# Patient Record
Sex: Female | Born: 1940 | Race: White | Hispanic: No | Smoking: Never smoker
Health system: Southern US, Community
[De-identification: ages and names within clinical notes are randomized; demographics above are authoritative.]

## PROBLEM LIST (undated history)

## (undated) DIAGNOSIS — M549 Dorsalgia, unspecified: Secondary | ICD-10-CM

## (undated) DIAGNOSIS — M353 Polymyalgia rheumatica: Secondary | ICD-10-CM

## (undated) DIAGNOSIS — F32A Depression, unspecified: Secondary | ICD-10-CM

## (undated) DIAGNOSIS — M1990 Unspecified osteoarthritis, unspecified site: Secondary | ICD-10-CM

## (undated) DIAGNOSIS — F319 Bipolar disorder, unspecified: Secondary | ICD-10-CM

## (undated) DIAGNOSIS — H04129 Dry eye syndrome of unspecified lacrimal gland: Secondary | ICD-10-CM

## (undated) DIAGNOSIS — R87619 Unspecified abnormal cytological findings in specimens from cervix uteri: Secondary | ICD-10-CM

## (undated) DIAGNOSIS — H9190 Unspecified hearing loss, unspecified ear: Secondary | ICD-10-CM

## (undated) DIAGNOSIS — I10 Essential (primary) hypertension: Secondary | ICD-10-CM

## (undated) DIAGNOSIS — F1910 Other psychoactive substance abuse, uncomplicated: Secondary | ICD-10-CM

## (undated) DIAGNOSIS — B009 Herpesviral infection, unspecified: Secondary | ICD-10-CM

## (undated) DIAGNOSIS — F329 Major depressive disorder, single episode, unspecified: Secondary | ICD-10-CM

## (undated) DIAGNOSIS — F419 Anxiety disorder, unspecified: Secondary | ICD-10-CM

## (undated) DIAGNOSIS — M810 Age-related osteoporosis without current pathological fracture: Secondary | ICD-10-CM

## (undated) HISTORY — DX: Age-related osteoporosis without current pathological fracture: M81.0

## (undated) HISTORY — PX: ABDOMINAL HYSTERECTOMY: SHX81

## (undated) HISTORY — DX: Herpesviral infection, unspecified: B00.9

## (undated) HISTORY — PX: TOTAL VAGINAL HYSTERECTOMY: SHX2548

## (undated) HISTORY — PX: FACIAL COSMETIC SURGERY: SHX629

## (undated) HISTORY — DX: Depression, unspecified: F32.A

## (undated) HISTORY — DX: Major depressive disorder, single episode, unspecified: F32.9

## (undated) HISTORY — PX: COLONOSCOPY: SHX174

## (undated) HISTORY — DX: Unspecified abnormal cytological findings in specimens from cervix uteri: R87.619

## (undated) HISTORY — DX: Polymyalgia rheumatica: M35.3

## (undated) HISTORY — DX: Essential (primary) hypertension: I10

## (undated) HISTORY — DX: Unspecified osteoarthritis, unspecified site: M19.90

## (undated) HISTORY — DX: Dry eye syndrome of unspecified lacrimal gland: H04.129

---

## 1998-06-16 ENCOUNTER — Ambulatory Visit (HOSPITAL_COMMUNITY): Admission: RE | Admit: 1998-06-16 | Discharge: 1998-06-16 | Payer: Self-pay | Admitting: Obstetrics and Gynecology

## 1999-01-20 ENCOUNTER — Encounter: Payer: Self-pay | Admitting: Obstetrics and Gynecology

## 1999-01-20 ENCOUNTER — Ambulatory Visit (HOSPITAL_COMMUNITY): Admission: RE | Admit: 1999-01-20 | Discharge: 1999-01-20 | Payer: Self-pay | Admitting: Obstetrics and Gynecology

## 1999-02-10 ENCOUNTER — Ambulatory Visit (HOSPITAL_COMMUNITY): Admission: RE | Admit: 1999-02-10 | Discharge: 1999-02-10 | Payer: Self-pay | Admitting: Gastroenterology

## 1999-07-21 ENCOUNTER — Encounter: Payer: Self-pay | Admitting: Obstetrics and Gynecology

## 1999-07-21 ENCOUNTER — Ambulatory Visit (HOSPITAL_COMMUNITY): Admission: RE | Admit: 1999-07-21 | Discharge: 1999-07-21 | Payer: Self-pay | Admitting: Obstetrics and Gynecology

## 1999-12-09 ENCOUNTER — Encounter: Payer: Self-pay | Admitting: Obstetrics and Gynecology

## 1999-12-09 ENCOUNTER — Encounter: Admission: RE | Admit: 1999-12-09 | Discharge: 1999-12-09 | Payer: Self-pay | Admitting: Obstetrics and Gynecology

## 2000-07-21 ENCOUNTER — Encounter: Payer: Self-pay | Admitting: Obstetrics and Gynecology

## 2000-07-21 ENCOUNTER — Ambulatory Visit (HOSPITAL_COMMUNITY): Admission: RE | Admit: 2000-07-21 | Discharge: 2000-07-21 | Payer: Self-pay | Admitting: Obstetrics and Gynecology

## 2001-01-29 ENCOUNTER — Other Ambulatory Visit: Admission: RE | Admit: 2001-01-29 | Discharge: 2001-01-29 | Payer: Self-pay | Admitting: Obstetrics and Gynecology

## 2001-07-27 ENCOUNTER — Ambulatory Visit (HOSPITAL_COMMUNITY): Admission: RE | Admit: 2001-07-27 | Discharge: 2001-07-27 | Payer: Self-pay | Admitting: Obstetrics and Gynecology

## 2001-07-27 ENCOUNTER — Encounter: Payer: Self-pay | Admitting: Obstetrics and Gynecology

## 2001-12-07 ENCOUNTER — Encounter: Payer: Self-pay | Admitting: Obstetrics and Gynecology

## 2001-12-07 ENCOUNTER — Encounter: Admission: RE | Admit: 2001-12-07 | Discharge: 2001-12-07 | Payer: Self-pay | Admitting: Obstetrics and Gynecology

## 2002-07-29 ENCOUNTER — Encounter: Payer: Self-pay | Admitting: Obstetrics and Gynecology

## 2002-07-29 ENCOUNTER — Ambulatory Visit (HOSPITAL_COMMUNITY): Admission: RE | Admit: 2002-07-29 | Discharge: 2002-07-29 | Payer: Self-pay | Admitting: Endocrinology

## 2003-08-24 ENCOUNTER — Inpatient Hospital Stay (HOSPITAL_COMMUNITY): Admission: EM | Admit: 2003-08-24 | Discharge: 2003-08-26 | Payer: Self-pay | Admitting: Emergency Medicine

## 2003-08-24 ENCOUNTER — Encounter: Payer: Self-pay | Admitting: Emergency Medicine

## 2003-08-25 ENCOUNTER — Encounter: Payer: Self-pay | Admitting: Internal Medicine

## 2003-08-27 ENCOUNTER — Ambulatory Visit (HOSPITAL_COMMUNITY): Admission: RE | Admit: 2003-08-27 | Discharge: 2003-08-27 | Payer: Self-pay | Admitting: *Deleted

## 2003-08-27 ENCOUNTER — Encounter: Payer: Self-pay | Admitting: *Deleted

## 2003-11-12 ENCOUNTER — Ambulatory Visit (HOSPITAL_COMMUNITY): Admission: RE | Admit: 2003-11-12 | Discharge: 2003-11-12 | Payer: Self-pay | Admitting: Gastroenterology

## 2003-12-22 ENCOUNTER — Encounter: Admission: RE | Admit: 2003-12-22 | Discharge: 2003-12-22 | Payer: Self-pay | Admitting: Obstetrics and Gynecology

## 2004-04-01 ENCOUNTER — Other Ambulatory Visit: Admission: RE | Admit: 2004-04-01 | Discharge: 2004-04-01 | Payer: Self-pay | Admitting: Obstetrics and Gynecology

## 2004-09-06 ENCOUNTER — Encounter: Admission: RE | Admit: 2004-09-06 | Discharge: 2004-09-06 | Payer: Self-pay | Admitting: Obstetrics and Gynecology

## 2005-10-31 ENCOUNTER — Encounter: Admission: RE | Admit: 2005-10-31 | Discharge: 2005-10-31 | Payer: Self-pay | Admitting: Obstetrics and Gynecology

## 2005-11-10 ENCOUNTER — Encounter: Admission: RE | Admit: 2005-11-10 | Discharge: 2005-11-10 | Payer: Self-pay | Admitting: Obstetrics and Gynecology

## 2006-11-06 ENCOUNTER — Encounter: Admission: RE | Admit: 2006-11-06 | Discharge: 2006-11-06 | Payer: Self-pay | Admitting: Obstetrics & Gynecology

## 2007-02-09 ENCOUNTER — Encounter: Admission: RE | Admit: 2007-02-09 | Discharge: 2007-02-09 | Payer: Self-pay | Admitting: *Deleted

## 2007-11-09 ENCOUNTER — Encounter: Admission: RE | Admit: 2007-11-09 | Discharge: 2007-11-09 | Payer: Self-pay | Admitting: Obstetrics and Gynecology

## 2008-01-02 ENCOUNTER — Other Ambulatory Visit: Admission: RE | Admit: 2008-01-02 | Discharge: 2008-01-02 | Payer: Self-pay | Admitting: Obstetrics and Gynecology

## 2008-01-24 ENCOUNTER — Encounter: Admission: RE | Admit: 2008-01-24 | Discharge: 2008-01-24 | Payer: Self-pay | Admitting: Obstetrics and Gynecology

## 2008-11-10 ENCOUNTER — Encounter: Admission: RE | Admit: 2008-11-10 | Discharge: 2008-11-10 | Payer: Self-pay | Admitting: Internal Medicine

## 2008-11-14 ENCOUNTER — Encounter: Admission: RE | Admit: 2008-11-14 | Discharge: 2008-11-14 | Payer: Self-pay | Admitting: Internal Medicine

## 2009-11-11 ENCOUNTER — Encounter: Admission: RE | Admit: 2009-11-11 | Discharge: 2009-11-11 | Payer: Self-pay | Admitting: Internal Medicine

## 2010-11-12 ENCOUNTER — Encounter: Admission: RE | Admit: 2010-11-12 | Discharge: 2010-11-12 | Payer: Self-pay | Admitting: Obstetrics and Gynecology

## 2011-01-16 ENCOUNTER — Encounter: Payer: Self-pay | Admitting: Internal Medicine

## 2011-05-13 NOTE — Discharge Summary (Signed)
NAME:  Christine Scott, Christine Scott                           ACCOUNT NO.:  [id number]   MEDICAL RECORD NO.:  [id number]                   PATIENT TYPE:  INP   LOCATION:  A204                                 FACILITY:  [facility]   PHYSICIAN:  Jackie Plum, M.D.             DATE OF BIRTH:  Jul 28, 1941   DATE OF ADMISSION:  08/24/2003  DATE OF DISCHARGE:                                 DISCHARGE SUMMARY   DISCHARGE DIAGNOSES:  1. Chest pain, resolved.     a. Serial cardiac enzymes negative for myocardial infarction.     b. A 2-D echocardiogram done on August 25, 2003 notable for normal size        left ventricle with normal systolic function, mild decrease in left        ventricular diastolic function, with evidence of E/A reversal in the        in the mitral outflow.  No wall motion abnormality.  No left        ventricular hypertrophy.  Normal right ventricular size with normal        systolic function.  Trace mitral insufficiency.  Normal pericardial        structures without any pericardial effusion.  Normal ascending aorta.     c. CT of the chest was negative for pulmonary embolus.  Carotid Doppler        was also unremarkable.     d. The patient is scheduled for outpatient Cardiolite on August 27, 2003 at 7:45 a.m.  Cardiology consult, Dr. Dorethea Clan, has given the        patient appropriate instructions regarding this test and she expresses        understanding including not to eat or drink after midnight tonight.  2. History of depression.  3. Osteoporosis.   REASON FOR ADMISSION:  Chest pain.   On the day of hospitalization the patient woke up with left-sided chest pain  which was radiating to the neck and left side of the back and left upper  extremity.  The patient passed out while she was in the bathroom for a few  seconds on the day of admission thereafter.  There was no history of nausea,  vomiting, or diaphoresis; however, she felt some slight shortness of breath.  DD, EKG,  and cardiac enzymes were unremarkable.  Admitting physical exam was  notable for hemodynamically stability with chest clear to auscultation and a  normal cardiac exam.  She did not have any extremity edema.  On account of  this, the patient was admitted for further evaluation of chest pain to rule  out myocardial infarction and further evaluation of her syncope.   HOSPITAL COURSE:  CHEST PAIN/SYNCOPE.  The patient was admitted to telemetry  bed.  There were no significant arrhythmias.  CT of the chest was done on  account of pleuritic component to her  chest pain which was unremarkable.  She had a carotid Doppler as part of her evaluation for her associated  syncope and it was negative.  Serial cardiac enzymes were negative for  myocardial infarction.  Cardiology was consulted and the patient was seen by  Dr. Dorethea Clan yesterday, and the patient was planned to be scheduled for a  stress test this morning; however, the patient ate and this has been  rescheduled.  The patient wants to go home today and cardiology and myself  have explained the risks of incomplete workup in the hospital and she  expresses understanding.   The cause of the patient's syncopal episode may be vasovagal reaction to her  pain possibly, but I am not sure.  No further evaluation at this point.  Follow-up with stress test as mentioned tomorrow.   CONSULTANTS:  Vida Roller, M.D. of cardiology.   PROCEDURES:  Not applicable.   CONDITION ON DISCHARGE:  Improved and stable.   DIET:  Cardiac diet.   DISCHARGE LABORATORY DATA:  Wbc 6.4, hemoglobin 11.9, hematocrit 35.9, MCV  87.1, platelet count 203.  ESR 221.  D-dimer 0.34.  Sodium 134, potassium  3.8, chloride 102, CO2 30, glucose 97, BUN 10, creatinine 0.8, calcium 8.9.  TSH 2.571.  Rheumatoid factor less than 20.   DISCHARGE MEDICATIONS:  1. Aspirin 81 p.o. daily.  2. Effexor 75 p.o. daily.  3. Protonix 40 daily.  4. Vicodin q.6h. p.r.n.  5. Ambien 5 mg  q.h.s. p.r.n.  6. Sublingual nitroglycerin p.r.n.                                               Jackie Plum, M.D.    GO/MEDQ  D:  08/26/2003  T:  08/26/2003  Job:  [id number]   cc:   Hanley Hays. Dechurch, M.D.  [address]  [place]  [place] [address]  Fax: [phone]

## 2011-05-13 NOTE — Consult Note (Signed)
NAME:  Christine Scott, Christine Scott NO.:  [id number]   MEDICAL RECORD NO.:  [id number]                   PATIENT TYPE:  INP   LOCATION:  A204                                 FACILITY:  [facility]   PHYSICIAN:  Vida Roller, M.D.                DATE OF BIRTH:  May 17, 1941   DATE OF CONSULTATION:  08/25/2003  DATE OF DISCHARGE:                                   CONSULTATION   CARDIOLOGY CONSUL ATION   PRIMARY CARE Andreyah Natividad:  Darius Bump[place]  She has no cardiology evaluations.   [name] is a 70 year old female with no significant past medical history  other than osteoporosis and depression.  She presented to the [facility] ER  with chest discomfort yesterday.  She states that the pain started about  7:15 in the morning and awoke her from sleep.  It was associated with mild  shortness of breath, localized in her left chest, very sharp and worsened  with deep inspiration.  The pain progressively got worse over the next 4-5  hours, to the point, where it was so severe she self-medicated herself with  Pepto-Bismol thinking that it was gastroesophageal reflux disease.  She went  into the bathroom. She had an episode of syncope witnessed by her husband  and was down for about 15 seconds.  She states that she started to see the  room start to close in on her.  Things got dark, she got sweaty, and then  she passed out.  Her husband laid her down on the bed, and awakened her from  the episode.  She was not postictal afterwards.  She was not incontinent to  bowel or bladder.  She did not have any tongue biting.  There was no  tonic/clonic movements associated with it and she was not postictal  afterwards able to speak in full sentences and moved all extremities.  She  was taken to the emergency department.  She got some sublingual  nitroglycerin which relieved the pain somewhat.  I believe that she also  received some other pain medication,  although it is not well documented.  She still has mild, nagging, left-sided discomfort which is worse when she  takes a deep breath.   PAST MEDICAL HISTORY:  Her past medical history is significant for  osteoporosis.   MEDICATIONS AT HOME:  She is previously on a medication that caused her to  get gastroesophageal reflux disease.  She was switched over, about 5 or 6  weeks ago, to Evista. She also has depression and she is status post a  hysterectomy about 35 years ago.  She was previously taking Celexa 20 mg a  day, and Evista 60 mg a day.   MEDICATIONS IN THE HOSPITAL:  1. Aspirin 81 mg.  2. Protonix 40 mg a day.  3. Evista 60 mg a day.  4. Effexor 75 mg a day.  5. Nitroglycerin as she needs it.  6. Lortab as she needs it.   SOCIAL HISTORY:  She lives in [place].  She is retired from the [employer]. She is married.  She lives with her husband.  She  has 2 daughters and 2 grandchildren all of whom are healthy.  Her mother  died at age 9 from complications of a hip fracture.  Father died at age 37  of a stroke.  She has 1 brother who had a cardiac event in his 80s and 2  other brothers with diabetes.   REVIEW OF SYSTEMS:  Her review of systems is essentially unremarkable except  for some mild left wrist pain.  She has no lower extremity pain.  She has  had no prolonged car travel.  She has had no lower extremity injuries.  She  does not have a blood clot history.   PHYSICAL EXAMINATION:  GENERAL: On physical exam she is a well-developed,  well-nourished, thin, white female in no apparent distress.  She is alert  and oriented x4.  VITAL SIGNS:  Blood pressure is 134/73.  Heart rate is 74 and sinus.  Respiratory rate is 18.  She is afebrile.  HEAD, EYES, EARS, NOSE, AND THROAT: Examination is unremarkable.  NECK:  The neck is supple.  There is no jugular venous distention or carotid  bruits.  CHEST:  Her chest is clear to auscultation.  She does have  some pleuritic  discomfort in her left chest when she takes a deep breath, but I hear no  pleural or pericardial friction rubs.  CARDIAC: Her heart sounds are easily heard.  There is no evidence of a third  or fourth heart sound.  She is regular. There is no murmur noted.  ABDOMEN:  Her abdomen is soft, nontender. Normoactive bowel sounds.  EXTREMITIES:  Lower extremities are without clubbing, cyanosis, or edema.  There is no Homan's sign.  Her pulses are 2+ throughout.  MUSCULOSKELETAL: Without significant deformities.  NEUROLOGIC: Without significant localization.   Electrocardiogram shows sinus rhythm at a rate of 79 with normal axes,  normal intervals, no ST-T wave changes concerning for ischemia. No Q waves  concerning for an old myocardial infarction.   LABORATORIES:  White blood cell count of 6.4, H&H of 12 and 36 with a  platelet count of 203,000.  Sodium of 137, potassium of 3.8, chloride of  102, bicarb of 30, BUN of 10, creatinine of 0.8, blood sugar of 97,  nonfasting.  Liver function studies are within normal limits.  Three sets of  cardiac enzymes are inconsistent with anterior myocardial infarction.  She  has a D-dimmer which shows 0.34.  Two liter blood gas shows a pH of 7.47,  pCO2 of 42, pO2 of 74, 95% saturation.   IMPRESSION:  So, we have a woman with chest discomfort which is very  atypical for coronary artery disease, but rather concerning for pulmonary  embolus.  A review of the literature shows that Evista actually is related  to deep venous thrombosis in recent clinical studies and may be one of the  most significant risk factors for using the drug.  She does have a family  history of coronary artery disease, but no other risk factors. She does not  smoke.  Her D-dimmer is abnormal, but not particularly so.  She has had a  set of carotid Dopplers which were reported to me as normal.  So,  I think, that it would be reasonable, at this point, to do an   echocardiogram to assess her left and right ventricular size. A spiral CT  has been ordered and I could not agree more with that recommendation.  Her  enzymes have been cycled.  She has not had an acute myocardial infarction or  electrocardiogram consistent with ischemia.  I would encourage the CT scan  to also be reviewed for possible aortic dissection. I think that if we run  out of diagnoses it might be reasonable to walk her on a treadmill with  imaging.  But, at this point that does not seem to be a likely thing.  I  will order that for the morning, but if the CT scan shows a pulmonary  embolus we can obviously cancel  that; it might also be reasonable.  I do not see a chest x-ray anywhere in  the chart.  I am sure that it was done.  I will need to review that, and  also there is consideration for starting anticoagulation. I think I would  have a low index of suspicion.  I would probably start her on Lovenox and  await base dose.                                               Vida Roller, M.D.    JH/MEDQ  D:  08/25/2003  T:  08/25/2003  Job:  [id number]

## 2011-05-13 NOTE — H&P (Signed)
NAME:  Christine Scott, Christine Scott                           ACCOUNT NO.:  [id number]   MEDICAL RECORD NO.:  [id number]                   PATIENT TYPE:  INP   LOCATION:  A204                                 FACILITY:  [facility]   PHYSICIAN:  Tesfaye D. Felecia Shelling, M.D.              DATE OF BIRTH:  06/22/1941   DATE OF ADMISSION:  08/24/2003  DATE OF DISCHARGE:                                HISTORY & PHYSICAL   PRIMARY CARE PHYSICIAN:  Darius Bump, M.D.   CHIEF COMPLAINT:  Chest pain.   HISTORY OF PRESENT ILLNESS:  This is a 70 year old white female with a  history of osteoporosis and depression disorder.  She came to the emergency  room with the above complaint.  The patient woke up this morning with left-  sided chest pain which was radiating to the neck, left side of the back and  the left upper extremity.  The pain was deep seated and persistent.  The  pain got worse on deep inspiration.  The patient thought that she had gas  pain and tried to take some Pepto-Bismol. However, the pain was not relieved  with the medication.  The patient passed out while she was in the bathroom  for a few seconds.  Then EMS was called and the patient was brought to the  emergency room.  The patient had no nausea, vomiting, or diaphoresis.  However, she felt slightly short of breath.  She got better after she was  given nitroglycerin and pain medication.  EKG and cardiac enzymes that were  done in the emergency room were within normal limits. However, the patient  was admitted under telemetry for further evaluation.   REVIEW OF SYSTEMS:  No headache, fever, cough, or nausea, or vomiting.  No  dysuria, urgency, or frequency of urination.   PAST MEDICAL HISTORY:  1. Osteoporosis.  2. Depression disorder.  3. Hysterectomy at the age of 16.   CURRENT MEDICATIONS:  1. Celexa 20 mg p.o. daily.  2. Evista 60 mg p.o. daily.   SOCIAL HISTORY:  The patient is married.  She is currently retired from  [employer]er, [employer].  The patient has no history of alcohol,  tobacco or substance abuse.   PHYSICAL EXAMINATION:  GENERAL: The patient is alert, awake, and pleasant  looking.  VITAL SIGNS:  Blood pressure 141/75, pulse 67, respiratory rate 18,  temperature 98 degrees Fahrenheit.  HEENT:  Pupils are equal and reactive.  NECK:  Supple.  CHEST:  Good air entry.  Clear lung fields.  CARDIOVASCULAR:  First and second heart sounds heard, no murmur, no gallop.  ABDOMEN:  Soft and lax. Bowel sounds are positive.  No masses, no  organomegaly.  EXTREMITIES:  No leg edema.   LABS ON ADMISSION:  ABG on room air pH 7.416, pCO2 of 42.4, pO2 of 74,  bicarbonate 26.7, and O2 saturation 95.2.  CBC: WBC  6.4, hemoglobin 11.9,  hematocrit 35.9, platelets 203.  Sodium 137, potassium 3.8, chloride 102,  CO2 30, glucose 97, BUN 10, creatinine 0.8, calcium 8.9.  CPK total 35,  CK/MB 1.2, and troponin 0.02.   ASSESSMENT:  1. Chest pain to rule out unstable angina.  2. Syncopal episode.  3. History of osteoporosis.  4. History of depression disorder.   PLAN:  1. Will do an EKG and cardiac enzymes x3.  2. Will continue the patient on telemetry.  3. Will do carotid Doppler.  4. Will do cardiology consult.  5. Will continue the patient on her regular medications.                                               Tesfaye D. Felecia Shelling, M.D.    TDF/MEDQ  D:  08/24/2003  T:  08/25/2003  Job:  [id number]

## 2011-05-13 NOTE — Procedures (Signed)
   NAME:  Christine Scott, Christine Scott                           ACCOUNT NO.:  [id number]   MEDICAL RECORD NO.:  [id number]                   PATIENT TYPE:  INP   LOCATION:  A204                                 FACILITY:  [facility]   PHYSICIAN:  Vida Roller, M.D.                DATE OF BIRTH:  1941-04-10   DATE OF PROCEDURE:  08/25/2003  DATE OF DISCHARGE:                                  ECHOCARDIOGRAM   TAPE NUMBER:  [id number]   TAPE COUNT:  [id number]   CLINICAL INFORMATION:  A 70 year old woman with chest pain, rule out  pericardial effusion.   TECHNICAL QUALITY:  Good.   M-MODE TRACINGS:  1. The aorta is 30 mm.  2. The left atrium is 42 mm.  3. The septum is 8 mm.  4. The posterior wall is 8 mm.  5. The left ventricular diastolic dimension is 50 mm.  6. The left ventricular systolic dimension is 30 mm.   2-D AND DOPPLER IMAGING:  1. The left ventricle is normal size with normal systolic function.  There     is mild decrement in left ventricular diastolic function with evidence of     E:A reversal in the mitral inflow.  There is also evidence of blunting of     the D wave in the pulmonary venous tracings consistent with grade 2     relaxation abnormality.  The wall motion is normal.  There is no evidence     of left ventricular hypertrophy.  2. The right ventricle is normal size with normal systolic function.  There     are no wall motion abnormalities seen.  3. Both atria appear to be top end of normal.  Left slightly greater than     right.  There is no obvious atrioseptal defect.  4. The aortic valve is trileaflet and tricommisural with no evidence of     stenosis or regurgitation.  5. The mitral valve is morphologically unremarkable with trace     insufficiency.  No stenosis is seen.  6. The tricuspid valve is morphologically unremarkable with no stenosis or     regurgitation.  7. The pulmonic valve is not well seen.  8. The pericardial structures are normal.  There is no pericardial  effusion.  9. The ascending aorta appears normal.  10.      The inferior vena cava appears normal.                                               Vida Roller, M.D.   JH/MEDQ  D:  08/25/2003  T:  08/25/2003  Job:  [id number]

## 2011-05-13 NOTE — Op Note (Signed)
NAME:  Christine Scott, Christine Scott                           ACCOUNT NO.:  [id number]   MEDICAL RECORD NO.:  [id number]                   PATIENT TYPE:  AMB   LOCATION:  ENDO                                 FACILITY:  [facility]   PHYSICIAN:  Danise Edge, M.D.                DATE OF BIRTH:  1941-09-24   DATE OF PROCEDURE:  11/12/2003  DATE OF DISCHARGE:                                 OPERATIVE REPORT   PROCEDURE:  Screening colonoscopy.   PROCEDURE INDICATION:  Christine Scott is a 71 year old female born Apr 30, 1941.  Christine Scott' brother was diagnosed with colon cancer in his 3s.  Christine Scott  underwent a screening colonoscopy in February 2000 which was normal.   ENDOSCOPIST:  Danise Edge, M.D.   PREMEDICATION:  Versed 5 mg, Demerol 40 mg.   DESCRIPTION OF PROCEDURE:  After obtaining informed consent, Christine Scott was  placed in the left lateral decubitus position.  I administered intravenous  Demerol and intravenous Versed to achieve conscious sedation for the  procedure.  The patient's blood pressure, oxygen saturation, and cardiac  rhythm were monitored throughout the procedure and documented in the medical  record.   Anal inspection was normal.  Digital rectal exam was normal.  The Olympus  adjustable pediatric colonoscope was introduced into the rectum and advanced  to the cecum.  Colonic preparation for the exam today was satisfactory.   Rectum normal.   Sigmoid colon and descending colon normal.   Splenic flexure normal.   Transverse colon normal.   Hepatic flexure normal.   Ascending colon normal.   Cecum and ileocecal valve normal.   ASSESSMENT:  Normal screening proctocolonoscopy to the cecum.   RECOMMENDATIONS:  Repeat colonoscopy in five years.                                               Danise Edge, M.D.    MJ/MEDQ  D:  11/12/2003  T:  11/12/2003  Job:  [id number]   cc:   Darius Bump, M.D.  [address]  [place] [address]  Fax: [phone]

## 2011-10-11 ENCOUNTER — Other Ambulatory Visit: Payer: Self-pay | Admitting: Internal Medicine

## 2011-10-12 ENCOUNTER — Other Ambulatory Visit: Payer: Self-pay | Admitting: Internal Medicine

## 2011-10-12 DIAGNOSIS — Z1231 Encounter for screening mammogram for malignant neoplasm of breast: Secondary | ICD-10-CM

## 2011-11-14 ENCOUNTER — Ambulatory Visit
Admission: RE | Admit: 2011-11-14 | Discharge: 2011-11-14 | Disposition: A | Discharge status: Home / Self Care | Payer: Federal, State, Local not specified - PPO | Source: Ambulatory Visit | Attending: Internal Medicine | Admitting: Internal Medicine

## 2011-11-14 DIAGNOSIS — Z1231 Encounter for screening mammogram for malignant neoplasm of breast: Secondary | ICD-10-CM

## 2011-11-21 ENCOUNTER — Other Ambulatory Visit: Payer: Self-pay | Admitting: Internal Medicine

## 2011-11-21 DIAGNOSIS — R928 Other abnormal and inconclusive findings on diagnostic imaging of breast: Secondary | ICD-10-CM

## 2011-12-09 ENCOUNTER — Ambulatory Visit
Admission: RE | Admit: 2011-12-09 | Discharge: 2011-12-09 | Disposition: A | Discharge status: Home / Self Care | Payer: Federal, State, Local not specified - PPO | Source: Ambulatory Visit | Attending: Internal Medicine | Admitting: Internal Medicine

## 2011-12-09 DIAGNOSIS — R928 Other abnormal and inconclusive findings on diagnostic imaging of breast: Secondary | ICD-10-CM

## 2012-10-02 ENCOUNTER — Other Ambulatory Visit: Payer: Self-pay | Admitting: Internal Medicine

## 2012-10-02 DIAGNOSIS — Z1231 Encounter for screening mammogram for malignant neoplasm of breast: Secondary | ICD-10-CM

## 2012-11-14 ENCOUNTER — Ambulatory Visit
Admission: RE | Admit: 2012-11-14 | Discharge: 2012-11-14 | Disposition: A | Discharge status: Home / Self Care | Payer: Federal, State, Local not specified - PPO | Source: Ambulatory Visit | Attending: Internal Medicine | Admitting: Internal Medicine

## 2012-11-14 DIAGNOSIS — Z1231 Encounter for screening mammogram for malignant neoplasm of breast: Secondary | ICD-10-CM

## 2012-11-15 ENCOUNTER — Other Ambulatory Visit: Payer: Self-pay | Admitting: Obstetrics and Gynecology

## 2012-11-15 DIAGNOSIS — N63 Unspecified lump in unspecified breast: Secondary | ICD-10-CM

## 2012-11-30 ENCOUNTER — Ambulatory Visit
Admission: RE | Admit: 2012-11-30 | Discharge: 2012-11-30 | Disposition: A | Discharge status: Home / Self Care | Payer: Federal, State, Local not specified - PPO | Source: Ambulatory Visit | Attending: Obstetrics and Gynecology | Admitting: Obstetrics and Gynecology

## 2012-11-30 DIAGNOSIS — N63 Unspecified lump in unspecified breast: Secondary | ICD-10-CM

## 2014-12-26 HISTORY — PX: BREAST SURGERY: SHX581

## 2015-03-02 ENCOUNTER — Other Ambulatory Visit: Payer: Self-pay

## 2015-03-02 DIAGNOSIS — Z1231 Encounter for screening mammogram for malignant neoplasm of breast: Secondary | ICD-10-CM

## 2015-03-09 ENCOUNTER — Ambulatory Visit
Admission: RE | Admit: 2015-03-09 | Discharge: 2015-03-09 | Disposition: A | Discharge status: Home / Self Care | Payer: Federal, State, Local not specified - PPO | Source: Ambulatory Visit

## 2015-03-09 DIAGNOSIS — Z1231 Encounter for screening mammogram for malignant neoplasm of breast: Secondary | ICD-10-CM

## 2015-03-18 ENCOUNTER — Encounter: Payer: Self-pay | Admitting: Certified Nurse Midwife

## 2015-03-18 ENCOUNTER — Ambulatory Visit (INDEPENDENT_AMBULATORY_CARE_PROVIDER_SITE_OTHER): Payer: Federal, State, Local not specified - PPO | Admitting: Certified Nurse Midwife

## 2015-03-18 VITALS — BP 130/74 | HR 80 | Resp 20 | Ht 61.5 in | Wt 118.0 lb

## 2015-03-18 DIAGNOSIS — N952 Postmenopausal atrophic vaginitis: Secondary | ICD-10-CM | POA: Diagnosis not present

## 2015-03-18 DIAGNOSIS — Z01419 Encounter for gynecological examination (general) (routine) without abnormal findings: Secondary | ICD-10-CM | POA: Diagnosis not present

## 2015-03-18 MED ORDER — ESTROGENS, CONJUGATED 0.625 MG/GM VA CREA: 1.0000 | TOPICAL_CREAM | Freq: Every day | VAGINAL | Status: DC

## 2015-03-18 NOTE — Patient Instructions (Signed)

## 2015-03-18 NOTE — Progress Notes (Signed)
74 y.o. G52P2002 Widowed  Caucasian Fe here for annual exam. Menopausal  No HRT. Denies vaginal bleeding. Using premarin cream for vaginal dryness, working well. Ambulates well, still driving without difficulty.   Sees PCP for medication management, labs, all normal per patient.Rhuematology does other labs also and manages osteoporosis.. No other health issues today. Going to [place] and [place] this summer and will have visited all 50 states!!   No LMP recorded. Patient has had a hysterectomy.          Sexually active: No.  The current method of family planning is status post hysterectomy.    Exercising: Yes.    walking Smoker:  no  Health Maintenance: Pap:  01-29-01 neg MMG:  03-09-15 category b density,birads 1:neg Colonoscopy:  2008, scheduled for another BMD:   2015 osteoporosis using Prolia every 6 months with Rhuematology TDaP: 2011 Labs: none Self breast exam: done every 6 weeks   reports that she has never smoked. She does not have any smokeless tobacco history on file. She reports that she drinks about 3.6 oz of alcohol per week. She reports that she does not use illicit drugs.  Past Medical History  Diagnosis Date  . Depression   . Osteoporosis   . Hypertension   . Polymyalgia rheumatica   . Osteoarthritis   . Dry eye     Past Surgical History  Procedure Laterality Date  . Facial cosmetic surgery    . Total vaginal hysterectomy      Current Outpatient Prescriptions  Medication Sig Dispense Refill  . B Complex-Biotin-FA (B-COMPLEX PO) Take by mouth daily.    . Calcium Carbonate-Vitamin D (CALCIUM + D PO) Take by mouth.    . Cholecalciferol (VITAMIN D PO) Take 10,000 Int'l Units by mouth daily.    Marland Kitchen conjugated estrogens (PREMARIN) vaginal cream Place 1 Applicatorful vaginally daily.    . cycloSPORINE (RESTASIS) 0.05 % ophthalmic emulsion 1 drop 2 (two) times daily.    Marland Kitchen denosumab (PROLIA) 60 MG/ML SOLN injection Inject 60 mg into the skin every 6 (six) months. Administer  in upper arm, thigh, or abdomen    . lamoTRIgine (LAMICTAL) 100 MG tablet     . Levomilnacipran HCl ER 80 MG CP24     . LORazepam (ATIVAN) 1 MG tablet     . metoprolol succinate (TOPROL-XL) 50 MG 24 hr tablet Take 50 mg by mouth.    . Multiple Vitamins-Minerals (MULTIVITAMIN PO) Take by mouth.    . mupirocin ointment (BACTROBAN) 2 %     . Omega-3 Fatty Acids (FISH OIL PO) Take by mouth.     No current facility-administered medications for this visit.    Family History  Problem Relation Age of Onset  . Colon cancer Daughter   . Congestive Heart Failure Mother   . Stroke Mother   . Diabetes Father   . Colon cancer Brother   . Diabetes Brother   . Diabetes Brother   . Stroke Brother     ROS:  Pertinent items are noted in HPI.  Otherwise, a comprehensive ROS was negative.  Exam:   BP 130/74 mmHg  Pulse 80  Resp 20  Ht 5' 1.5" (1.562 m)  Wt 118 lb (53.524 kg)  BMI 21.94 kg/m2 Height: 5' 1.5" (156.2 cm) Ht Readings from Last 3 Encounters:  03/18/15 5' 1.5" (1.562 m)    General appearance: alert, cooperative and appears stated age Head: Normocephalic, without obvious abnormality, atraumatic Neck: no adenopathy, supple, symmetrical, trachea midline and thyroid  normal to inspection and palpation Lungs: clear to auscultation bilaterally Breasts: normal appearance, no masses or tenderness, No nipple retraction or dimpling, No nipple discharge or bleeding, No axillary or supraclavicular adenopathy Heart: regular rate and rhythm Abdomen: soft, non-tender; no masses,  no organomegaly Extremities: extremities normal, atraumatic, no cyanosis or edema Skin: Skin color, texture, turgor normal. No rashes or lesions Lymph nodes: Cervical, supraclavicular, and axillary nodes normal. No abnormal inguinal nodes palpated Neurologic: Grossly normal   Pelvic: External genitalia:  no lesions              Urethra:  normal appearing urethra with no masses, tenderness or lesions               Bartholin's and Skene's: normal                 Vagina: atrophic appearing vagina with normal color and discharge, no lesions              Cervix: absent              Pap taken: No. Bimanual Exam:  Uterus:  uterus absent              Adnexa: normal adnexa and no mass, fullness, tenderness               Rectovaginal: Confirms               Anus:  normal sphincter tone, no lesions  Chaperone present: Yes  A:  Well Woman with normal exam  Menopausal no HRT TVH with ovaries retained for bleeding  Atrophic Vaginitis using premarin cream with good success needs refill  Osteoporosis with Prolia use with Rheumatology  Hypertension with PCP management    P:   Reviewed health and wellness pertinent to exam  Aware of need to evaluate if vaginal bleeding  Rx Premarin cream see order. Discussed Coconut oil use on other days not using Premarin. Coupon card given.  Continue with PCP as indicated  Pap smear not taken today   counseled on breast self exam, mammography screening, adequate intake of calcium and vitamin D, diet and exercise, Kegel's exercises  return annually or prn  An After Visit Summary was printed and given to the patient.

## 2015-03-25 NOTE — Progress Notes (Signed)
Reviewed personally.  M. Suzanne Avrianna Smart, MD.  

## 2015-04-17 ENCOUNTER — Other Ambulatory Visit: Payer: Self-pay | Admitting: Gastroenterology

## 2015-04-21 ENCOUNTER — Ambulatory Visit: Payer: Self-pay | Admitting: Certified Nurse Midwife

## 2015-05-26 ENCOUNTER — Telehealth: Payer: Self-pay | Admitting: Obstetrics and Gynecology

## 2015-05-26 NOTE — Telephone Encounter (Signed)
Patient wanting to know when she's due for a bone density scan.

## 2015-05-26 NOTE — Telephone Encounter (Signed)
Spoke with patient. Advised per last OV note on 03/18/2015 with Verner Choleborah S. Leonard CNM  last BMD was performed in 2015. Advised will be due again in 2017. Patient is agreeable. Patient requesting month of imaging. Advised it appears she has been having these performed in November. Patient is agreeable.  Routing to provider for final review. Patient agreeable to disposition. Will close encounter.   Patient aware provider will review message and nurse will return call if any additional advice or change of disposition.

## 2015-07-13 ENCOUNTER — Encounter (HOSPITAL_COMMUNITY): Payer: Self-pay | Admitting: *Deleted

## 2015-07-20 ENCOUNTER — Ambulatory Visit (HOSPITAL_COMMUNITY)
Admission: RE | Admit: 2015-07-20 | Payer: Federal, State, Local not specified - PPO | Source: Ambulatory Visit | Admitting: Gastroenterology

## 2015-07-20 SURGERY — COLONOSCOPY WITH PROPOFOL
Anesthesia: Monitor Anesthesia Care

## 2016-04-01 ENCOUNTER — Ambulatory Visit: Payer: Federal, State, Local not specified - PPO | Admitting: Obstetrics and Gynecology

## 2016-04-13 ENCOUNTER — Encounter: Payer: Self-pay | Admitting: Obstetrics and Gynecology

## 2016-04-13 ENCOUNTER — Ambulatory Visit (INDEPENDENT_AMBULATORY_CARE_PROVIDER_SITE_OTHER): Payer: Federal, State, Local not specified - PPO | Admitting: Obstetrics and Gynecology

## 2016-04-13 VITALS — BP 148/76 | HR 60 | Resp 16 | Ht 61.5 in | Wt 118.8 lb

## 2016-04-13 DIAGNOSIS — R8290 Unspecified abnormal findings in urine: Secondary | ICD-10-CM

## 2016-04-13 DIAGNOSIS — M545 Low back pain, unspecified: Secondary | ICD-10-CM

## 2016-04-13 DIAGNOSIS — Z01419 Encounter for gynecological examination (general) (routine) without abnormal findings: Secondary | ICD-10-CM

## 2016-04-13 DIAGNOSIS — N952 Postmenopausal atrophic vaginitis: Secondary | ICD-10-CM

## 2016-04-13 LAB — POCT URINALYSIS DIPSTICK
BILIRUBIN UA: NEGATIVE
Glucose, UA: NEGATIVE
KETONES UA: NEGATIVE
Nitrite, UA: NEGATIVE
PH UA: 5
Protein, UA: NEGATIVE
RBC UA: NEGATIVE
Urobilinogen, UA: NEGATIVE

## 2016-04-13 MED ORDER — ESTROGENS, CONJUGATED 0.625 MG/GM VA CREA: 1.0000 | TOPICAL_CREAM | Freq: Every day | VAGINAL | Status: DC

## 2016-04-13 NOTE — Patient Instructions (Signed)

## 2016-04-13 NOTE — Progress Notes (Signed)
Patient ID: Christine Scott, female   DOB: December 14, 1941, 75 y.o.   MRN: [id number] 75 y.o. G77P2004 Widowed Caucasian female here for annual exam.    Uses vaginal estrogen cream for dryness.  Not sexually active.   Had a left breast mass excision in [place] area which was benign. Has been followed for years for a mass of the left breast.  Patient states it was a fatty growth.  Asking if she has a lump in her abdominal skin.  Using Prolia for osteoporosis. Rheumatologist prescribing.  Having back pain.   Has a new female friend.  3 grandchildren live in [place].   Returned from [place].   PCP:   Jenita Seashore, MD  No LMP recorded. Patient has had a hysterectomy.           Sexually active: No.  The current method of family planning is status post hysterectomy.   Ovaries remain.  Exercising: Yes.    walking. Smoker:  no  Health Maintenance: Pap:  2002 Neg History of abnormal Pap:  Yes, hx abnormal paps in her 30s and the reason for her hysterectomy per patient. MMG:  03-09-15 Denisty B/Neg/[facility]Rads1:/[facility] Colonoscopy:   BMD:  11/2015  Result: Osteoporosis on Prolia with Rheumatologist:[facility] TDaP:  2011 Gardasil:   N/A Screening Labs:  Hb today: PCP, Urine today: 1+WBCs   reports that she has never smoked. She has never used smokeless tobacco. She reports that she drinks about 0.6 oz of alcohol per week. She reports that she does not use illicit drugs.  Past Medical History  Diagnosis Date  . Depression   . Osteoporosis   . Hypertension   . Polymyalgia rheumatica (HCC)   . Dry eye   . Osteoarthritis     hands  . Abnormal Pap smear of cervix     --hx abnormal paps in her 30s and per patient this was reason for her Hysterectomy    Past Surgical History  Procedure Laterality Date  . Facial cosmetic surgery    . Total vaginal hysterectomy  age 66    --due to abnormal pap smears per patient    Current Outpatient Prescriptions  Medication Sig Dispense Refill  .  B Complex-Biotin-FA (B-COMPLEX PO) Take 1 tablet by mouth daily.     . Calcium Carbonate-Vitamin D3 (CALCIUM 600-D) 600-400 MG-UNIT TABS Take 1 tablet by mouth 2 (two) times daily.    Marland Kitchen conjugated estrogens (PREMARIN) vaginal cream Place 1 Applicatorful vaginally daily. 42.5 g 4  . cycloSPORINE (RESTASIS) 0.05 % ophthalmic emulsion 1 drop 2 (two) times daily.    Marland Kitchen denosumab (PROLIA) 60 MG/ML SOLN injection Inject 60 mg into the skin every 6 (six) months. Administer in upper arm, thigh, or abdomen    . ferrous sulfate 325 (65 FE) MG EC tablet Take 325 mg by mouth daily with breakfast.    . latanoprost (XALATAN) 0.005 % ophthalmic solution Place 1 drop into both eyes 3 (three) times daily.    Marland Kitchen LORazepam (ATIVAN) 1 MG tablet Take 1 mg by mouth at bedtime.     . metoprolol succinate (TOPROL-XL) 50 MG 24 hr tablet Take 50 mg by mouth every morning.     . Multiple Vitamins-Minerals (MULTIVITAMIN PO) Take 1 tablet by mouth daily.     . Omega-3 Fatty Acids (FISH OIL PO) Take 1 capsule by mouth daily.      No current facility-administered medications for this visit.    Family History  Problem Relation Age of Onset  .  Colon cancer Daughter   . Congestive Heart Failure Mother   . Stroke Mother   . Diabetes Father   . Colon cancer Brother   . Diabetes Brother   . Diabetes Brother   . Stroke Brother     ROS:  Pertinent items are noted in HPI.  Otherwise, a comprehensive ROS was negative.  Exam:   BP 148/76 mmHg  Pulse 60  Resp 16  Ht 5' 1.5" (1.562 m)  Wt 118 lb 12.8 oz (53.887 kg)  BMI 22.09 kg/m2    General appearance: alert, cooperative and appears stated age Head: Normocephalic, without obvious abnormality, atraumatic Neck: no adenopathy, supple, symmetrical, trachea midline and thyroid normal to inspection and palpation Lungs: clear to auscultation bilaterally Breasts: normal appearance, no masses or tenderness, Inspection negative, No nipple retraction or dimpling, No nipple  discharge or bleeding, No axillary or supraclavicular adenopathy, scar of the left breast.  Heart: regular rate and rhythm Abdomen: incisions:  No.    , soft, non-tender; no specific mass, subcutaneous adipose tissue palpable, no organomegaly Extremities: extremities normal, atraumatic, no cyanosis or edema Skin: Skin color, texture, turgor normal. No rashes or lesions Lymph nodes: Cervical, supraclavicular, and axillary nodes normal. No abnormal inguinal nodes palpated Neurologic: Grossly normal  Pelvic: External genitalia:  no lesions              Urethra:  normal appearing urethra with no masses, tenderness or lesions              Bartholins and Skenes: normal                 Vagina: normal appearing vagina with normal color and discharge, no lesions              Cervix: absent              Pap taken: Yes.   Bimanual Exam:  Uterus:  uterus absent              Adnexa: no mass, fullness, tenderness              Rectal exam: Yes.  .  Confirms.              Anus:  normal sphincter tone, no lesions  Chaperone was present for exam.  Assessment:   Well woman visit with normal exam. Status post TVH due to abnormal paps.  Status post left breast biopsy.  Benign per patient.  Osteoporosis.  On Prolia.  Abnormal urine.  Low back pain.  Vaginal atrophy.   Plan: Yearly mammogram recommended after age 75.  [facility] Recommended self breast exam.  Pap and HR HPV as above. Discussed Calcium, Vitamin D, regular exercise program including cardiovascular and weight bearing exercise. Labs performed.  Yes.  .   See orders.  Urine micro and culture.  Prescription medication(s) given.  Yes.  .  See orders.  Premarin vaginal cream 1/2 gram pv at hs 2 times per week.  Discussed risks of DVT, PE, MI, stroke, and breast cancer. Will get report from breast biopsy. Follow up annually and prn.       After visit summary provided.

## 2016-04-14 ENCOUNTER — Other Ambulatory Visit: Payer: Self-pay

## 2016-04-14 DIAGNOSIS — Z1231 Encounter for screening mammogram for malignant neoplasm of breast: Secondary | ICD-10-CM

## 2016-04-14 LAB — URINALYSIS, MICROSCOPIC ONLY
BACTERIA UA: NONE SEEN [HPF]
CASTS: NONE SEEN [LPF]
CRYSTALS: NONE SEEN [HPF]
RBC / HPF: NONE SEEN RBC/HPF (ref <=2)
YEAST: NONE SEEN [HPF]

## 2016-04-14 LAB — URINE CULTURE: Colony Count: 50000

## 2016-04-14 LAB — IPS PAP SMEAR ONLY

## 2016-04-22 ENCOUNTER — Telehealth: Payer: Self-pay | Admitting: Obstetrics and Gynecology

## 2016-04-22 NOTE — Telephone Encounter (Signed)
Patient had studies at different locations, meaning different machines were used.  The hip looks improved.  The spine looks one tenth of a point lower this time.   Please have her contact her rheumatologist for recommendations and date for the next Prolia.  Our office is not prescribing this.   Thanks.

## 2016-04-22 NOTE — Telephone Encounter (Signed)
Spoke with patient. Advised of message as seen below from Dr.Silva. She is agreeable and verbalizes understanding. Reports she is getting her Prolia injections done with her PCP. Was contacted by their office and is in the process of scheduling her next injection.  Routing to provider for final review. Patient agreeable to disposition. Will close encounter.

## 2016-04-22 NOTE — Telephone Encounter (Signed)
Call to [facility] to request most recent BMD results from 11/2015 for review and comparison from results in 2012. Per Dr.Silva's aex note on 04/13/2016 the patient is receiving her Prolia through her Rheumatologist. We do not have her last injection date on file. She will need to contact her Rheumatologist regarding when next injection is due.

## 2016-04-22 NOTE — Telephone Encounter (Signed)
Patient is calling with questions regarding her past BMD results. Patient is asking if there was any improvement with her most recent BMD. Patient is also asking when she is due for her next Prolia.

## 2016-04-22 NOTE — Telephone Encounter (Signed)
Bone Density report from 11/25/2015 to Dr.Silva for review and comparison to BMD from 2012.

## 2016-04-27 ENCOUNTER — Ambulatory Visit
Admission: RE | Admit: 2016-04-27 | Discharge: 2016-04-27 | Disposition: A | Discharge status: Home / Self Care | Payer: Federal, State, Local not specified - PPO | Source: Ambulatory Visit

## 2016-04-27 DIAGNOSIS — Z1231 Encounter for screening mammogram for malignant neoplasm of breast: Secondary | ICD-10-CM

## 2016-07-04 ENCOUNTER — Other Ambulatory Visit: Payer: Self-pay | Admitting: Certified Nurse Midwife

## 2016-07-05 NOTE — Telephone Encounter (Signed)
Medication refill request: PREMARIN vaginal cream Last AEX:  04/13/16 Dr. Edward JollySilva Next AEX: 04/19/17 Last MMG (if hormonal medication request): 04/27/16 [id number] negative Refill authorized: 04/13/16 #42.5g w/1 refill; today #42.5g w/1 refill?

## 2016-12-22 NOTE — Progress Notes (Signed)
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 female with history of him sicca symptoms and polymyalgia rheumatica. She states her sicca symptoms are better. She denies any increased muscle weakness. She states yesterday she missed her chair and landed on the floor she's been having lower back pain since then. She is having difficulty getting up from chair due to that. He is localized to the right SI joint area and it does not radiate anywhere. She has continues to have discomfort in her hands and feet she has noticed increase knobbiness in her hands over her DIP joints. Her right trochanteric bursa is a still painful. She states she could not go for physical therapy. She's been also struggling with hypertension. She was recently started on amlodipine 2.5 mg by mouth daily. Despite of that she continues to have elevation of blood pressure.   Activities of Daily Living:  Patient reports morning stiffness for 15-3- minutes.   Patient Reports nocturnal pain.  Difficulty dressing/grooming: Denies Difficulty climbing stairs: Reports Difficulty getting out of chair: Reports Difficulty using hands for taps, buttons, cutlery, and/or writing: Denies   Review of Systems  Constitutional: Negative for fatigue, night sweats, weight gain, weight loss and weakness.  HENT: Positive for mouth dryness. Negative for mouth sores, trouble swallowing, trouble swallowing and nose dryness.   Eyes: Positive for dryness. Negative for pain, redness and visual disturbance.  Respiratory: Negative for cough, shortness of breath and difficulty breathing.   Cardiovascular: Positive for hypertension. Negative for chest  pain, palpitations, irregular heartbeat and swelling in legs/feet.  Gastrointestinal: Negative for blood in stool, constipation and diarrhea.  Endocrine: Negative for increased urination.  Genitourinary: Negative for vaginal dryness.  Musculoskeletal: Positive for arthralgias, joint pain and morning stiffness. Negative for joint swelling, myalgias, muscle weakness, muscle tenderness and myalgias.  Skin: Negative for color change, rash, hair loss, skin tightness, ulcers and sensitivity to sunlight.  Allergic/Immunologic: Negative for susceptible to infections.  Neurological: Negative for dizziness, memory loss and night sweats.  Hematological: Negative for swollen glands.  Psychiatric/Behavioral: Negative for depressed mood and sleep disturbance. The patient is nervous/anxious.     PMFS History:  Patient Active Problem List   Diagnosis Date Noted  . Sjogren's syndrome 12/25/2016  . 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 Primary osteoarthritis of both feet 12/25/2016  . Osteoporosis 12/25/2016    Past Medical History:  Diagnosis Date  . Abnormal Pap smear of cervix    --hx abnormal paps in her 30s and per patient this was reason for her Hysterectomy  . Depression   . Dry eye   . Hypertension   . Osteoarthritis    hands  . Osteoporosis   . Polymyalgia rheumatica (HCC)     Family History  Problem Relation Age of Onset  . Colon cancer Daughter   . Congestive Heart Failure Mother   . Stroke Mother   . Diabetes Father   . Colon cancer Brother   . Diabetes Brother   .  Diabetes Brother   . Stroke Brother    Past Surgical History:  Procedure Laterality Date  . BREAST SURGERY  2016   [place]  Marland Kitchen. FACIAL COSMETIC SURGERY    . TOTAL VAGINAL HYSTERECTOMY  age 75   --due to abnormal pap smears per patient   Social History   Social History Narrative  . No narrative on file      Objective: Vital Signs: BP (!) 180/80 (BP Location: Left Arm)   Pulse 88   Resp 12   Ht 5\' 1"  (1.549 m)   Wt 126 lb (57.2 kg)   BMI 23.81 kg/m    Physical Exam  Constitutional: She is oriented to person, place, and time. She appears well-developed and well-nourished.  HENT:  Head: Normocephalic and atraumatic.  Eyes: Conjunctivae and EOM are normal.  Neck: Normal range of motion.  Cardiovascular: Normal rate, regular rhythm, normal heart sounds and intact distal pulses.   Pulmonary/Chest: Effort normal and breath sounds normal.  Abdominal: Soft. Bowel sounds are normal.  Lymphadenopathy:    She has no cervical adenopathy.  Neurological: She is alert and oriented to person, place, and time.  Skin: Skin is warm and dry. Capillary refill takes less than 2 seconds.  Psychiatric: She has a normal mood and affect. Her behavior is normal.  Nursing note and vitals reviewed.    Musculoskeletal Exam: C-spine and thoracic lumbar spine good range of motion she has some tenderness over sacral and coccygeal region especially on the right side. Shoulder joints elbow joints wrist joints are good range of motion she has thickening of bilateral CMC PIP/DIP joints with no synovitis consistent with osteoarthritis. Hip joints knee joints ankles MTPs PIPs with good range of motion with no synovitis.  CDAI Exam: No CDAI exam completed.    Investigation: Findings:  Sjogren's.  10/20/2015 Positive ANA titer 1:40, positive  Ro,  C3 low 54 , C4 normal     Imaging: Xr Sacrum/coccyx  Result Date: 12/27/2016 No acute sacral or coccyx fracture was noted . She has L5-S1 listhesis. It is difficult for me to say if it's acute or chronic. Impression:L4-5 L5-S1 narrowing and L5-S1 listhesis. No acute fracture noted.   Xr Pelvis 1-2 Views  Result Date: 12/27/2016 SI joints intact. No acute fractures were noted. Impression: No fracture noted.   Speciality Comments: No specialty comments  available.    Procedures:  No procedures performed Allergies: Patient has no known allergies.   Assessment / Plan:     Visit Diagnoses: Sjogren's syndrome - Positive ANA titer 1:40, positive  Ro,  C3 low 54 , C4 normal on 10/20/2015: She continues to have mild sicca symptoms which are controlled with over-the-counter products.  Polymyalgia rheumatica (HCC) - In remission  Primary osteoarthritis of both hands: Joint protection and muscle strengthening were discussed.  Primary osteoarthritis of both feet: Proper fitting shoes were discussed.  Osteoporosis - On Prolia, November 2016 DEXA T score -2.8 lumbar spine  Trochanteric bursitis of right hip: She continues to have discomfort and requests a note for physical therapy. I will give her a prescription for that today.  Essential hypertension: Her blood pressure is a still very elevated have advised her to contact her PCP.  Depression  Acute midline low back pain without sciatica , the pain is started after the fall yesterday she is having difficulty walking and sitting. I will obtain following x-ray today.- Plan: XR Sacrum/Coccyx, XR Pelvis 1-2 Views    Orders:  Orders Placed This Encounter  Procedures  . XR Sacrum/Coccyx  . XR Pelvis 1-2 Views   No orders of the defined types were placed in this encounter.   Face-to-face time spent with patient was 30 minutes. 50% of time was spent in counseling and coordination of care.  Follow-Up Instructions: Return in about 6 months (around 06/26/2017) for Osteoarthritis, Sjogren's.   Pollyann Savoy, MD

## 2016-12-25 DIAGNOSIS — M353 Polymyalgia rheumatica: Secondary | ICD-10-CM | POA: Insufficient documentation

## 2016-12-25 DIAGNOSIS — M19041 Primary osteoarthritis, right hand: Secondary | ICD-10-CM | POA: Insufficient documentation

## 2016-12-25 DIAGNOSIS — M19072 Primary osteoarthritis, left ankle and foot: Secondary | ICD-10-CM

## 2016-12-25 DIAGNOSIS — M19042 Primary osteoarthritis, left hand: Secondary | ICD-10-CM | POA: Insufficient documentation

## 2016-12-25 DIAGNOSIS — M810 Age-related osteoporosis without current pathological fracture: Secondary | ICD-10-CM | POA: Insufficient documentation

## 2016-12-25 DIAGNOSIS — M19071 Primary osteoarthritis, right ankle and foot: Secondary | ICD-10-CM | POA: Insufficient documentation

## 2016-12-25 DIAGNOSIS — M3501 Sicca syndrome with keratoconjunctivitis: Secondary | ICD-10-CM | POA: Insufficient documentation

## 2016-12-27 ENCOUNTER — Ambulatory Visit (INDEPENDENT_AMBULATORY_CARE_PROVIDER_SITE_OTHER): Payer: Self-pay

## 2016-12-27 ENCOUNTER — Encounter: Payer: Self-pay | Admitting: Rheumatology

## 2016-12-27 ENCOUNTER — Ambulatory Visit (INDEPENDENT_AMBULATORY_CARE_PROVIDER_SITE_OTHER): Payer: Federal, State, Local not specified - PPO | Admitting: Rheumatology

## 2016-12-27 VITALS — BP 180/80 | HR 88 | Resp 12 | Ht 61.0 in | Wt 126.0 lb

## 2016-12-27 DIAGNOSIS — F339 Major depressive disorder, recurrent, unspecified: Secondary | ICD-10-CM | POA: Diagnosis not present

## 2016-12-27 DIAGNOSIS — M545 Low back pain, unspecified: Secondary | ICD-10-CM

## 2016-12-27 DIAGNOSIS — M7061 Trochanteric bursitis, right hip: Secondary | ICD-10-CM

## 2016-12-27 DIAGNOSIS — M810 Age-related osteoporosis without current pathological fracture: Secondary | ICD-10-CM | POA: Diagnosis not present

## 2016-12-27 DIAGNOSIS — M19042 Primary osteoarthritis, left hand: Secondary | ICD-10-CM

## 2016-12-27 DIAGNOSIS — M353 Polymyalgia rheumatica: Secondary | ICD-10-CM

## 2016-12-27 DIAGNOSIS — M3501 Sicca syndrome with keratoconjunctivitis: Secondary | ICD-10-CM

## 2016-12-27 DIAGNOSIS — M19041 Primary osteoarthritis, right hand: Secondary | ICD-10-CM

## 2016-12-27 DIAGNOSIS — M19072 Primary osteoarthritis, left ankle and foot: Secondary | ICD-10-CM

## 2016-12-27 DIAGNOSIS — I10 Essential (primary) hypertension: Secondary | ICD-10-CM | POA: Diagnosis not present

## 2016-12-27 DIAGNOSIS — M19071 Primary osteoarthritis, right ankle and foot: Secondary | ICD-10-CM | POA: Diagnosis not present

## 2017-04-13 ENCOUNTER — Other Ambulatory Visit: Payer: Self-pay | Admitting: Family Medicine

## 2017-04-13 DIAGNOSIS — Z1231 Encounter for screening mammogram for malignant neoplasm of breast: Secondary | ICD-10-CM

## 2017-04-13 NOTE — Progress Notes (Signed)
76 y.o. G21P2004 Widowed Caucasian female here for annual exam.    States her neck feel "sensitive" to the touch.  No sore throat.  No problems swallowing.  Feels cold often.   Just started Lamictal for depression. Is in counseling, Dr. Colon Branch who works with Dr. Jennelle Human.  Using vaginal estrogen cream for atrophy.  Using Premarin vaginal cream.  Using a pea size amount daily.   Just seen 2 months ago by new PCP.   PCP:  Tally Joe, MD Rheumatology:  Dr. Cherlyn Labella.   No LMP recorded. Patient has had a hysterectomy.       Sexually active: No. female The current method of family planning is status post hysterectomy--ovaries remain.    Exercising: Yes.    Walks 2 miles--5days a week Smoker:  no  Health Maintenance: Pap: 04-13-16 Neg; 2002 Neg History of abnormal Pap:  Yes, hx abnormal paps in her 30s and the reason for her hysterectomy per patient. MMG: 04-27-16 Density C/Neg/BiRads1:TBC.  Has an appointment.  Colonoscopy: approximately 2015 normal with Dr.Johnson--no further needed. BMD: 11-15-15  Result: Osteoporosis:TBC. On Prolia with Rheumatologist TDaP:  2011 Gardasil:   no HIV:no Hep C:no Screening Labs:  Hb today: Rheumatologist/PCP, Urine today: not done   reports that she has never smoked. She has never used smokeless tobacco. She reports that she drinks about 0.6 oz of alcohol per week . She reports that she does not use drugs.  Past Medical History:  Diagnosis Date  . Abnormal Pap smear of cervix    --hx abnormal paps in her 30s and per patient this was reason for her Hysterectomy  . Depression   . Dry eye   . Hypertension   . Osteoarthritis    hands  . Osteoporosis   . Polymyalgia rheumatica (HCC)     Past Surgical History:  Procedure Laterality Date  . BREAST SURGERY  2016   benign mass removed in [place], [place].  Marland Kitchen FACIAL COSMETIC SURGERY    . TOTAL VAGINAL HYSTERECTOMY  age 83   --due to abnormal pap smears per patient    Current Outpatient  Prescriptions  Medication Sig Dispense Refill  . amLODipine (NORVASC) 10 MG tablet Take 1 tablet by mouth daily.    . B Complex-Biotin-FA (B-COMPLEX PO) Take 1 tablet by mouth daily.     . Calcium Carbonate-Vitamin D3 (CALCIUM 600-D) 600-400 MG-UNIT TABS Take 1 tablet by mouth 2 (two) times daily.    Marland Kitchen conjugated estrogens (PREMARIN) vaginal cream Place 1/2 gram per vagina at bedtime twice weekly. 30 g 1  . cycloSPORINE (RESTASIS) 0.05 % ophthalmic emulsion 1 drop 2 (two) times daily.    Marland Kitchen denosumab (PROLIA) 60 MG/ML SOLN injection Inject 60 mg into the skin every 6 (six) months. Administer in upper arm, thigh, or abdomen    . ferrous sulfate 325 (65 FE) MG EC tablet Take 325 mg by mouth daily with breakfast.    . lamoTRIgine (LAMICTAL) 25 MG tablet Take 1 tablet by mouth daily. Tapering up to 2 tablets daily    . latanoprost (XALATAN) 0.005 % ophthalmic solution Place 1 drop into both eyes 3 (three) times daily.    Marland Kitchen LORazepam (ATIVAN) 1 MG tablet Take 1 mg by mouth at bedtime.     . metoprolol succinate (TOPROL-XL) 50 MG 24 hr tablet Take 50 mg by mouth every morning.     . Multiple Vitamins-Minerals (MULTIVITAMIN PO) Take 1 tablet by mouth daily.     . Omega-3 Fatty Acids (  FISH OIL PO) Take 1 capsule by mouth daily.      No current facility-administered medications for this visit.     Family History  Problem Relation Age of Onset  . Congestive Heart Failure Mother   . Stroke Mother   . Diabetes Father   . Colon cancer Brother   . Diabetes Brother   . Diabetes Brother   . Stroke Brother   . Colon cancer Daughter     ROS:  Pertinent items are noted in HPI.  Otherwise, a comprehensive ROS was negative.  Exam:   BP 138/62 (BP Location: Right Arm, Patient Position: Sitting, Cuff Size: Normal)   Pulse 60   Resp 16   Ht 5' 1.5" (1.562 m)   Wt 124 lb 6.4 oz (56.4 kg)   BMI 23.12 kg/m     General appearance: alert, cooperative and appears stated age Head: Normocephalic, without  obvious abnormality, atraumatic Neck: no adenopathy, supple, symmetrical, trachea midline and thyroid normal to inspection and palpation Lungs: clear to auscultation bilaterally Breasts: normal appearance, no masses or tenderness, No nipple retraction or dimpling, No nipple discharge or bleeding, No axillary or supraclavicular adenopathy Heart: regular rate and rhythm Abdomen: soft, non-tender; no masses, no organomegaly Extremities: extremities normal, atraumatic, no cyanosis or edema Skin: Skin color, texture, turgor normal. No rashes or lesions Lymph nodes: Cervical, supraclavicular, and axillary nodes normal. No abnormal inguinal nodes palpated Neurologic: Grossly normal  Pelvic: External genitalia:  no lesions              Urethra:  normal appearing urethra with no masses, tenderness or lesions              Bartholins and Skenes: normal                 Vagina: normal appearing vagina with normal color and discharge, no lesions              Cervix:  Absent.              Pap taken: No. Bimanual Exam:  Uterus:   Absent.               Adnexa: no mass, fullness, tenderness              Rectal exam: Yes.  .  Confirms.              Anus:  normal sphincter tone, no lesions  Chaperone was present for exam.  Assessment:   Well woman visit with normal exam. Status post TVH due to abnormal paps.  Status post left breast biopsy.  Benign per patient.  Osteoporosis.  On Prolia.  Vaginal atrophy.  Neck sensitivity.  Cold intolerance.  Depression. On Lamictal.   Plan: Mammogram screening discussed.  We discussed 3D mammogram. Recommended self breast awareness. Pap and HR HPV as above. Guidelines for Calcium, Vitamin D, regular exercise program including cardiovascular and weight bearing exercise. Prolia per rheumatologist.  Ok to continue Premarin vaginal cream.   Discussed possible increased risk of breast cancer.  She will contact her PCP to see if she has had thyroid testing.  If  neck/throat discomfort persists, to PCP.  Follow up annually and prn.         After visit summary provided.

## 2017-04-19 ENCOUNTER — Encounter: Payer: Self-pay | Admitting: Obstetrics and Gynecology

## 2017-04-19 ENCOUNTER — Ambulatory Visit (INDEPENDENT_AMBULATORY_CARE_PROVIDER_SITE_OTHER): Payer: Federal, State, Local not specified - PPO | Admitting: Obstetrics and Gynecology

## 2017-04-19 VITALS — BP 138/62 | HR 60 | Resp 16 | Ht 61.5 in | Wt 124.4 lb

## 2017-04-19 DIAGNOSIS — Z01419 Encounter for gynecological examination (general) (routine) without abnormal findings: Secondary | ICD-10-CM | POA: Diagnosis not present

## 2017-04-19 MED ORDER — ESTROGENS, CONJUGATED 0.625 MG/GM VA CREA: TOPICAL_CREAM | VAGINAL | 1 refills | Status: DC

## 2017-04-19 NOTE — Patient Instructions (Signed)

## 2017-04-28 ENCOUNTER — Ambulatory Visit: Payer: Federal, State, Local not specified - PPO

## 2017-05-01 ENCOUNTER — Ambulatory Visit (INDEPENDENT_AMBULATORY_CARE_PROVIDER_SITE_OTHER): Payer: Federal, State, Local not specified - PPO | Admitting: Orthopaedic Surgery

## 2017-05-01 ENCOUNTER — Ambulatory Visit (INDEPENDENT_AMBULATORY_CARE_PROVIDER_SITE_OTHER): Payer: Self-pay

## 2017-05-01 DIAGNOSIS — M25561 Pain in right knee: Secondary | ICD-10-CM | POA: Diagnosis not present

## 2017-05-01 MED ORDER — DICLOFENAC SODIUM 75 MG PO TBEC: 75.0000 mg | DELAYED_RELEASE_TABLET | Freq: Two times a day (BID) | ORAL | 2 refills | Status: DC

## 2017-05-01 NOTE — Progress Notes (Signed)
Office Visit Note   Patient: Christine Scott           Date of Birth: 1941-10-07           MRN: [id number] Visit Date: 05/01/2017              Requested by: Tally Joe, MD [phone] [address] [place], [place] [address] PCP: Tally Joe, MD   Assessment & Plan: Visit Diagnoses:  1. Acute pain of right knee     Plan: Impression is arthritis flareup. I did offer injection which she declined. I gave her prescription for diclofenac. Do want her to not walk for now until she feels better. Could consider injection if she is not better.  Follow-Up Instructions: Return if symptoms worsen or fail to improve.   Orders:  Orders Placed This Encounter  Procedures  . XR Knee 1-2 Views Right   Meds ordered this encounter  Medications  . diclofenac (VOLTAREN) 75 MG EC tablet    Sig: Take 1 tablet (75 mg total) by mouth 2 (two) times daily.    Dispense:  30 tablet    Refill:  2      Procedures: No procedures performed   Clinical Data: No additional findings.   Subjective: Chief Complaint  Patient presents with  . Right Knee - Pain    Patient is a 76 year old female who comes in with right knee pain. He denies. She denies any injuries. She does walk up to 5 miles knee. She's complaining mainly right knee pain for about the leg and down the leg. 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 This is managed by Dr. Corliss Skains. She also has scoliosis.    Review of Systems  Constitutional: Negative.   HENT: Negative.   Eyes: Negative.   Respiratory: Negative.   Cardiovascular: Negative.   Endocrine: Negative.   Musculoskeletal: Negative.   Neurological: Negative.   Hematological: Negative.   Psychiatric/Behavioral: Negative.   All other systems reviewed and are negative.    Objective: Vital Signs: There were no vitals taken for this visit.  Physical Exam  Constitutional: She is oriented to person, place, and time. She appears well-developed and  well-nourished.  HENT:  Head: Normocephalic and atraumatic.  Eyes: EOM are normal.  Neck: Neck supple.  Pulmonary/Chest: Effort normal.  Abdominal: Soft.  Neurological: She is alert and oriented to person, place, and time.  Skin: Skin is warm. Capillary refill takes less than 2 seconds.  Psychiatric: She has a normal mood and affect. Her behavior is normal. Judgment and thought content normal.  Nursing note and vitals reviewed.   Ortho Exam Right knee exam shows no joint effusion. He has excellent range of motion. She has nonspecific pain throughout the knee. Specialty Comments:  No specialty comments available.  Imaging: Xr Knee 1-2 Views Right  Result Date: 05/01/2017 No significant degenerative joint disease    PMFS History: Patient Active Problem List   Diagnosis Date Noted  . Acute pain of right knee 05/01/2017  . Sjogren's syndrome 12/25/2016  . 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 Primary osteoarthritis of both feet 12/25/2016  . Osteoporosis 12/25/2016   Past Medical History:  Diagnosis Date  . Abnormal Pap smear of cervix    --hx abnormal paps in her 30s and per patient this was reason for her Hysterectomy  . Depression   . Dry eye   . Hypertension   . Osteoarthritis  hands  . Osteoporosis   . Polymyalgia rheumatica (HCC)     Family History  Problem Relation Age of Onset  . Congestive Heart Failure Mother   . Stroke Mother   . Diabetes Father   . Colon cancer Brother   . Diabetes Brother   . Diabetes Brother   . Stroke Brother   . Colon cancer Daughter     Past Surgical History:  Procedure Laterality Date  . BREAST SURGERY  2016   [place]  Marland Kitchen. FACIAL COSMETIC SURGERY    . TOTAL VAGINAL HYSTERECTOMY  age 76   --due to abnormal pap smears per patient   Social History   Occupational History  . Not on file.   Social History Main Topics  . Smoking status:  Never Smoker  . Smokeless tobacco: Never Used  . Alcohol use 0.6 oz/week    1 Standard drinks or equivalent per week     Comment: occasional--2 glasses of wine/month  . Drug use: No  . Sexual activity: No     Comment: TVH

## 2017-05-02 ENCOUNTER — Ambulatory Visit
Admission: RE | Admit: 2017-05-02 | Discharge: 2017-05-02 | Disposition: A | Discharge status: Home / Self Care | Payer: Federal, State, Local not specified - PPO | Source: Ambulatory Visit | Attending: Family Medicine | Admitting: Family Medicine

## 2017-05-02 DIAGNOSIS — Z1231 Encounter for screening mammogram for malignant neoplasm of breast: Secondary | ICD-10-CM

## 2017-05-04 ENCOUNTER — Telehealth (INDEPENDENT_AMBULATORY_CARE_PROVIDER_SITE_OTHER): Payer: Self-pay | Admitting: Orthopaedic Surgery

## 2017-05-04 NOTE — Telephone Encounter (Signed)
Could come in for an injection

## 2017-05-04 NOTE — Telephone Encounter (Signed)
See message below °

## 2017-05-04 NOTE — Telephone Encounter (Signed)
Patient states she having swelling in her feet & ankles. Pt thinks its coming from the medication Diclofenac and she spoke with the pharmacy on 05/03/17 & was advised to stop taking until she speak with Dr.Xu

## 2017-05-04 NOTE — Telephone Encounter (Signed)
What would you like for pt to do ?

## 2017-05-04 NOTE — Telephone Encounter (Signed)
ok 

## 2017-05-05 NOTE — Telephone Encounter (Signed)
Called pt to advise no answer LMOM. Advised her if there was availability with another physician today if not she can wait for Dr Roda ShuttersXu on Monday

## 2017-05-08 ENCOUNTER — Telehealth (INDEPENDENT_AMBULATORY_CARE_PROVIDER_SITE_OTHER): Payer: Self-pay | Admitting: Orthopaedic Surgery

## 2017-05-08 NOTE — Telephone Encounter (Signed)
Please advise on message. 

## 2017-05-08 NOTE — Telephone Encounter (Signed)
PT REQUESTED A CALL BACK ABOUT HER KNEE. SHE WANTED TO KNOW WHAT KIND OF INJ SHE SHOULD GET THAT DR. Roda ShuttersXU RECOMMENDED AT PREVIOUS APPT.   SHE ALSO WANTED TO KNOW IF WE CAN SEND HER FOR AN MRI.  [phone]

## 2017-05-08 NOTE — Telephone Encounter (Signed)
Coritsone injection.  That's fine.  MRI r/o structural abnormality

## 2017-05-09 ENCOUNTER — Other Ambulatory Visit (INDEPENDENT_AMBULATORY_CARE_PROVIDER_SITE_OTHER): Payer: Self-pay | Admitting: Orthopaedic Surgery

## 2017-05-09 DIAGNOSIS — G8929 Other chronic pain: Secondary | ICD-10-CM

## 2017-05-09 DIAGNOSIS — M25561 Pain in right knee: Principal | ICD-10-CM

## 2017-05-09 NOTE — Telephone Encounter (Signed)
ORDER MADE PT AWARE, SHE WILL CALL US LATER TO SEE IF SHE WOULD LIKE TO GET INJ

## 2017-05-12 ENCOUNTER — Ambulatory Visit (INDEPENDENT_AMBULATORY_CARE_PROVIDER_SITE_OTHER): Payer: Federal, State, Local not specified - PPO | Admitting: Orthopaedic Surgery

## 2017-05-12 DIAGNOSIS — M25561 Pain in right knee: Secondary | ICD-10-CM

## 2017-05-12 NOTE — Progress Notes (Signed)
Office Visit Note   Patient: Christine Scott           Date of Birth: 10/14/1941           MRN: [id number] Visit Date: 05/12/2017              Requested by: Tally JoeSwayne, David, MD [phone] [address] [place], [place] [address] PCP: Tally JoeSwayne, David, MD   Assessment & Plan: Visit Diagnoses:  1. Acute pain of right knee     Plan: Right knee injection was performed today. Patient tolerates well. Follow up with me as needed.  Follow-Up Instructions: Return if symptoms worsen or fail to improve.   Orders:  No orders of the defined types were placed in this encounter.  No orders of the defined types were placed in this encounter.     Procedures: Large Joint Inj Date/Time: 05/12/2017 11:39 PM Performed by: Tarry KosXU, NAIPING M Authorized by: Tarry KosXU, NAIPING M   Consent Given by:  Patient Timeout: prior to procedure the correct patient, procedure, and site was verified   Indications:  Pain Location:  Knee Site:  R knee Prep: patient was prepped and draped in usual sterile fashion   Needle Size:  22 G Ultrasound Guidance: No   Fluoroscopic Guidance: No   Arthrogram: No   Patient tolerance:  Patient tolerated the procedure well with no immediate complications     Clinical Data: No additional findings.   Subjective: No chief complaint on file.   Patient follows up today for right knee pain requesting a knee injection. Her pain is worse with weightbearing.    Review of Systems   Objective: Vital Signs: There were no vitals taken for this visit.  Physical Exam  Ortho Exam Right knee exam shows no effusion. Specialty Comments:  No specialty comments available.  Imaging: No results found.   PMFS History: Patient Active Problem List   Diagnosis Date Noted  . Acute pain of right knee 05/01/2017  . Sjogren's syndrome 12/25/2016  . 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 Primary osteoarthritis of both feet  12/25/2016  . Osteoporosis 12/25/2016   Past Medical History:  Diagnosis Date  . Abnormal Pap smear of cervix    --hx abnormal paps in her 30s and per patient this was reason for her Hysterectomy  . Depression   . Dry eye   . Hypertension   . Osteoarthritis    hands  . Osteoporosis   . Polymyalgia rheumatica (HCC)     Family History  Problem Relation Age of Onset  . Congestive Heart Failure Mother   . Stroke Mother   . Diabetes Father   . Colon cancer Brother   . Diabetes Brother   . Diabetes Brother   . Stroke Brother   . Colon cancer Daughter     Past Surgical History:  Procedure Laterality Date  . BREAST SURGERY  2016   [place]  Mar[place] Kitchen. FACIAL COSMETIC SURGERY    . TOTAL VAGINAL HYSTERECTOMY  age 76   --due to abnormal pap smears per patient   Social History   Occupational History  . Not on file.   Social History Main Topics  . Smoking status: Never Smoker  . Smokeless tobacco: Never Used  . Alcohol use 0.6 oz/week    1 Standard drinks or equivalent per week     Comment: occasional--2 glasses of wine/month  . Drug use: No  .  Sexual activity: No     Comment: TVH

## 2017-05-24 ENCOUNTER — Telehealth (INDEPENDENT_AMBULATORY_CARE_PROVIDER_SITE_OTHER): Payer: Self-pay | Admitting: *Deleted

## 2017-05-24 NOTE — Telephone Encounter (Signed)
yes

## 2017-05-24 NOTE — Telephone Encounter (Signed)
Pt is having MRI of R knee done tomorrow 05/25/17 and wants to know if she can also get MRI of R leg, says she mentioned it hurting her when in office last.   Ok to order MRI of R leg?  Please advise.   C/B [phone]

## 2017-05-24 NOTE — Telephone Encounter (Signed)
Please advise 

## 2017-05-25 ENCOUNTER — Other Ambulatory Visit (INDEPENDENT_AMBULATORY_CARE_PROVIDER_SITE_OTHER): Payer: Self-pay | Admitting: Orthopaedic Surgery

## 2017-05-25 ENCOUNTER — Ambulatory Visit
Admission: RE | Admit: 2017-05-25 | Discharge: 2017-05-25 | Disposition: A | Discharge status: Home / Self Care | Payer: Federal, State, Local not specified - PPO | Source: Ambulatory Visit | Attending: Orthopaedic Surgery | Admitting: Orthopaedic Surgery

## 2017-05-25 DIAGNOSIS — M79604 Pain in right leg: Secondary | ICD-10-CM

## 2017-05-25 DIAGNOSIS — M25561 Pain in right knee: Principal | ICD-10-CM

## 2017-05-25 DIAGNOSIS — G8929 Other chronic pain: Secondary | ICD-10-CM

## 2017-05-25 NOTE — Telephone Encounter (Signed)
Order placed for MRI Right leg.

## 2017-06-03 ENCOUNTER — Inpatient Hospital Stay: Admission: RE | Admit: 2017-06-03 | Payer: Federal, State, Local not specified - PPO | Source: Ambulatory Visit

## 2017-06-15 ENCOUNTER — Ambulatory Visit (INDEPENDENT_AMBULATORY_CARE_PROVIDER_SITE_OTHER): Payer: Federal, State, Local not specified - PPO | Admitting: Orthopaedic Surgery

## 2017-06-15 ENCOUNTER — Other Ambulatory Visit (INDEPENDENT_AMBULATORY_CARE_PROVIDER_SITE_OTHER): Payer: Self-pay

## 2017-06-15 DIAGNOSIS — M5416 Radiculopathy, lumbar region: Secondary | ICD-10-CM

## 2017-06-15 DIAGNOSIS — M5441 Lumbago with sciatica, right side: Secondary | ICD-10-CM

## 2017-06-15 NOTE — Progress Notes (Signed)
Office Visit Note   Patient: Christine Scott           Date of Birth: 03/14/1941           MRN: [id number] Visit Date: 06/15/2017              Requested by: Tally JoeSwayne, David, MD [phone] [address] [place] , [place] [address] PCP: Tally JoeSwayne, David, MD   Assessment & Plan: Visit Diagnoses:  1. Lumbar radiculopathy     Plan: I reviewed her MRI which is essentially shows mild degenerative changes and a lateral meniscus tear that's not really symptomatic. I think she is dealing with lumbar radiculopathy. 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 I recommend MRI to evaluate for spinal stenosis.  Follow-Up Instructions: Return in about 2 weeks (around 06/29/2017).   Orders:  No orders of the defined types were placed in this encounter.  No orders of the defined types were placed in this encounter.     Procedures: No procedures performed   Clinical Data: No additional findings.   Subjective: No chief complaint on file.   Ms. Christine Scott follows up today for her knee MRI. She states that she is now having more pain that radiates up and down her leg and passed her knee.    Review of Systems  Constitutional: Negative.   HENT: Negative.   Eyes: Negative.   Respiratory: Negative.   Cardiovascular: Negative.   Endocrine: Negative.   Musculoskeletal: Negative.   Neurological: Negative.   Hematological: Negative.   Psychiatric/Behavioral: Negative.   All other systems reviewed and are negative.    Objective: Vital Signs: There were no vitals taken for this visit.  Physical Exam  Constitutional: She is oriented to person, place, and time. She appears well-developed and well-nourished.  Pulmonary/Chest: Effort normal.  Neurological: She is alert and oriented to person, place, and time.  Skin: Skin is warm. Capillary refill takes less than 2 seconds.  Psychiatric: She has a normal mood and affect. Her behavior is normal. Judgment and thought  content normal.  Nursing note and vitals reviewed.   Ortho Exam Right knee exam is stable. She does not have any lateral joint line tenderness. Specialty Comments:  No specialty comments available.  Imaging: No results found.   PMFS History: Patient Active Problem List   Diagnosis Date Noted  . Lumbar radiculopathy 06/15/2017  . Acute pain of right knee 05/01/2017  . Sjogren's syndrome 12/25/2016  . 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 Primary osteoarthritis of both feet 12/25/2016  . Osteoporosis 12/25/2016   Past Medical History:  Diagnosis Date  . Abnormal Pap smear of cervix    --hx abnormal paps in her 30s and per patient this was reason for her Hysterectomy  . Depression   . Dry eye   . Hypertension   . Osteoarthritis    hands  . Osteoporosis   . Polymyalgia rheumatica (HCC)     Family History  Problem Relation Age of Onset  . Congestive Heart Failure Mother   . Stroke Mother   . Diabetes Father   . Colon cancer Brother   . Diabetes Brother   . Diabetes Brother   . Stroke Brother   . Colon cancer Daughter     Past Surgical History:  Procedure Laterality Date  . BREAST SURGERY  2016   [place]  Mar[place] Kitchen. FACIAL COSMETIC SURGERY    .  TOTAL VAGINAL HYSTERECTOMY  age 64   --due to abnormal pap smears per patient   Social History   Occupational History  . Not on file.   Social History Main Topics  . Smoking status: Never Smoker  . Smokeless tobacco: Never Used  . Alcohol use 0.6 oz/week    1 Standard drinks or equivalent per week     Comment: occasional--2 glasses of wine/month  . Drug use: No  . Sexual activity: No     Comment: TVH

## 2017-06-26 ENCOUNTER — Ambulatory Visit: Payer: Federal, State, Local not specified - PPO | Admitting: Rheumatology

## 2017-07-04 ENCOUNTER — Ambulatory Visit
Admission: RE | Admit: 2017-07-04 | Discharge: 2017-07-04 | Disposition: A | Discharge status: Home / Self Care | Payer: Federal, State, Local not specified - PPO | Source: Ambulatory Visit | Attending: Orthopaedic Surgery | Admitting: Orthopaedic Surgery

## 2017-07-04 DIAGNOSIS — M5441 Lumbago with sciatica, right side: Secondary | ICD-10-CM

## 2017-07-10 ENCOUNTER — Encounter (INDEPENDENT_AMBULATORY_CARE_PROVIDER_SITE_OTHER): Payer: Self-pay | Admitting: Orthopaedic Surgery

## 2017-07-10 ENCOUNTER — Ambulatory Visit (INDEPENDENT_AMBULATORY_CARE_PROVIDER_SITE_OTHER): Payer: Federal, State, Local not specified - PPO | Admitting: Orthopaedic Surgery

## 2017-07-10 DIAGNOSIS — M5416 Radiculopathy, lumbar region: Secondary | ICD-10-CM

## 2017-07-10 NOTE — Progress Notes (Signed)
Office Visit Note   Patient: Christine Scott           Date of Birth: 09/10/1941           MRN: [id number] Visit Date: 07/10/2017              Requested by: Tally JoeSwayne, David, MD [phone] [address] [place] [place], [place] [address] PCP: Tally JoeSwayne, David, MD   Assessment & Plan: Visit Diagnoses:  1. Lumbar radiculopathy     Plan: Patient symptoms are waxing and waning and worse at night. At this point she does not feel like she denies any intervention. 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 She will call when she wants to do an epidural steroid injection. Total face to face encounter time was greater than 25 minutes and over half of this time was spent in counseling and/or coordination of care.  Follow-Up Instructions: Return if symptoms worsen or fail to improve.   Orders:  No orders of the defined types were placed in this encounter.  No orders of the defined types were placed in this encounter.     Procedures: No procedures performed   Clinical Data: No additional findings.   Subjective: Chief Complaint  Patient presents with  . Lower Back - Pain, Follow-up    Patient follows up today to review her MRI. Denies any numbness or tingling.    Review of Systems  Constitutional: Negative.   HENT: Negative.   Eyes: Negative.   Respiratory: Negative.   Cardiovascular: Negative.   Endocrine: Negative.   Musculoskeletal: Negative.   Neurological: Negative.   Hematological: Negative.   Psychiatric/Behavioral: Negative.   All other systems reviewed and are negative.    Objective: Vital Signs: There were no vitals taken for this visit.  Physical Exam  Constitutional: She is oriented to person, place, and time. She appears well-developed and well-nourished.  Pulmonary/Chest: Effort normal.  Neurological: She is alert and oriented to person, place, and time.  Skin: Skin is  warm. Capillary refill takes less than 2 seconds.  Psychiatric: She has a normal mood and affect. Her behavior is normal. Judgment and thought content normal.  Nursing note and vitals reviewed.   Ortho Exam Exam is stable. Specialty Comments:  No specialty comments available.  Imaging: No results found.   PMFS History: Patient Active Problem List   Diagnosis Date Noted  . Lumbar radiculopathy 06/15/2017  . Acute pain of right knee 05/01/2017  . Sjogren's syndrome 12/25/2016  . 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 Primary osteoarthritis of both feet 12/25/2016  . Osteoporosis 12/25/2016   Past Medical History:  Diagnosis Date  . Abnormal Pap smear of cervix    --hx abnormal paps in her 30s and per patient this was reason for her Hysterectomy  . Depression   . Dry eye   . Hypertension   . Osteoarthritis    hands  . Osteoporosis   . Polymyalgia rheumatica (HCC)     Family History  Problem Relation Age of Onset  . Congestive Heart Failure Mother   . Stroke Mother   . Diabetes Father   . Colon cancer Brother   . Diabetes Brother   . Diabetes Brother   . Stroke Brother   . Colon cancer Daughter     Past Surgical History:  Procedure Laterality Date  . BREAST SURGERY  2016   benign mass removed in [place], [place].  Marland Kitchen FACIAL COSMETIC SURGERY    . TOTAL VAGINAL HYSTERECTOMY  age 66   --due to abnormal pap smears per patient   Social History   Occupational History  . Not on file.   Social History Main Topics  . Smoking status: Never Smoker  . Smokeless tobacco: Never Used  . Alcohol use 0.6 oz/week    1 Standard drinks or equivalent per week     Comment: occasional--2 glasses of wine/month  . Drug use: No  . Sexual activity: No     Comment: TVH

## 2017-08-04 ENCOUNTER — Encounter: Payer: Self-pay | Admitting: Rheumatology

## 2017-08-04 ENCOUNTER — Ambulatory Visit (INDEPENDENT_AMBULATORY_CARE_PROVIDER_SITE_OTHER): Payer: Federal, State, Local not specified - PPO | Admitting: Rheumatology

## 2017-08-04 NOTE — Progress Notes (Deleted)
Office Visit Note  Patient: Christine Scott             Date of Birth: 01/16/1941           MRN: [id number]             PCP: Tally JoeSwayne, David, MD Referring: Wilburn MylarKelly, Samuel S, MD Visit Date: 08/04/2017 Occupation: @GUAROCC @    Subjective:  No chief complaint on file.   History of Present Illness: Christine DyFrances G Resnik is a 76 y.o. female  Last seen in our office on 12/27/2016 for sicca symptoms and polymyalgia rheumatica. At that visit, her sicca symptoms were doing well. Her polymyalgia rheumatica was also doing well. Unfortunately, patient tried to sit on a chair, missed the chair, landed on the floor and ended up having lower back pain after that. On that visit, she had pain to the right SI joint which did not radiate anywhere. She also had right trochanteric bursa pain. She also struggles with hypertension and is on amlodipine 2.5 mg daily. Despite taking those medications she was struggling with elevated blood pressure.  Today,***   Activities of Daily Living:  Patient reports morning stiffness for 15 minutes.   Patient Reports nocturnal pain.  Difficulty dressing/grooming: Denies Difficulty climbing stairs: Reports Difficulty getting out of chair: Reports Difficulty using hands for taps, buttons, cutlery, and/or writing: Denies   Review of Systems  Constitutional: Negative for fatigue.  HENT: Positive for mouth dryness. Negative for mouth sores.   Eyes: Positive for dryness.  Respiratory: Negative for shortness of breath.   Cardiovascular: Hypertension: hx of htn (on bp meds)  Gastrointestinal: Negative for constipation and diarrhea.  Musculoskeletal: Negative for myalgias and myalgias.  Skin: Negative for sensitivity to sunlight.  Psychiatric/Behavioral: Negative for decreased concentration and sleep disturbance.    PMFS History:  Patient Active Problem List   Diagnosis Date Noted  . Lumbar radiculopathy 06/15/2017  . Acute pain of right knee 05/01/2017  . Sjogren's syndrome  12/25/2016  . 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 Primary osteoarthritis of both feet 12/25/2016  . Osteoporosis 12/25/2016    Past Medical History:  Diagnosis Date  . Abnormal Pap smear of cervix    --hx abnormal paps in her 30s and per patient this was reason for her Hysterectomy  . Depression   . Dry eye   . Hypertension   . Osteoarthritis    hands  . Osteoporosis   . Polymyalgia rheumatica (HCC)     Family History  Problem Relation Age of Onset  . Congestive Heart Failure Mother   . Stroke Mother   . Diabetes Father   . Colon cancer Brother   . Diabetes Brother   . Diabetes Brother   . Stroke Brother   . Colon cancer Daughter    Past Surgical History:  Procedure Laterality Date  . BREAST SURGERY  2016   [place]  Marland Kitchen. FACIAL COSMETIC SURGERY    . TOTAL VAGINAL HYSTERECTOMY  age 76   --due to abnormal pap smears per patient   Social History   Social History Narrative  . No narrative on file     Objective: Vital Signs: BP (!) 164/58 (BP Location: Right Arm, Patient Position: Sitting, Cuff Size: Normal)   Pulse 62   Ht 5\' 1"  (1.549 m)   Wt 123 lb (55.8 kg)   BMI 23.24 kg/m    Physical Exam  Constitutional: She is oriented  to person, place, and time. She appears well-developed and well-nourished.  HENT:  Head: Normocephalic and atraumatic.  Eyes: Pupils are equal, round, and reactive to light. EOM are normal.  Cardiovascular: Normal rate, regular rhythm and normal heart sounds.  Exam reveals no gallop and no friction rub.   No murmur heard. Pulmonary/Chest: Effort normal and breath sounds normal. She has no wheezes. She has no rales.  Abdominal: Soft. Bowel sounds are normal. She exhibits no distension. There is no tenderness. There is no guarding. No hernia.  Musculoskeletal: Normal range of motion. She exhibits no edema, tenderness or deformity.  Lymphadenopathy:     She has no cervical adenopathy.  Neurological: She is alert and oriented to person, place, and time. Coordination normal.  Skin: Skin is warm and dry. Capillary refill takes less than 2 seconds. No rash noted.  Psychiatric: She has a normal mood and affect. Her behavior is normal.  Nursing note and vitals reviewed.    Musculoskeletal Exam: ***  CDAI Exam: No CDAI exam completed.    Investigation: No additional findings. No visits with results within 6 Month(s) from this visit.  Latest known visit with results is:  Office Visit on 04/13/2016  Component Date Value Ref Range Status  . Color, UA 04/13/2016 yellow   Final  . Clarity, UA 04/13/2016 clear   Final  . Glucose, UA 04/13/2016 n   Final  . Bilirubin, UA 04/13/2016 n   Final  . Ketones, UA 04/13/2016 n   Final  . Blood, UA 04/13/2016 n   Final  . pH, UA 04/13/2016 5.0   Final  . Protein, UA 04/13/2016 n   Final  . Urobilinogen, UA 04/13/2016 negative   Final  . Nitrite, UA 04/13/2016 n   Final  . Leukocytes, UA 04/13/2016 small (1+)* Negative Final  . COMMENTS: 04/13/2016 [facility]   Final-Edited   Comment: [address], [place], [place] [address] [address] [place], [place] [address] GYN CYTOLOGY REPORT  PATIENT NAME:Cuadros, Jazzlene G PATHOLOGY#:[id number]: F DOB: Jul 01, 1941 (Age: 1) MEDICAL RECORD NUMBER:[id number] DOCTOR:Brook Edward Jolly, MD DATE OBTAINED:4/19/2017CLIENT:[facility] DATE RECEIVED:4/20/2017OTHER PHYS: DATE SIGNED:04/14/2016 PAP- ThinLayer Final Cytologic Interpretation:       Vaginal Cuff, ThinLayer with Automated Imaging and Dual Review, CPT 88175      Negative for Intraepithelial Lesions or Malignancy.       ADEQUACY OF SPECIMEN:           Satisfactory for evaluation.              OTHER CYTOLOGIC FINDINGS:            Marked/Severe Inflammation.      Mixed Atrophic cell pattern present.       NOTE: This Pap test has been evaluated with computer assisted  technology.       Electronically signed by: PAL, CT(ASCP), [address], [place], [place], (Med. Dir.: Eulogio Ditch, MD) pal/4/20/2017The Pap test is a screening mechanism with excel                          lent but not perfect ability to prevent cervical carcinoma.  It has a low, but  significant, diagnostic error rate. The pap test is suboptimal  for detection of glandular lesions.  It should be noted that a negative result does not definitively rule out the presence of disease.Ref: DeMay, RM, The Art and Science of Cytopathology,  [employer], [phone]. Last Menstrual  Period: 12/27/1995 Menstrual/Pregnancy History: Hysterectomy: DUE TO ABNORMAL PAPS   Technical processing performed at [facility], [address], [place], [place] [address], CLIA# [id number].   . Colony Count 04/13/2016 50,000 COLONIES/ML   Final  . Organism ID, Bacteria 04/13/2016 Multiple bacterial morphotypes present, none   Final  . Organism ID, Bacteria 04/13/2016 predominant. Suggest appropriate recollection if    Final  . Organism ID, Bacteria 04/13/2016 clinically indicated.   Final  . WBC, UA 04/13/2016 10-20* <=5 WBC/HPF Final  . RBC / HPF 04/13/2016 NONE SEEN  <=2 RBC/HPF Final  . Squamous Epithelial / LPF 04/13/2016 0-5  <=5 HPF Final  . Bacteria, UA 04/13/2016 NONE SEEN  NONE SEEN HPF Final  . Crystals 04/13/2016 NONE SEEN  NONE SEEN HPF Final  . Casts 04/13/2016 NONE SEEN  NONE SEEN LPF Final  . Yeast 04/13/2016 NONE SEEN  NONE SEEN HPF Final     Imaging: No results found.  Speciality Comments: No specialty comments available.    Procedures:  No procedures performed Allergies: Patient has no known allergies.   Assessment / Plan:     Visit Diagnoses: No diagnosis found.    Orders: No orders of the defined types were placed in this encounter.  No orders of the defined types were placed in this encounter.   Face-to-face time spent with patient was 30 minutes. 50% of time was  spent in counseling and coordination of care.  Follow-Up Instructions: No Follow-up on file.   Tawni Pummel, PA-C  Note - This record has been created using [employer].  Chart creation errors have been sought, but may not always  have been located. Such creation errors do not reflect on  the standard of medical care.

## 2017-08-12 DIAGNOSIS — Z8659 Personal history of other mental and behavioral disorders: Secondary | ICD-10-CM

## 2017-08-12 DIAGNOSIS — I10 Essential (primary) hypertension: Secondary | ICD-10-CM | POA: Insufficient documentation

## 2017-08-12 HISTORY — DX: Personal history of other mental and behavioral disorders: Z86.59

## 2017-08-12 NOTE — Progress Notes (Signed)
Office Visit Note  Patient: Christine Scott             Date of Birth: Apr 07, 1941           MRN: [id number]             PCP: Tally Joe, MD Referring: Wilburn Mylar, MD Visit Date: 08/15/2017 Occupation: @GUAROCC @    Subjective:  Lower back pain and knee pain.   History of Present Illness: Christine Scott is a 76 y.o. female with history of Sjogren's osteoarthritis and polymyalgia rheumatica. She states she was seen recently by Dr. Roda Shutters due to right knee joint pain. He did x-ray of the right knee joint and then MRI which showed a small meniscal tear. Her symptoms got better and did not need any further intervention. She's been also having lower back pain and he did MRI of her lower back which showed some foraminal narrowing on the right side she states she's been having pain on the left side of her lower back. She denies any increased muscle weakness or tenderness. She has some stiffness in her hand and some pain in her feet at nighttime. She was given a short course of prednisone by Dr. Deno Etienne which she finished. She did not like the side effects of prednisone. She continues to have some dry mouth and dry eyes symptoms.  Activities of Daily Living:  Patient reports morning stiffness for 15 minutes.   Patient Denies nocturnal pain.  Difficulty dressing/grooming: Denies Difficulty climbing stairs: Denies Difficulty getting out of chair: Denies Difficulty using hands for taps, buttons, cutlery, and/or writing: Denies   Review of Systems  Constitutional: Positive for fatigue. Negative for night sweats, weight gain, weight loss and weakness.  HENT: Positive for mouth dryness. Negative for mouth sores, trouble swallowing, trouble swallowing and nose dryness.   Eyes: Positive for dryness. Negative for pain, redness and visual disturbance.  Respiratory: Negative for cough, shortness of breath and difficulty breathing.   Cardiovascular: Negative for chest pain, palpitations, hypertension,  irregular heartbeat and swelling in legs/feet.  Gastrointestinal: Negative for blood in stool, constipation and diarrhea.  Endocrine: Negative for increased urination.  Genitourinary: Negative for vaginal dryness.  Musculoskeletal: Positive for arthralgias, joint pain and muscle tenderness. Negative for joint swelling, myalgias, muscle weakness, morning stiffness and myalgias.  Skin: Negative for color change, rash, hair loss, skin tightness, ulcers and sensitivity to sunlight.  Allergic/Immunologic: Negative for susceptible to infections.  Neurological: Negative for dizziness, memory loss and night sweats.  Hematological: Negative for swollen glands.  Psychiatric/Behavioral: Negative for depressed mood and sleep disturbance. The patient is not nervous/anxious.     PMFS History:  Patient Active Problem List   Diagnosis Date Noted  . History of depression 08/12/2017  . Essential hypertension 08/12/2017  . Lumbar radiculopathy 06/15/2017  . Acute pain of right knee 05/01/2017  . Sjogren's syndrome 12/25/2016  . 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 Primary osteoarthritis of both feet 12/25/2016  . Osteoporosis 12/25/2016    Past Medical History:  Diagnosis Date  . Abnormal Pap smear of cervix    --hx abnormal paps in her 30s and per patient this was reason for her Hysterectomy  . Depression   . Dry eye   . Hypertension   . Osteoarthritis    hands  . Osteoporosis   . Polymyalgia rheumatica (HCC)     Family History  Problem Relation Age of Onset  . Congestive Heart  Failure Mother   . Stroke Mother   . Diabetes Father   . Colon cancer Brother   . Diabetes Brother   . Diabetes Brother   . Stroke Brother   . Colon cancer Daughter    Past Surgical History:  Procedure Laterality Date  . BREAST SURGERY  2016   benign mass removed in [place], [place].  Marland Kitchen FACIAL COSMETIC SURGERY    . TOTAL VAGINAL HYSTERECTOMY  age 34    --due to abnormal pap smears per patient   Social History   Social History Narrative  . No narrative on file     Objective: Vital Signs: BP 118/62   Pulse 74   Resp 14   Ht 5\' 3"  (1.6 m)   Wt 123 lb (55.8 kg)   BMI 21.79 kg/m    Physical Exam  Constitutional: She is oriented to person, place, and time. She appears well-developed and well-nourished.  HENT:  Head: Normocephalic and atraumatic.  Eyes: Conjunctivae and EOM are normal.  Neck: Normal range of motion.  Cardiovascular: Normal rate, regular rhythm, normal heart sounds and intact distal pulses.   Pulmonary/Chest: Effort normal and breath sounds normal.  Abdominal: Soft. Bowel sounds are normal.  Lymphadenopathy:    She has no cervical adenopathy.  Neurological: She is alert and oriented to person, place, and time.  Skin: Skin is warm and dry. Capillary refill takes less than 2 seconds.  Psychiatric: She has a normal mood and affect. Her behavior is normal.  Nursing note and vitals reviewed.    Musculoskeletal Exam: C-spine and thoracic lumbar spine limited range of motion. She has thoracic scoliosis. Shoulder joints although joints wrist joints are good range of motion. She has DIPs appendectomy thickening and subluxation of several of her DIP joints. No synovitis was noted. Hip joints knee joints ankles MTPs PIPs with good range of motion. She is some warmth in her right knee joint without any discomfort.  CDAI Exam: No CDAI exam completed.    Investigation: No additional findings.   Imaging: No results found.  Speciality Comments: No specialty comments available.    Procedures:  No procedures performed Allergies: Patient has no known allergies.   Assessment / Plan:     Visit Diagnoses: Sjogren's syndrome - On Restasis - she continues to have some sicca symptoms and using over-the-counter products. Which were again reviews. Plan: CBC with Differential/Platelet, COMPLETE METABOLIC PANEL WITH GFR,  Urinalysis, Routine w reflex microscopic  Primary osteoarthritis of both hands: She has subluxation of several of her DIP joints. I offered wearing brace but she would like to wait at this point.  Primary osteoarthritis of both feet: Proper fitting shoes were discussed.  DDD lumbar spine: She's been having increased lower back pain I discussed the option of physical therapy. She would like to wait at this time. Have given her handout on back exercises.  Polymyalgia rheumatica (HCC) - In remission. She has no proximal muscle weakness or tenderness.  Osteoporosis - On Prolia - Plan: VITAMIN D 25 Hydroxy (Vit-D Deficiency, Fractures) patient gets regular DEXA scan.  History of depression  Essential hypertension: Blood pressure is well controlled.    Orders: Orders Placed This Encounter  Procedures  . CBC with Differential/Platelet  . COMPLETE METABOLIC PANEL WITH GFR  . Urinalysis, Routine w reflex microscopic  . VITAMIN D 25 Hydroxy (Vit-D Deficiency, Fractures)   No orders of the defined types were placed in this encounter.   Face-to-face time spent with patient was 30 minutes.Greater  than 50% of time was spent in counseling and coordination of care.  Follow-Up Instructions: Return in about 6 months (around 02/15/2018) for Sjogren's, Osteoarthritis.   Pollyann Savoy, MD  Note - This record has been created using [employer].  Chart creation errors have been sought, but may not always  have been located. Such creation errors do not reflect on  the standard of medical care.

## 2017-08-15 ENCOUNTER — Encounter: Payer: Self-pay | Admitting: Rheumatology

## 2017-08-15 ENCOUNTER — Ambulatory Visit (INDEPENDENT_AMBULATORY_CARE_PROVIDER_SITE_OTHER): Payer: Federal, State, Local not specified - PPO | Admitting: Rheumatology

## 2017-08-15 VITALS — BP 118/62 | HR 74 | Resp 14 | Ht 63.0 in | Wt 123.0 lb

## 2017-08-15 DIAGNOSIS — I10 Essential (primary) hypertension: Secondary | ICD-10-CM

## 2017-08-15 DIAGNOSIS — M3501 Sicca syndrome with keratoconjunctivitis: Secondary | ICD-10-CM

## 2017-08-15 DIAGNOSIS — M19071 Primary osteoarthritis, right ankle and foot: Secondary | ICD-10-CM

## 2017-08-15 DIAGNOSIS — M353 Polymyalgia rheumatica: Secondary | ICD-10-CM | POA: Diagnosis not present

## 2017-08-15 DIAGNOSIS — M19042 Primary osteoarthritis, left hand: Secondary | ICD-10-CM | POA: Diagnosis not present

## 2017-08-15 DIAGNOSIS — M19041 Primary osteoarthritis, right hand: Secondary | ICD-10-CM | POA: Diagnosis not present

## 2017-08-15 DIAGNOSIS — M810 Age-related osteoporosis without current pathological fracture: Secondary | ICD-10-CM

## 2017-08-15 DIAGNOSIS — Z8659 Personal history of other mental and behavioral disorders: Secondary | ICD-10-CM | POA: Diagnosis not present

## 2017-08-15 DIAGNOSIS — M19072 Primary osteoarthritis, left ankle and foot: Secondary | ICD-10-CM

## 2017-08-15 DIAGNOSIS — M5136 Other intervertebral disc degeneration, lumbar region: Secondary | ICD-10-CM

## 2017-08-15 LAB — CBC WITH DIFFERENTIAL/PLATELET
BASOS PCT: 0 %
Basophils Absolute: 0 cells/uL (ref 0–200)
EOS PCT: 2 %
Eosinophils Absolute: 116 cells/uL (ref 15–500)
HCT: 35.5 % (ref 35.0–45.0)
HEMOGLOBIN: 11.4 g/dL — AB (ref 11.7–15.5)
Lymphocytes Relative: 20 %
Lymphs Abs: 1160 cells/uL (ref 850–3900)
MCH: 29.9 pg (ref 27.0–33.0)
MCHC: 32.1 g/dL (ref 32.0–36.0)
MCV: 93.2 fL (ref 80.0–100.0)
MPV: 10.2 fL (ref 7.5–12.5)
Monocytes Absolute: 638 cells/uL (ref 200–950)
Monocytes Relative: 11 %
NEUTROS ABS: 3886 {cells}/uL (ref 1500–7800)
Neutrophils Relative %: 67 %
PLATELETS: 190 10*3/uL (ref 140–400)
RBC: 3.81 MIL/uL (ref 3.80–5.10)
RDW: 14.6 % (ref 11.0–15.0)
WBC: 5.8 10*3/uL (ref 3.8–10.8)

## 2017-08-15 NOTE — Patient Instructions (Signed)

## 2017-08-16 ENCOUNTER — Telehealth: Payer: Self-pay | Admitting: *Deleted

## 2017-08-16 LAB — URINALYSIS, ROUTINE W REFLEX MICROSCOPIC
BILIRUBIN URINE: NEGATIVE
GLUCOSE, UA: NEGATIVE
HGB URINE DIPSTICK: NEGATIVE
Ketones, ur: NEGATIVE
Leukocytes, UA: NEGATIVE
Nitrite: NEGATIVE
PH: 7 (ref 5.0–8.0)
PROTEIN: NEGATIVE
Specific Gravity, Urine: 1.013 (ref 1.001–1.035)

## 2017-08-16 LAB — COMPLETE METABOLIC PANEL WITH GFR
ALBUMIN: 3.8 g/dL (ref 3.6–5.1)
ALK PHOS: 43 U/L (ref 33–130)
ALT: 14 U/L (ref 6–29)
AST: 20 U/L (ref 10–35)
BILIRUBIN TOTAL: 0.5 mg/dL (ref 0.2–1.2)
BUN: 13 mg/dL (ref 7–25)
CO2: 25 mmol/L (ref 20–32)
Calcium: 9 mg/dL (ref 8.6–10.4)
Chloride: 105 mmol/L (ref 98–110)
Creat: 0.77 mg/dL (ref 0.60–0.93)
GFR, Est African American: 86 mL/min (ref >=60)
GFR, Est Non African American: 75 mL/min (ref >=60)
GLUCOSE: 75 mg/dL (ref 65–99)
Potassium: 4.2 mmol/L (ref 3.5–5.3)
SODIUM: 139 mmol/L (ref 135–146)
TOTAL PROTEIN: 5.9 g/dL — AB (ref 6.1–8.1)

## 2017-08-16 LAB — VITAMIN D 25 HYDROXY (VIT D DEFICIENCY, FRACTURES): Vit D, 25-Hydroxy: 38 ng/mL (ref 30–100)

## 2017-08-16 NOTE — Progress Notes (Signed)
Labs are stable.

## 2017-08-16 NOTE — Telephone Encounter (Signed)
I do not have any information on these vegetables. In rheumatology literature there are no restrictions and intake of vegetables.

## 2017-08-16 NOTE — Telephone Encounter (Signed)
Patient states she has been doing some research and states she has been reading that with her PMR she should avoid potatoes, tomatoes, broccoli and asparagus and kale, mushrooms in her diet. Patient would like to know your opinion on this.

## 2017-08-16 NOTE — Telephone Encounter (Signed)
Patient advised there is no restrictions on vegetable intake in rheumatology literature. Patient verbalized understanding.

## 2017-08-22 NOTE — Progress Notes (Signed)
Patient missed the appointment.

## 2017-11-27 ENCOUNTER — Encounter: Payer: Self-pay | Admitting: Rheumatology

## 2017-12-01 ENCOUNTER — Telehealth: Payer: Self-pay | Admitting: *Deleted

## 2017-12-01 NOTE — Telephone Encounter (Signed)
Bone Density Scan results show Osteopenia T-Score -2.1 Improvement noted. Continue current treatment.  Attempted to contact the patient and left message for patient to call the office.

## 2017-12-07 NOTE — Telephone Encounter (Signed)
Patient advised of results and recommendations. Patient verbalized understanding.  

## 2017-12-20 ENCOUNTER — Telehealth: Payer: Self-pay | Admitting: Rheumatology

## 2017-12-20 NOTE — Telephone Encounter (Signed)
Darl PikesSusan from [facility] called for Dr. Sigmund HazelLisa Miller who would like to have a copy of patient's bone density scan and the notes about it.  Dr. Hyacinth MeekerMiller wants to know if the patient should continue on Prolia. CB# [phone] and fax # [phone]  Thank you

## 2017-12-21 NOTE — Telephone Encounter (Signed)
Darl PikesSusan from [facility] called again to check the status of us sending the bone density results and notes for Dr. Hyacinth MeekerMiller to see if she should continue on Prolia.

## 2017-12-27 NOTE — Telephone Encounter (Signed)
Copy of DEXA Scan has been faxed.

## 2017-12-29 ENCOUNTER — Telehealth: Payer: Self-pay

## 2017-12-29 NOTE — Telephone Encounter (Signed)
Received a prior authorization request for Prolia from [employer]. Authorization was completed and faxed back. Will update once we receive a response.   Will send document to scan center.  Phone: [phone]  Abran DukeHopkins, Alain Deschene, CPhT 4:37 PM

## 2018-01-05 NOTE — Telephone Encounter (Signed)
Received a fax from [employer] regarding a prior authorization approval for PROLIA from 12/04/2017 to 01/03/2019.   Reference number:[id number] Phone number:[phone]  Will send document to scan center.  Is this given by pt pcp?  Christine Scott, [place]sh[place], CPhT 8:31 AM

## 2018-01-05 NOTE — Telephone Encounter (Signed)
[facility]

## 2018-02-02 NOTE — Progress Notes (Signed)
Office Visit Note  Patient: Christine Scott             Date of Birth: March 16, 1941           MRN: [id number]             PCP: Sigmund Hazel, MD Referring: Tally Joe, MD Visit Date: 02/15/2018 Occupation: @GUAROCC @    Subjective:  Hand and wrist pain   History of Present Illness: Christine Scott is a 77 y.o. female with history of Sjogren's, polymyalgia rheumatica, osteoporosis, osteoarthritis, and DDD.  Patient states that she has not had any flares of PMR.  She states that she has occasional discomfort in her bilateral shoulders.  She denies any weakness in her upper or lower extremities.  She has discomfort on the lateral aspect of her left hip.  She has no groin pain.  She states that her bilateral hands, wrists, and feet have been more painful lately.  She denies any joint swelling.  She states she has noticed decreased grip strength in her hands and issues with fine motor movements.  She states she has morning stiffness in her lower back.  She takes 2 tablets of tylenol at night for pain relief.  She also does yoga regularly, and she is wondering if it makes her wrists more painful.  She continues to have significant dry mouth and dry eyes.  She does not use the biotene products anymore.  She sees a Education officer, community regularly.  She also uses restasis, which helps with her dry eyes.     Activities of Daily Living Patient reports morning stiffness for 15 minutes.   Patient Reports nocturnal pain.  Difficulty dressing/grooming: Denies Difficulty climbing stairs: Denies Difficulty getting out of chair: ReportsDifficulty using hands for taps, buttons, cutlery, and/or writing: Reports   Review of Systems  Constitutional: Negative for fatigue and weakness.  HENT: Positive for mouth dryness.   Eyes: Positive for dryness.  Respiratory: Negative.  Negative for cough, shortness of breath and difficulty breathing.   Cardiovascular: Negative.  Negative for chest pain, palpitations, hypertension,  irregular heartbeat and swelling in legs/feet.  Gastrointestinal: Negative.  Negative for blood in stool, constipation and diarrhea.  Endocrine: Negative.   Genitourinary: Negative.  Negative for nocturia.  Musculoskeletal: Positive for arthralgias, joint pain, joint swelling, muscle weakness and morning stiffness. Negative for myalgias, muscle tenderness and myalgias.  Skin: Negative.  Negative for color change, rash, hair loss, ulcers and sensitivity to sunlight.  Neurological: Negative.  Negative for dizziness, numbness and headaches.  Hematological: Negative.  Negative for swollen glands.  Psychiatric/Behavioral: Positive for depressed mood. Negative for sleep disturbance. The patient is not nervous/anxious.     PMFS History:  Patient Active Problem List   Diagnosis Date Noted  . History of depression 08/12/2017  . Essential hypertension 08/12/2017  . Lumbar radiculopathy 06/15/2017  . Acute pain of right knee 05/01/2017  . Sjogren's syndrome 12/25/2016  . 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 Primary osteoarthritis of both feet 12/25/2016  . Osteoporosis 12/25/2016    Past Medical History:  Diagnosis Date  . Abnormal Pap smear of cervix    --hx abnormal paps in her 30s and per patient this was reason for her Hysterectomy  . Depression   . Dry eye   . Hypertension   . Osteoarthritis    hands  . Osteoporosis   . Polymyalgia rheumatica (HCC)     Family History  Problem Relation Age  of Onset  . Congestive Heart Failure Mother   . Stroke Mother   . Diabetes Father   . Colon cancer Brother   . Diabetes Brother   . Diabetes Brother   . Stroke Brother   . Colon cancer Daughter    Past Surgical History:  Procedure Laterality Date  . BREAST SURGERY  2016   [place]  [place]. FACIAL COSMETIC SURGERY    . TOTAL VAGINAL HYSTERECTOMY  age 89   --due to abnormal pap smears per patient   Social  History   Social History Narrative  . Not on file     Objective: Vital Signs: BP 131/62   Pulse 65   Resp 16   Ht 5' 2.5" (1.588 m)   Wt 129 lb (58.5 kg)   BMI 23.22 kg/m    Physical Exam  Constitutional: She is oriented to person, place, and time. She appears well-developed and well-nourished.  HENT:  Head: Normocephalic and atraumatic.  Eyes: Conjunctivae and EOM are normal.  Neck: Normal range of motion.  Cardiovascular: Normal rate, regular rhythm, normal heart sounds and intact distal pulses.  Pulmonary/Chest: Effort normal and breath sounds normal.  Abdominal: Soft. Bowel sounds are normal.  Lymphadenopathy:    She has no cervical adenopathy.  Neurological: She is alert and oriented to person, place, and time.  Skin: Skin is warm and dry. Capillary refill takes less than 2 seconds.  Psychiatric: She has a normal mood and affect. Her behavior is normal.  Nursing note and vitals reviewed.  Musculoskeletal Exam: C-spine limited ROM with lateral rotation.  Thoracic and lumbar spine good ROM.  Shoulder joints, elbow joints, wrist joints, MCPs, PIPs, and DIPs good ROM with no synovitis.  She has tenderness of bilateral wrist joints.  She has PIP and DIP synovial thickening bilaterally.  Hip joints, knee joints, ankle joints, MTPs, PIPs, and DIPs good ROM with no synovitis.  She has osteoarthritic changes in bilateral feet.  No warmth or effusion of knees.  No knee crepitus.  She has tenderness of her left trochanteric bursa.    CDAI Exam: No CDAI exam completed.    Investigation: No additional findings. CBC Latest Ref Rng & Units 08/15/2017  WBC 3.8 - 10.8 K/uL 5.8  Hemoglobin 11.7 - 15.5 g/dL 11.4(L)  Hematocrit 35.0 - 45.0 % 35.5  Platelets 140 - 400 K/uL 190   CMP Latest Ref Rng & Units 08/15/2017  Glucose 65 - 99 mg/dL 75  BUN 7 - 25 mg/dL 13  Creatinine 9.600.60 - 4.540.93 mg/dL 0.980.77  Sodium 119135 - 147146 mmol/L 139  Potassium 3.5 - 5.3 mmol/L 4.2  Chloride 98 - 110 mmol/L  105  CO2 20 - 32 mmol/L 25  Calcium 8.6 - 10.4 mg/dL 9.0  Total Protein 6.1 - 8.1 g/dL 5.9(L)  Total Bilirubin 0.2 - 1.2 mg/dL 0.5  Alkaline Phos 33 - 130 U/L 43  AST 10 - 35 U/L 20  ALT 6 - 29 U/L 14    Imaging: No results found.  Speciality Comments: No specialty comments available.    Procedures:  No procedures performed Allergies: Patient has no known allergies.   Assessment / Plan:     Visit Diagnoses: Sjogren's syndrome - She continues to have sicca symptoms.  She sees her dentist on a regular basis.  She has not been using the biotene products at this time.  She was given a prescription for pilocarpine 5 mg TID.  Indications, contraindications, and side  effects were discussed.  UA, RF, and SPEP were obtained today. - Plan: Urinalysis, Routine w reflex microscopic, Rheumatoid factor, Serum protein electrophoresis with reflex, CBC with Differential/Platelet, COMPLETE METABOLIC PANEL WITH GFR  Polymyalgia rheumatica (HCC) - She has good strength and ROM on exam.  She experiences occasional shoulder pain.  A sed rate was ordered today.  The disease process of PMR was discussed with the patient today.  No temporal tenderness on exam. Plan: Sedimentation rate  Primary osteoarthritis of both hands: She has PIP and DIP synovial thickening bilaterally in her hands.  Joint protection and muscle strengthening were discussed.  She was given a handout of hand exercises that she can perform at home.   Primary osteoarthritis of both feet: She has osteoarthritic changes in her feet.  We discussed proper fitting shoes.    Osteopenia -She has a history of osteoporosis that has improved on Prolia.  She has been on prolia for 3-4 years.  She would like to continue on prolia for 2 more years until her next DEXA.  She will require a repeat DEXA in 2 years, which she reports her new PCP will order.  Discussed switching to a bisphonate if her BMD continues to improve.     DDD (degenerative disc  disease), lumbar: Chronic discomfort.  She experiences stiffness in her lower back in the morning.  She has no midline spinal tenderness.    Bilateral wrist pain: She has no synovitis on exam.  She has good ROM bilaterally. She has been having increased bilateral wrist pain.  She attends yoga on a regular basis.  Discussed avoiding overuse activities and avoiding exercises that cause discomfort in her wrists.  She can ice her wrists after yoga.  She was given a handout of hand exercises that she can perform at home.    Trochanteric bursitis, left hip: She has tenderness of her left trochanteric bursa on exam.  She lays on this side at night.  Discussed exercises she can perform at home.  She was given a handout of exercises she can perform.  We discussed that if she continues to experience pain and point tenderness, a cortisone injection is a treatment as well as physical therapy.   Other medical conditions are listed as follows:   History of hypertension  History of depression     Orders: Orders Placed This Encounter  Procedures  . Sedimentation rate  . Urinalysis, Routine w reflex microscopic  . Rheumatoid factor  . Serum protein electrophoresis with reflex  . CBC with Differential/Platelet  . COMPLETE METABOLIC PANEL WITH GFR   Meds ordered this encounter  Medications  . pilocarpine (SALAGEN) 5 MG tablet    Sig: Take 5mg  by mouth three times daily, as needed.    Dispense:  90 tablet    Refill:  1    Face-to-face time spent with patient was 30 minutes. Greater than 50% of time was spent in counseling and coordination of care.  Follow-Up Instructions: Return in about 6 months (around 08/15/2018) for Polymyalgia Rheumatica, Osteoarthritis, Sjogren's syndrome, Osteoporosis.   Gearldine Bienenstock, PA-C  Note - This record has been created using Dragon software.  Chart creation errors have been sought, but may not always  have been located. Such creation errors do not reflect on  the  standard of medical care.

## 2018-02-15 ENCOUNTER — Encounter: Payer: Self-pay | Admitting: Physician Assistant

## 2018-02-15 ENCOUNTER — Ambulatory Visit: Payer: Federal, State, Local not specified - PPO | Admitting: Physician Assistant

## 2018-02-15 VITALS — BP 131/62 | HR 65 | Resp 16 | Ht 62.5 in | Wt 129.0 lb

## 2018-02-15 DIAGNOSIS — M3501 Sicca syndrome with keratoconjunctivitis: Secondary | ICD-10-CM

## 2018-02-15 DIAGNOSIS — M810 Age-related osteoporosis without current pathological fracture: Secondary | ICD-10-CM | POA: Diagnosis not present

## 2018-02-15 DIAGNOSIS — M25531 Pain in right wrist: Secondary | ICD-10-CM

## 2018-02-15 DIAGNOSIS — M353 Polymyalgia rheumatica: Secondary | ICD-10-CM

## 2018-02-15 DIAGNOSIS — M19042 Primary osteoarthritis, left hand: Secondary | ICD-10-CM | POA: Diagnosis not present

## 2018-02-15 DIAGNOSIS — M5136 Other intervertebral disc degeneration, lumbar region: Secondary | ICD-10-CM

## 2018-02-15 DIAGNOSIS — M19072 Primary osteoarthritis, left ankle and foot: Secondary | ICD-10-CM

## 2018-02-15 DIAGNOSIS — M7062 Trochanteric bursitis, left hip: Secondary | ICD-10-CM | POA: Diagnosis not present

## 2018-02-15 DIAGNOSIS — M19041 Primary osteoarthritis, right hand: Secondary | ICD-10-CM | POA: Diagnosis not present

## 2018-02-15 DIAGNOSIS — M19071 Primary osteoarthritis, right ankle and foot: Secondary | ICD-10-CM | POA: Diagnosis not present

## 2018-02-15 DIAGNOSIS — Z8679 Personal history of other diseases of the circulatory system: Secondary | ICD-10-CM

## 2018-02-15 DIAGNOSIS — Z8659 Personal history of other mental and behavioral disorders: Secondary | ICD-10-CM

## 2018-02-15 DIAGNOSIS — M25532 Pain in left wrist: Secondary | ICD-10-CM

## 2018-02-15 DIAGNOSIS — M51369 Other intervertebral disc degeneration, lumbar region without mention of lumbar back pain or lower extremity pain: Secondary | ICD-10-CM

## 2018-02-15 MED ORDER — PILOCARPINE HCL 5 MG PO TABS: ORAL_TABLET | ORAL | 1 refills | Status: DC

## 2018-02-15 NOTE — Patient Instructions (Addendum)
Hand Exercises Hand exercises can be helpful to almost anyone. These exercises can strengthen the hands, improve flexibility and movement, and increase blood flow to the hands. These results can make work and daily tasks easier. Hand exercises can be especially helpful for people who have joint pain from arthritis or have nerve damage from overuse (carpal tunnel syndrome). These exercises can also help people who have injured a hand. Most of these hand exercises are fairly gentle stretching routines. You can do them often throughout the day. Still, it is a good idea to ask your health care provider which exercises would be best for you. Warming your hands before exercise may help to reduce stiffness. You can do this with gentle massage or by placing your hands in warm water for 15 minutes. Also, make sure you pay attention to your level of hand pain as you begin an exercise routine. Exercises Knuckle Bend Repeat this exercise 5-10 times with each hand. 1. Stand or sit with your arm, hand, and all five fingers pointed straight up. Make sure your wrist is straight. 2. Gently and slowly bend your fingers down and inward until the tips of your fingers are touching the tops of your palm. 3. Hold this position for a few seconds. 4. Extend your fingers out to their original position, all pointing straight up again.  Finger Fan Repeat this exercise 5-10 times with each hand. 1. Hold your arm and hand out in front of you. Keep your wrist straight. 2. Squeeze your hand into a fist. 3. Hold this position for a few seconds. 4. Fan out, or spread apart, your hand and fingers as much as possible, stretching every joint fully.  Tabletop Repeat this exercise 5-10 times with each hand. 1. Stand or sit with your arm, hand, and all five fingers pointed straight up. Make sure your wrist is straight. 2. Gently and slowly bend your fingers at the knuckles where they meet the hand until your hand is making an  upside-down L shape. Your fingers should form a tabletop. 3. Hold this position for a few seconds. 4. Extend your fingers out to their original position, all pointing straight up again.  Making Os Repeat this exercise 5-10 times with each hand. 1. Stand or sit with your arm, hand, and all five fingers pointed straight up. Make sure your wrist is straight. 2. Make an O shape by touching your pointer finger to your thumb. Hold for a few seconds. Then open your hand wide. 3. Repeat this motion with each finger on your hand.  Table Spread Repeat this exercise 5-10 times with each hand. 1. Place your hand on a table with your palm facing down. Make sure your wrist is straight. 2. Spread your fingers out as much as possible. Hold this position for a few seconds. 3. Slide your fingers back together again. Hold for a few seconds.  Ball Grip  Repeat this exercise 10-15 times with each hand. 1. Hold a tennis ball or another soft ball in your hand. 2. While slowly increasing pressure, squeeze the ball as hard as possible. 3. Squeeze as hard as you can for 3-5 seconds. 4. Relax and repeat.  Wrist Curls Repeat this exercise 10-15 times with each hand. 1. Sit in a chair that has armrests. 2. Hold a light weight in your hand, such as a dumbbell that weighs 1-3 pounds (0.5-1.4 kg). Ask your health care provider what weight would be best for you. 3. Rest your hand just over   the end of the chair arm with your palm facing up. 4. Gently pivot your wrist up and down while holding the weight. Do not twist your wrist from side to side.  Contact a health care provider if:  Your hand pain or discomfort gets much worse when you do an exercise.  Your hand pain or discomfort does not improve within 2 hours after you exercise. If you have any of these problems, stop doing these exercises right away. Do not do them again unless your health care provider says that you can. Get help right away if:  You  develop sudden, severe hand pain. If this happens, stop doing these exercises right away. Do not do them again unless your health care provider says that you can. This information is not intended to replace advice given to you by your health care provider. Make sure you discuss any questions you have with your health care provider. Document Released: 11/23/2015 Document Revised: 05/19/2016 Document Reviewed: 06/22/2015 Elsevier Interactive Patient Education  2018 [employer] Trochanteric Bursitis Rehab Ask your health care provider which exercises are safe for you. Do exercises exactly as told by your health care provider and adjust them as directed. It is normal to feel mild stretching, pulling, tightness, or discomfort as you do these exercises, but you should stop right away if you feel sudden pain or your pain gets worse.Do not begin these exercises until told by your health care provider. Stretching exercises These exercises warm up your muscles and joints and improve the movement and flexibility of your hip. These exercises also help to relieve pain and stiffness. Exercise A: Iliotibial band stretch  1. Lie on your side with your left / right leg in the top position. 2. Bend your left / right knee and grab your ankle. 3. Slowly bring your knee back so your thigh is behind your body. 4. Slowly lower your knee toward the floor until you feel a gentle stretch on the outside of your left / right thigh. If you do not feel a stretch and your knee will not fall farther, place the heel of your other foot on top of your outer knee and pull your thigh down farther. 5. Hold this position for __________ seconds. 6. Slowly return to the starting position. Repeat __________ times. Complete this exercise __________ times a day. Strengthening exercises These exercises build strength and endurance in your hip and pelvis. Endurance is the ability to use your muscles for a long time, even after they get  tired. Exercise B: Bridge ( hip extensors) 1. Lie on your back on a firm surface with your knees bent and your feet flat on the floor. 2. Tighten your buttocks muscles and lift your buttocks off the floor until your trunk is level with your thighs. You should feel the muscles working in your buttocks and the back of your thighs. If this exercise is too easy, try doing it with your arms crossed over your chest. 3. Hold this position for __________ seconds. 4. Slowly return to the starting position. 5. Let your muscles relax completely between repetitions. Repeat __________ times. Complete this exercise __________ times a day. Exercise C: Squats ( knee extensors and  quadriceps) 1. Stand in front of a table, with your feet and knees pointing straight ahead. You may rest your hands on the table for balance but not for support. 2. Slowly bend your knees and lower your hips like you are going to sit in a chair. ? Keep   your weight over your heels, not over your toes. ? Keep your lower legs upright so they are parallel with the table legs. ? Do not let your hips go lower than your knees. ? Do not bend lower than told by your health care provider. ? If your hip pain increases, do not bend as low. 3. Hold this position for __________ seconds. 4. Slowly push with your legs to return to standing. Do not use your hands to pull yourself to standing. Repeat __________ times. Complete this exercise __________ times a day. Exercise D: Hip hike 1. Stand sideways on a bottom step. Stand on your left / right leg with your other foot unsupported next to the step. You can hold onto the railing or wall if needed for balance. 2. Keeping your knees straight and your torso square, lift your left / right hip up toward the ceiling. 3. Hold this position for __________ seconds. 4. Slowly let your left / right hip lower toward the floor, past the starting position. Your foot should get closer to the floor. Do not lean  or bend your knees. Repeat __________ times. Complete this exercise __________ times a day. Exercise E: Single leg stand 1. Stand near a counter or door frame that you can hold onto for balance as needed. It is helpful to stand in front of a mirror for this exercise so you can watch your hip. 2. Squeeze your left / right buttock muscles then lift up your other foot. Do not let your left / right hip push out to the side. 3. Hold this position for __________ seconds. Repeat __________ times. Complete this exercise __________ times a day. This information is not intended to replace advice given to you by your health care provider. Make sure you discuss any questions you have with your health care provider. Document Released: 01/19/2005 Document Revised: 08/18/2016 Document Reviewed: 11/27/2015 Elsevier Interactive Patient Education  2018 [employer]  

## 2018-02-21 LAB — COMPLETE METABOLIC PANEL WITH GFR
AG Ratio: 1.5 (calc) (ref 1.0–2.5)
ALKALINE PHOSPHATASE (APISO): 47 U/L (ref 33–130)
ALT: 14 U/L (ref 6–29)
AST: 24 U/L (ref 10–35)
Albumin: 4.3 g/dL (ref 3.6–5.1)
BUN: 11 mg/dL (ref 7–25)
CALCIUM: 9.5 mg/dL (ref 8.6–10.4)
CO2: 30 mmol/L (ref 20–32)
Chloride: 102 mmol/L (ref 98–110)
Creat: 0.76 mg/dL (ref 0.60–0.93)
GFR, EST NON AFRICAN AMERICAN: 76 mL/min/{1.73_m2} (ref >=60)
GFR, Est African American: 88 mL/min/{1.73_m2} (ref >=60)
GLOBULIN: 2.8 g/dL (ref 1.9–3.7)
GLUCOSE: 92 mg/dL (ref 65–99)
Potassium: 4.5 mmol/L (ref 3.5–5.3)
SODIUM: 138 mmol/L (ref 135–146)
Total Bilirubin: 0.6 mg/dL (ref 0.2–1.2)
Total Protein: 7.1 g/dL (ref 6.1–8.1)

## 2018-02-21 LAB — CBC WITH DIFFERENTIAL/PLATELET
Basophils Absolute: 41 cells/uL (ref 0–200)
Basophils Relative: 0.8 %
EOS PCT: 1 %
Eosinophils Absolute: 51 cells/uL (ref 15–500)
HCT: 36.3 % (ref 35.0–45.0)
HEMOGLOBIN: 12 g/dL (ref 11.7–15.5)
LYMPHS ABS: 928 {cells}/uL (ref 850–3900)
MCH: 29.9 pg (ref 27.0–33.0)
MCHC: 33.1 g/dL (ref 32.0–36.0)
MCV: 90.5 fL (ref 80.0–100.0)
MONOS PCT: 10.8 %
MPV: 11 fL (ref 7.5–12.5)
NEUTROS ABS: 3529 {cells}/uL (ref 1500–7800)
NEUTROS PCT: 69.2 %
Platelets: 195 10*3/uL (ref 140–400)
RBC: 4.01 10*6/uL (ref 3.80–5.10)
RDW: 12.6 % (ref 11.0–15.0)
Total Lymphocyte: 18.2 %
WBC mixed population: 551 cells/uL (ref 200–950)
WBC: 5.1 10*3/uL (ref 3.8–10.8)

## 2018-02-21 LAB — PROTEIN ELECTROPHORESIS, SERUM, WITH REFLEX
Albumin ELP: 4.4 g/dL (ref 3.8–4.8)
Alpha 1: 0.3 g/dL (ref 0.2–0.3)
Alpha 2: 0.8 g/dL (ref 0.5–0.9)
BETA GLOBULIN: 0.5 g/dL (ref 0.4–0.6)
Beta 2: 0.3 g/dL (ref 0.2–0.5)
GAMMA GLOBULIN: 1 g/dL (ref 0.8–1.7)
TOTAL PROTEIN: 7.2 g/dL (ref 6.1–8.1)

## 2018-02-21 LAB — URINALYSIS, ROUTINE W REFLEX MICROSCOPIC
Bilirubin Urine: NEGATIVE
Glucose, UA: NEGATIVE
Hgb urine dipstick: NEGATIVE
Ketones, ur: NEGATIVE
LEUKOCYTES UA: NEGATIVE
NITRITE: NEGATIVE
Protein, ur: NEGATIVE
SPECIFIC GRAVITY, URINE: 1.006 (ref 1.001–1.03)

## 2018-02-21 LAB — SEDIMENTATION RATE: Sed Rate: 17 mm/h (ref 0–30)

## 2018-02-21 LAB — RHEUMATOID FACTOR

## 2018-02-21 NOTE — Progress Notes (Signed)
UA reveals elevated urine pH.  Please notify patient and fax results to PCP.  She may need a repeat UA by PCP.  All other labs are WNL.

## 2018-03-14 ENCOUNTER — Other Ambulatory Visit: Payer: Self-pay | Admitting: Family Medicine

## 2018-03-14 DIAGNOSIS — Z1231 Encounter for screening mammogram for malignant neoplasm of breast: Secondary | ICD-10-CM

## 2018-04-27 ENCOUNTER — Telehealth: Payer: Self-pay | Admitting: *Deleted

## 2018-04-27 ENCOUNTER — Other Ambulatory Visit (HOSPITAL_COMMUNITY)
Admission: RE | Admit: 2018-04-27 | Discharge: 2018-04-27 | Disposition: A | Discharge status: Home / Self Care | Payer: Federal, State, Local not specified - PPO | Source: Ambulatory Visit | Attending: Obstetrics and Gynecology | Admitting: Obstetrics and Gynecology

## 2018-04-27 ENCOUNTER — Telehealth (HOSPITAL_COMMUNITY): Payer: Self-pay | Admitting: Psychology

## 2018-04-27 ENCOUNTER — Ambulatory Visit (INDEPENDENT_AMBULATORY_CARE_PROVIDER_SITE_OTHER): Payer: Federal, State, Local not specified - PPO | Admitting: Obstetrics and Gynecology

## 2018-04-27 ENCOUNTER — Other Ambulatory Visit: Payer: Self-pay

## 2018-04-27 ENCOUNTER — Encounter: Payer: Self-pay | Admitting: Obstetrics and Gynecology

## 2018-04-27 VITALS — BP 144/76 | HR 66 | Resp 18 | Ht 61.0 in | Wt 128.4 lb

## 2018-04-27 DIAGNOSIS — Z01419 Encounter for gynecological examination (general) (routine) without abnormal findings: Secondary | ICD-10-CM | POA: Diagnosis not present

## 2018-04-27 DIAGNOSIS — Z113 Encounter for screening for infections with a predominantly sexual mode of transmission: Secondary | ICD-10-CM | POA: Insufficient documentation

## 2018-04-27 DIAGNOSIS — Z90710 Acquired absence of both cervix and uterus: Secondary | ICD-10-CM | POA: Insufficient documentation

## 2018-04-27 DIAGNOSIS — Z87410 Personal history of cervical dysplasia: Secondary | ICD-10-CM | POA: Diagnosis not present

## 2018-04-27 MED ORDER — ESTROGENS, CONJUGATED 0.625 MG/GM VA CREA: TOPICAL_CREAM | VAGINAL | 1 refills | Status: DC

## 2018-04-27 NOTE — Telephone Encounter (Signed)
Call to patient to provide contact information requested during appointment. Left message to call back to [facility] at [address] [facility] office.

## 2018-04-27 NOTE — Patient Instructions (Signed)

## 2018-04-27 NOTE — Progress Notes (Signed)
77 y.o. G26P2004 Widowed Caucasian female here for annual exam.    Needs refill of vaginal Premarin cream.   Newly intimate with her partner.  Feels guilty about this.   Wants help to stop drinking ETOH.  Can drink up to 5 glasses of wine per day.  Sees pyschiatrist and psychologist but is unable to stop on her own.  Does not feel she can talk to her family about this.  PCP: Sigmund Hazel, MD     No LMP recorded. Patient has had a hysterectomy.           Sexually active: No.  The current method of family planning is status post hysterectomy -- ovaries remain.    Exercising: Yes.    walking and yoga Smoker:  no  Health Maintenance: Pap:  04-13-16 Neg; 2002 Neg History of abnormal Pap:  Yes, hx abnormal paps in her 30s and the reason for her hysterectomy MMG:  05-02-2017 BiRads 1:Neg:solis--appt. Next week Colonoscopy:  2018 normal per patient--no need for further testing. BMD:   11/2017   Result:  Osteopenia, rheumatology following.  TDaP:  2011 Gardasil:   no HIV:no Hep C:no Screening Labs:  Hb today: PCP    reports that she has never smoked. She has never used smokeless tobacco. She reports that she drinks about 0.6 oz of alcohol per week. She reports that she does not use drugs.  Past Medical History:  Diagnosis Date  . Abnormal Pap smear of cervix    --hx abnormal paps in her 30s and per patient this was reason for her Hysterectomy  . Depression   . Dry eye   . Hypertension   . Osteoarthritis    hands  . Osteoporosis   . Polymyalgia rheumatica (HCC)     Past Surgical History:  Procedure Laterality Date  . BREAST SURGERY  2016   benign mass removed in [place], [place].  Marland Kitchen FACIAL COSMETIC SURGERY    . TOTAL VAGINAL HYSTERECTOMY  age 65   --due to abnormal pap smears per patient    Current Outpatient Medications  Medication Sig Dispense Refill  . Acetaminophen (TYLENOL PO) Take by mouth at bedtime.    Marland Kitchen amLODipine (NORVASC) 10 MG tablet Take 1 tablet by mouth  daily.    . B Complex-Biotin-FA (B-COMPLEX PO) Take 1 tablet by mouth daily.     . Calcium Carbonate-Vitamin D3 (CALCIUM 600-D) 600-400 MG-UNIT TABS Take 1 tablet by mouth 2 (two) times daily.    Marland Kitchen conjugated estrogens (PREMARIN) vaginal cream Place 1/2 gram per vagina at bedtime twice weekly. 30 g 1  . cycloSPORINE (RESTASIS) 0.05 % ophthalmic emulsion 1 drop 2 (two) times daily.    Marland Kitchen denosumab (PROLIA) 60 MG/ML SOLN injection Inject 60 mg into the skin every 6 (six) months. Administer in upper arm, thigh, or abdomen    . ferrous sulfate 325 (65 FE) MG EC tablet Take 325 mg by mouth daily with breakfast.    . latanoprost (XALATAN) 0.005 % ophthalmic solution Place 1 drop into both eyes 3 (three) times daily.    Marland Kitchen LORazepam (ATIVAN) 1 MG tablet Take 1 mg by mouth at bedtime.     . metoprolol succinate (TOPROL-XL) 50 MG 24 hr tablet Take 50 mg by mouth every morning.     . Multiple Vitamins-Minerals (MULTIVITAMIN PO) Take 1 tablet by mouth daily.     . Omega-3 Fatty Acids (FISH OIL PO) Take 1 capsule by mouth daily.     . pilocarpine (SALAGEN)  5 MG tablet Take  by mouth three times daily, as needed. 90 tablet 1  . SUBVENITE 100 MG tablet Take 100 mg by mouth 2 (two) times daily.  0   No current facility-administered medications for this visit.     Family History  Problem Relation Age of Onset  . Congestive Heart Failure Mother   . Stroke Mother   . Diabetes Father   . Colon cancer Brother   . Diabetes Brother   . Diabetes Brother   . Stroke Brother   . Colon cancer Daughter     Review of Systems  Constitutional: Negative.   HENT: Positive for hearing loss.   Eyes: Negative.   Respiratory: Negative.   Cardiovascular: Negative.   Gastrointestinal: Negative.   Endocrine: Negative.   Genitourinary: Negative.   Musculoskeletal: Positive for joint swelling.  Skin: Negative.   Allergic/Immunologic: Negative.   Neurological:       Difficulty with memory  Hematological: Negative.    Psychiatric/Behavioral:       Depression.    Exam:   BP (!) 144/76 (BP Location: Right Arm, Patient Position: Sitting, Cuff Size: Normal)   Pulse 66   Resp 18   Ht  (1.549 m)   Wt 128 lb 6.4 oz (58.2 kg)   BMI 24.26 kg/m     General appearance: alert, cooperative and appears stated age Head: Normocephalic, without obvious abnormality, atraumatic Neck: no adenopathy, supple, symmetrical, trachea midline and thyroid normal to inspection and palpation Lungs: clear to auscultation bilaterally Breasts: normal appearance, no masses or tenderness, No nipple retraction or dimpling, No nipple discharge or bleeding, No axillary or supraclavicular adenopathy Heart: regular rate and rhythm Abdomen: soft, non-tender; no masses, no organomegaly Extremities: extremities normal, atraumatic, no cyanosis or edema Skin: Skin color, texture, turgor normal. No rashes or lesions Lymph nodes: Cervical, supraclavicular, and axillary nodes normal. No abnormal inguinal nodes palpated Neurologic: Grossly normal  Pelvic: External genitalia:  no lesions              Urethra:  normal appearing urethra with no masses, tenderness or lesions              Bartholins and Skenes: normal                 Vagina: normal appearing vagina with normal color and discharge, no lesions              Cervix: absent              Pap taken: Yes.   Bimanual Exam:  Uterus:   Absent.              Adnexa: no mass, fullness, tenderness              Rectal exam: Yes.  .  Confirms.              Anus:  normal sphincter tone, no lesions  Chaperone was present for exam.  Assessment:   Well woman visit with normal exam. Status post TVH due to abnormal paps.  Ovaries remain. History of left breast biopsy. Benign per patient.  Osteoporosis. On Prolia.  Vaginal atrophy.  STD screening.  ETOH use.   Plan: Mammogram screening.  Appt next week. Recommended self breast awareness. Pap and HR HPV as above. Guidelines for  Calcium, Vitamin D, regular exercise program including cardiovascular and weight bearing exercise. Refill of Premarin vaginal cream. STD screening.  Will refer for ETOH addiction counseling/tx program. Follow  up annually and prn.   After visit summary provided.

## 2018-04-28 LAB — HEP, RPR, HIV PANEL
HEP B S AG: NEGATIVE
HIV SCREEN 4TH GENERATION: NONREACTIVE
RPR: NONREACTIVE

## 2018-04-28 LAB — HEPATITIS C ANTIBODY

## 2018-04-30 LAB — CYTOLOGY - PAP
CHLAMYDIA, DNA PROBE: NEGATIVE
Diagnosis: NEGATIVE
NEISSERIA GONORRHEA: NEGATIVE
TRICH (WINDOWPATH): NEGATIVE

## 2018-05-01 NOTE — Telephone Encounter (Signed)
Call to patient. She is on vacation ( advised me of this last week and said would have limited ability to talk).  Advised I have a contact number for her and someone was to reach out to her last Friday. Patient confirmed she has received call and has made an appointment but will need to reschedule.  Advised to call office if rescheduling assistance needed when she returns.    Routing to provider for final review. Patient agreeable to disposition. Will close encounter.

## 2018-05-03 ENCOUNTER — Telehealth: Payer: Self-pay | Admitting: Obstetrics and Gynecology

## 2018-05-03 NOTE — Telephone Encounter (Signed)
Spoke with patient. Results given. Patient verbalizes understanding.  Notes recorded by Lum Babe, CMA on 05/02/2018 at 4:13 PM EDT Tried calling patient, no answer, per DPR okay to leave a detailed message at number listed. Message left and advised patient if she has any questions to give [place] a call at [phone] ------  Notes recorded by Patton Salles, MD on 05/02/2018 at 12:46 PM EDT Please report to patient her normal pap and negative testing for infectious disease.  She tested negative for HIV, syphilis, hepatitis B and C, trichomonas, gonorrhea, and chlamydia.

## 2018-05-03 NOTE — Telephone Encounter (Signed)
Patient is returning a call from yesterday.

## 2018-05-07 ENCOUNTER — Telehealth: Payer: Self-pay | Admitting: Obstetrics and Gynecology

## 2018-05-07 ENCOUNTER — Ambulatory Visit: Payer: Federal, State, Local not specified - PPO

## 2018-05-07 ENCOUNTER — Ambulatory Visit
Admission: RE | Admit: 2018-05-07 | Discharge: 2018-05-07 | Disposition: A | Discharge status: Home / Self Care | Payer: Federal, State, Local not specified - PPO | Source: Ambulatory Visit | Attending: Family Medicine | Admitting: Family Medicine

## 2018-05-07 DIAGNOSIS — Z1231 Encounter for screening mammogram for malignant neoplasm of breast: Secondary | ICD-10-CM

## 2018-05-07 NOTE — Telephone Encounter (Signed)
Patient asking to speak with Kennon Rounds about an appointment to a referring provider that was not scheduled.

## 2018-05-07 NOTE — Telephone Encounter (Signed)
Return call to patient. Left message to call back. 

## 2018-05-07 NOTE — Telephone Encounter (Signed)
Routing to S. Yeakley, RN.  

## 2018-05-08 ENCOUNTER — Ambulatory Visit (HOSPITAL_COMMUNITY): Payer: Federal, State, Local not specified - PPO | Admitting: Psychology

## 2018-05-08 NOTE — Telephone Encounter (Signed)
Call to patient. States she was attempting to confirm appointment information but has since received a reminder call. States has an appointment scheduled this Friday.   Routing to provider for final review.  Will close encounter.

## 2018-05-11 ENCOUNTER — Encounter (HOSPITAL_COMMUNITY): Payer: Self-pay | Admitting: Psychology

## 2018-05-11 ENCOUNTER — Ambulatory Visit (INDEPENDENT_AMBULATORY_CARE_PROVIDER_SITE_OTHER): Payer: Federal, State, Local not specified - PPO | Admitting: Psychology

## 2018-05-11 DIAGNOSIS — Z62810 Personal history of physical and sexual abuse in childhood: Secondary | ICD-10-CM

## 2018-05-11 DIAGNOSIS — Z62811 Personal history of psychological abuse in childhood: Secondary | ICD-10-CM

## 2018-05-11 DIAGNOSIS — F1010 Alcohol abuse, uncomplicated: Secondary | ICD-10-CM | POA: Diagnosis not present

## 2018-05-11 DIAGNOSIS — Z91411 Personal history of adult psychological abuse: Secondary | ICD-10-CM

## 2018-05-11 DIAGNOSIS — F1099 Alcohol use, unspecified with unspecified alcohol-induced disorder: Secondary | ICD-10-CM

## 2018-05-11 DIAGNOSIS — Z62898 Other specified problems related to upbringing: Secondary | ICD-10-CM
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
## 2018-07-17 ENCOUNTER — Telehealth (HOSPITAL_COMMUNITY): Payer: Self-pay | Admitting: Psychology

## 2018-07-20 ENCOUNTER — Telehealth (HOSPITAL_COMMUNITY): Payer: Self-pay | Admitting: Psychology

## 2018-08-13 NOTE — Progress Notes (Signed)
Office Visit Note  Patient: Christine Scott             Date of Birth: July 08, 1941           MRN: [id number]             PCP: Sigmund Hazel, MD Referring: Sigmund Hazel, MD Visit Date: 08/14/2018 Occupation: @GUAROCC @  Subjective:  Lower extremity muscle pain   History of Present Illness: Christine Scott is a 77 y.o. female with history of PMR, Sjogren's, osteoarthritis, osteoporosis, and DDD.  Patient states she has been having increased lower extremity muscle aches and feet pain.  She states the pain is similar to when she was diagnosed with PMR but the pain is less severe.  She states she is not having any hip or shoulder joint pain at this time.  She is not having any difficulty getting up from a chair or raising arms above her head.  She denies any recent headaches, blurry vision, or temple tenderness. She states she has been having increased fatigue.  She continues to walk 2 miles daily.  She continues to have intermittent symptoms of right trochanteric bursitis.  She reports she continues to have hand stiffness bilaterally.  She has been having increased left wrist pain.  She previously fracture the left wrist.  She denies any wrist joint swelling.  She continues to have sicca symptoms.  She was apprehensive to use pilocarpine due to potential side effects.  She uses biotene products and eye drops.  She continues to follow up with dentist regularly.  She denies any recent dental caries.  She continues to have prolia injections every 6 months.     Activities of Daily Living:  Patient reports morning stiffness for 5  minutes.   Patient Reports nocturnal pain.  Difficulty dressing/grooming: Denies Difficulty climbing stairs: Denies Difficulty getting out of chair: Denies Difficulty using hands for taps, buttons, cutlery, and/or writing: Denies  Review of Systems  Constitutional: Positive for fatigue.  HENT: Positive for mouth dryness. Negative for mouth sores and nose dryness.   Eyes:  Positive for dryness. Negative for pain and visual disturbance.  Respiratory: Negative for cough, hemoptysis, shortness of breath and difficulty breathing.   Cardiovascular: Negative for chest pain, palpitations, hypertension and swelling in legs/feet.  Gastrointestinal: Negative for blood in stool, constipation and diarrhea.  Endocrine: Negative for increased urination.  Genitourinary: Negative for painful urination.  Musculoskeletal: Positive for arthralgias, joint pain, myalgias, morning stiffness, muscle tenderness and myalgias. Negative for joint swelling and muscle weakness.  Skin: Negative for color change, pallor, rash, hair loss, nodules/bumps, skin tightness, ulcers and sensitivity to sunlight.  Allergic/Immunologic: Negative for susceptible to infections.  Neurological: Negative for dizziness, numbness, headaches and weakness.  Hematological: Negative for swollen glands.  Psychiatric/Behavioral: Positive for depressed mood. Negative for sleep disturbance. The patient is nervous/anxious (Takes Ativan at bedtime).     PMFS History:  Patient Active Problem List   Diagnosis Date Noted  . Alcohol use disorder, mild, abuse 05/11/2018  . History of depression 08/12/2017  . Essential hypertension 08/12/2017  . Lumbar radiculopathy 06/15/2017  . Acute pain of right knee 05/01/2017  . Sjogren's syndrome 12/25/2016  . 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 Primary osteoarthritis of both feet 12/25/2016  . Osteoporosis 12/25/2016    Past Medical History:  Diagnosis Date  . Abnormal Pap smear of cervix    --hx abnormal paps in her 30s and per patient  this was reason for her Hysterectomy  . Depression   . Dry eye   . Hypertension   . Osteoarthritis    hands  . Osteoporosis   . Polymyalgia rheumatica (HCC)     Family History  Problem Relation Age of Onset  . Congestive Heart Failure Mother   . Stroke Mother   . Diabetes  Father   . Colon cancer Brother   . Diabetes Brother   . Diabetes Brother   . Stroke Brother   . Colon cancer Daughter    Past Surgical History:  Procedure Laterality Date  . BREAST SURGERY  2016   benign mass removed in [place], [place].  Marland Kitchen FACIAL COSMETIC SURGERY    . TOTAL VAGINAL HYSTERECTOMY  age 31   --due to abnormal pap smears per patient   Social History   Social History Narrative  . Not on file    Objective: Vital Signs: BP 113/62 (BP Location: Left Arm, Patient Position: Sitting, Cuff Size: Normal)   Pulse (!) 56   Resp 12   Ht 5\' 2"  (1.575 m)   Wt 130 lb (59 kg)   BMI 23.78 kg/m    Physical Exam  Constitutional: She is oriented to person, place, and time. She appears well-developed and well-nourished.  HENT:  Head: Normocephalic and atraumatic.  No parotid swelling.   Eyes: Conjunctivae and EOM are normal.  Neck: Normal range of motion.  Cardiovascular: Normal rate, regular rhythm, normal heart sounds and intact distal pulses.  Pulmonary/Chest: Effort normal and breath sounds normal.  Abdominal: Soft. Bowel sounds are normal.  Lymphadenopathy:    She has no cervical adenopathy.  Neurological: She is alert and oriented to person, place, and time.  Skin: Skin is warm and dry. Capillary refill takes less than 2 seconds.  Psychiatric: She has a normal mood and affect. Her behavior is normal.  Nursing note and vitals reviewed.    Musculoskeletal Exam: C-spine limited ROM with lateral rotation.  Thoracic and lumbar spine good ROM.  No midline spinal tenderness.  No SI joint tenderness.   Shoulder joints, elbow joints, wrist joints, MCPs, PIPs, and DIPs good ROM with no synovitis.  She has complete fist formation bilaterally.  PIP and DIP synovial thickening consistent with osteoarthritis.  She has bilateral CMC joint synovial thickening.  Hip joints, knee joints, ankle joints, MTPs, PIPs, DIPs good range of motion with no synovitis.  No warmth or effusion of  bilateral knee joints.  No knee crepitus.  No tenderness of trochanteric bursa on exam today.  She has mild osteoarthritic changes of bilateral feet.  CDAI Exam: CDAI Score: Not documented Patient Global Assessment: Not documented; Provider Global Assessment: Not documented Swollen: Not documented; Tender: Not documented Joint Exam   Not documented   There is currently no information documented on the homunculus. Go to the Rheumatology activity and complete the homunculus joint exam.  Investigation: No additional findings.  Imaging: No results found.  Recent Labs: Lab Results  Component Value Date   WBC 5.1 02/15/2018   HGB 12.0 02/15/2018   PLT 195 02/15/2018   NA 138 02/15/2018   K 4.5 02/15/2018   CL 102 02/15/2018   CO2 30 02/15/2018   GLUCOSE 92 02/15/2018   BUN 11 02/15/2018   CREATININE 0.76 02/15/2018   BILITOT 0.6 02/15/2018   ALKPHOS 43 08/15/2017   AST 24 02/15/2018   ALT 14 02/15/2018   PROT 7.2 02/15/2018   PROT 7.1 02/15/2018   ALBUMIN 3.8 08/15/2017  CALCIUM 9.5 02/15/2018   GFRAA 88 02/15/2018    Speciality Comments: No specialty comments available.  Procedures:  No procedures performed Allergies: Patient has no known allergies.   Assessment / Plan:     Visit Diagnoses: Polymyalgia rheumatica (HCC): She declined sed rate at this time.  She is not having any difficulty getting up from a chair or raising arms above her head.  She has not on prednisone at this time. She has no temporal artery tenderness, headaches, or blurry vision.  She has full strength of upper and lower extremities on exam.  She has no muscle aches or tenderness at this time.  She has been having increased feet pain and pain in both lower extremities.  We discussed the importance of wearing proper fitting shoes.  She has no hip joint pain or shoulder joint pain at this time.  She was advised to notify us if she develops new or worsening symptoms.  She will follow up in 5 months.    Sjogren's syndrome - She continues to have sicca symptoms.  She sees her dentist on a regular basis. She has not had any recent dental caries.  She has no parotid swelling on exam.  She uses OTC biotene products and eye drops.  A prescription for pilocarpine was provided to the patient at her last visit.  She was apprehensive of side effects and did not start it.  We reviewed the potential side effects again today.  She is going to try taking 1/2 tablet daily to see if she tolerates it.    Primary osteoarthritis of both hands: She has PIP and DIP synovial thickening consistent with osteoarthritis.  She has complete fist formation bilaterally.  Joint protection and muscle strengthening were discussed.    Primary osteoarthritis of both feet: She has osteoarthritic changes in bilateral feet. She has been having increased discomfort in both feet.  We discussed the importance of wearing proper fitting shoes.    Osteoporosis - She has a history of osteoporosis that has improved on Prolia.  She has been on prolia for 3-4 years.  She would like to continue on prolia until after her next DEXA.  She will be due for a bone density scan in December 2020.    DDD (degenerative disc disease), lumbar: She has no midline spinal tenderness.  She has no lower back pain at this time.    Trochanteric bursitis, left hip: She has no tenderness at this time.  She has intermittent symptoms of left trochanteric bursitis.  She walks 2 miles daily, which occasionally causes worsening symptoms.   Other medical conditions are listed as follows:   History of depression  History of hypertension   Orders: No orders of the defined types were placed in this encounter.  No orders of the defined types were placed in this encounter.   Face-to-face time spent with patient was 30 minutes. Greater than 50% of time was spent in counseling and coordination of care.  Follow-Up Instructions: Return in about 6 months (around  02/14/2019) for Polymyalgia Rheumatica, Sjogren's syndrome, Osteoarthritis.   Gearldine Bienenstockaylor M Dale, PA-C   I examined and evaluated the patient with Sherron Alesaylor Dale PA.  Patient had no muscular weakness or tenderness on my examination today.  We discussed the use of pilocarpine and side effects were reviewed at length.  She states she has the pilocarpine at home and she will start the medication.  She was also having some discomfort in her bilateral feet.  She has  osteoarthritic changes in her feet.  Proper fitting shoes were discussed.  The plan of care was discussed as noted above.  Shaili Deveshwar, MPollyann Savoy  Note - This record has been created using Animal nutritionistDragon software.  Chart creation errors have been sought, but may not always  have been located. Such creation errors do not reflect on  the standard of medical care.

## 2018-08-14 ENCOUNTER — Encounter: Payer: Self-pay | Admitting: Rheumatology

## 2018-08-14 ENCOUNTER — Ambulatory Visit: Payer: Federal, State, Local not specified - PPO | Admitting: Rheumatology

## 2018-08-14 VITALS — BP 113/62 | HR 56 | Resp 12 | Ht 62.0 in | Wt 130.0 lb

## 2018-08-14 DIAGNOSIS — M3501 Sicca syndrome with keratoconjunctivitis: Secondary | ICD-10-CM

## 2018-08-14 DIAGNOSIS — M7062 Trochanteric bursitis, left hip: Secondary | ICD-10-CM

## 2018-08-14 DIAGNOSIS — M19041 Primary osteoarthritis, right hand: Secondary | ICD-10-CM

## 2018-08-14 DIAGNOSIS — M51369 Other intervertebral disc degeneration, lumbar region without mention of lumbar back pain or lower extremity pain: Secondary | ICD-10-CM

## 2018-08-14 DIAGNOSIS — M353 Polymyalgia rheumatica: Secondary | ICD-10-CM | POA: Diagnosis not present

## 2018-08-14 DIAGNOSIS — M19071 Primary osteoarthritis, right ankle and foot: Secondary | ICD-10-CM | POA: Diagnosis not present

## 2018-08-14 DIAGNOSIS — M19072 Primary osteoarthritis, left ankle and foot: Secondary | ICD-10-CM

## 2018-08-14 DIAGNOSIS — M5136 Other intervertebral disc degeneration, lumbar region: Secondary | ICD-10-CM

## 2018-08-14 DIAGNOSIS — M19042 Primary osteoarthritis, left hand: Secondary | ICD-10-CM

## 2018-08-14 DIAGNOSIS — Z8679 Personal history of other diseases of the circulatory system: Secondary | ICD-10-CM

## 2018-08-14 DIAGNOSIS — Z8659 Personal history of other mental and behavioral disorders: Secondary | ICD-10-CM

## 2018-08-14 DIAGNOSIS — M810 Age-related osteoporosis without current pathological fracture: Secondary | ICD-10-CM

## 2018-08-16 ENCOUNTER — Ambulatory Visit: Payer: Federal, State, Local not specified - PPO | Admitting: Rheumatology

## 2018-08-17 ENCOUNTER — Ambulatory Visit: Payer: Federal, State, Local not specified - PPO | Admitting: Family Medicine

## 2018-08-17 ENCOUNTER — Encounter: Payer: Self-pay | Admitting: Family Medicine

## 2018-08-17 VITALS — BP 132/82 | HR 63 | Temp 97.9°F | Ht 62.0 in | Wt 128.6 lb

## 2018-08-17 DIAGNOSIS — F339 Major depressive disorder, recurrent, unspecified: Secondary | ICD-10-CM | POA: Diagnosis not present

## 2018-08-17 DIAGNOSIS — F1010 Alcohol abuse, uncomplicated: Secondary | ICD-10-CM

## 2018-08-17 DIAGNOSIS — Z0000 Encounter for general adult medical examination without abnormal findings: Secondary | ICD-10-CM

## 2018-08-17 DIAGNOSIS — M810 Age-related osteoporosis without current pathological fracture: Secondary | ICD-10-CM | POA: Diagnosis not present

## 2018-08-17 DIAGNOSIS — I10 Essential (primary) hypertension: Secondary | ICD-10-CM

## 2018-08-17 DIAGNOSIS — F419 Anxiety disorder, unspecified: Secondary | ICD-10-CM

## 2018-08-17 DIAGNOSIS — Z23 Encounter for immunization: Secondary | ICD-10-CM | POA: Diagnosis not present

## 2018-08-17 LAB — LIPID PANEL
CHOL/HDL RATIO: 2
Cholesterol: 205 mg/dL — ABNORMAL HIGH (ref 0–200)
HDL: 82.4 mg/dL (ref >39.00)
LDL Cholesterol: 104 mg/dL — ABNORMAL HIGH (ref 0–99)
NONHDL: 122.42
Triglycerides: 94 mg/dL (ref 0.0–149.0)
VLDL: 18.8 mg/dL (ref 0.0–40.0)

## 2018-08-17 LAB — MICROALBUMIN / CREATININE URINE RATIO
Creatinine,U: 20.5 mg/dL
MICROALB/CREAT RATIO: 8.2 mg/g (ref 0.0–30.0)
Microalb, Ur: 1.7 mg/dL (ref 0.0–1.9)

## 2018-08-17 LAB — CBC WITH DIFFERENTIAL/PLATELET
BASOS PCT: 0.4 % (ref 0.0–3.0)
Basophils Absolute: 0 10*3/uL (ref 0.0–0.1)
EOS ABS: 0.1 10*3/uL (ref 0.0–0.7)
EOS PCT: 1 % (ref 0.0–5.0)
HCT: 37.2 % (ref 36.0–46.0)
HEMOGLOBIN: 12.5 g/dL (ref 12.0–15.0)
LYMPHS ABS: 1 10*3/uL (ref 0.7–4.0)
Lymphocytes Relative: 15.1 % (ref 12.0–46.0)
MCHC: 33.7 g/dL (ref 30.0–36.0)
MCV: 90.6 fl (ref 78.0–100.0)
Monocytes Absolute: 0.4 10*3/uL (ref 0.1–1.0)
Monocytes Relative: 6.7 % (ref 3.0–12.0)
NEUTROS PCT: 76.8 % (ref 43.0–77.0)
Neutro Abs: 5.1 10*3/uL (ref 1.4–7.7)
Platelets: 217 10*3/uL (ref 150.0–400.0)
RBC: 4.11 Mil/uL (ref 3.87–5.11)
RDW: 14 % (ref 11.5–15.5)
WBC: 6.6 10*3/uL (ref 4.0–10.5)

## 2018-08-17 LAB — COMPREHENSIVE METABOLIC PANEL
ALT: 11 U/L (ref 0–35)
AST: 20 U/L (ref 0–37)
Albumin: 4.5 g/dL (ref 3.5–5.2)
Alkaline Phosphatase: 55 U/L (ref 39–117)
BILIRUBIN TOTAL: 0.5 mg/dL (ref 0.2–1.2)
BUN: 14 mg/dL (ref 6–23)
CO2: 30 meq/L (ref 19–32)
Calcium: 10.6 mg/dL — ABNORMAL HIGH (ref 8.4–10.5)
Chloride: 96 mEq/L (ref 96–112)
Creatinine, Ser: 0.79 mg/dL (ref 0.40–1.20)
GFR: 74.75 mL/min (ref >60.00)
GLUCOSE: 100 mg/dL — AB (ref 70–99)
Potassium: 4.3 mEq/L (ref 3.5–5.1)
Sodium: 133 mEq/L — ABNORMAL LOW (ref 135–145)
Total Protein: 7.6 g/dL (ref 6.0–8.3)

## 2018-08-17 LAB — TSH: TSH: 1.41 u[IU]/mL (ref 0.35–4.50)

## 2018-08-17 LAB — VITAMIN D 25 HYDROXY (VIT D DEFICIENCY, FRACTURES): VITD: 84.77 ng/mL (ref 30.00–100.00)

## 2018-08-17 NOTE — Patient Instructions (Signed)
We are going to decrease your toprol pill down to 1/2 pill daily (25mg ). Will have you come back in 2 weeks for BP check. Keep a log for me and let me know if you are starting to have readings over 140/80.

## 2018-08-17 NOTE — Progress Notes (Signed)
Phone: [phone]  Subjective:  Patient presents today for their Medicare Exam  And follow up for other problems.   Hypertension: Here for follow up of hypertension.  Currently on norvasc and toprol-XL. Home readings range from  112-130 systolic/ 65 diastolic. Takes medication as prescribed and denies any side effects. Exercise includes walking. 2 miles at least 5 days/week. Weight has been stable. Denies any chest pain, headaches, shortness of breath, vision changes, swelling in lower extremities. She has noticed that she has much lower energy and fatigue and is more noticeable after she takes her BP pills in the AM. She gets light headed as well as tired. She has had some low readings and is wondering about this.   Osteoprosis: currently on prolia. Last DEXA 11/27/2017 with improvement in BMD since on prolia. Last prolia shot was 3 months ago. She has been on bisphophonate and could not tolerate it. Was tried on several of these.   Depression: She has been on medication and just recently stopped taking her anti depressant. She was taking medication by her psychiatrist. She stopped taking this about one month ago. Medication was for depression. She feels okay and her phq9 score today is much better. She has not followed up with her psychiatrist since stopping this. No si/hi/ah/vh.   Anxiety: She feels like she has anxiety in the AM after she takes her pills and sometimes throughout the day. She is in counseling and sees psychiatry. Not sure if ever on any medication for this, but could have been treated for both anxiety and depression.   Alcohol abuse: mild. She is doing much better. She has not had a drink in a week. She doesn't drink everyday, but when she does drink it's hard for her to just have one drink. She is seeing counseling. She has never had a problem with alcohol in the past. She does enjoy wine and only started to drink wine about 11 years ago. If she has 2 drinks she feels really  depressed. She also has regrets of some things in her marriage, but overall it was a great marriage. She doesn't wake up and need a drink, she doesn't drink and drive. She does have some guilt over it and feels like she shouldn't drink at all. No family members think she has had a problem, but she doesn't discuss with them.   Preventive Screening-Counseling & Management  Vision screen: 20/20 Both, 20/20/-L, 20/20-R No exam data present  Advanced directives: Yes  Smoking Status: Never Smoker Second Hand Smoking status: No smokers in home  Risk Factors Regular exercise: walks 2 miles 5-6x/week Diet: regular Fall Risk: None  Fall Risk  08/17/2018  Falls in the past year? No   Opioid use history:  no long term opioids use  Cardiac risk factors:  advanced age (older than 78 for men, 7865 for women) yes Hyperlipidemia no No diabetes.  Family History: father positive for diabetes, 2 brothers   Depression Screen None. PHQ9 Depression screen [facility] 2/9 08/17/2018 05/11/2018  Decreased Interest 1 1  Down, Depressed, Hopeless 1 3  PHQ - 2 Score 2 4  Altered sleeping 0 2  Tired, decreased energy 2 1  Change in appetite 0 0  Feeling bad or failure about yourself  1 3  Trouble concentrating 1 3  Moving slowly or fidgety/restless 0 3  Suicidal thoughts 0 0  PHQ-9 Score 6 16    Activities of Daily Living Independent ADLs and IADLs   Hearing Difficulties: -patient wears  HA bilaterally   Cognitive Testing No reported trouble.    Normal 3 word recall             Mini-cog: 5/5  List the Names of Other Physician/Practitioners you currently use: Dr. Titus Dubin Dr. Maurice March Dr. Duard Larsen  Immunization History  Administered Date(s) Administered  . Influenza, High Dose Seasonal PF 10/21/2017  . Pneumococcal Conjugate-13 08/17/2018  . Pneumococcal Polysaccharide-23 01/19/2016  . Tdap 12/26/2009   Required Immunizations needed today: pcv 13   Screening tests- up to  date Health Maintenance Due  Topic Date Due  . INFLUENZA VACCINE  07/26/2018  colonoscopy: done in last 2 years. No longer needs this.  Mmg: 04/2018  ROS- No pertinent positives discovered in course of AWV Review of Systems  Constitutional: Positive for malaise/fatigue. Negative for chills and fever.  HENT: Negative for hearing loss and sore throat.   Eyes: Negative for blurred vision and double vision.  Respiratory: Negative for cough, shortness of breath and wheezing.   Cardiovascular: Negative for chest pain, palpitations and leg swelling.  Gastrointestinal: Negative for abdominal pain, blood in stool, nausea and vomiting.  Genitourinary: Negative for dysuria and hematuria.  Musculoskeletal: Positive for joint pain. Negative for falls.  Skin: Negative for rash.  Neurological: Negative for dizziness, weakness and headaches.  Psychiatric/Behavioral: Positive for depression. Negative for memory loss and suicidal ideas. The patient is nervous/anxious. The patient does not have insomnia.     The following were reviewed and entered/updated in epic: Past Medical History:  Diagnosis Date  . Abnormal Pap smear of cervix    --hx abnormal paps in her 30s and per patient this was reason for her Hysterectomy  . Depression   . Dry eye   . Hypertension   . Osteoarthritis    hands  . Osteoporosis   . Polymyalgia rheumatica [facility])    Patient Active Problem List   Diagnosis Date Noted  . Alcohol use disorder, mild, abuse 05/11/2018  . History of depression 08/12/2017  . Essential hypertension 08/12/2017  . Lumbar radiculopathy 06/15/2017  . Sjogren's syndrome 12/25/2016  . 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 Primary osteoarthritis of both feet 12/25/2016  . Osteoporosis 12/25/2016   Past Surgical History:  Procedure Laterality Date  . BREAST SURGERY  2016   benign mass removed in [place], [place].  Marland Kitchen FACIAL COSMETIC SURGERY      . TOTAL VAGINAL HYSTERECTOMY  age 77   --due to abnormal pap smears per patient    Family History  Problem Relation Age of Onset  . Congestive Heart Failure Mother   . Stroke Mother   . Diabetes Father   . Colon cancer Brother   . Diabetes Brother   . Diabetes Brother   . Stroke Brother   . Colon cancer Daughter     Medications- reviewed and updated Current Outpatient Medications  Medication Sig Dispense Refill  . Acetaminophen (TYLENOL PO) Take by mouth at bedtime.    Marland Kitchen amLODipine (NORVASC) 10 MG tablet Take 1 tablet by mouth daily.    . B Complex-Biotin-FA (B-COMPLEX PO) Take 1 tablet by mouth daily.     . Calcium Carbonate-Vitamin D3 (CALCIUM 600-D) 600-400 MG-UNIT TABS Take 1 tablet by mouth 2 (two) times daily.    . Cholecalciferol (VITAMIN D3 PO) Take 1,200 Units by mouth daily.    Marland Kitchen conjugated estrogens (PREMARIN) vaginal cream Place 1/2 gram per vagina at bedtime twice weekly. 30 g 1  .  denosumab (PROLIA) 60 MG/ML SOLN injection Inject 60 mg into the skin every 6 (six) months. Administer in upper arm, thigh, or abdomen    . Ferrous Sulfate (IRON SLOW RELEASE) 140 (45 Fe) MG TBCR Take by mouth.    Marland Kitchen LORazepam (ATIVAN) 1 MG tablet Take 1 mg by mouth at bedtime.    . metoprolol succinate (TOPROL-XL) 50 MG 24 hr tablet Take 50 mg by mouth every morning.     . Multiple Vitamins-Minerals (MULTIVITAMIN PO) Take 1 tablet by mouth daily.     . Omega-3 Fatty Acids (FISH OIL PO) Take 1 capsule by mouth daily.     . pilocarpine (SALAGEN) 5 MG tablet Take by mouth 2 (two) times daily.    Bertram Gala Glycol-Propyl Glycol (SYSTANE ULTRA OP) Apply to eye.    Cliffton Asters Petrolatum-Mineral Oil (SYSTANE NIGHTTIME) OINT Apply to eye.     No current facility-administered medications for this visit.     Allergies-reviewed and updated No Known Allergies  Social History   Socioeconomic History  . Marital status: Widowed    Spouse name: Not on file  . Number of children: Not on file  .  Years of education: Not on file  . Highest education level: Not on file  Occupational History  . Not on file  Social Needs  . Financial resource strain: Not on file  . Food insecurity:    Worry: Not on file    Inability: Not on file  . Transportation needs:    Medical: Not on file    Non-medical: Not on file  Tobacco Use  . Smoking status: Never Smoker  . Smokeless tobacco: Never Used  Substance and Sexual Activity  . Alcohol use: Yes    Alcohol/week: 1.0 standard drinks    Types: 1 Standard drinks or equivalent per week    Comment: occasional--2 glasses of wine/month  . Drug use: Never  . Sexual activity: Never    Partners: Male    Birth control/protection: Surgical    Comment: TVH  Lifestyle  . Physical activity:    Days per week: Not on file    Minutes per session: Not on file  . Stress: Not on file  Relationships  . Social connections:    Talks on phone: Not on file    Gets together: Not on file    Attends religious service: Not on file    Active member of club or organization: Not on file    Attends meetings of clubs or organizations: Not on file    Relationship status: Not on file  Other Topics Concern  . Not on file  Social History Narrative  . Not on file    Objective: BP 132/82 (BP Location: Left Arm, Cuff Size: Normal)   Pulse 63   Temp 97.9 F (36.6 C) (Oral)   Ht 5\' 2"  (1.575 m)   Wt 128 lb 9.6 oz (58.3 kg)   LMP  (LMP Unknown)   SpO2 99%   BMI 23.52 kg/m   Physical Exam  Constitutional: She is oriented to person, place, and time and well-developed, well-nourished, and in no distress.  HENT:  Right Ear: External ear normal.  Left Ear: External ear normal.  Mouth/Throat: Oropharynx is clear and moist.  TM pearly with light reflex bilaterally   Eyes: Pupils are equal, round, and reactive to light. EOM are normal.  Neck: Normal range of motion. Neck supple. No thyromegaly present.  Cardiovascular: Normal rate, regular rhythm, normal heart  sounds  and intact distal pulses.  No murmur heard. Pulmonary/Chest: Effort normal and breath sounds normal.  Abdominal: Soft. Bowel sounds are normal.  Musculoskeletal: She exhibits no edema.  Lymphadenopathy:    She has no cervical adenopathy.  Neurological: She is alert and oriented to person, place, and time. She has normal reflexes. No cranial nerve deficit.  Skin: Skin is warm and dry.  Psychiatric: Affect and judgment normal.  Vitals reviewed.     Assessment/Plan:  Medicare exam completed- discussed recommended screenings anddocumented any personalized health advice and referrals for preventive counseling. See AVS as well which was given to patient.   Status of chronic or acute concerns hypertension. See below.    Future Appointments  Date Time Provider Department Center  09/06/2018  1:15 PM Orland Mustard, MD [facility]  02/14/2019  1:15 PM Pollyann Savoy, MD [facility] None  05/08/2019  2:00 PM Ardell Isaacs, Forrestine Him, MD [facility] None   Return in about 2 weeks (around 08/31/2018) for bp check with nurse .   Lab/Order associations: Medicare annual wellness visit, subsequent - Plan: Pneumococcal conjugate vaccine 13-valent  Essential hypertension - She has had some low readings. Blood pressure cuff checked-but needs new batteries so we could not check to see how accurate this was.  She is on a beta blocker which is not helping her fatigue. WE are going to cut her Toprol in 1/2 for 2 weeks. She will come back here for BP check by nurse. Continue her amlodipine. Discussed you can't just stop this and have to wean down. Will see what she is running on lower dose and then discuss stopping this or stopping this and adding on a different agent. For her age would be happy with 140/80. Routine lab work today as well.   Plan: CBC with Differential/Platelet, Comprehensive metabolic panel, Lipid panel, Microalbumin / creatinine urine ratio  Alcohol use disorder, mild, abuse -in  counseling and doing quite well. Feels guilty if she has even one drink. Has not had a drink in a week. CAGE score is low. Do not feel like she has an issue, but she is aware that she does not want to fall. Continue counseling and she is going to try to stick to one drink limit. Let us know if feels like she is drinking more or using this to cope.   Osteoporosis - Plan: VITAMIN D 25 Hydroxy (Vit-D Deficiency, Fractures) -doing well on prolia. Will need me to order this when ready. She will be due in 3 months. Labs before. Will let me know when she is coming due so we can get this in stock for her. Continue walking, calcium/vitamin D.   Depression, recurrent (HCC) -phq9 score much improved. She just took herself off medication that was prescribed by  Her psychiatrist. She does not really want to start anything and I would feel more comfortable and feel like it's in her best interest to continue to let her psychiatrist manage this. Also discussed she needs to call office and let them know about stopping meds and she should call first before just stopping pills. She has f/u with him in a few weeks.   Anxiety -GAD7 score is 9. Mild anxiety and much improved from her score in May (20).  Discussed options for treatment. Counseling and exercise. Again, discussed medication options and I feel like buspar would be a good choice for her, but would prefer she follow up or have this discussion with her psychiatrist since only mild at this  point. She is in agreement to this plan.   Lorazepam: prescribed by psychiatry. pmp website checked and filled correctly.    Return precautions advised. Orland Mustard, MD

## 2018-08-21 ENCOUNTER — Ambulatory Visit: Payer: Federal, State, Local not specified - PPO | Admitting: Rheumatology

## 2018-08-29 ENCOUNTER — Ambulatory Visit (INDEPENDENT_AMBULATORY_CARE_PROVIDER_SITE_OTHER): Payer: Federal, State, Local not specified - PPO

## 2018-08-29 VITALS — BP 142/74 | HR 68

## 2018-08-29 DIAGNOSIS — I10 Essential (primary) hypertension: Secondary | ICD-10-CM | POA: Diagnosis not present

## 2018-08-29 NOTE — Progress Notes (Signed)
Pt here today for blood pressure check.  She is asymptomatic and feeling fine.  She states that she checks her bp first thing in the morning after first urine void.  Pt says has been keeping a detailed log at home and home records are as follows: 8/24  118/60  8/25  151/78 (am reading)  135/61 (pm reading) 8/26  114/62 8/27  108/61 8/28  130/67 8/29  133/72 8/30  122/61 8/31  148/68 9/1    144/67 9/2    127/66 9/3    121/58 9/4    127/67  Pt has follow up appt with Dr. Artis Flock on 9/12, pt also reports that she was recently put on Subvenite by Dr. Orson Aloe.

## 2018-08-29 NOTE — Progress Notes (Signed)
bp much improved. See her at follow up.

## 2018-09-06 ENCOUNTER — Encounter: Payer: Self-pay | Admitting: Family Medicine

## 2018-09-06 ENCOUNTER — Ambulatory Visit (INDEPENDENT_AMBULATORY_CARE_PROVIDER_SITE_OTHER): Payer: Federal, State, Local not specified - PPO | Admitting: Family Medicine

## 2018-09-06 VITALS — BP 122/70 | HR 61 | Temp 97.6°F | Ht 62.0 in | Wt 128.6 lb

## 2018-09-06 DIAGNOSIS — I10 Essential (primary) hypertension: Secondary | ICD-10-CM | POA: Diagnosis not present

## 2018-09-06 NOTE — Patient Instructions (Signed)
Can look up medication for cholesterol called crestor. It's a statin drug. We would be treating your risk factor of 31.5%, not your actual numbers because your cholesterol is good.   Come back for nurse check of blood pressure in one month.

## 2018-09-06 NOTE — Progress Notes (Signed)
Patient: Christine Scott MRN: [id number] DOB: 08/14/1941 PCP: Orland MustardWolfe, Rilan Eiland, MD     Subjective:  Chief Complaint  Patient presents with  . Follow-up  . Hypertension    HPI: The patient is a 77 y.o. female who presents today for blood pressure follow up. When I saw her about 2 weeks ago she was running low and fatigued. We have weaned her toprol down over the past two weeks to half a pill. Her BP readings have been extremely well controlled and on average less than 120/80. She is still feeling tired and can tell no difference with decreasing beta blocker.   Discussing ascvd risk of 31.5% as well.  Review of Systems  Constitutional: Positive for fatigue. Negative for fever and unexpected weight change.  Eyes: Negative for visual disturbance.  Respiratory: Negative for shortness of breath.   Cardiovascular: Negative for chest pain, palpitations and leg swelling.  Gastrointestinal: Negative for abdominal pain and nausea.  Neurological: Negative for dizziness and headaches.  Psychiatric/Behavioral: Negative for sleep disturbance.    Allergies Patient has No Known Allergies.  Past Medical History Patient  has a past medical history of Abnormal Pap smear of cervix, Depression, Dry eye, Hypertension, Osteoarthritis, Osteoporosis, and Polymyalgia rheumatica (HCC).  Surgical History Patient  has a past surgical history that includes Facial cosmetic surgery; Total vaginal hysterectomy (age 77); and Breast surgery (2016).  Family History Pateint's family history includes Colon cancer in her brother and daughter; Congestive Heart Failure in her mother; Diabetes in her brother, brother, and father; Stroke in her brother and mother.  Social History Patient  reports that she has never smoked. She has never used smokeless tobacco. She reports that she drinks about 1.0 standard drinks of alcohol per week. She reports that she does not use  drugs.    Objective: Vitals:   09/06/18 1326  BP: 122/70  Pulse: 61  Temp: 97.6 F (36.4 C)  TempSrc: Oral  SpO2: 98%  Weight: 128 lb 9.6 oz (58.3 kg)  Height: 5\' 2"  (1.575 m)    Body mass index is 23.52 kg/m.  Physical Exam     Assessment/plan: 1. Essential hypertension Extremely well controlled. Will have her wean completely off beta blocker and continue with home log. Again, let us know if readings >140/80. She will come back in one month for blood pressure check with nurse and then f/u with me for regular appointment in 5-6 months.   Discussed ascvd risk of 31.5%. I do think she could live another 10 years and discussed she could benefit from this. She is not interested in starting medication at this time, but information was given on medication.    Return in about 1 month (around 10/06/2018) for bp check by nurse .   Orland MustardAllison Harve Spradley, MD [facility]   09/06/2018

## 2018-10-09 ENCOUNTER — Ambulatory Visit: Payer: Federal, State, Local not specified - PPO

## 2018-10-11 ENCOUNTER — Telehealth (HOSPITAL_COMMUNITY): Payer: Self-pay | Admitting: Psychology

## 2018-10-12 ENCOUNTER — Ambulatory Visit (INDEPENDENT_AMBULATORY_CARE_PROVIDER_SITE_OTHER): Payer: Federal, State, Local not specified - PPO

## 2018-10-12 VITALS — BP 144/82 | HR 77

## 2018-10-12 DIAGNOSIS — I10 Essential (primary) hypertension: Secondary | ICD-10-CM | POA: Diagnosis not present

## 2018-10-12 NOTE — Progress Notes (Signed)
Called and spoke with patient and scheduled follow up visit w/Dr. Artis Flock for 11/8.

## 2018-10-12 NOTE — Progress Notes (Signed)
That is great. Have her come back in 2-4 weeks so we can make sure she is doing well off beta blocker and look at her home reading.

## 2018-10-12 NOTE — Progress Notes (Signed)
Pt here for blood pressure check per Dr. Artis Flock.  Readings at home have been ranging from 118-128/70-80   Bp today 144/82.  Pt asymptomatic and feels fine.  Will call patient back with plans for next return visit.  Pt verbalized understanding.

## 2018-10-24 ENCOUNTER — Other Ambulatory Visit: Payer: Self-pay | Admitting: Psychiatry

## 2018-10-25 ENCOUNTER — Telehealth: Payer: Self-pay

## 2018-10-25 NOTE — Telephone Encounter (Signed)
Is she talking about her prolia shot? Is she coming due for this? If so we need to order for her and yes, she will need to come in for calcium check, but only if due for prolia.

## 2018-10-25 NOTE — Telephone Encounter (Signed)
No patient is scheduled for follow up with you. 11/8

## 2018-10-25 NOTE — Telephone Encounter (Signed)
Copied from CRM [id number]. Topic: General - Other >> Oct 25, 2018 11:22 AM Ronney Lion A wrote: Reason for CRM: Pt would like to know if she should receive lab work before her appt W/ Dr. Artis Flock.   I advised pt I didn't see any orders or an appt scheduled.

## 2018-10-26 ENCOUNTER — Encounter: Payer: Self-pay | Admitting: Family Medicine

## 2018-10-26 ENCOUNTER — Ambulatory Visit (INDEPENDENT_AMBULATORY_CARE_PROVIDER_SITE_OTHER): Payer: Federal, State, Local not specified - PPO

## 2018-10-26 ENCOUNTER — Ambulatory Visit: Payer: Federal, State, Local not specified - PPO | Admitting: Family Medicine

## 2018-10-26 VITALS — BP 142/78 | HR 69 | Temp 97.8°F | Ht 62.0 in | Wt 126.8 lb

## 2018-10-26 DIAGNOSIS — M25512 Pain in left shoulder: Secondary | ICD-10-CM

## 2018-10-26 MED ORDER — DICLOFENAC SODIUM 1 % TD GEL: 4.0000 g | Freq: Four times a day (QID) | TRANSDERMAL | 1 refills | Status: DC

## 2018-10-26 NOTE — Telephone Encounter (Signed)
Will call in refill too soon today.

## 2018-10-26 NOTE — Progress Notes (Signed)
Patient: Christine Scott MRN: [id number] DOB: 1941-10-16 PCP: Orland Mustard, MD     Subjective:  Chief Complaint  Patient presents with  . L arm injury    occurred on approx 10/18    HPI: The patient is a 77 y.o. female who presents today for left arm injury that occurred about 2 weeks ago. She was getting on a trolley in [place] and when she stepped off of it she put her hand on the seat and somehow twisted her arm. It hurt when she did it, but it seemed to get better. She states she hurt it again when she was getting up on the bed. Pain is in her left shoulder all the way down her humerus. She has a lot of pain with movement across her chest or over her head. Pain is nagging in nature and is constant, but when she does overhead or across the chest movement it is rated as an 8/10 and is sharp/stabbing in nature. She has taken salonpas patches and she doesn't think it helps much. Tylenol only at night. She has had a few tingles in her hands, but not constant and very mild. No weakness or numbness down the arm. Pain when lying on her left shoulder as well.   Review of Systems  Musculoskeletal: Positive for arthralgias. Negative for neck pain.       Left shoulder/upper arm pain    Allergies Patient has No Known Allergies.  Past Medical History Patient  has a past medical history of Abnormal Pap smear of cervix, Depression, Dry eye, Hypertension, Osteoarthritis, Osteoporosis, and Polymyalgia rheumatica (HCC).  Surgical History Patient  has a past surgical history that includes Facial cosmetic surgery; Total vaginal hysterectomy (age 31); and Breast surgery (2016).  Family History Pateint's family history includes Colon cancer in her brother and daughter; Congestive Heart Failure in her mother; Diabetes in her brother, brother, and father; Stroke in her brother and mother.  Social History Patient  reports that she has never smoked. She has never used smokeless tobacco. She reports that  she drinks about 1.0 standard drinks of alcohol per week. She reports that she does not use drugs.    Objective: Vitals:   10/26/18 1159  BP: (!) 142/78  Pulse: 69  Temp: 97.8 F (36.6 C)  TempSrc: Oral  SpO2: 96%  Weight: 126 lb 12.8 oz (57.5 kg)  Height: 5\' 2"  (1.575 m)    Body mass index is 23.19 kg/m.  Physical Exam  Constitutional: She appears well-developed and well-nourished.  Musculoskeletal:  Left shoulder/arm exam -TTP over glenohumeral joint, more posterior and superior aspect. +passive arc test, negative gerbers, negative o brians, +hawkin's kennedy   Vitals reviewed.  Left shoulder: glenohumeral joint OA. Tiny lateral acromial spur which may predispose patient to RC tears with shoulder abduction  Left humerus: normal except for above     Assessment/plan: 1. Acute pain of left shoulder SIS vs. Rotator cuff injury. Supportive therapy x 2 weeks then if not getting better will MRI her. She doesn't want surgery so we can also discuss sports med/ PT as well. May benefit from Steroid injection with known OA.  Sending in voltaren gel for her and given her some home exercises as well. She sees me in one week so we can f/u on this.  - DG Humerus Left; Future - DG Shoulder Left; Future    Return if symptoms worsen or fail to improve.   Orland Mustard, MD [facility]  10/26/2018  

## 2018-11-02 ENCOUNTER — Encounter: Payer: Self-pay | Admitting: Family Medicine

## 2018-11-02 ENCOUNTER — Ambulatory Visit (INDEPENDENT_AMBULATORY_CARE_PROVIDER_SITE_OTHER): Payer: Federal, State, Local not specified - PPO | Admitting: Family Medicine

## 2018-11-02 VITALS — BP 150/72 | HR 76 | Temp 98.1°F | Ht 62.0 in | Wt 127.2 lb

## 2018-11-02 DIAGNOSIS — M25512 Pain in left shoulder: Secondary | ICD-10-CM

## 2018-11-02 DIAGNOSIS — I10 Essential (primary) hypertension: Secondary | ICD-10-CM

## 2018-11-02 NOTE — Progress Notes (Signed)
Patient: Christine Scott MRN: [id number] DOB: 07/02/1941 PCP: Orland Mustard, MD     Subjective:  Chief Complaint  Patient presents with  . Hypertension    HPI: The patient is a 77 y.o. female who presents today for blood pressure follow up.   Hypertension: Here for follow up of hypertension.  Currently on amlodipine 10mg  daily. I have weaned her off her beta blocker due to fatigue.  Following up for pressures today.  Home readings range from 116-150 systolic/60-80 diastolic. Takes medication as prescribed and denies any side effects. Exercise includes walking. Weight has been stable. Denies any chest pain, headaches, shortness of breath, vision changes, swelling in lower extremities. She HAS NOT taken her medication today.   Left shoulder pain: I saw her Monday and discussed impingement vs. Rotator cuff tear. Gave her handout with exercises and voltaren gel. She can tell a very mild improvement, but is still having quite significant pain.   -due for prolia in January.    Review of Systems  Constitutional: Negative for chills and fever.  Respiratory: Negative for choking and shortness of breath.   Cardiovascular: Negative for chest pain, palpitations and leg swelling.  Gastrointestinal: Negative for abdominal pain, constipation, diarrhea, nausea and vomiting.  Musculoskeletal: Positive for arthralgias (left shoulder pain ).    Allergies  Patient has No Known Allergies.  Past Medical History Patient  has a past medical history of Abnormal Pap smear of cervix, Depression, Dry eye, Hypertension, Osteoarthritis, Osteoporosis, and Polymyalgia rheumatica (HCC).  Surgical History Patient  has a past surgical history that includes Facial cosmetic surgery; Total vaginal hysterectomy (age 22); and Breast surgery (2016).  Family History Pateint's family history includes Colon cancer in her brother and daughter; Congestive Heart Failure in her mother; Diabetes in her brother, brother, and  father; Stroke in her brother and mother.  Social History Patient  reports that she has never smoked. She has never used smokeless tobacco. She reports that she drinks about 1.0 standard drinks of alcohol per week. She reports that she does not use drugs.    Objective: Vitals:   11/02/18 0819 11/02/18 0833  BP: 128/76 (!) 150/72  Pulse: 76   Temp: 98.1 F (36.7 C)   TempSrc: Oral   SpO2: 97%   Weight: 127 lb 3.2 oz (57.7 kg)   Height: 5\' 2"  (1.575 m)     Body mass index is 23.27 kg/m.  Physical Exam  Constitutional: She is oriented to person, place, and time. She appears well-developed and well-nourished.  Neck: Normal range of motion. Neck supple.  Cardiovascular: Normal rate, regular rhythm and normal heart sounds.  Pulmonary/Chest: Effort normal and breath sounds normal.  Abdominal: Soft. Bowel sounds are normal.  Neurological: She is alert and oriented to person, place, and time.  Vitals reviewed.      Assessment/plan: 1. Essential hypertension Home readings to goal with a few outliers. Above goal today, but she did not take her medication. I think she is to goal off beta blockers. Continue with norvasc only. Sees me in one month for routine 6 month f/u with labs. Bring in log again and make sure she takes medication on next appointment.   2. Acute pain of left shoulder Does not want to do injection. Will get MRI to see if rotator cuff tear. Continue with supportive therapy. Likely PT after we get MRI back as she wants to hold off on surgery.    - MR Shoulder Left Wo Contrast; Future   Return  if symptoms worsen or fail to improve.     Orland Mustard, MD [facility] [facility]  11/02/2018

## 2018-11-11 ENCOUNTER — Other Ambulatory Visit: Payer: Self-pay

## 2018-11-14 ENCOUNTER — Ambulatory Visit
Admission: RE | Admit: 2018-11-14 | Discharge: 2018-11-14 | Disposition: A | Discharge status: Home / Self Care | Payer: Federal, State, Local not specified - PPO | Source: Ambulatory Visit | Attending: Family Medicine | Admitting: Family Medicine

## 2018-11-14 DIAGNOSIS — M25512 Pain in left shoulder: Secondary | ICD-10-CM

## 2018-11-15 ENCOUNTER — Other Ambulatory Visit: Payer: Self-pay | Admitting: Family Medicine

## 2018-11-15 DIAGNOSIS — M75122 Complete rotator cuff tear or rupture of left shoulder, not specified as traumatic: Secondary | ICD-10-CM

## 2018-11-16 ENCOUNTER — Telehealth: Payer: Self-pay | Admitting: Family Medicine

## 2018-11-16 NOTE — Telephone Encounter (Signed)
See note  Copied from CRM [id number]. Topic: Referral - Medical Records >> Nov 16, 2018  2:48 PM Crist InfanteHarrald, Kathy J wrote: Reason for CRM: susan with Dr Thurston HoleWainer office called and they need the MRI pt had yesterday faxed to them at Fax: [phone] Pt has appt next week

## 2018-11-16 NOTE — Telephone Encounter (Signed)
MRI report from 11/20 faxed to Dr. Sherene SiresWainer's office per request of Darl PikesSusan.

## 2018-11-27 ENCOUNTER — Ambulatory Visit (INDEPENDENT_AMBULATORY_CARE_PROVIDER_SITE_OTHER): Payer: Federal, State, Local not specified - PPO

## 2018-11-27 ENCOUNTER — Encounter: Payer: Self-pay | Admitting: Family Medicine

## 2018-11-27 DIAGNOSIS — Z23 Encounter for immunization: Secondary | ICD-10-CM | POA: Diagnosis not present

## 2018-12-06 ENCOUNTER — Encounter: Payer: Self-pay | Admitting: Family Medicine

## 2018-12-06 DIAGNOSIS — M75102 Unspecified rotator cuff tear or rupture of left shoulder, not specified as traumatic: Secondary | ICD-10-CM

## 2018-12-06 DIAGNOSIS — M12812 Other specific arthropathies, not elsewhere classified, left shoulder: Secondary | ICD-10-CM | POA: Insufficient documentation

## 2018-12-06 HISTORY — DX: Other specific arthropathies, not elsewhere classified, left shoulder: M75.102

## 2018-12-07 ENCOUNTER — Encounter: Payer: Self-pay | Admitting: Emergency Medicine

## 2018-12-07 DIAGNOSIS — F3181 Bipolar II disorder: Secondary | ICD-10-CM | POA: Insufficient documentation

## 2018-12-17 ENCOUNTER — Ambulatory Visit: Payer: Federal, State, Local not specified - PPO | Admitting: Psychiatry

## 2019-02-11 ENCOUNTER — Ambulatory Visit (INDEPENDENT_AMBULATORY_CARE_PROVIDER_SITE_OTHER): Payer: Federal, State, Local not specified - PPO

## 2019-02-11 ENCOUNTER — Ambulatory Visit: Payer: Federal, State, Local not specified - PPO | Admitting: Physician Assistant

## 2019-02-11 ENCOUNTER — Encounter: Payer: Self-pay | Admitting: Physician Assistant

## 2019-02-11 VITALS — BP 140/78 | HR 70 | Ht 62.0 in | Wt 127.0 lb

## 2019-02-11 DIAGNOSIS — M25552 Pain in left hip: Secondary | ICD-10-CM

## 2019-02-11 MED ORDER — MELOXICAM 15 MG PO TABS: 15.0000 mg | ORAL_TABLET | Freq: Every day | ORAL | 0 refills | Status: DC

## 2019-02-11 MED ORDER — MELOXICAM 7.5 MG PO TABS: 7.5000 mg | ORAL_TABLET | Freq: Every day | ORAL | 0 refills | Status: DC

## 2019-02-11 NOTE — Progress Notes (Signed)
Christine Scott is a 78 y.o. female here for a new problem.  I acted as a Neurosurgeon for [employer], PA-C Corky Mull, LPN  History of Present Illness:   Chief Complaint  Patient presents with  . Lt hip pain    Starting yesterday    Hip Pain   The incident occurred 6 to 12 hours ago. The incident occurred at home. There was no injury mechanism. The pain is present in the left hip. Quality: Sharp pains with standing and walking. The pain is at a severity of 9/10. The pain is severe. The pain has been intermittent since onset. Pertinent negatives include no muscle weakness, numbness or tingling. She reports no foreign bodies present. The symptoms are aggravated by movement. She has tried acetaminophen for the symptoms. The treatment provided no relief.   Does have significant PMHx of osteoporosis. Currently on Prolia, last injection at end of Aug 2019.  Denies prior hip fx.  Past Medical History:  Diagnosis Date  . Abnormal Pap smear of cervix    --hx abnormal paps in her 30s and per patient this was reason for her Hysterectomy  . Depression   . Dry eye   . Hypertension   . Osteoarthritis    hands  . Osteoporosis   . Polymyalgia rheumatica (HCC)      Social History   Socioeconomic History  . Marital status: Widowed    Spouse name: Not on file  . Number of children: Not on file  . Years of education: Not on file  . Highest education level: Not on file  Occupational History  . Not on file  Social Needs  . Financial resource strain: Not on file  . Food insecurity:    Worry: Not on file    Inability: Not on file  . Transportation needs:    Medical: Not on file    Non-medical: Not on file  Tobacco Use  . Smoking status: Never Smoker  . Smokeless tobacco: Never Used  Substance and Sexual Activity  . Alcohol use: Yes    Alcohol/week: 1.0 standard drinks    Types: 1 Standard drinks or equivalent per week    Comment: occasional--2 glasses of wine/month  . Drug use:  Never  . Sexual activity: Never    Partners: Male    Birth control/protection: Surgical    Comment: TVH  Lifestyle  . Physical activity:    Days per week: Not on file    Minutes per session: Not on file  . Stress: Not on file  Relationships  . Social connections:    Talks on phone: Not on file    Gets together: Not on file    Attends religious service: Not on file    Active member of club or organization: Not on file    Attends meetings of clubs or organizations: Not on file    Relationship status: Not on file  . Intimate partner violence:    Fear of current or ex partner: Not on file    Emotionally abused: Not on file    Physically abused: Not on file    Forced sexual activity: Not on file  Other Topics Concern  . Not on file  Social History Narrative  . Not on file    Past Surgical History:  Procedure Laterality Date  . BREAST SURGERY  2016   benign mass removed in [place], [place].  Marland Kitchen FACIAL COSMETIC SURGERY    . TOTAL VAGINAL HYSTERECTOMY  age 30   --  due to abnormal pap smears per patient    Family History  Problem Relation Age of Onset  . Congestive Heart Failure Mother   . Stroke Mother   . Diabetes Father   . Colon cancer Brother   . Diabetes Brother   . Diabetes Brother   . Stroke Brother   . Colon cancer Daughter     No Known Allergies  Current Medications:   Current Outpatient Medications:  .  Acetaminophen (TYLENOL PO), Take by mouth at bedtime., Disp: , Rfl:  .  amLODipine (NORVASC) 10 MG tablet, Take 1 tablet by mouth daily., Disp: , Rfl:  .  B Complex-Biotin-FA (B-COMPLEX PO), Take 1 tablet by mouth daily. , Disp: , Rfl:  .  Calcium Carbonate-Vitamin D3 (CALCIUM 600-D) 600-400 MG-UNIT TABS, Take 1 tablet by mouth 2 (two) times daily., Disp: , Rfl:  .  Cholecalciferol (VITAMIN D3 PO), Take 1,200 Units by mouth daily., Disp: , Rfl:  .  conjugated estrogens (PREMARIN) vaginal cream, Place 1/2 gram per vagina at bedtime twice weekly., Disp: 30 g,  Rfl: 1 .  denosumab (PROLIA) 60 MG/ML SOLN injection, Inject 60 mg into the skin every 6 (six) months. Administer in upper arm, thigh, or abdomen, Disp: , Rfl:  .  lamoTRIgine (LAMICTAL) 150 MG tablet, Take 150 mg by mouth daily., Disp: , Rfl:  .  LORazepam (ATIVAN) 1 MG tablet, TAKE 1 TABLET AT BEDTIME, Disp: 90 tablet, Rfl: 0 .  Multiple Vitamins-Minerals (MULTIVITAMIN PO), Take 1 tablet by mouth daily. , Disp: , Rfl:  .  Omega-3 Fatty Acids (FISH OIL PO), Take 1 capsule by mouth daily. , Disp: , Rfl:  .  Polyethyl Glycol-Propyl Glycol (SYSTANE ULTRA OP), Apply to eye., Disp: , Rfl:  .  diclofenac sodium (VOLTAREN) 1 % GEL, Apply 4 g topically 4 (four) times daily. (Patient not taking: Reported on 02/11/2019), Disp: 100 g, Rfl: 1 .  Ferrous Sulfate (IRON SLOW RELEASE) 140 (45 Fe) MG TBCR, Take by mouth., Disp: , Rfl:  .  meloxicam (MOBIC) 7.5 MG tablet, Take 1 tablet (7.5 mg total) by mouth daily., Disp: 30 tablet, Rfl: 0 .  pilocarpine (SALAGEN) 5 MG tablet, Take by mouth 2 (two) times daily., Disp: , Rfl:  .  White Petrolatum-Mineral Oil (SYSTANE NIGHTTIME) OINT, Apply to eye., Disp: , Rfl:    Review of Systems:   Review of Systems  Constitutional: Negative for chills, fever, malaise/fatigue and weight loss.  Musculoskeletal: Positive for joint pain (L hip). Negative for back pain, falls and myalgias.  Neurological: Negative for dizziness, tingling, tremors, focal weakness, weakness and numbness.    Vitals:   Vitals:   02/11/19 1512  BP: 140/78  Pulse: 70  SpO2: 97%  Weight: 127 lb (57.6 kg)  Height: 5\' 2"  (1.575 m)     Body mass index is 23.23 kg/m.  Physical Exam:   Physical Exam Constitutional:      Appearance: She is well-developed.  HENT:     Head: Normocephalic and atraumatic.  Eyes:     Conjunctiva/sclera: Conjunctivae normal.  Neck:     Musculoskeletal: Normal range of motion and neck supple.  Pulmonary:     Effort: Pulmonary effort is normal.   Musculoskeletal:     Comments: TTP of L greater trochanter area. Slight pain elicited with external rotation of L hip. Slight antalgic gait. No ecchymosis or erythema of L hip noted.  Skin:    General: Skin is warm and dry.  Neurological:  Mental Status: She is alert and oriented to person, place, and time.     Comments: Normal sensation to b/l LE  Psychiatric:        Behavior: Behavior normal.        Thought Content: Thought content normal.        Judgment: Judgment normal.       Assessment and Plan:   [name] was seen today for lt hip pain.  Diagnoses and all orders for this visit:  Left hip pain Xray performed due to pain and hx of osteoporosis. Official read pending -- I did briefly review with in office sports medicine provider, Dr. Gaspar BiddingMichael Rigby. Suspect possible greater trochanteric bursitis. Will trial ice compresses, meloxicam 7.5 mg and exercises. If lack of improvement or worsening of symptoms, follow-up with Dr. Berline Choughigby in 1 week for further work-up (possible U/S and/or injection.) Patient verbalized understanding and was agreeable to plan. -     DG HIP UNILAT W OR W/O PELVIS 2-3 VIEWS LEFT; Future  Other orders -     meloxicam (MOBIC) 7.5 MG tablet; Take 1 tablet (7.5 mg total) by mouth daily.   CMA or LPN served as scribe during this visit. History, Physical, and Plan performed by medical provider. The above documentation has been reviewed and is accurate and complete.   Jarold MottoSamantha Dadrian Ballantine, PA-C

## 2019-02-11 NOTE — Patient Instructions (Addendum)
Start meloxicam 7.5 mg daily x 1 week.  If symptoms persist, please see Dr. Berline Chough here in our office for further evaluation.

## 2019-02-11 NOTE — Progress Notes (Signed)
Office Visit Note  Patient: Christine Scott             Date of Birth: Mar 18, 1941           MRN: [id number]             PCP: Orland Mustard, MD Referring: Sigmund Hazel, MD Visit Date: 02/25/2019 Occupation: @GUAROCC @  Subjective:  Left shoulder pain.   History of Present Illness: Christine Scott is a 78 y.o. female history of polymyalgia rheumatica, Sjogren's and osteoarthritis.  She states she has mild sicca symptoms.  She has been also experiencing some discomfort in her right hip which is going down her right lower extremity.  She has recently been experiencing increased pain in her left shoulder.  She states she was referred to an orthopedic surgeon who advised total shoulder replacement but she is not prepared for it at this point.  She continues to have discomfort in her left shoulder and has nocturnal pain.  Activities of Daily Living:  Patient reports morning stiffness for 5 minutes.   Patient Denies nocturnal pain.  Difficulty dressing/grooming: Denies Difficulty climbing stairs: Denies Difficulty getting out of chair: Reports Difficulty using hands for taps, buttons, cutlery, and/or writing: Denies  Review of Systems  Constitutional: Positive for fatigue. Negative for night sweats, weight gain and weight loss.  HENT: Positive for mouth dryness. Negative for mouth sores, trouble swallowing, trouble swallowing and nose dryness.   Eyes: Positive for dryness. Negative for pain, redness and visual disturbance.  Respiratory: Negative for cough, shortness of breath and difficulty breathing.   Cardiovascular: Negative for chest pain, palpitations, hypertension, irregular heartbeat and swelling in legs/feet.  Gastrointestinal: Positive for heartburn. Negative for blood in stool, constipation and diarrhea.  Endocrine: Negative for increased urination.  Genitourinary: Negative for vaginal dryness.  Musculoskeletal: Positive for arthralgias, joint pain and morning stiffness. Negative for  joint swelling, myalgias, muscle weakness, muscle tenderness and myalgias.  Skin: Negative for color change, rash, hair loss, skin tightness, ulcers and sensitivity to sunlight.  Allergic/Immunologic: Negative for susceptible to infections.  Neurological: Negative for dizziness, memory loss, night sweats and weakness.  Hematological: Negative for swollen glands.  Psychiatric/Behavioral: Positive for depressed mood and sleep disturbance. The patient is not nervous/anxious.     PMFS History:  Patient Active Problem List   Diagnosis Date Noted  . DDD (degenerative disc disease), lumbar 02/25/2019  . Bipolar II disorder (HCC) 12/07/2018  . 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 History of depression 08/12/2017  . Essential hypertension 08/12/2017  . Lumbar radiculopathy 06/15/2017  . Sjogren's syndrome 12/25/2016  . 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 Primary osteoarthritis of both feet 12/25/2016  . Osteoporosis 12/25/2016    Past Medical History:  Diagnosis Date  . Abnormal Pap smear of cervix    --hx abnormal paps in her 30s and per patient this was reason for her Hysterectomy  . Depression   . Dry eye   . Hypertension   . Osteoarthritis    hands  . Osteoporosis   . Polymyalgia rheumatica (HCC)     Family History  Problem Relation Age of Onset  . Congestive Heart Failure Mother   . Stroke Mother   . Diabetes Father   . Colon cancer Brother   . Diabetes Brother   . Diabetes Brother   . Stroke Brother   . Colon cancer Daughter    Past Surgical History:  Procedure Laterality Date  .  BREAST SURGERY  2016   benign mass removed in [place], [place].  Marland Kitchen FACIAL COSMETIC SURGERY    . TOTAL VAGINAL HYSTERECTOMY  age 57   --due to abnormal pap smears per patient   Social History   Social History Narrative  . Not on file   Immunization History  Administered Date(s) Administered  . Influenza, High Dose  Seasonal PF 10/21/2017, 11/27/2018  . Influenza-Unspecified 10/13/2016  . Pneumococcal Conjugate-13 08/17/2018  . Pneumococcal Polysaccharide-23 01/19/2016  . Tdap 12/26/2009     Objective: Vital Signs: BP 109/62 (BP Location: Left Arm, Patient Position: Sitting, Cuff Size: Normal)   Pulse 78   Resp 12   Ht 5\' 2"  (1.575 m)   Wt 129 lb (58.5 kg)   LMP  (LMP Unknown)   BMI 23.59 kg/m    Physical Exam Vitals signs and nursing note reviewed.  Constitutional:      Appearance: She is well-developed.  HENT:     Head: Normocephalic and atraumatic.  Eyes:     Conjunctiva/sclera: Conjunctivae normal.  Neck:     Musculoskeletal: Normal range of motion.  Cardiovascular:     Rate and Rhythm: Normal rate and regular rhythm.     Heart sounds: Normal heart sounds.  Pulmonary:     Effort: Pulmonary effort is normal.     Breath sounds: Normal breath sounds.  Abdominal:     General: Bowel sounds are normal.     Palpations: Abdomen is soft.  Lymphadenopathy:     Cervical: No cervical adenopathy.  Skin:    General: Skin is warm and dry.     Capillary Refill: Capillary refill takes less than 2 seconds.  Neurological:     Mental Status: She is alert and oriented to person, place, and time.  Psychiatric:        Behavior: Behavior normal.      Musculoskeletal Exam: C-spine thoracic and lumbar spine good range of motion.  She has thoracolumbar scoliosis.  She has painful range of motion of her left shoulder.  Right shoulder joint with good range of motion.  Elbow joints and wrist joints with good range of motion.  She has DIP and PIP thickening consistent with osteoarthritis.  Hip joints, knee joints are in good range of motion.  No muscular weakness was noted.  CDAI Exam: CDAI Score: Not documented Patient Global Assessment: Not documented; Provider Global Assessment: Not documented Swollen: Not documented; Tender: Not documented Joint Exam   Not documented   There is currently no  information documented on the homunculus. Go to the Rheumatology activity and complete the homunculus joint exam.  Investigation: No additional findings.  Imaging: Dg Hip Unilat W Or W/o Pelvis 2-3 Views Left  Result Date: 02/12/2019 CLINICAL DATA:  Left hip pain, no reported injury EXAM: DG HIP (WITH OR WITHOUT PELVIS) 2-3V LEFT COMPARISON:  12/27/2016 pelvic radiographs FINDINGS: No pelvic fracture or diastasis. No left hip fracture or dislocation. No significant left hip arthropathy. Degenerative changes in the visualized lower lumbar spine. IMPRESSION: No acute osseous abnormality. No significant left hip arthropathy. Degenerative changes in the visualized lower lumbar spine. Electronically Signed   By: Delbert Phenix M.D.   On: 02/12/2019 08:46    Recent Labs: Lab Results  Component Value Date   WBC 6.6 08/17/2018   HGB 12.5 08/17/2018   PLT 217.0 08/17/2018   NA 133 (L) 08/17/2018   K 4.3 08/17/2018   CL 96 08/17/2018   CO2 30 08/17/2018   GLUCOSE 100 (H)  08/17/2018   BUN 14 08/17/2018   CREATININE 0.79 08/17/2018   BILITOT 0.5 08/17/2018   ALKPHOS 55 08/17/2018   AST 20 08/17/2018   ALT 11 08/17/2018   PROT 7.6 08/17/2018   ALBUMIN 4.5 08/17/2018   CALCIUM 10.6 (H) 08/17/2018   GFRAA 88 02/15/2018    Speciality Comments: No specialty comments available.  Procedures:  No procedures performed Allergies: Patient has no known allergies.   Assessment / Plan:     Visit Diagnoses: Polymyalgia rheumatica (HCC) -complains of increased muscle pain and tenderness.  I do not appreciate any weakness on examination today.  I will obtain sedimentation rate.  Plan: Sedimentation rate  Sjogren's syndrome-her sicca symptoms are manageable with over-the-counter meds.  Primary osteoarthritis of both hands-she has DIP and PIP thickening in her hands consistent with osteoarthritis.  Joint protection was discussed.  Primary osteoarthritis of both feet-the discomfort in her feet has  improved  with proper fitting shoes.  DDD (degenerative disc disease), lumbar-she has chronic mild lower back discomfort.  Pain in left shoulder-patient has been seen by orthopedics.  She was advised total shoulder replacement.  At this point she is not interested in proceeding with the surgery.  Osteoporosis - She has a history of osteoporosis that has improved on Prolia.  She has been on prolia for 3-4 years.  Her osteoporosis has been managed by her PCP.  I discussed switching from Prolia to Reclast but when she will be able to go on a drug holiday as her bone density is almost in osteopenia range. - Plan: VITAMIN D 25 Hydroxy (Vit-D Deficiency, Fractures)  History of hypertension-blood pressure is well controlled.  History of depression  Medication monitoring encounter - Plan: CBC with Differential/Platelet, COMPLETE METABOLIC PANEL WITH GFR   Orders: Orders Placed This Encounter  Procedures  . CBC with Differential/Platelet  . COMPLETE METABOLIC PANEL WITH GFR  . VITAMIN D 25 Hydroxy (Vit-D Deficiency, Fractures)  . Sedimentation rate   No orders of the defined types were placed in this encounter.   Face-to-face time spent with patient was 30 minutes. Greater than 50% of time was spent in counseling and coordination of care.  Follow-Up Instructions: Return in about 6 months (around 08/28/2019) for Polymyalgia Rheumatica, Sjogren's syndrome, Osteoarthritis.   Pollyann SavoyShaili Janea Schwenn, MD  Note - This record has been created using Animal nutritionistDragon software.  Chart creation errors have been sought, but may not always  have been located. Such creation errors do not reflect on  the standard of medical care.

## 2019-02-14 ENCOUNTER — Ambulatory Visit: Payer: Federal, State, Local not specified - PPO | Admitting: Rheumatology

## 2019-02-25 ENCOUNTER — Encounter: Payer: Self-pay | Admitting: Rheumatology

## 2019-02-25 ENCOUNTER — Other Ambulatory Visit: Payer: Self-pay | Admitting: Psychiatry

## 2019-02-25 ENCOUNTER — Ambulatory Visit: Payer: Federal, State, Local not specified - PPO | Admitting: Rheumatology

## 2019-02-25 VITALS — BP 109/62 | HR 78 | Resp 12 | Ht 62.0 in | Wt 129.0 lb

## 2019-02-25 DIAGNOSIS — M5136 Other intervertebral disc degeneration, lumbar region: Secondary | ICD-10-CM | POA: Insufficient documentation

## 2019-02-25 DIAGNOSIS — M19071 Primary osteoarthritis, right ankle and foot: Secondary | ICD-10-CM | POA: Diagnosis not present

## 2019-02-25 DIAGNOSIS — M353 Polymyalgia rheumatica: Secondary | ICD-10-CM | POA: Diagnosis not present

## 2019-02-25 DIAGNOSIS — M19042 Primary osteoarthritis, left hand: Secondary | ICD-10-CM

## 2019-02-25 DIAGNOSIS — Z8659 Personal history of other mental and behavioral disorders: Secondary | ICD-10-CM

## 2019-02-25 DIAGNOSIS — M19041 Primary osteoarthritis, right hand: Secondary | ICD-10-CM | POA: Diagnosis not present

## 2019-02-25 DIAGNOSIS — M3501 Sicca syndrome with keratoconjunctivitis: Secondary | ICD-10-CM | POA: Diagnosis not present

## 2019-02-25 DIAGNOSIS — M51369 Other intervertebral disc degeneration, lumbar region without mention of lumbar back pain or lower extremity pain: Secondary | ICD-10-CM

## 2019-02-25 DIAGNOSIS — M19072 Primary osteoarthritis, left ankle and foot: Secondary | ICD-10-CM

## 2019-02-25 DIAGNOSIS — Z5181 Encounter for therapeutic drug level monitoring: Secondary | ICD-10-CM

## 2019-02-25 DIAGNOSIS — Z8679 Personal history of other diseases of the circulatory system: Secondary | ICD-10-CM

## 2019-02-25 DIAGNOSIS — M810 Age-related osteoporosis without current pathological fracture: Secondary | ICD-10-CM

## 2019-02-25 NOTE — Telephone Encounter (Signed)
NEED TO REVIEW PAPER CHART NOT SEEN IN Epic  

## 2019-02-25 NOTE — Progress Notes (Signed)
Pharmacy Note  Patient had questions about her Prolia injection.  It is currently being prescribed by her PCP.  She wanted to know whether or not she should get her injection here or by her PCP.  Instructed patient that we are happy to take over her care for osteoporosis but she should follow-up with her PCP first.  She was unsure of her last Prolia injection.  Looking through Dr. Terrance Mass notes her last injection was in August 2019.  Her next injection was due in January.  Encouraged patient to follow-up with PCP.  She also had questions about calcium and vitamin D supplementation.  Reviewed recommended intake of calcium is 1200 mg daily and that can be obtained through supplement or diet by eating 3 servings of dairy a day.  Patient currently eats 1 serving of dairy a day.  She is taking calcium/vitamin D combo product of 600 mg / 400 units.  She also takes a multivitamin with unknown amounts of calcium and vitamin D and additional vitamin D supplement of 1200 units.  Recommend decreasing calcium/vitamin D combination product to once daily do not exceed 1400 mg of calcium through diet and supplements.  Also encouraged patient to see how much calcium and vitamin D are in her multivitamin.  Patient verbalized understanding.  She was given handout with recommended daily amounts of calcium and vitamin D along with a list of calcium rich foods.  Instructed patient to call our office after following up with primary care provider if she would like to have [place] manage her osteoporosis.  Patient verbalized understanding.  All questions encouraged and answered.  Instructed patient to call with any further questions or concerns.  Christine Scott, PharmD, [facility] Rheumatology Clinical Pharmacist  02/25/2019 12:25 PM

## 2019-02-26 ENCOUNTER — Telehealth: Payer: Self-pay

## 2019-02-26 ENCOUNTER — Telehealth: Payer: Self-pay | Admitting: Family Medicine

## 2019-02-26 LAB — CBC WITH DIFFERENTIAL/PLATELET
ABSOLUTE MONOCYTES: 530 {cells}/uL (ref 200–950)
Basophils Absolute: 48 cells/uL (ref 0–200)
Basophils Relative: 0.7 %
Eosinophils Absolute: 61 cells/uL (ref 15–500)
Eosinophils Relative: 0.9 %
HCT: 33.4 % — ABNORMAL LOW (ref 35.0–45.0)
Hemoglobin: 11.1 g/dL — ABNORMAL LOW (ref 11.7–15.5)
Lymphs Abs: 959 cells/uL (ref 850–3900)
MCH: 30.2 pg (ref 27.0–33.0)
MCHC: 33.2 g/dL (ref 32.0–36.0)
MCV: 90.8 fL (ref 80.0–100.0)
MPV: 11.8 fL (ref 7.5–12.5)
Monocytes Relative: 7.8 %
Neutro Abs: 5202 cells/uL (ref 1500–7800)
Neutrophils Relative %: 76.5 %
Platelets: 220 10*3/uL (ref 140–400)
RBC: 3.68 10*6/uL — ABNORMAL LOW (ref 3.80–5.10)
RDW: 12.7 % (ref 11.0–15.0)
Total Lymphocyte: 14.1 %
WBC: 6.8 10*3/uL (ref 3.8–10.8)

## 2019-02-26 LAB — COMPLETE METABOLIC PANEL WITH GFR
AG RATIO: 1.6 (calc) (ref 1.0–2.5)
ALT: 11 U/L (ref 6–29)
AST: 22 U/L (ref 10–35)
Albumin: 4.3 g/dL (ref 3.6–5.1)
Alkaline phosphatase (APISO): 48 U/L (ref 37–153)
BUN/Creatinine Ratio: 20 (calc) (ref 6–22)
BUN: 20 mg/dL (ref 7–25)
CALCIUM: 10.9 mg/dL — AB (ref 8.6–10.4)
CO2: 29 mmol/L (ref 20–32)
Chloride: 99 mmol/L (ref 98–110)
Creat: 0.99 mg/dL — ABNORMAL HIGH (ref 0.60–0.93)
GFR, Est African American: 63 mL/min/{1.73_m2} (ref >=60)
GFR, Est Non African American: 55 mL/min/{1.73_m2} — ABNORMAL LOW (ref >=60)
Globulin: 2.7 g/dL (calc) (ref 1.9–3.7)
Glucose, Bld: 84 mg/dL (ref 65–99)
Potassium: 4.8 mmol/L (ref 3.5–5.3)
Sodium: 136 mmol/L (ref 135–146)
Total Bilirubin: 0.4 mg/dL (ref 0.2–1.2)
Total Protein: 7 g/dL (ref 6.1–8.1)

## 2019-02-26 LAB — SEDIMENTATION RATE: Sed Rate: 14 mm/h (ref 0–30)

## 2019-02-26 LAB — VITAMIN D 25 HYDROXY (VIT D DEFICIENCY, FRACTURES): Vit D, 25-Hydroxy: 63 ng/mL (ref 30–100)

## 2019-02-26 NOTE — Telephone Encounter (Signed)
Called and spoke to patient and advised that Amber would be contacting her in regards to arranging for her prolia.  Pt's chart forwarded to [facility] and pt verbalized understanding of the above

## 2019-02-26 NOTE — Progress Notes (Signed)
GFR is low. She should dc all NSAIDS including Mobic.

## 2019-02-26 NOTE — Telephone Encounter (Signed)
Pt needs prolia set up.  Amber to make arrangements for this.  Dx: Osteoporosis

## 2019-02-26 NOTE — Telephone Encounter (Signed)
Called and spoke with patient and advised that Amber would be contacting her in regards to setting up her prolia.  Pt verbalized understanding.

## 2019-02-26 NOTE — Telephone Encounter (Signed)
Copied from CRM [id number]. Topic: General - Inquiry >> Feb 26, 2019 11:41 AM Lorrine Kin, NT wrote: Reason for CRM: patient calling to see how to go about getting her prolia injection. Please advise.

## 2019-02-27 NOTE — Telephone Encounter (Signed)
Insurance verification is in process.

## 2019-03-01 NOTE — Telephone Encounter (Signed)
Verification is complete.  PA is now in progress.  Waiting for insurance decision.

## 2019-03-04 NOTE — Telephone Encounter (Signed)
Pt stated she still has not heard from anyone regarding the prolia injection. Pt stated if she is not contacted by noon tomorrow 03/05/19 she requests that Dr. Artis Flock calls her back. Pt stated she has been waiting but no one has contacted her.

## 2019-03-05 ENCOUNTER — Other Ambulatory Visit: Payer: Self-pay | Admitting: Physician Assistant

## 2019-03-05 NOTE — Telephone Encounter (Signed)
Please see other telephone encounter.  The prior authorization for this is in progress.

## 2019-03-06 NOTE — Telephone Encounter (Signed)
PA for Prolia has been approved.  LM for patient to return call to schedule nurse visit.  When patient calls back, please schedule nurse visit for Prolia injection.  Prolia has been set aside for patient in Pod D fridge.

## 2019-03-07 NOTE — Telephone Encounter (Signed)
See note

## 2019-03-07 NOTE — Telephone Encounter (Signed)
Amber, can you pls call patient to clarify?  Thanks

## 2019-03-07 NOTE — Telephone Encounter (Signed)
Pt called back to speak with Amber. States she is confused about the Prolia shot because she thought she was supposed to have a calcium count before the injection and wants to know how to proceed.

## 2019-03-11 ENCOUNTER — Other Ambulatory Visit: Payer: Self-pay | Admitting: Psychiatry

## 2019-03-11 NOTE — Telephone Encounter (Signed)
No calcium level needed.  Prolia has already been approved by insurance.  Patient is scheduled for nurse visit for injection on Tuesday, 3/17.

## 2019-03-12 ENCOUNTER — Ambulatory Visit (INDEPENDENT_AMBULATORY_CARE_PROVIDER_SITE_OTHER): Payer: Federal, State, Local not specified - PPO

## 2019-03-12 ENCOUNTER — Other Ambulatory Visit: Payer: Self-pay

## 2019-03-12 DIAGNOSIS — M810 Age-related osteoporosis without current pathological fracture: Secondary | ICD-10-CM

## 2019-03-12 MED ORDER — DENOSUMAB 60 MG/ML ~~LOC~~ SOSY
60.0000 mg | PREFILLED_SYRINGE | Freq: Once | SUBCUTANEOUS | Status: AC
  Administered 2019-03-12: 60 mg via SUBCUTANEOUS

## 2019-03-12 NOTE — Progress Notes (Signed)
Per orders of Dr. Earlene Plater, injection of Prolia 60 mg/ML given in right arm by Joseph Art, CMA.  Patient tolerated injection well.  She will have her next injection in 6 months.

## 2019-04-10 ENCOUNTER — Other Ambulatory Visit: Payer: Self-pay

## 2019-04-10 MED ORDER — LAMOTRIGINE 150 MG PO TABS: 150.0000 mg | ORAL_TABLET | Freq: Every day | ORAL | 2 refills | Status: DC

## 2019-04-11 ENCOUNTER — Other Ambulatory Visit: Payer: Self-pay

## 2019-04-11 ENCOUNTER — Ambulatory Visit (INDEPENDENT_AMBULATORY_CARE_PROVIDER_SITE_OTHER): Payer: Federal, State, Local not specified - PPO | Admitting: Psychiatry

## 2019-04-11 ENCOUNTER — Encounter: Payer: Self-pay | Admitting: Psychiatry

## 2019-04-11 DIAGNOSIS — F331 Major depressive disorder, recurrent, moderate: Secondary | ICD-10-CM

## 2019-04-11 DIAGNOSIS — F4329 Adjustment disorder with other symptoms: Secondary | ICD-10-CM

## 2019-04-11 DIAGNOSIS — F4381 Prolonged grief disorder: Secondary | ICD-10-CM

## 2019-04-11 DIAGNOSIS — F5105 Insomnia due to other mental disorder: Secondary | ICD-10-CM | POA: Diagnosis not present

## 2019-04-11 NOTE — Progress Notes (Signed)
Christine Scott [id number] 26-Nov-1941 79 y.o.  Subjective:   Patient ID:  Christine Scott is a 78 y.o. (DOB 18-Nov-1941) female.  Chief Complaint:  Chief Complaint  Patient presents with  . Follow-up    Medication management  . Depression  . Sleeping Problem    HPI Christine Scott presents to the office today for follow-up of bipolar 2 depression and history of alcohol excess.  She was supposed to follow-up in December but has not.  At her last appointment because of depressive symptoms we were going to increase Subvenite 100 mg daily to 150 mg.  Don't feel like it's really helping me.  Wonders about counseling bc still dealing with grief  And guilt from the past and still ruminating. Wants a change in therpist from [facility].  Also had Hospice counseling.  Still feels depressed.  Not highly anxious.  Sleeping okay eating okay.  Wants to try to stop taking the med.   Past Psychiatric Medication Trials: Latuda 40 helped but nervous,    Review of Systems:  Review of Systems  Neurological: Negative for tremors and weakness.    Medications: I have reviewed the patient's current medications.  Current Outpatient Medications  Medication Sig Dispense Refill  . Acetaminophen (TYLENOL PO) Take 1,000 mg by mouth at bedtime.     Marland Kitchen amLODipine (NORVASC) 10 MG tablet Take 1 tablet by mouth daily.    . B Complex-Biotin-FA (B-COMPLEX PO) Take 1 tablet by mouth daily.     . Calcium Carbonate-Vitamin D3 (CALCIUM 600-D) 600-400 MG-UNIT TABS Take 1 tablet by mouth 2 (two) times daily.    Marland Kitchen conjugated estrogens (PREMARIN) vaginal cream Place 1/2 gram per vagina at bedtime twice weekly. 30 g 1  . denosumab (PROLIA) 60 MG/ML SOLN injection Inject 60 mg into the skin every 6 (six) months. Administer in upper arm, thigh, or abdomen    . lamoTRIgine (SUBVENITE) 150 MG tablet Take 1 tablet (150 mg total) by mouth daily. 30 tablet 2  . LORazepam (ATIVAN) 1 MG tablet TAKE 1 TABLET BY MOUTH EVERYDAY AT BEDTIME  90 tablet 0  . Multiple Vitamins-Minerals (MULTIVITAMIN PO) Take 1 tablet by mouth daily.     . Omega-3 Fatty Acids (FISH OIL PO) Take 1 capsule by mouth daily.     Bertram Gala Glycol-Propyl Glycol (SYSTANE ULTRA OP) Apply to eye.    Cliffton Asters Petrolatum-Mineral Oil (SYSTANE NIGHTTIME) OINT Apply to eye.     No current facility-administered medications for this visit.     Medication Side Effects: None  Allergies: No Known Allergies  Past Medical History:  Diagnosis Date  . Abnormal Pap smear of cervix    --hx abnormal paps in her 30s and per patient this was reason for her Hysterectomy  . Depression   . Dry eye   . Hypertension   . Osteoarthritis    hands  . Osteoporosis   . Polymyalgia rheumatica (HCC)     Family History  Problem Relation Age of Onset  . Congestive Heart Failure Mother   . Stroke Mother   . Diabetes Father   . Colon cancer Brother   . Diabetes Brother   . Diabetes Brother   . Stroke Brother   . Colon cancer Daughter     Social History   Socioeconomic History  . Marital status: Widowed    Spouse name: Not on file  . Number of children: Not on file  . Years of education: Not on file  .  Highest education level: Not on file  Occupational History  . Not on file  Social Needs  . Financial resource strain: Not on file  . Food insecurity:    Worry: Not on file    Inability: Not on file  . Transportation needs:    Medical: Not on file    Non-medical: Not on file  Tobacco Use  . Smoking status: Never Smoker  . Smokeless tobacco: Never Used  Substance and Sexual Activity  . Alcohol use: Yes    Alcohol/week: 1.0 standard drinks    Types: 1 Standard drinks or equivalent per week    Comment: occasional--2 glasses of wine/month  . Drug use: Never  . Sexual activity: Never    Partners: Male    Birth control/protection: Surgical    Comment: TVH  Lifestyle  . Physical activity:    Days per week: Not on file    Minutes per session: Not on file  .  Stress: Not on file  Relationships  . Social connections:    Talks on phone: Not on file    Gets together: Not on file    Attends religious service: Not on file    Active member of club or organization: Not on file    Attends meetings of clubs or organizations: Not on file    Relationship status: Not on file  . Intimate partner violence:    Fear of current or ex partner: Not on file    Emotionally abused: Not on file    Physically abused: Not on file    Forced sexual activity: Not on file  Other Topics Concern  . Not on file  Social History Narrative  . Not on file    Past Medical History, Surgical history, Social history, and Family history were reviewed and updated as appropriate.   Please see review of systems for further details on the patient's review from today.   Objective:   Physical Exam:  LMP  (LMP Unknown)   Physical Exam Neurological:     Mental Status: She is alert and oriented to person, place, and time.     Cranial Nerves: No dysarthria.  Psychiatric:        Attention and Perception: Attention normal. She does not perceive auditory hallucinations.        Mood and Affect: Mood is anxious and depressed.        Speech: Speech normal.        Behavior: Behavior is cooperative.        Thought Content: Thought content normal. Thought content is not paranoid or delusional. Thought content does not include homicidal or suicidal ideation. Thought content does not include homicidal or suicidal plan.        Cognition and Memory: Cognition and memory normal.        Judgment: Judgment normal.     Comments: Insight fair to poor. Afraid of dementia. Rigidity complicates treatment.     Lab Review:     Component Value Date/Time   NA 136 02/25/2019 1214   K 4.8 02/25/2019 1214   CL 99 02/25/2019 1214   CO2 29 02/25/2019 1214   GLUCOSE 84 02/25/2019 1214   BUN 20 02/25/2019 1214   CREATININE 0.99 (H) 02/25/2019 1214   CALCIUM 10.9 (H) 02/25/2019 1214   PROT 7.0  02/25/2019 1214   ALBUMIN 4.5 08/17/2018 1150   AST 22 02/25/2019 1214   ALT 11 02/25/2019 1214   ALKPHOS 55 08/17/2018 1150   BILITOT 0.4 02/25/2019  1214   GFRNONAA 55 (L) 02/25/2019 1214   GFRAA 63 02/25/2019 1214       Component Value Date/Time   WBC 6.8 02/25/2019 1214   RBC 3.68 (L) 02/25/2019 1214   HGB 11.1 (L) 02/25/2019 1214   HCT 33.4 (L) 02/25/2019 1214   PLT 220 02/25/2019 1214   MCV 90.8 02/25/2019 1214   MCH 30.2 02/25/2019 1214   MCHC 33.2 02/25/2019 1214   RDW 12.7 02/25/2019 1214   LYMPHSABS 959 02/25/2019 1214   MONOABS 0.4 08/17/2018 1150   EOSABS 61 02/25/2019 1214   BASOSABS 48 02/25/2019 1214    No results found for: POCLITH, LITHIUM   No results found for: PHENYTOIN, PHENOBARB, VALPROATE, CBMZ   .res Assessment: Plan:    Major depressive disorder, recurrent episode, moderate (HCC)  Insomnia due to mental condition  Prolonged grief disorder    Ms. Conrad is chronically ambivalent about taking medications and has poor insight into having bipolar disorder though this is been explained to her multiple times.  She still thinks she does not need to take medication.  Rec against stopping meds lamotrigine bc got worse before.  If the Jordan works for her then she can wean off the lamotrigine.  We discussed the short-term risks associated with benzodiazepines including sedation and increased fall risk among others.  Discussed long-term side effect risk including dependence, potential withdrawal symptoms, and the potential eventual dose-related risk of dementia.  Continue lamotrigine until Jordan works.  Start Latuda bc helped before but lower at 10 mg daily bc of prior SE at 39.  Discussed potential metabolic side effects associated with atypical antipsychotics, as well as potential risk for movement side effects. Advised pt to contact office if movement side effects occur.   Answered her questions about meds.  Call if a problem.  This appt was 30  mins.  I connected with patient by a video enabled telemedicine application or telephone, with their informed consent, and verified patient privacy and that I am speaking with the correct person using two identifiers.  I was located at office and patient at home.  Meredith Staggers, MD, DFAPA    Please see After Visit Summary for patient specific instructions.  Future Appointments  Date Time Provider Department Center  05/08/2019  2:00 PM Patton Salles, MD [facility] None  08/28/2019  9:15 AM Pollyann Savoy, MD [facility] None    No orders of the defined types were placed in this encounter.     -------------------------------

## 2019-04-19 ENCOUNTER — Other Ambulatory Visit: Payer: Self-pay

## 2019-04-19 ENCOUNTER — Ambulatory Visit: Payer: Federal, State, Local not specified - PPO | Admitting: Psychiatry

## 2019-04-19 DIAGNOSIS — F331 Major depressive disorder, recurrent, moderate: Secondary | ICD-10-CM

## 2019-04-19 NOTE — Progress Notes (Signed)
[facility] Counsel[facility] Initial Adult Exam  Name: Christine Scott Date: 04/19/2019  MRN: [id number] DOB: 07/04/1941 PCP: Orland MustardWolfe, Allison, MD  Time spent: 65 minutes   1:00pm to 2:05pm  Virtual Visit via Video Connection : onnected with patient by a video enabled telemedicine/telehealth application or telephone, with their informed consent, and verified patient privacy and that I am speaking with the correct person using two identifiers.I am at [facility] and patient is at home.  I discussed the limitations, risks, security and privacy concerns of performing psychotherapy and management service by telephone and the availability of in person appointments. I also discussed with the patient that there may be a patient responsible charge related to this service. The patient expressed understanding and ageed to proceed. I discussed the treatment planning with the patient. The patient was provided an opportunity to ask questions and all were answered. The patient agreed with the plan and demonstrated an understanding of the instructions. The patient was advised to call  our office if  symptoms worsen or feel they are in a crisis state and need immediate contact.   Guardian/Payee:  patient    Paperwork requested:  No   Reason for Visit /Presenting Problem:  Depression, Anxiety, anger, frustration, sadness  Mental Status Exam:   Appearance:   N/A  (telehealth)  Behavior:  Sharing  Motor:  Normal  Speech/Language:   Clear and Coherent  Affect:  N/A   (telehealth)  Mood:  depressed  Thought process:  goal directed  Thought content:    WNL  Sensory/Perceptual disturbances:    WNL  Orientation:  not oriented to person, place, time/date, situation, day of week, month of year and year  Attention:  Fair  Concentration:  Fair  Memory:  Recent;   Fair Remote;   Fair  Fund of knowledge:   Good  Insight:    Fair  Judgment:   Good  Impulse Control:  Good   Reported Symptoms:  Depression,  anxiety , frustration, sadness  Risk Assessment: Danger to Self:  No Self-injurious Behavior: No Danger to Others: No Duty to Warn:no Physical Aggression / Violence:No  Access to Firearms a concern: No  Gang Involvement:No  Patient / guardian was educated about steps to take if suicide or homicide risk level increases between visits: PATIENT DENIES ANY SI / HI While future psychiatric events cannot be accurately predicted, the patient does not currently require acute inpatient psychiatric care and does not [facility] criteria.  Substance Abuse History: Current substance abuse: WINE- "I probably drink too much sometimes, but not every day.  Weekly is about every other day, 2-3 glasses daily.    Past Psychiatric History:   Previous psychological history is significant for anxiety and depression Outpatient Providers:  Seen by D. Anureet Bruington several yrs ago.  Is involved with AA. History of Psych Hospitalization: No  Psychological Testing: no   Abuse History: Victim of No., n/a   Report needed: No. Victim of Neglect:No. Perpetrator of n/a  Witness / Exposure to Domestic Violence: No   Protective Services Involvement: No  Witness to [employer] Violence:  No   Family History:  Family History  Problem Relation Age of Onset  . Congestive Heart Failure Mother   . Stroke Mother   . Diabetes Father   . Colon cancer Brother   . Diabetes Brother   . Diabetes Brother   . Stroke Brother   . Colon cancer Daughter     Living situation: the patient  lives alone  Sexual Orientation:  Straight  Relationship Status: widowed  Name of spouse / other:N/a  Husband deceased             If a parent, number of children / ages-  Adult childres, daughter is 62 yrs old, daughter 58 yrs  Support Systems; friends, friend Roe Coombs, and adult Programme researcher, broadcasting/film/video Stress:  No   Income/Employment/Disability: Dance movement psychotherapist and Occupational psychologist Service: No    Educational History: Education: college graduate  Religion/Sprituality/World View:   Christian  Any cultural differences that may affect / interfere with treatment:  not applicable    Recreation/Hobbies: walking, exercise, travel  Stressors:Health problems Loss of husband, [name] Substance abuse  Strengths:  Supportive Relationships, Family, Church and AA  Barriers:  ?motivation?  Legal History: Pending legal issue / charges: The patient has no significant history of legal issues. History of legal issue / charges: none  Medical History/Surgical History:REVIEWED WITH PATIENT AND SHE REPORTS NO CHANGES.  D. Slayden Mennenga Past Medical History:  Diagnosis Date  . Abnormal Pap smear of cervix    --hx abnormal paps in her 30s and per patient this was reason for her Hysterectomy  . Depression   . Dry eye   . Hypertension   . Osteoarthritis    hands  . Osteoporosis   . Polymyalgia rheumatica (HCC)     Past Surgical History:  Procedure Laterality Date  . BREAST SURGERY  2016   benign mass removed in [place], [place].  Marland Kitchen FACIAL COSMETIC SURGERY    . TOTAL VAGINAL HYSTERECTOMY  age 68   --due to abnormal pap smears per patient    Medications:  REVIEWED WITH PATIENT AND SHE REPORTS NO CHANGES.   D. Doyce Stonehouse Current Outpatient Medications  Medication Sig Dispense Refill  . Acetaminophen (TYLENOL PO) Take 1,000 mg by mouth at bedtime.     Marland Kitchen amLODipine (NORVASC) 10 MG tablet Take 1 tablet by mouth daily.    . B Complex-Biotin-FA (B-COMPLEX PO) Take 1 tablet by mouth daily.     . Calcium Carbonate-Vitamin D3 (CALCIUM 600-D) 600-400 MG-UNIT TABS Take 1 tablet by mouth 2 (two) times daily.    Marland Kitchen conjugated estrogens (PREMARIN) vaginal cream Place 1/2 gram per vagina at bedtime twice weekly. 30 g 1  . denosumab (PROLIA) 60 MG/ML SOLN injection Inject 60 mg into the skin every 6 (six) months. Administer in upper arm, thigh, or abdomen    . lamoTRIgine (SUBVENITE) 150 MG tablet Take 1 tablet (150  mg total) by mouth daily. 30 tablet 2  . LORazepam (ATIVAN) 1 MG tablet TAKE 1 TABLET BY MOUTH EVERYDAY AT BEDTIME 90 tablet 0  . Multiple Vitamins-Minerals (MULTIVITAMIN PO) Take 1 tablet by mouth daily.     . Omega-3 Fatty Acids (FISH OIL PO) Take 1 capsule by mouth daily.     Bertram Gala Glycol-Propyl Glycol (SYSTANE ULTRA OP) Apply to eye.    Cliffton Asters Petrolatum-Mineral Oil (SYSTANE NIGHTTIME) OINT Apply to eye.     No current facility-administered medications for this visit.     No Known Allergies--NONE  Diagnoses:    ICD-10-CM   1. Major depressive disorder, recurrent episode, moderate (HCC) F33.1     Plan of Care:   Patient is a 78yr old widowed female who lives alone in [place], [place].  States that she has a lengthy history of anxiety and depression and that her husband [name]) died of illness 5 yrs ago in a long term care facility.  Is struggling with: 1) lingering guilt and sadness in reference to husband's death 5 yrs ago, and says "I can't seem to get over it."   2)Guilt issues have strenthened.  3)"I also need to stop caring so much about what others think of me, and not feel guilty about the past."  4)Admits that she is consuming too much wine, which she primarily consumes in the evening.  Does have an AA sponsor.  5) Also states she needs to forgive herself for things in the past. Will follow patient in outpatient therapy focused on the above-mentioned symptoms    Mathis Fare, LCSW

## 2019-04-30 ENCOUNTER — Ambulatory Visit (INDEPENDENT_AMBULATORY_CARE_PROVIDER_SITE_OTHER): Payer: Federal, State, Local not specified - PPO | Admitting: Psychiatry

## 2019-04-30 ENCOUNTER — Other Ambulatory Visit: Payer: Self-pay

## 2019-04-30 DIAGNOSIS — F331 Major depressive disorder, recurrent, moderate: Secondary | ICD-10-CM

## 2019-04-30 NOTE — Progress Notes (Signed)
[facility] Counsel[facility] Adult Progress Note  Name: Christine Scott Date: 04/30/2019  MRN: [id number] DOB: [birth year] PCP: Orland Mustard, MD  Time spent: 60 minutes   4:00pm to 5:00pm  Virtual Visit Note : I connected with patient by a video enabled telemedicine/telehealth application or telephone, with their informed consent, and verified patient privacy and that I am speaking with the correct person using two identifiers.I am at [facility] and patient is at home.   I discussed the limitations, risks, security and privacy concerns of performing psychotherapy and management service by telephone and the availability of in person appointments. I also discussed with the patient that there may be a patient responsible charge related to this service. The patient expressed understanding and ageed to proceed. I discussed the treatment planning with the patient. The patient was provided an opportunity to ask questions and all were answered. The patient agreed with the plan and demonstrated an understanding of the instructions. The patient was advised to call  our office if  symptoms worsen or feel they are in a crisis state and need immediate contact.   Symptoms:   Depression, Anxiety, anger, frustration, sadness  Mental Status Exam:   Appearance:   N/A  (telehealth)  Behavior:  Sharing  Motor:  Normal  Speech/Language:   Clear and Coherent  Affect:  N/A   (telehealth)  Mood:  depressed  Thought process:  goal directed  Thought content:    WNL  Sensory/Perceptual disturbances:    WNL  Orientation:  not oriented to person, place, time/date, situation, day of week, month of year and year  Attention:  Fair  Concentration:  Fair  Memory:  Recent;   Fair Remote;   Fair  [employer] of knowledge:   Good  Insight:    Fair  Judgment:   Good  Impulse Control:  Good     Risk Assessment: Danger to Self:  No Self-injurious Behavior: No Danger to Others: No Duty to Warn:no Physical Aggression /  Violence:No  Access to Firearms a concern: No  Gang Involvement:No    Subjective:  Patient (78 yr old) reports symptoms mentioned above, with depression and sadness being the stronger 2 symptoms. Adds that she has not felt as depressed nor as sad since we last talked about a week ago.  Has tried to change some of her thoughts to be more positive.  Also got out a little more, like going out to [place] and walking around some, although kept good social distancing due to coronavirus situation.  She has become fascinated with 2 owls and their baby which is a white-colored owl at the [facility] and has begun to go there more often.   "I'm doing ok but I think my main problem is that my husband Christine Scott died 5 yrs ago and our marriage wasn't what it should have been, but we didn't fight."  Explained some of her actions that she feels was wrong and feels people gossip about her now.  Explained in detail examples of what she is talking about, and states that she does have some guilt about her prior actions. "Not sure if it's my guilt or other people talking about me or some of both."  A lot of "my problem relates to these issues in my past" and my regrets.    Reviewed from prior visit:   States that she has a lengthy history of anxiety and depression and that her husband Christine Scott) died of illness 5 yrs ago in a long  term care facility.  Is struggling with: 1) lingering guilt and sadness in reference to husband's death 5 yrs ago, and says "I can't seem to get over it."   2)Guilt issues have strenthened.  3)"I also need to stop caring so much about what others think of me, and not feel guilty about the past."  4)Admits that she is consuming too much wine, which she primarily consumes in the evening.  Does have an AA sponsor.  5) Also states she needs to forgive herself for things in the past. Will follow patient in outpatient therapy focused on the above-mentioned symptoms        Diagnoses:    ICD-10-CM    1. Major depressive disorder, recurrent episode, moderate (HCC) F33.1     PLAN:  See above marked "Reviewed from Prior Session".  Established goals further today. Encouraged good self-care for patient and to follow through on her goals, specifically first 2 steps we agreed upon re: stop saying she cannot achieve her goals and do homework assignment re: self-forgiveness in order to move forward.  Next appt to be within 1-2 wks.   Mathis Fareeborah Mell Mellott, LCSW

## 2019-05-06 ENCOUNTER — Telehealth: Payer: Self-pay | Admitting: Family Medicine

## 2019-05-06 ENCOUNTER — Other Ambulatory Visit: Payer: Self-pay

## 2019-05-06 ENCOUNTER — Telehealth: Payer: Self-pay | Admitting: Psychiatry

## 2019-05-06 MED ORDER — LORAZEPAM 1 MG PO TABS: ORAL_TABLET | ORAL | 0 refills | Status: DC

## 2019-05-06 NOTE — Telephone Encounter (Signed)
Copied from CRM [id number]. Topic: Quick Communication - See Telephone Encounter >> May 06, 2019  9:52 AM Terisa Starr wrote: CRM for notification. See Telephone encounter for: 05/06/19. Patient said in February she seen Jarold Motto for bursitis in her left hip. She gave her meloxicam and after 7 days she was pain free. She said she has a lot of the medication left and wants to know if she can start taking it again because her hip has flared up again for the last 2 weeks. She said she can hardly walk. She wants to know should she stay off of her leg?

## 2019-05-06 NOTE — Telephone Encounter (Signed)
See note

## 2019-05-06 NOTE — Telephone Encounter (Signed)
Let her know her GFR was slightly decreased in march when she had lab work done with Dr. Corliss Skains. She can use the meloxicam, but would not use over a few days. If pain persists we can check labs and see if GFR back up so she can take this on more regular basis.

## 2019-05-06 NOTE — Telephone Encounter (Signed)
Patient called and said that she is switching pharmacy to [facility] on [address]. Please send in ativan 1mg  to this pharmacy. Her next appt is 6/3

## 2019-05-06 NOTE — Telephone Encounter (Signed)
Spoke to patient.  Advised of recommendations per Dr. Artis Flock and patient verbalizes understanding and had no further questions at this time.

## 2019-05-06 NOTE — Telephone Encounter (Signed)
pls see message and advise  Doxy visit?

## 2019-05-06 NOTE — Telephone Encounter (Signed)
Pended for approval.

## 2019-05-08 ENCOUNTER — Ambulatory Visit: Payer: Federal, State, Local not specified - PPO | Admitting: Obstetrics and Gynecology

## 2019-05-08 ENCOUNTER — Other Ambulatory Visit: Payer: Self-pay | Admitting: Family Medicine

## 2019-05-08 MED ORDER — AMLODIPINE BESYLATE 10 MG PO TABS: 10.0000 mg | ORAL_TABLET | Freq: Every day | ORAL | 1 refills | Status: DC

## 2019-05-08 NOTE — Telephone Encounter (Signed)
Refilled

## 2019-05-08 NOTE — Telephone Encounter (Signed)
See note

## 2019-05-08 NOTE — Telephone Encounter (Signed)
Copied from CRM [id number]. Topic: Quick Communication - Rx Refill/Question >> May 08, 2019  1:27 PM Gwenlyn Fudge A wrote: Medication: amLODipine (NORVASC) 10 MG tablet   Has the patient contacted their pharmacy? No. (Agent: If no, request that the patient contact the pharmacy for the refill.) (Agent: If yes, when and what did the pharmacy advise?)  Preferred Pharmacy (with phone number or street name): [facility] [address] [place] - [address] [place] [address] Phone: [phone] Fax: [phone] Not a 24 hour pharmacy; exact hours not known.    Agent: Please be advised that RX refills may take up to 3 business days. We ask that you follow-up with your pharmacy.

## 2019-05-13 ENCOUNTER — Other Ambulatory Visit: Payer: Self-pay

## 2019-05-13 ENCOUNTER — Ambulatory Visit (INDEPENDENT_AMBULATORY_CARE_PROVIDER_SITE_OTHER): Payer: Federal, State, Local not specified - PPO | Admitting: Family Medicine

## 2019-05-13 ENCOUNTER — Encounter: Payer: Self-pay | Admitting: Family Medicine

## 2019-05-13 VITALS — BP 124/68 | HR 78 | Temp 98.2°F | Ht 62.0 in | Wt 125.6 lb

## 2019-05-13 DIAGNOSIS — I10 Essential (primary) hypertension: Secondary | ICD-10-CM | POA: Diagnosis not present

## 2019-05-13 LAB — COMPREHENSIVE METABOLIC PANEL
ALT: 10 U/L (ref 0–35)
AST: 19 U/L (ref 0–37)
Albumin: 4 g/dL (ref 3.5–5.2)
Alkaline Phosphatase: 46 U/L (ref 39–117)
BUN: 17 mg/dL (ref 6–23)
CO2: 30 mEq/L (ref 19–32)
Calcium: 9.2 mg/dL (ref 8.4–10.5)
Chloride: 99 mEq/L (ref 96–112)
Creatinine, Ser: 0.83 mg/dL (ref 0.40–1.20)
GFR: 66.31 mL/min (ref >60.00)
Glucose, Bld: 78 mg/dL (ref 70–99)
Potassium: 4.1 mEq/L (ref 3.5–5.1)
Sodium: 136 mEq/L (ref 135–145)
Total Bilirubin: 0.4 mg/dL (ref 0.2–1.2)
Total Protein: 6.4 g/dL (ref 6.0–8.3)

## 2019-05-13 MED ORDER — AMLODIPINE BESYLATE 10 MG PO TABS: 10.0000 mg | ORAL_TABLET | Freq: Every day | ORAL | 1 refills | Status: DC

## 2019-05-13 NOTE — Progress Notes (Signed)
Patient: Christine Scott MRN: [id number] DOB: [birth year] PCP: Orland Mustard, MD     Subjective:  Chief Complaint  Patient presents with  . Hypertension    HPI: The patient is a 78 y.o. female who presents today for hypertension follow up.   Hypertension: Here for follow up of hypertension.  Currently on norvasc 10mg  . Home readings range from 125-140 systolic/60-70 diastolic. Takes medication as prescribed and denies any side effects. Exercise includes walking.  Weight has been stable. Denies any chest pain, headaches, shortness of breath, vision changes, swelling in lower extremities.   Due for prolia injection in September 2020.    Review of Systems  Constitutional: Negative for fatigue.  Eyes: Negative for visual disturbance.  Respiratory: Negative for shortness of breath.   Gastrointestinal: Negative for abdominal pain and nausea.  Neurological: Positive for headaches. Negative for dizziness.  Psychiatric/Behavioral: Negative for sleep disturbance.    Allergies Patient has No Known Allergies.  Past Medical History Patient  has a past medical history of Abnormal Pap smear of cervix, Depression, Dry eye, Hypertension, Osteoarthritis, Osteoporosis, and Polymyalgia rheumatica (HCC).  Surgical History Patient  has a past surgical history that includes Facial cosmetic surgery; Total vaginal hysterectomy (age 3); and Breast surgery (2016).  Family History Pateint's family history includes Colon cancer in her brother and daughter; Congestive Heart Failure in her mother; Diabetes in her brother, brother, and father; Stroke in her brother and mother.  Social History Patient  reports that she has never smoked. She has never used smokeless tobacco. She reports current alcohol use of about 1.0 standard drinks of alcohol per week. She reports that she does not use drugs.    Objective: Vitals:   05/13/19 0914  BP: 124/68  Pulse: 78  Temp: 98.2 F (36.8 C)  TempSrc: Oral   SpO2: 98%  Weight: 125 lb 9.6 oz (57 kg)  Height: 5\' 2"  (1.575 m)    Body mass index is 22.97 kg/m.  Physical Exam Vitals signs reviewed.  HENT:     Nose: Nose normal.  Eyes:     Extraocular Movements: Extraocular movements intact.     Pupils: Pupils are equal, round, and reactive to light.  Neck:     Musculoskeletal: Normal range of motion and neck supple.  Cardiovascular:     Rate and Rhythm: Normal rate and regular rhythm.     Pulses: Normal pulses.  Pulmonary:     Effort: Pulmonary effort is normal.     Breath sounds: Normal breath sounds.  Abdominal:     General: Abdomen is flat. Bowel sounds are normal.     Palpations: Abdomen is soft.  Neurological:     General: No focal deficit present.     Mental Status: She is alert and oriented to person, place, and time.  Psychiatric:        Mood and Affect: Mood normal.        Behavior: Behavior normal.        Assessment/plan: 1. Essential hypertension Blood pressure is to goal. Continue current anti-hypertensive medications. Refills given and routine lab work will be done today. Recommended routine exercise and healthy diet including DASH diet and mediterranean diet. F/u in 6 months.   - Comprehensive metabolic panel   Return in about 6 months (around 11/13/2019).   Orland Mustard, MD [facility]  05/13/2019

## 2019-05-16 ENCOUNTER — Ambulatory Visit: Payer: Federal, State, Local not specified - PPO | Admitting: Psychiatry

## 2019-05-16 ENCOUNTER — Ambulatory Visit: Payer: Federal, State, Local not specified - PPO | Admitting: Obstetrics and Gynecology

## 2019-05-22 ENCOUNTER — Other Ambulatory Visit: Payer: Self-pay | Admitting: Obstetrics and Gynecology

## 2019-05-22 DIAGNOSIS — Z1231 Encounter for screening mammogram for malignant neoplasm of breast: Secondary | ICD-10-CM

## 2019-05-23 ENCOUNTER — Other Ambulatory Visit: Payer: Self-pay

## 2019-05-23 ENCOUNTER — Ambulatory Visit (INDEPENDENT_AMBULATORY_CARE_PROVIDER_SITE_OTHER): Payer: Federal, State, Local not specified - PPO | Admitting: Psychiatry

## 2019-05-23 DIAGNOSIS — F331 Major depressive disorder, recurrent, moderate: Secondary | ICD-10-CM | POA: Diagnosis not present

## 2019-05-23 NOTE — Progress Notes (Signed)
[facility] Counsel[facility] Adult Progress Note  Name: Christine Scott Date: 05/23/2019  MRN: [id number] DOB: 1941/08/17 PCP: Orland Mustard, MD  Time spent: 60 minutes   9:00am to 10am  Virtual Visit Note : I connected with patient by a video enabled telemedicine/telehealth application or telephone, with their informed consent, and verified patient privacy and that I am speaking with the correct person using two identifiers.I am at [facility] and patient is at home.   I discussed the limitations, risks, security and privacy concerns of performing psychotherapy and management service by telephone and the availability of in person appointments. I also discussed with the patient that there may be a patient responsible charge related to this service. The patient expressed understanding and ageed to proceed. I discussed the treatment planning with the patient. The patient was provided an opportunity to ask questions and all were answered. The patient agreed with the plan and demonstrated an understanding of the instructions. The patient was advised to call  our office if  symptoms worsen or feel they are in a crisis state and need immediate contact.   Symptoms:   Depression, Anxiety, anger, frustration, sadness  Mental Status Exam:   Appearance:   N/A  (telehealth)  Behavior:  Sharing  Motor:  Normal (per patient--telehealth)  Speech/Language:   Clear and Coherent  Affect:  N/A   (telehealth)  Mood:  depressed  Thought process:  goal directed  Thought content:    WNL  Sensory/Perceptual disturbances:    WNL  Orientation:  not oriented to person, place, time/date, situation, day of week, month of year and year  Attention:  Fair  Concentration:  Fair  Memory:  Recent;   Fair Remote;   Fair  [employer] of knowledge:   Good  Insight:    Fair  Judgment:   Good  Impulse Control:  Good     Risk Assessment: Danger to Self:  No Self-injurious Behavior: No Danger to Others: No Duty to  Warn:no Physical Aggression / Violence:No  Access to Firearms a concern: No  Gang Involvement:No    Any new medications or health issues since last appointment:   Patient reports none.  Subjective:  Patient reports the symptoms noted above and has felt a little more hope since we last spoke.  Is looking forward to coming back into office in June to talk more.  Just had a good trip to the beach last weekend and felt that helped her also.  Depression is still present but not quite as strong.  Has tried to change some of her thoughts to be more positive.  Also got out a little more, like going out to [place] [facility] and walking around some, although kept good social distancing due to coronavirus situation. Sees her female friend often and feels that is good for her.  She does still have some anxiety and nervousness, that seems rooted in her "bad, guilty feelings about situation in past."  Worked specifically today on some strategies for dealing with the negative memories/feelings and not allowing herself to dwell on them.  Practiced in session with some success.  (patient processed some of info below again today) "I'm doing ok but I think my main problem is that my husband [name] died 5 yrs ago and our marriage wasn't what it should have been, but we didn't fight."  Explained some of her actions that she feels was wrong and feels people gossip about her now.  Explained in detail examples of what she is talking  about, and states that she does have some guilt about her prior actions. "Not sure if it's my guilt or other people talking about me or some of both."  A lot of "my problem relates to these issues in my past" and my regrets.    Reviewed from prior visit:   States that she has a lengthy history of anxiety and depression and that her husband [name]) died of illness 5 yrs ago in a long term care facility.  Is struggling with: 1) lingering guilt and sadness in reference to husband's death 5 yrs ago, and says  "I can't seem to get over it."   2)Guilt issues have strenthened.  3)"I also need to stop caring so much about what others think of me, and not feel guilty about the past."  4)Admits that she is consuming too much wine, which she primarily consumes in the evening.  Does have an AA sponsor.  5) Also states she needs to forgive herself for things in the past. Will follow patient in outpatient therapy focused on the above-mentioned symptoms    Diagnoses:    ICD-10-CM   1. Major depressive disorder, recurrent episode, moderate (HCC) F33.1     PLAN:  See above marked "Reviewed from Prior Session".  Established goals further today. Encouraged good self-care for patient and to follow through on her goals, specifically first 2 steps we agreed upon re: stop saying she cannot achieve her goals and do homework assignment re: self-forgiveness in order to move forward.  Next appt to be next week.   Mathis Fare, LCSW

## 2019-05-29 ENCOUNTER — Ambulatory Visit (INDEPENDENT_AMBULATORY_CARE_PROVIDER_SITE_OTHER): Payer: Federal, State, Local not specified - PPO | Admitting: Psychiatry

## 2019-05-29 ENCOUNTER — Other Ambulatory Visit: Payer: Self-pay

## 2019-05-29 ENCOUNTER — Encounter: Payer: Self-pay | Admitting: Psychiatry

## 2019-05-29 ENCOUNTER — Ambulatory Visit: Payer: Federal, State, Local not specified - PPO | Admitting: Psychiatry

## 2019-05-29 DIAGNOSIS — F3181 Bipolar II disorder: Secondary | ICD-10-CM | POA: Diagnosis not present

## 2019-05-29 DIAGNOSIS — F1010 Alcohol abuse, uncomplicated: Secondary | ICD-10-CM

## 2019-05-29 DIAGNOSIS — F331 Major depressive disorder, recurrent, moderate: Secondary | ICD-10-CM

## 2019-05-29 DIAGNOSIS — F4381 Prolonged grief disorder: Secondary | ICD-10-CM

## 2019-05-29 DIAGNOSIS — F4329 Adjustment disorder with other symptoms: Secondary | ICD-10-CM | POA: Diagnosis not present

## 2019-05-29 DIAGNOSIS — F5105 Insomnia due to other mental disorder: Secondary | ICD-10-CM

## 2019-05-29 MED ORDER — TRAZODONE HCL 50 MG PO TABS: 50.0000 mg | ORAL_TABLET | Freq: Every day | ORAL | 1 refills | Status: DC

## 2019-05-29 NOTE — Progress Notes (Signed)
Christine Scott [id number] Dec 09, 1941 78 y.o.  Subjective:   Patient ID:  Christine Scott is a 78 y.o. (DOB 1941-04-01) female.  Chief Complaint:  Chief Complaint  Patient presents with  . Follow-up    Medication Management  . Depression    Medication Management  . Medication Problem    wants off meds    HPI Christine Scott presents to the office today for follow-up of bipolar 2 depression and history of alcohol excess.    Her last appt was April 10.  Was anxious and depressed and was to start Latuda.  She didn't bc she decided her main priority is stopping psych meds.    Started sleep meds bc of husband's illness and death.  Now wants to try to stop meds.  Has not tried reducing the lorazepam.  Asks about hydroxyzine.   Pt reports that mood is Depressed and but not as bad as it was and describes anxiety as Minimal. Anxiety symptoms include: Excessive Worry,. Pt reports no sleep issues. Pt reports that appetite is good. Pt reports that energy is good and good. Concentration is good. Suicidal thoughts:  denied by patient.  She was supposed to follow-up in December but has not.  At her last appointment because of depressive symptoms we were going to increase Subvenite 100 mg daily to 150 mg.  Don't feel like it's really helping me.  Wonders about counseling bc still dealing with grief  And guilt from the past and still ruminating. Wants a change in therpist from [facility].  Also had Hospice counseling.  Still feels depressed.  Not highly anxious.  Sleeping okay eating okay.  Past Psychiatric Medication Trials: Latuda 40 helped but nervous, lamotrigine, lorazepam, lithium, venlafaxine, sertraline, fluoxetine, Pristiq, Abilify, Wellbutrin 150 no response   Review of Systems:  Review of Systems  Neurological: Negative for tremors and weakness.  Psychiatric/Behavioral: Positive for depression.    Medications: I have reviewed the patient's current medications.  Current Outpatient  Medications  Medication Sig Dispense Refill  . Acetaminophen (TYLENOL PO) Take 1,000 mg by mouth at bedtime.     Marland Kitchen amLODipine (NORVASC) 10 MG tablet Take 1 tablet (10 mg total) by mouth daily. 90 tablet 1  . B Complex-Biotin-FA (B-COMPLEX PO) Take 1 tablet by mouth daily.     . Calcium Carbonate-Vitamin D3 (CALCIUM 600-D) 600-400 MG-UNIT TABS Take 1 tablet by mouth 2 (two) times daily.    Marland Kitchen conjugated estrogens (PREMARIN) vaginal cream Place 1/2 gram per vagina at bedtime twice weekly. 30 g 1  . denosumab (PROLIA) 60 MG/ML SOLN injection Inject 60 mg into the skin every 6 (six) months. Administer in upper arm, thigh, or abdomen    . lamoTRIgine (SUBVENITE) 150 MG tablet Take 1 tablet (150 mg total) by mouth daily. 30 tablet 2  . LORazepam (ATIVAN) 1 MG tablet TAKE 1 TABLET BY MOUTH EVERYDAY AT BEDTIME 30 tablet 0  . Multiple Vitamins-Minerals (MULTIVITAMIN PO) Take 1 tablet by mouth daily.     . Omega-3 Fatty Acids (FISH OIL PO) Take 1 capsule by mouth daily.     Bertram Gala Glycol-Propyl Glycol (SYSTANE ULTRA OP) Apply to eye.    Cliffton Asters Petrolatum-Mineral Oil (SYSTANE NIGHTTIME) OINT Apply to eye.    . traZODone (DESYREL) 50 MG tablet Take 1 tablet (50 mg total) by mouth at bedtime. 30 tablet 1   No current facility-administered medications for this visit.     Medication Side Effects: None  Allergies: No Known  Allergies  Past Medical History:  Diagnosis Date  . Abnormal Pap smear of cervix    --hx abnormal paps in her 30s and per patient this was reason for her Hysterectomy  . Depression   . Dry eye   . Hypertension   . Osteoarthritis    hands  . Osteoporosis   . Polymyalgia rheumatica (HCC)     Family History  Problem Relation Age of Onset  . Congestive Heart Failure Mother   . Stroke Mother   . Diabetes Father   . Colon cancer Brother   . Diabetes Brother   . Diabetes Brother   . Stroke Brother   . Colon cancer Daughter     Social History   Socioeconomic History   . Marital status: Widowed    Spouse name: Not on file  . Number of children: Not on file  . Years of education: Not on file  . Highest education level: Not on file  Occupational History  . Not on file  Social Needs  . Financial resource strain: Not on file  . Food insecurity:    Worry: Not on file    Inability: Not on file  . Transportation needs:    Medical: Not on file    Non-medical: Not on file  Tobacco Use  . Smoking status: Never Smoker  . Smokeless tobacco: Never Used  Substance and Sexual Activity  . Alcohol use: Yes    Alcohol/week: 1.0 standard drinks    Types: 1 Standard drinks or equivalent per week    Comment: occasional--2 glasses of wine/month  . Drug use: Never  . Sexual activity: Never    Partners: Male    Birth control/protection: Surgical    Comment: TVH  Lifestyle  . Physical activity:    Days per week: Not on file    Minutes per session: Not on file  . Stress: Not on file  Relationships  . Social connections:    Talks on phone: Not on file    Gets together: Not on file    Attends religious service: Not on file    Active member of club or organization: Not on file    Attends meetings of clubs or organizations: Not on file    Relationship status: Not on file  . Intimate partner violence:    Fear of current or ex partner: Not on file    Emotionally abused: Not on file    Physically abused: Not on file    Forced sexual activity: Not on file  Other Topics Concern  . Not on file  Social History Narrative  . Not on file    Past Medical History, Surgical history, Social history, and Family history were reviewed and updated as appropriate.   Please see review of systems for further details on the patient's review from today.   Objective:   Physical Exam:  LMP  (LMP Unknown)   Physical Exam Neurological:     Mental Status: She is alert and oriented to person, place, and time.     Cranial Nerves: No dysarthria.  Psychiatric:         Attention and Perception: Attention normal. She does not perceive auditory hallucinations.        Mood and Affect: Mood is anxious and depressed.        Speech: Speech normal.        Behavior: Behavior is cooperative.        Thought Content: Thought content normal. Thought content is not  paranoid or delusional. Thought content does not include homicidal or suicidal ideation. Thought content does not include homicidal or suicidal plan.        Cognition and Memory: Cognition and memory normal.        Judgment: Judgment normal.     Comments: Insight fair to poor. Afraid of dementia. Rigidity complicates treatment.     Lab Review:     Component Value Date/Time   NA 136 05/13/2019 0947   K 4.1 05/13/2019 0947   CL 99 05/13/2019 0947   CO2 30 05/13/2019 0947   GLUCOSE 78 05/13/2019 0947   BUN 17 05/13/2019 0947   CREATININE 0.83 05/13/2019 0947   CREATININE 0.99 (H) 02/25/2019 1214   CALCIUM 9.2 05/13/2019 0947   PROT 6.4 05/13/2019 0947   ALBUMIN 4.0 05/13/2019 0947   AST 19 05/13/2019 0947   ALT 10 05/13/2019 0947   ALKPHOS 46 05/13/2019 0947   BILITOT 0.4 05/13/2019 0947   GFRNONAA 55 (L) 02/25/2019 1214   GFRAA 63 02/25/2019 1214       Component Value Date/Time   WBC 6.8 02/25/2019 1214   RBC 3.68 (L) 02/25/2019 1214   HGB 11.1 (L) 02/25/2019 1214   HCT 33.4 (L) 02/25/2019 1214   PLT 220 02/25/2019 1214   MCV 90.8 02/25/2019 1214   MCH 30.2 02/25/2019 1214   MCHC 33.2 02/25/2019 1214   RDW 12.7 02/25/2019 1214   LYMPHSABS 959 02/25/2019 1214   MONOABS 0.4 08/17/2018 1150   EOSABS 61 02/25/2019 1214   BASOSABS 48 02/25/2019 1214    No results found for: POCLITH, LITHIUM   No results found for: PHENYTOIN, PHENOBARB, VALPROATE, CBMZ   .res Assessment: Plan:    Insomnia due to mental condition  Bipolar II disorder (HCC)  Prolonged grief disorder  Alcohol use disorder, mild, abuse    Greater than 50% of face to face time with patient was spent on  counseling and coordination of care. We discussed the following:  Ms. [name] is chronically ambivalent about taking medications and has poor insight into having bipolar disorder though this is been explained to her multiple times.  In fact we have had this discussion since her very first visit in 2010.  Her daughter also felt that she was bipolar.  She still thinks she does not need to take medication.  She does not accept the bipolar diagnosis.  We have had extensive discussions of the subject on multiple appointments.  While she had agreed last time to take the Latuda she has changed her mind about that and insists on stopping all medications.  Rec against stopping meds lamotrigine bc got worse before.    She has decided she wants to discontinue lamotrigine therefore, Reduce Subvenite now to 100 mg for 4 weeks, then reduce to 1/2 of 100 mg for 2 weeks then stop it.  She wants to come off of the lorazepam as well and try to sleep without it.  She feels that she is dealt with some of the pathological grief and is made progress and is not as anxious at night as she used to be.  She is also concerned about the medication because she knows it could have an adverse effect on her memory.  She asked about hydroxyzine but that can also have an adverse effect on memory.  She has never taken trazodone.  That would be a better choice.  She could use the trazodone while she is trying to taper off of the lorazepam as  needed.  Discussed side effects of trazodone in detail.  Cut lorazepam in half for at least 3 weeks and if sleeping ok stop it.  Use trazodone if needed for sleep while coming off lorazepam.  It takes at least 3 weeks to fully adjust to stopping the lorazepam.  We discussed the short-term risks associated with benzodiazepines including sedation and increased fall risk among others.  Discussed long-term side effect risk including dependence, potential withdrawal symptoms, and the potential eventual  dose-related risk of dementia.  Answered her questions about meds.    Call if depression or mood swings recur.  Call if the insomnia is unmanageable with the plan noted above.  Discussed sleep hygiene.  Discussed alcohol use.  This appt was 30 mins.  FU 3 mos  Meredith Staggers, MD, DFAPA    Please see After Visit Summary for patient specific instructions.  Future Appointments  Date Time Provider Department Center  06/20/2019  8:00 AM Mathis Fare, LCSW [facility] None  06/27/2019 10:00 AM Mathis Fare, LCSW [facility] None  07/04/2019 10:00 AM Mathis Fare, LCSW [facility] None  07/08/2019  3:00 PM Patton Salles, MD [facility] None  07/11/2019 11:00 AM [facility] MM 2 [facility]  08/27/2019 11:15 AM Cottle, Steva Ready., MD [facility] None  08/28/2019  9:15 AM Pollyann Savoy, MD [facility] None  11/13/2019 11:00 AM Orland Mustard, MD [facility]    No orders of the defined types were placed in this encounter.     -------------------------------

## 2019-05-29 NOTE — Patient Instructions (Addendum)
Cut lorazepam in half for at least 3 weeks and if sleeping ok stop it.  Use trazodone if needed for sleep while coming off lorazepam.  It takes at least 3 weeks to fully adjust to stopping the lorazepam.  Reduce Subvenite now to 100 mg for 4 weeks, then reduce to 1/2 of 100 mg for 2 weeks then stop it.  Call if the depression gets worse off the medicine.  Call if the insomnia is not manageable off the lorazepam.

## 2019-05-29 NOTE — Progress Notes (Signed)
[facility] Counselor Adult Progress Note  Name: Christine Scott Date: 05/29/2019  MRN: [id number] DOB: 10/11/1941 PCP: Christine Scott Christine Scott, Allison, MD  Time spent: 60 minutes   10:00am to 11:00am   Symptoms:   Depression, Anxiety, anger, frustration, sadness  Mental Status Exam:   Appearance:   N/A  (telehealth)  Behavior:  Sharing  Motor:  Normal (per patient--telehealth)  Speech/Language:   Clear and Coherent  Affect:  N/A   (telehealth)  Mood:  depressed  Thought process:  goal directed  Thought content:    WNL  Sensory/Perceptual disturbances:    WNL  Orientation:  not oriented to person, place, time/date, situation, day of week, month of year and year  Attention:  Fair  Concentration:  Fair  Memory:  Recent;   Fair Remote;   Fair  [employer] Energy[employer] of knowledge:   Good  Insight:    Fair  Judgment:   Good  Impulse Control:  Good     Risk Assessment: Danger to Self:  No Self-injurious Behavior: No Danger to Others: No Duty to Warn:no Physical Aggression / Violence:No  Access to Firearms a concern: No  Gang Involvement:No    Any new medications or health issues since last appointment:   Patient reports none.  Subjective:  Patient reports continuing symptoms as listed above. States the anger and anxieyt has lessened and still having difficulty going back to the past and past regrets. ale friend often and feels that is good for her.  She does still have some anxiety and nervousness, that seems rooted in her "bad, guilty feelings about situation in past."  "Makes me sad when I think people are judging me". All of these things have tended to consume patient and used to sometimes self-medicate with wine.  Has cut back the consumption of wine a lot.  Last time she drank any was 2 glasses on Mother's Day.  The day after she consumed more and did reach out to AA again and has a sponsor that she is in contact with.   Worked again today specifically today on some strategies for dealing with the negative  memories/feelings and not allowing herself to dwell on them.  CBT techniques, changing her self-talk, and staying in the present were all used in our work together today.  Practiced in session with patient on strategies.   (patient processed some of info below again today) "I'm doing ok but I think my main problem is that my husband Christine Scott died 5 yrs ago and our marriage wasn't what it should have been, but we didn't fight."  Explained some of her actions that she feels was wrong and feels people gossip about her now.  Explained in detail examples of what she is talking about, and states that she does have some guilt about her prior actions. "Not sure if it's my guilt or other people talking about me or some of both."  A lot of "my problem relates to these issues in my past" and my regrets.    Reviewed from prior visits:   States that she has a lengthy history of anxiety and depression and that her husband Christine Sexton(Christine Scott) died of illness 5 yrs ago in a long term care facility.  Is struggling with: 1) lingering guilt and sadness in reference to husband's death 5 yrs ago, and says "I can't seem to get over it."   2)Guilt issues have strenthened.  3)"I also need to stop caring so much about what others think of me, and not feel guilty  about the past."  4)Admits that she is consuming too much wine, which she primarily consumes in the evening.  Does have an AA sponsor.  5) Also states she needs to forgive herself for things in the past. Will follow patient in outpatient therapy focused on the above-mentioned symptoms    Diagnoses:    ICD-10-CM   1. Major depressive disorder, recurrent episode, moderate (HCC) F33.1     PLAN:  See above marked "Reviewed from Prior Session".  Established goals further today. Encouraged good self-care for patient and to follow through on her goals, specifically first 2 steps we agreed upon re: stop saying she cannot achieve her goals and do homework assignment re: self-forgiveness  in order to move forward.  Next appt to be next week.   Mathis Fare, LCSW

## 2019-06-12 ENCOUNTER — Telehealth: Payer: Self-pay | Admitting: Psychiatry

## 2019-06-12 ENCOUNTER — Telehealth: Payer: Self-pay

## 2019-06-12 NOTE — Telephone Encounter (Signed)
Agreed -

## 2019-06-12 NOTE — Telephone Encounter (Signed)
Opened in error

## 2019-06-12 NOTE — Telephone Encounter (Signed)
Pt left v-mail. Has questions about sleeping meds. Return call @ [phone]

## 2019-06-12 NOTE — Telephone Encounter (Signed)
Returned patients call. She was wanting to know if it was okay to take the Trazodone while coming off of the Lorazepam. I went over the instructions with her from her last visit for coming off of the Lamotrigine and Lorazepam and  let her know that this is okay to take them together. Pt. Just wanted to make sure and she verbalized understanding of instructions.

## 2019-06-13 NOTE — Telephone Encounter (Signed)
See previous phone message. 

## 2019-06-20 ENCOUNTER — Other Ambulatory Visit: Payer: Self-pay

## 2019-06-20 ENCOUNTER — Encounter (INDEPENDENT_AMBULATORY_CARE_PROVIDER_SITE_OTHER): Payer: Self-pay

## 2019-06-20 ENCOUNTER — Ambulatory Visit (INDEPENDENT_AMBULATORY_CARE_PROVIDER_SITE_OTHER): Payer: Federal, State, Local not specified - PPO | Admitting: Psychiatry

## 2019-06-20 DIAGNOSIS — F331 Major depressive disorder, recurrent, moderate: Secondary | ICD-10-CM

## 2019-06-20 NOTE — Progress Notes (Signed)
[facility] Counsel[facility] Adult Progress Note  Name: Christine Scott Date: 06/20/2019  MRN: [id number] DOB: 1941/04/18 PCP: Orma Flaming, MD  Time spent: 60 minutes   8:am to 9:00am   Symptoms:   Depression, Anxiety, frustration  Mental Status Exam:   Appearance:   casual  Behavior:  Sharing  Motor:  Normal  Speech/Language:   Clear and Coherent  Affect:  Anxious, depressed  Mood:  depressed, anxious  Thought process:  goal directed  Thought content:    WNL  Sensory/Perceptual disturbances:    WNL  Orientation:  not oriented to person, place, time/date, situation, day of week, month of year and year  Attention:  Fair  Concentration:  Fair  Memory:  Recent;   Fair Remote;   [place] of knowledge:   Good  Insight:    Fair  Judgment:   Good  Impulse Control:  Good     Risk Assessment: Danger to Self:  No Self-injurious Behavior: No Danger to Others: No Duty to Warn:no Physical Aggression / Violence:No  Access to Firearms a concern: No  Gang Involvement:No    Any new medications or health issues since last appointment:   Patient reports none.  Subjective:  Patient in today and reports symptoms noted above. States the anger and sadness has decreased some.  Depression and anxiety continue and patient denies any SI / HI.  Focused heavily on her guilt from past "bad choices" before husband died and tend to feel "people that know, still judge me".  Did speak about her faith and God having forgiven her however patient struggles with the self-forgiveness, which gets tangled up with the fact of "other people knowing my business."  Even thought she has used alcohol to self-medicate in past, she still denies any use of alcohol now and stays in touch with her [place] sponsor.  Worked again today specifically today on some strategies in dealing with the negative memories/feelings and not allowing herself to dwell on them.  CBT techniques, changing her self-talk, and staying in the present  were all used in our work together today.  Practiced in session with patient on strategies.  Working to get rid of "I can't" statements.  Reviewed from prior visits:   States that she has a lengthy history of anxiety and depression and that her husband Deidre Ala) died of illness 5 yrs ago in a long term care facility.  Is struggling with: 1) lingering guilt and sadness in reference to husband's death 5 yrs ago, and says "I can't seem to get over it."   2)Guilt issues have leveled out some and don't seem to be increasing but not decreasing right now. Marland Kitchen  3)"I also need to stop caring so much about what others think of me, and not feel guilty about the past."  4)Admits that she is consuming too much wine, which she primarily consumes in the evening.  Does have an [place] sponsor.  5) Also states she needs to forgive herself for things in the past. Will follow patient in outpatient therapy focused on the above-mentioned symptoms    Diagnoses:    ICD-10-CM   1. Major depressive disorder, recurrent episode, moderate (HCC)  F33.1     PLAN:  See above marked "Reviewed from Prior Session".  Established goals further today and goal review with some progress noted.  Encouraged good self-care for patient and to follow through on her goals, specifically first 2 steps we agreed upon re: stop saying she cannot achieve her goals  and do homework assignment re: self-forgiveness in order to move forward.  Next appt to be next week.   Mathis Fareeborah Jenelle Drennon, LCSW

## 2019-06-26 ENCOUNTER — Telehealth: Payer: Self-pay | Admitting: Family Medicine

## 2019-06-27 ENCOUNTER — Other Ambulatory Visit: Payer: Self-pay

## 2019-06-27 ENCOUNTER — Ambulatory Visit (INDEPENDENT_AMBULATORY_CARE_PROVIDER_SITE_OTHER): Payer: Federal, State, Local not specified - PPO | Admitting: Psychiatry

## 2019-06-27 ENCOUNTER — Other Ambulatory Visit: Payer: Self-pay | Admitting: Psychiatry

## 2019-06-27 ENCOUNTER — Telehealth: Payer: Self-pay | Admitting: Psychiatry

## 2019-06-27 DIAGNOSIS — F331 Major depressive disorder, recurrent, moderate: Secondary | ICD-10-CM

## 2019-06-27 NOTE — Telephone Encounter (Signed)
She is likely having some degree of withdrawal from the lorazepam at night.  She wants to stop this medication so I would advise that she stay off this medication.  It will get better over the next couple of weeks.  In the meantime she can try increasing trazodone from 50 to 75 mg or even 100 mg at night if she can tolerate it without dizziness.  It may or may not work.  But the sleep will gradually improve if she is patient

## 2019-06-27 NOTE — Progress Notes (Signed)
[facility] Adult Progress Note  Name: Christine Scott Date: 06/27/2019  MRN: [id number] DOB: 12/23/1941 PCP: Orland MustardWolfe, Allison, MD  Time spent: 60 minutes   10:00am to 11:00am   Symptoms:   Depression, Anxiety, frustration  Mental Status Exam:   Appearance:   casual  Behavior:  Sharing  Motor:  Normal  Speech/Language:   Clear and Coherent  Affect:  Anxious, depressed  Mood:   anxious, depressed "but depression is some better"  Thought process:  Able to focus on goals  Thought content:    WNL  Sensory/Perceptual disturbances:    WNL  Orientation:  not oriented to person, place, time/date, situation, day of week, month of year and year  Attention:  Fair  Concentration:  Fair  Memory:  Recent;   Fair Remote;   Fair  [employer] of knowledge:   Good  Insight:    Fair  Judgment:   Good  Impulse Control:  Good     Risk Assessment: Danger to Self:  No Self-injurious Behavior: No Danger to Others: No Duty to Warn:no Physical Aggression / Violence:No  Access to Firearms a concern: No  Gang Involvement:No    Any new medications or health issues since last appointment:   Patient reports Dr.  Jennelle Humanottle changed some of her meds and is doing so gradually.  Patient states she wants to be on less meds.  See electronic medical record reflecting changes.  Subjective: Patient in today and reports symptoms noted above. Reports depression has lessened some however her anxiety and "worry" have continued. Did follow up some on her homework from last session. Acknowledged that and continued focus on how to be more intentional and follow through more consistently.  Having difficulty staying away from the negative thoughts, practicing any forgiveness, and trusting God's forgiveness. Also difficult not to assume she knows what others may know about her past and further assumes they judge her because of that.  Worked more today on strategies to help her in working on self forgiveness and letting go of  regrets, ways she feels she has made poor choices, and second-guessing others and assuming they know of some of her poor choices and holds it against her.  Discussed her assumptions and also her guilt that she feels in these issues.   Has sometimes used alcohol to self-medicate in past, she still denies any use of alcohol now and stays in touch with her AA sponsor.  CBT techniques, changing her self-talk, and staying in the present were all used again n our work together today.  Practiced in session with patient on strategies.  Still working to get rid of "I can't" statements and showing some progress on that.  Reviewed from a prior visit:   States that she has a lengthy history of anxiety and depression and that her husband [name](Ralph) died of illness 5 yrs ago in a long term care facility.  Is struggling with: 1) lingering guilt and sadness in reference to husband's death 5 yrs ago, and says "I can't seem to get over it."   2)Guilt issues have leveled out some and don't seem to be increasing but not decreasing right now. Marland Kitchen.  3)"I also need to stop caring so much about what others think of me, and not feel guilty about the past."  4)Admits that she is consuming too much wine, which she primarily consumes in the evening.  Does have an AA sponsor.  5) Also states she needs to forgive herself for things in the  past. Will follow patient in outpatient therapy focused on the above-mentioned symptoms    Diagnoses:    ICD-10-CM   1. Major depressive disorder, recurrent episode, moderate (HCC)  F33.1     PLAN:   Established goals further today and goal review with some minimal progress noted.  Encouraged good self-care for patient and to follow through on her goals, specifically first 2 steps we agreed upon re: stop saying she cannot achieve her goals and do homework assignment re: self-forgiveness in order to move forward.  Next appt to be next week.   Shanon Ace, LCSW

## 2019-06-27 NOTE — Telephone Encounter (Signed)
Patient was in for an appointment with Christine Scott and when checking out said that she has stopped the lorazapam on June 28th and has been taking the trazadone. However the last 3 nights the trazadone has not helped with her sleeping. She has only been able to sleep about 3 to 4 hours a night. She doesn't know if she needs to increase the trazadone or stop it. I looked back at dr. Casimiro Needle note and it said that the lorazapam will take 3 weeks to get out of her system. I told her to continue taking the trazadone until she hears back from [place].

## 2019-06-28 NOTE — Telephone Encounter (Signed)
Tried to reach pt, but no answer and unable to leave voicemail

## 2019-07-01 NOTE — Telephone Encounter (Signed)
Left voicemail with information and to call back 

## 2019-07-04 ENCOUNTER — Other Ambulatory Visit: Payer: Self-pay

## 2019-07-04 ENCOUNTER — Ambulatory Visit: Payer: Self-pay

## 2019-07-04 ENCOUNTER — Encounter: Payer: Self-pay | Admitting: Family Medicine

## 2019-07-04 ENCOUNTER — Ambulatory Visit: Payer: Federal, State, Local not specified - PPO | Admitting: Psychiatry

## 2019-07-04 ENCOUNTER — Ambulatory Visit (INDEPENDENT_AMBULATORY_CARE_PROVIDER_SITE_OTHER): Payer: Federal, State, Local not specified - PPO | Admitting: Family Medicine

## 2019-07-04 VITALS — BP 120/70 | HR 75 | Ht 62.0 in | Wt 121.0 lb

## 2019-07-04 DIAGNOSIS — M7062 Trochanteric bursitis, left hip: Secondary | ICD-10-CM | POA: Diagnosis not present

## 2019-07-04 DIAGNOSIS — M25552 Pain in left hip: Secondary | ICD-10-CM | POA: Diagnosis not present

## 2019-07-04 DIAGNOSIS — M5416 Radiculopathy, lumbar region: Secondary | ICD-10-CM

## 2019-07-04 DIAGNOSIS — F331 Major depressive disorder, recurrent, moderate: Secondary | ICD-10-CM | POA: Diagnosis not present

## 2019-07-04 NOTE — Patient Instructions (Signed)
Nice to meet you  Ice 20 minutes 2 times daily. Usually after activity and before bed. pennsaid pinkie amount topically 2 times daily as needed.  Exercises 3 times a week.  God shoes can help  Stay active  See em again in 5-6 weeks if not perfect

## 2019-07-04 NOTE — Progress Notes (Signed)
[facility] [address], [place] [address] Phone: [phone] Subjective:   I [name] am serving as a Education administrator for Dr. Hulan Saas.  I'm seeing this patient by the request  of:  Orma Flaming, MD   CC:  Hip pain   [id number]  Christine Scott is a 78 y.o. female coming in with complaint of hip pain. Pain can get so bad she can hardly walk. After walking about 1-2 miles she experiences pain. Has been told she has bursitis. Some times she gets back pain as well as radiating pain down the left leg.   Onset- 9 months  Location - Lateral Duration-  Character-aching sensation that hurts in the lateral aspect of the hip.  No groin pain. Aggravating factors- walking, sitting  Reliving factors-  Therapies tried- PT, meloxicam  Severity-8 out of 10.   xrays 02/12/2019 normal of left hip   Past Medical History:  Diagnosis Date  . Abnormal Pap smear of cervix    --hx abnormal paps in her 30s and per patient this was reason for her Hysterectomy  . Depression   . Dry eye   . Hypertension   . Osteoarthritis    hands  . Osteoporosis   . Polymyalgia rheumatica (HCC)    Past Surgical History:  Procedure Laterality Date  . BREAST SURGERY  2016   benign mass removed in [place], [place].  Marland Kitchen FACIAL COSMETIC SURGERY    . TOTAL VAGINAL HYSTERECTOMY  age 61   --due to abnormal pap smears per patient   Social History   Socioeconomic History  . Marital status: Widowed    Spouse name: Not on file  . Number of children: Not on file  . Years of education: Not on file  . Highest education level: Not on file  Occupational History  . Not on file  Social Needs  . Financial resource strain: Not on file  . Food insecurity    Worry: Not on file    Inability: Not on file  . Transportation needs    Medical: Not on file    Non-medical: Not on file  Tobacco Use  . Smoking status: Never Smoker  . Smokeless tobacco: Never Used  Substance and Sexual  Activity  . Alcohol use: Yes    Alcohol/week: 1.0 standard drinks    Types: 1 Standard drinks or equivalent per week    Comment: occasional--2 glasses of wine/month  . Drug use: Never  . Sexual activity: Never    Partners: Male    Birth control/protection: Surgical    Comment: TVH  Lifestyle  . Physical activity    Days per week: Not on file    Minutes per session: Not on file  . Stress: Not on file  Relationships  . Social Herbalist on phone: Not on file    Gets together: Not on file    Attends religious service: Not on file    Active member of club or organization: Not on file    Attends meetings of clubs or organizations: Not on file    Relationship status: Not on file  Other Topics Concern  . Not on file  Social History Narrative  . Not on file   No Known Allergies Family History  Problem Relation Age of Onset  . Congestive Heart Failure Mother   . Stroke Mother   . Diabetes Father   . Colon cancer Brother   . Diabetes Brother   .  Diabetes Brother   . Stroke Brother   . Colon cancer Daughter     Current Outpatient Medications (Endocrine & Metabolic):  .  denosumab (PROLIA) 60 MG/ML SOLN injection, Inject 60 mg into the skin every 6 (six) months. Administer in upper arm, thigh, or abdomen  Current Outpatient Medications (Cardiovascular):  .  amLODipine (NORVASC) 10 MG tablet, Take 1 tablet (10 mg total) by mouth daily.   Current Outpatient Medications (Analgesics):  Marland Kitchen.  Acetaminophen (TYLENOL PO), Take 1,000 mg by mouth at bedtime.    Current Outpatient Medications (Other):  Marland Kitchen.  B Complex-Biotin-FA (B-COMPLEX PO), Take 1 tablet by mouth daily.  .  Calcium Carbonate-Vitamin D3 (CALCIUM 600-D) 600-400 MG-UNIT TABS, Take 1 tablet by mouth 2 (two) times daily. Marland Kitchen.  conjugated estrogens (PREMARIN) vaginal cream, Place 1/2 gram per vagina at bedtime twice weekly. Marland Kitchen.  lamoTRIgine (SUBVENITE) 150 MG tablet, Take 1 tablet (150 mg total) by mouth daily. Marland Kitchen.   LORazepam (ATIVAN) 1 MG tablet, TAKE 1 TABLET BY MOUTH EVERYDAY AT BEDTIME .  Multiple Vitamins-Minerals (MULTIVITAMIN PO), Take 1 tablet by mouth daily.  .  Omega-3 Fatty Acids (FISH OIL PO), Take 1 capsule by mouth daily.  Bertram Gala.  Polyethyl Glycol-Propyl Glycol (SYSTANE ULTRA OP), Apply to eye. .  traZODone (DESYREL) 50 MG tablet, Take 1 tablet (50 mg total) by mouth at bedtime. Cliffton Asters.  White Petrolatum-Mineral Oil (SYSTANE NIGHTTIME) OINT, Apply to eye.    Past medical history, social, surgical and family history all reviewed in electronic medical record.  No pertanent information unless stated regarding to the chief complaint.   Review of Systems:  No headache, visual changes, nausea, vomiting, diarrhea, constipation, dizziness, abdominal pain, skin rash, fevers, chills, night sweats, weight loss, swollen lymph nodes, body aches, joint swelling,  chest pain, shortness of breath, mood changes.  Positive muscle aches  Objective  Blood pressure 120/70, pulse 75, height 5\' 2"  (1.575 m), weight 121 lb (54.9 kg), SpO2 97 %.   General: No apparent distress alert and oriented x3 mood and affect normal, dressed appropriately.  HEENT: Pupils equal, extraocular movements intact  Respiratory: Patient's speak in full sentences and does not appear short of breath  Cardiovascular: No lower extremity edema, non tender, no erythema  Skin: Warm dry intact with no signs of infection or rash on extremities or on axial skeleton.  Abdomen: Soft nontender  Neuro: Cranial nerves II through XII are intact, neurovascularly intact in all extremities with 2+ DTRs and 2+ pulses.  Lymph: No lymphadenopathy of posterior or anterior cervical chain or axillae bilaterally.  Gait antalgic gait MSK:  tender with full range of motion and good stability and symmetric strength and tone of shoulders, elbows, wrist, , knee and ankles bilaterally.  Arthritic changes of multiple joints Left hip exam shows the patient does have some mild  fullness on the lateral aspect of the hip.  Severely tender to palpation over the greater trochanteric area.  Mild positive Faber test with pain over the lateral hip.  Minimal pain to palpation over the sacroiliac joint.  No pain in the groin.  No pain with internal range of motion.  Negative straight leg test.   Procedure: Real-time Ultrasound Guided Injection of left  greater trochanteric bursitis secondary to patient's body habitus Device: GE Logiq Q7  Ultrasound guided injection is preferred based studies that show increased duration, increased effect, greater accuracy, decreased procedural pain, increased response rate, and decreased cost with ultrasound guided versus blind injection.  Verbal informed consent  obtained.  Time-out conducted.  Noted no overlying erythema, induration, or other signs of local infection.  Skin prepped in a sterile fashion.  Local anesthesia: Topical Ethyl chloride.  With sterile technique and under real time ultrasound guidance:  Greater trochanteric area was visualized and patient's bursa was noted. A 22-gauge 3 inch needle was inserted and 4 cc of 0.5% Marcaine and 1 cc of Kenalog 40 mg/dL was injected. Pictures taken Completed without difficulty  Pain immediately resolved suggesting accurate placement of the medication.  Advised to call if fevers/chills, erythema, induration, drainage, or persistent bleeding.  Images permanently stored and available for review in the ultrasound unit.  Impression: Technically successful ultrasound guided injection.  [id number]; 15 additional minutes spent for Therapeutic exercises as stated in above notes.  This included exercises focusing on stretching, strengthening, with significant focus on eccentric aspects.   Long term goals include an improvement in range of motion, strength, endurance as well as avoiding reinjury. Patient's frequency would include in 1-2 times a day, 3-5 times a week for a duration of 6-12 weeks. Hip  strengthening exercises which included:  Pelvic tilt/bracing to help with proper recruitment of the lower abs and pelvic floor muscles  Glute strengthening to properly contract glutes without over-engaging low back and hamstrings - prone hip extension and glute bridge exercises Proper stretching techniques to increase effectiveness for the hip flexors, groin, quads, piriformic and low back when appropriate    Proper technique shown and discussed handout in great detail with ATC.  All questions were discussed and answered.     Impression and Recommendations:     This case required medical decision making of moderate complexity. The above documentation has been reviewed and is accurate and complete Judi SaaZachary M Shakeem Stern, DO       Note: This dictation was prepared with Dragon dictation along with smaller phrase technology. Any transcriptional errors that result from this process are unintentional.

## 2019-07-04 NOTE — Assessment & Plan Note (Signed)
Greater trochanteric bursitis.  Given injection today.  Tolerated the procedure well.  Discussed hip abductor strengthening.  Work with Product/process development scientist to learn home exercises in greater detail.  Discussed icing regimen.  Topical anti-inflammatories.  Worsening symptoms will consider the possibility of formal physical therapy.  Differential includes a lumbar radiculopathy but highly unlikely.  Follow-up again 6 weeks

## 2019-07-04 NOTE — Telephone Encounter (Signed)
Pt in office with counselor today asking about trazodone and if she can decrease medication now. Stating she's sleeping well with trazodone 50 mg 1 at hs, but wants to wean off this too, instructed to decrease to 25 mg for at least a week to make sure sleeping well with reduction. Instructed to follow up as needed. Verbalized understanding

## 2019-07-04 NOTE — Progress Notes (Signed)
[facility] Adult Progress Note  Name: Christine Scott Date: 07/07/2019  MRN: [id number] DOB: 02/06/1941 PCP: Orland MustardWolfe, Allison, MD  Time spent: 60 minutes   10:00am to 11:00am   Symptoms:   Depression, Anxiety, frustration  Mental Status Exam:   Appearance:   casual  Behavior:  Sharing  Motor:  Normal  Speech/Language:   Clear and Coherent  Affect:  Anxious, depressed  Mood:   anxious, depressed "but depression is some better"  Thought process:  Able to focus on goals  Thought content:    WNL  Sensory/Perceptual disturbances:    WNL  Orientation:  not oriented to person, place, time/date, situation, day of week, month of year and year  Attention:  Fair  Concentration:  Fair  Memory:  Recent;   Fair Remote;   Fair  [employer] of knowledge:   Good  Insight:    Fair  Judgment:   Good  Impulse Control:  Good     Risk Assessment: Danger to Self:  No Self-injurious Behavior: No Danger to Others: No Duty to Warn:no Physical Aggression / Violence:No  Access to Firearms a concern: No  Gang Involvement:No    Any new medications or health issues since last appointment:   Patient reports Dr Jennelle Humanottle changed some of her meds and is doing so gradually.  Patient says she had wanted this change. Patient states she wants to be on less meds.  See electronic medical record reflecting changes.  Subjective:  Patient experiencing anxiety, depression, and frustration. Is showing more efforts towards her goals especially working to stop her worrying about "what others may think of her because of some of her past".  In conversation, she demonstrated less frequent thoughts about this, as it used to be very, very  frequent. It is still happening but also a noticeable decrease and she actually caught herself once during session today expressing concerns about what a neighbor thought of her choice on an issue.  Recognizing some how dwelling on the negative thoughts and assumptions, feeds her depression  and anxiety.  Reports today that her depression is some better.  Followed up on homework and actually brought in written notes which were helpful in focusing on specific thoughts and behaviors, and changes she is noticing.  Did share concerns she is having with relationship with a female "friend" and talked about this in detail as it is impacting her emotionally and socially.  (Due to patient request today for privacy, not all details are noted in her record, however there are no safety issues involved.)  Overall, it seems patient is wanting more "space" and is having difficulty in communicating this to friend.   Even thought it's difficult letting go of the negative thoughts, practicing self-forgiveness, and trusting God's forgiveness. Today's session, she did seem to be loosening her grip some on the self-negating and actually considering the benefits of forgiveness and positive thoughts.  Also difficult not to assume she knows what others may know about her past and further assumes they judge her because of that. Focused more on this today as well as strategies to help her in working on self forgiveness and letting go of regrets, ways she feels she has made poor choices, and second-guessing others and assuming they know of some of her poor choices and holds it against her.  Discussed her assumptions and also the resulting guilt that she feels in these issues.   Has sometimes used alcohol to self-medicate in past, she still denies any use of  alcohol now and stays in touch with her AA sponsor and her family members locally and in [facility].  Working harder to "stay in the present" more. Practiced in session with patient on strategies.  Still working to get rid of "I can't" statements and showing some definite progress on that.  Reviewed from a prior visits as patient finds it helpful:   States that she has a lengthy history of anxiety and depression and that her husband Deidre Ala) died of illness 5 yrs ago in a long  term care facility.  Is struggling with: 1) lingering guilt and sadness in reference to husband's death 5 yrs ago, and says "I can't seem to get over it."   2)Guilt issues have leveled out some and don't seem to be increasing but not decreasing right now. Marland Kitchen  3)"I also need to stop caring so much about what others think of me, and not feel guilty about the past."  4)Admits that she is consuming too much wine, which she primarily consumes in the evening.  Does have an [employer] sponsor.  5) Also states she needs to forgive herself for things in the past. Will follow patient in outpatient therapy focused on the above-mentioned symptoms    Diagnoses:    ICD-10-CM   1. Major depressive disorder, recurrent episode, moderate (HCC)  F33.1     PLAN:   Established goals further today and goal review with some minimal progress noted.  Encouraged good self-care for patient and to follow through on her goals, specifically first 2 steps we agreed upon re: stop saying she cannot achieve her goals and do homework assignment re: self-forgiveness in order to move forward.  Next appt to be approx 3-4 wks.   Shanon Ace, LCSW

## 2019-07-07 NOTE — Addendum Note (Signed)
Addended byShanon Ace on: 07/07/2019 08:06 PM   Modules accepted: Level of Service

## 2019-07-08 ENCOUNTER — Encounter: Payer: Self-pay | Admitting: Obstetrics and Gynecology

## 2019-07-08 ENCOUNTER — Ambulatory Visit: Payer: Federal, State, Local not specified - PPO | Admitting: Obstetrics and Gynecology

## 2019-07-08 ENCOUNTER — Other Ambulatory Visit: Payer: Self-pay

## 2019-07-08 VITALS — BP 134/72 | HR 68 | Temp 98.4°F | Resp 14 | Ht 60.5 in | Wt 122.4 lb

## 2019-07-08 DIAGNOSIS — Z01419 Encounter for gynecological examination (general) (routine) without abnormal findings: Secondary | ICD-10-CM | POA: Diagnosis not present

## 2019-07-08 MED ORDER — PREMARIN 0.625 MG/GM VA CREA: TOPICAL_CREAM | VAGINAL | 1 refills | Status: DC

## 2019-07-08 NOTE — Progress Notes (Signed)
78 y.o. G5P2004 Widowed Caucasian female here for annual exam.  Patient states that she has leg swelling through out the day She has some moles under her bra line she would like to have looked at today.   She takes medication for blood pressure control.   She uses the vaginal estrogen cream sometimes.   Quit using ETOH.   She sees dermatologist in September.   States she wants to do colon cancer screening.   FH of diabetes.   PCP:   Orma Flaming  No LMP recorded (lmp unknown). Patient has had a hysterectomy.           Sexually active: No.  The current method of family planning is hysterectomy -- ovaries remain.    Exercising: Yes.    Walking daily 2-3 miles Smoker:  no  Health Maintenance: Pap:  04-13-16 Neg; 2002 Neg History of abnormal Pap:  Yes, hx abnormal paps in her 5s and the reason for her hysterectomy MMG:  05/07/18 BIRADS 1 negative/density b -- scheduled 07/11/19 Colonoscopy:  2018 normal per patient--no need for further testing BMD:   11/2017  Result  Osteopenia.  Treating with Prolia. TDaP:  2011 HIV and Hep C: 04/27/18 Negative Screening Labs:  PCP   reports that she has never smoked. She has never used smokeless tobacco. She reports current alcohol use of about 1.0 standard drinks of alcohol per week. She reports that she does not use drugs.  Past Medical History:  Diagnosis Date  . Abnormal Pap smear of cervix    --hx abnormal paps in her 30s and per patient this was reason for her Hysterectomy  . Depression   . Dry eye   . Hypertension   . Osteoarthritis    hands  . Osteoporosis   . Polymyalgia rheumatica (HCC)     Past Surgical History:  Procedure Laterality Date  . BREAST SURGERY  2016   benign mass removed in [place], [place].  Marland Kitchen FACIAL COSMETIC SURGERY    . TOTAL VAGINAL HYSTERECTOMY  age 45   --due to abnormal pap smears per patient    Current Outpatient Medications  Medication Sig Dispense Refill  . Acetaminophen (TYLENOL PO) Take 1,000 mg  by mouth at bedtime.     Marland Kitchen amLODipine (NORVASC) 10 MG tablet Take 1 tablet (10 mg total) by mouth daily. 90 tablet 1  . B Complex-Biotin-FA (B-COMPLEX PO) Take 1 tablet by mouth daily.     . Calcium Carbonate-Vitamin D3 (CALCIUM 600-D) 600-400 MG-UNIT TABS Take 1 tablet by mouth 2 (two) times daily.    Marland Kitchen conjugated estrogens (PREMARIN) vaginal cream Place 1/2 gram per vagina at bedtime twice weekly. 30 g 1  . denosumab (PROLIA) 60 MG/ML SOLN injection Inject 60 mg into the skin every 6 (six) months. Administer in upper arm, thigh, or abdomen    . lamoTRIgine (SUBVENITE) 150 MG tablet Take 1 tablet (150 mg total) by mouth daily. (Patient taking differently: Take 100 mg by mouth daily. ) 30 tablet 2  . Multiple Vitamins-Minerals (MULTIVITAMIN PO) Take 1 tablet by mouth daily.     . Omega-3 Fatty Acids (FISH OIL PO) Take 1 capsule by mouth daily.     Vladimir Faster Glycol-Propyl Glycol (SYSTANE ULTRA OP) Apply to eye.    . traZODone (DESYREL) 50 MG tablet Take 1 tablet (50 mg total) by mouth at bedtime. 30 tablet 1  . White Petrolatum-Mineral Oil (SYSTANE NIGHTTIME) OINT Apply to eye.    Marland Kitchen LORazepam (ATIVAN) 1 MG  tablet TAKE 1 TABLET BY MOUTH EVERYDAY AT BEDTIME (Patient not taking: Reported on 07/08/2019) 30 tablet 0   No current facility-administered medications for this visit.     Family History  Problem Relation Age of Onset  . Congestive Heart Failure Mother   . Stroke Mother   . Diabetes Father   . Colon cancer Brother   . Diabetes Brother   . Diabetes Brother   . Stroke Brother   . Colon cancer Daughter     Review of Systems  All other systems reviewed and are negative.   Exam:   BP 134/72   Pulse 68   Temp 98.4 F (36.9 C)   Resp 14   Ht 5' 0.5" (1.537 m)   Wt 122 lb 6.4 oz (55.5 kg)   LMP  (LMP Unknown)   BMI 23.51 kg/m     General appearance: alert, cooperative and appears stated age Head: normocephalic, without obvious abnormality, atraumatic Neck: no adenopathy,  supple, symmetrical, trachea midline and thyroid normal to inspection and palpation Lungs: clear to auscultation bilaterally Breasts: normal appearance, no masses or tenderness, No nipple retraction or dimpling, No nipple discharge or bleeding, No axillary adenopathy Heart: regular rate and rhythm Abdomen: soft, non-tender; no masses, no organomegaly Extremities: extremities normal, atraumatic, no cyanosis or edema Skin: skin color, texture, turgor normal. No rashes or lesions Lymph nodes: cervical, supraclavicular, and axillary nodes normal. Neurologic: grossly normal  Pelvic: External genitalia:  no lesions              No abnormal inguinal nodes palpated.              Urethra:  normal appearing urethra with no masses, tenderness or lesions              Bartholins and Skenes: normal                 Vagina: normal appearing vagina with normal color and discharge, no lesions.  Absence of rugae.               Cervix:  absent              Pap taken: No. Bimanual Exam:  Uterus:   absent              Adnexa: no mass, fullness, tenderness              Rectal exam: Yes.  .  Confirms.              Anus:  normal sphincter tone, no lesions  Chaperone was present for exam.  Assessment:   Well woman visit with normal exam. Status post TVH due to abnormal paps.  Ovaries remain. History of left breast biopsy. Benign per patient.  Osteoporosis. On Prolia. Vaginal atrophy.   Multiple nevi.  Looks like seborrheic dermatosis.  LE edema.  None today.   Plan: Mammogram screening discussed. Self breast awareness reviewed. Pap and HR HPV as above. Guidelines for Calcium, Vitamin D, regular exercise program including cardiovascular and weight bearing exercise. Refill of vaginal estrogen cream.  I discussed potential effect on breast cancer.  Fu with dermatology as scheduled.  She will follow up with her PCP if her LE swelling persists. I recommend she consider IFOB through her PCP.  Follow up  annually and prn.   After visit summary provided.

## 2019-07-08 NOTE — Patient Instructions (Signed)

## 2019-07-11 ENCOUNTER — Ambulatory Visit
Admission: RE | Admit: 2019-07-11 | Discharge: 2019-07-11 | Disposition: A | Discharge status: Home / Self Care | Payer: Federal, State, Local not specified - PPO | Source: Ambulatory Visit | Attending: Obstetrics and Gynecology | Admitting: Obstetrics and Gynecology

## 2019-07-11 ENCOUNTER — Other Ambulatory Visit: Payer: Self-pay

## 2019-07-11 DIAGNOSIS — Z1231 Encounter for screening mammogram for malignant neoplasm of breast: Secondary | ICD-10-CM

## 2019-07-23 ENCOUNTER — Ambulatory Visit (INDEPENDENT_AMBULATORY_CARE_PROVIDER_SITE_OTHER): Payer: Federal, State, Local not specified - PPO | Admitting: Physician Assistant

## 2019-07-23 ENCOUNTER — Encounter: Payer: Self-pay | Admitting: Physician Assistant

## 2019-07-23 ENCOUNTER — Other Ambulatory Visit: Payer: Self-pay

## 2019-07-23 DIAGNOSIS — G4700 Insomnia, unspecified: Secondary | ICD-10-CM

## 2019-07-23 MED ORDER — TRAZODONE HCL 50 MG PO TABS: 25.0000 mg | ORAL_TABLET | Freq: Every day | ORAL | 1 refills | Status: DC

## 2019-07-23 NOTE — Progress Notes (Signed)
Virtual Visit via Video   I connected with Christine Scott on 07/23/19 at  7:40 AM EDT by a video enabled telemedicine application and verified that I am speaking with the correct person using two identifiers. Location patient: Home Location provider: [facility], Office Persons participating in the virtual visit: Christine Scott, Jump PA-C, Anselmo Pickler LPN  I discussed the limitations of evaluation and management by telemedicine and the availability of in person appointments. The patient expressed understanding and agreed to proceed.  I acted as a Education administrator for [employer], [employer], LPN  Subjective:   HPI:   Insomnia Pt c/o having trouble sleeping past few weeks. She has only been getting 6 hours of sleep but waking up every 2 hours or so. Pt was on Lorazepam 1 mg but stopped 6/28 due to she was told not good for memory and habit forming. She took it for 5 yrs. Pt was started on Trazodone 50 mg by her Psychiatrist, but stopped on 7/17. Pt does not want to be on anything habit forming or that has side effects. Pt is worried about getting dementia.  ROS: See pertinent positives and negatives per HPI.  Patient Active Problem List   Diagnosis Date Noted  . Greater trochanteric bursitis of left hip 07/04/2019  . DDD (degenerative disc disease), lumbar 02/25/2019  . Bipolar II disorder ([place]) 12/07/2018  . 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 History of depression 08/12/2017  . Essential hypertension 08/12/2017  . Lumbar radiculopathy 06/15/2017  . Sjogren's syndrome 12/25/2016  . Polymyalgia rheumatica ([place]) 12/25/2016  . 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 Primary osteoarthritis of both feet 12/25/2016  . Osteoporosis 12/25/2016    Social History   Tobacco Use  . Smoking status: Never Smoker  . Smokeless tobacco: Never Used  Substance Use Topics  . Alcohol use: Yes    Alcohol/week: 1.0 standard drinks    Types: 1 Standard  drinks or equivalent per week    Comment: occasional--2 glasses of wine/month    Current Outpatient Medications:  .  Acetaminophen (TYLENOL PO), Take 1,000 mg by mouth at bedtime. , Disp: , Rfl:  .  amLODipine (NORVASC) 10 MG tablet, Take 1 tablet (10 mg total) by mouth daily., Disp: 90 tablet, Rfl: 1 .  B Complex-Biotin-FA (B-COMPLEX PO), Take 1 tablet by mouth daily. , Disp: , Rfl:  .  Calcium Carbonate-Vitamin D3 (CALCIUM 600-D) 600-400 MG-UNIT TABS, Take 1 tablet by mouth 2 (two) times daily., Disp: , Rfl:  .  conjugated estrogens (PREMARIN) vaginal cream, Place 1/2 gram per vagina at bedtime twice weekly., Disp: 30 g, Rfl: 1 .  denosumab (PROLIA) 60 MG/ML SOLN injection, Inject 60 mg into the skin every 6 (six) months. Administer in upper arm, thigh, or abdomen, Disp: , Rfl:  .  Multiple Vitamins-Minerals (MULTIVITAMIN PO), Take 1 tablet by mouth daily. , Disp: , Rfl:  .  Omega-3 Fatty Acids (FISH OIL PO), Take 1 capsule by mouth daily. , Disp: , Rfl:  .  Polyethyl Glycol-Propyl Glycol (SYSTANE ULTRA OP), Apply to eye., Disp: , Rfl:  .  White Petrolatum-Mineral Oil (SYSTANE NIGHTTIME) OINT, Apply to eye., Disp: , Rfl:  .  traZODone (DESYREL) 50 MG tablet, Take 0.5 tablets (25 mg total) by mouth at bedtime., Disp: 30 tablet, Rfl: 1  No Known Allergies  Objective:   VITALS: Per patient if applicable, see vitals. GENERAL: Alert, appears well and in no acute distress. HEENT: Atraumatic,  conjunctiva clear, no obvious abnormalities on inspection of external nose and ears. NECK: Normal movements of the head and neck. CARDIOPULMONARY: No increased WOB. Speaking in clear sentences. I:E ratio WNL.  MS: Moves all visible extremities without noticeable abnormality. PSYCH: Pleasant and cooperative, well-groomed. Speech normal rate and rhythm. Affect is appropriate. Insight and judgement are appropriate. Attention is focused, linear, and appropriate.  NEURO: CN grossly intact. Oriented as arrived  to appointment on time with no prompting. Moves both UE equally.  SKIN: No obvious lesions, wounds, erythema, or cyanosis noted on face or hands.  Assessment and Plan:   Christine Scott was seen today for insomnia.  Diagnoses and all orders for this visit:  Insomnia, unspecified type  Other orders -     traZODone (DESYREL) 50 MG tablet; Take 0.5 tablets (25 mg total) by mouth at bedtime.   Long discussion regarding this today and our options. She states melatonin does not work for her. She is interested in Atarax, but due to her Sjogrens, I'm not certain this is the best option for her. I do think 25 mg trazodone is a good place to re-start and see how she does with this. Long discussion about how this medication is non-habit forming and safe for elderly patients. She does not drink alcohol on this medication. Follow-up if symptoms persist or do not improve with this regimen.  . Reviewed expectations re: course of current medical issues. . Discussed self-management of symptoms. . Outlined signs and symptoms indicating need for more acute intervention. . Patient verbalized understanding and all questions were answered. Marland Kitchen. Health Maintenance issues including appropriate healthy diet, exercise, and smoking avoidance were discussed with patient. . See orders for this visit as documented in the electronic medical record.  I discussed the assessment and treatment plan with the patient. The patient was provided an opportunity to ask questions and all were answered. The patient agreed with the plan and demonstrated an understanding of the instructions.   The patient was advised to call back or seek an in-person evaluation if the symptoms worsen or if the condition fails to improve as anticipated.   CMA or LPN served as scribe during this visit. History, Physical, and Plan performed by medical provider. The above documentation has been reviewed and is accurate and complete.  I spent 25 minutes with this  patient, greater than 50% was face-to-face time counseling regarding the above diagnoses.  Emerald MountainSamantha Tonna Palazzi, GeorgiaPA 07/23/2019

## 2019-07-31 ENCOUNTER — Ambulatory Visit: Payer: Federal, State, Local not specified - PPO | Admitting: Psychiatry

## 2019-08-13 ENCOUNTER — Ambulatory Visit: Payer: Federal, State, Local not specified - PPO | Admitting: Family Medicine

## 2019-08-13 ENCOUNTER — Ambulatory Visit: Payer: Self-pay

## 2019-08-13 NOTE — Telephone Encounter (Signed)
Incoming call from Patient with complaint of feeling dizzy. Onset was Sunday stated once she laid down.  On Sunday she felt better. Rates the dizzynes as moderate.   Blood pressure was 121/46 heart rate was 68.Patent has a schedule an appointment for Tuesday this week wonders if it were something available for today.  Triaged Pt.  nothing available for today. Related to Patient if sx worsen between today and tommorow    To seek Urgent care or ED.  Patient voiced understanding.  Reason for Disposition . [1] MODERATE dizziness (e.g., interferes with normal activities) AND [2] has NOT been evaluated by physician for this  (Exception: dizziness caused by heat exposure, sudden standing, or poor fluid intake)  Answer Assessment - Initial Assessment Questions 1. DESCRIPTION: "Describe your dizziness."      2. LIGHTHEADED: "Do you feel lightheaded?" (e.g., somewhat faint, woozy, weak upon standing)     Faint and woozy balance is off.  3. VERTIGO: "Do you feel like either you or the room is spinning or tilting?" (i.e. vertigo)     denies 4. SEVERITY: "How bad is it?"  "Do you feel like you are going to faint?" "Can you stand and walk?"   - MILD - walking normally   - MODERATE - interferes with normal activities (e.g., work, school)    - SEVERE - unable to stand, requires support to walk, feels like passing out now.    moderate 5. ONSET:  "When did the dizziness begin?"      Sunday 6. AGGRAVATING FACTORS: "Does anything make it worse?" (e.g., standing, change in head position) Denies     HEART RATE: "Can you tell me your heart rate?" "How many beats in 15 seconds?"  (Note: not all patients can do this)      12 1/46 hr 68 8. CAUSE: "What do you think is causing the dizziness?"     *No Answer* 9. RECURRENT SYMPTOM: "Have you had dizziness before?" If so, ask: "When was the last time?" "What happened that time?"     *No Answer* 10. OTHER SYMPTOMS: "Do you have any other symptoms?" (e.g., fever, chest  pain, vomiting, diarrhea, bleeding)      denies 11. PREGNANCY: "Is there any chance you are pregnant?" "When was your last menstrual period?"      na  Protocols used: DIZZINESS [facility]

## 2019-08-13 NOTE — Telephone Encounter (Signed)
Noted  

## 2019-08-13 NOTE — Telephone Encounter (Signed)
See below

## 2019-08-14 ENCOUNTER — Encounter: Payer: Self-pay | Admitting: Family Medicine

## 2019-08-14 ENCOUNTER — Other Ambulatory Visit: Payer: Self-pay

## 2019-08-14 ENCOUNTER — Ambulatory Visit: Payer: Federal, State, Local not specified - PPO | Admitting: Family Medicine

## 2019-08-14 ENCOUNTER — Telehealth: Payer: Self-pay | Admitting: Family Medicine

## 2019-08-14 VITALS — BP 116/68 | HR 67 | Temp 97.7°F | Ht 60.5 in | Wt 118.4 lb

## 2019-08-14 DIAGNOSIS — I10 Essential (primary) hypertension: Secondary | ICD-10-CM | POA: Diagnosis not present

## 2019-08-14 DIAGNOSIS — R42 Dizziness and giddiness: Secondary | ICD-10-CM | POA: Diagnosis not present

## 2019-08-14 DIAGNOSIS — R41840 Attention and concentration deficit: Secondary | ICD-10-CM | POA: Diagnosis not present

## 2019-08-14 NOTE — Progress Notes (Deleted)
Office Visit Note  Patient: Christine Scott             Date of Birth: 01/17/1941           MRN: [id number]             PCP: Orland MustardWolfe, Allison, MD Referring: Orland MustardWolfe, Allison, MD Visit Date: 08/28/2019 Occupation: @GUAROCC @  Subjective:  No chief complaint on file.   History of Present Illness: Christine DyFrances G Houdeshell is a 78 y.o. female ***   Activities of Daily Living:  Patient reports morning stiffness for *** {minute/hour:19697}.   Patient {ACTIONS;DENIES/REPORTS:21021675::"Denies"} nocturnal pain.  Difficulty dressing/grooming: {ACTIONS;DENIES/REPORTS:21021675::"Denies"} Difficulty climbing stairs: {ACTIONS;DENIES/REPORTS:21021675::"Denies"} Difficulty getting out of chair: {ACTIONS;DENIES/REPORTS:21021675::"Denies"} Difficulty using hands for taps, buttons, cutlery, and/or writing: {ACTIONS;DENIES/REPORTS:21021675::"Denies"}  No Rheumatology ROS completed.   PMFS History:  Patient Active Problem List   Diagnosis Date Noted  . Greater trochanteric bursitis of left hip 07/04/2019  . DDD (degenerative disc disease), lumbar 02/25/2019  . Bipolar II disorder ([facility]) 12/07/2018  . 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 History of depression 08/12/2017  . Essential hypertension 08/12/2017  . Lumbar radiculopathy 06/15/2017  . Sjogren's syndrome 12/25/2016  . 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 Primary osteoarthritis of both feet 12/25/2016  . Osteoporosis 12/25/2016    Past Medical History:  Diagnosis Date  . Abnormal Pap smear of cervix    --hx abnormal paps in her 30s and per patient this was reason for her Hysterectomy  . Depression   . Dry eye   . Hypertension   . Osteoarthritis    hands  . Osteoporosis   . Polymyalgia rheumatica ([facility])     Family History  Problem Relation Age of Onset  . Congestive Heart Failure Mother   . Stroke Mother   . Diabetes Father   . Colon cancer Brother   . Diabetes Brother    . Diabetes Brother   . Stroke Brother   . Colon cancer Daughter    Past Surgical History:  Procedure Laterality Date  . BREAST SURGERY  [place]  Marland Kitchen. FACIAL COSMETIC SURGERY    . TOTAL VAGINAL HYSTERECTOMY  age 78   --due to abnormal pap smears per patient   Social History   Social History Narrative  . Not on file   Immunization History  Administered Date(s) Administered  . Influenza, High Dose Seasonal PF 10/21/2017, 11/27/2018  . Influenza-Unspecified 10/13/2016  . Pneumococcal Conjugate-13 08/17/2018  . Pneumococcal Polysaccharide-23 01/19/2016  . Tdap 12/26/2009     Objective: Vital Signs: LMP  (LMP Unknown)    Physical Exam   Musculoskeletal Exam: ***  CDAI Exam: CDAI Score: - Patient Global: -; Provider Global: - Swollen: -; Tender: - Joint Exam   No joint exam has been documented for this visit   There is currently no information documented on the homunculus. Go to the Rheumatology activity and complete the homunculus joint exam.  Investigation: No additional findings.  Imaging: No results found.  Recent Labs: Lab Results  Component Value Date   WBC 6.8 02/25/2019   HGB 11.1 (L) 02/25/2019   PLT 220 02/25/2019   NA 136 05/13/2019   K 4.1 05/13/2019   CL 99 05/13/2019   CO2 30 05/13/2019   GLUCOSE 78 05/13/2019   BUN 17 05/13/2019   CREATININE 0.83 05/13/2019   BILITOT 0.4 05/13/2019   ALKPHOS 46 05/13/2019   AST 19 05/13/2019  ALT 10 05/13/2019   PROT 6.4 05/13/2019   ALBUMIN 4.0 05/13/2019   CALCIUM 9.2 05/13/2019   GFRAA 63 02/25/2019    Speciality Comments: No specialty comments available.  Procedures:  No procedures performed Allergies: Patient has no known allergies.   Assessment / Plan:     Visit Diagnoses: No diagnosis found.  Orders: No orders of the defined types were placed in this encounter.  No orders of the defined types were placed in this encounter.   Face-to-face time  spent with patient was *** minutes. Greater than 50% of time was spent in counseling and coordination of care.  Follow-Up Instructions: No follow-ups on file.   Ofilia Neas, PA-C  Note - This record has been created using Dragon software.  Chart creation errors have been sought, but may not always  have been located. Such creation errors do not reflect on  the standard of medical care.

## 2019-08-14 NOTE — Progress Notes (Signed)
Patient: Christine Scott MRN: [id number] DOB: 12/30/1940 PCP: Orma Flaming, MD     Subjective:  Chief Complaint  Patient presents with  . Medication Management    HPI: The patient is a 78 y.o. female who presents today for dizziness and blood pressure check. She started to have symptoms when she weaned off medication: lorazepam, subvenite and then started trazodone. She started to feel dizzy last Sunday. She got out of bed and felt dizzy and things were blurry. She was able to walk 2 miles after this. Got better throughout day. Monday woke up dizzy and got better. Tuesday and today its better. Still feels like balance a little off and looks like a film is over things due to mild dizziness. She is still on 25mg  of trazodone. Dizziness is worse when she turns her head. It's better when she sits still. World is spinning and she is standing still. No ringing in her ears and no pain in her ears. She wears hearing aides and hears fine. No change in hearing. Had her eyes examined less than 6 months ago. Had eye appointment scheduled.   She also is wondering if her blood pressure is too low. She has log of her readings and average it's 120/50. Her diastolic tends to be running low on her cuff. She not fatigued, has no dizziness with standing from sitting or lying down.   She also mentions she thinks she has a hard time concentrating and is wanting to know about medication for this.   Review of Systems  Constitutional: Positive for fatigue. Negative for chills and fever.  HENT: Negative for congestion.   Eyes: Positive for visual disturbance.  Respiratory: Negative for cough and shortness of breath.   Cardiovascular: Positive for leg swelling. Negative for chest pain and palpitations.  Gastrointestinal: Negative for abdominal pain, diarrhea, nausea and vomiting.  Musculoskeletal: Positive for myalgias. Negative for back pain and neck pain.  Skin: Negative.   Neurological: Positive for dizziness.  Negative for facial asymmetry, weakness and headaches.  Psychiatric/Behavioral: Positive for sleep disturbance.    Allergies Patient has No Known Allergies.  Past Medical History Patient  has a past medical history of Abnormal Pap smear of cervix, Depression, Dry eye, Hypertension, Osteoarthritis, Osteoporosis, and Polymyalgia rheumatica ([place]).  Surgical History Patient  has a past surgical history that includes Facial cosmetic surgery; Total vaginal hysterectomy (age 89); and Breast surgery (2016).  Family History Pateint's family history includes Colon cancer in her brother and daughter; Congestive Heart Failure in her mother; Diabetes in her brother, brother, and father; Stroke in her brother and mother.  Social History Patient  reports that she has never smoked. She has never used smokeless tobacco. She reports current alcohol use of about 1.0 standard drinks of alcohol per week. She reports that she does not use drugs.    Objective: Vitals:   08/14/19 1142  BP: 116/68  Pulse: 67  Temp: 97.7 F (36.5 C)  TempSrc: Other (Comment)  SpO2: 98%  Weight: 118 lb 6.4 oz (53.7 kg)  Height: 5' 0.5" (1.537 m)    Body mass index is 22.74 kg/m.   No data found. orthostatics checked and okay. See vitals.   Physical Exam Vitals signs reviewed.  Constitutional:      Appearance: Normal appearance. She is normal weight.  HENT:     Head: Normocephalic and atraumatic.     Right Ear: Tympanic membrane, ear canal and external ear normal.     Left Ear: Tympanic membrane,  ear canal and external ear normal.     Nose: Nose normal.     Mouth/Throat:     Mouth: Mucous membranes are moist.  Eyes:     Extraocular Movements: Extraocular movements intact.     Pupils: Pupils are equal, round, and reactive to light.  Neck:     Musculoskeletal: Normal range of motion and neck supple.  Cardiovascular:     Rate and Rhythm: Normal rate and regular rhythm.     Heart sounds: Normal heart sounds.   Pulmonary:     Effort: Pulmonary effort is normal.     Breath sounds: Normal breath sounds.  Skin:    General: Skin is warm.  Neurological:     General: No focal deficit present.     Mental Status: She is alert and oriented to person, place, and time.     Cranial Nerves: No cranial nerve deficit.     Motor: No weakness.     Comments: Negative dix halpike bilaterally   Psychiatric:        Mood and Affect: Mood normal.        Behavior: Behavior normal.        Assessment/plan: 1. Dizziness ? If side effect from trazodone vs. Wean off drugs for BPPV. Will have her stop trazodone and see if symptoms improve. Also gave handout on exercises to do at home for BPPV as history could support this. If still having dizziness despite us stopping possible offending medication, decreasing bp meds and having her do exercises for bppv we will order mri of head. F/u in 2 weeks.   2. Essential hypertension Readings are good and some "low." discussed I am fine with decreasing her norvasc down to 5mg . She can just take 1/2 tablet daily and we will f/u in one month to recheck this. I do not think this is contributing to her symptoms. Orthostatics okay.   3. Inattention Discussed we would need to make another appointment for this as it's quite extensive to discuss. I did tell her at 78 years of age I would not prescribe a stimulant as I think the risks would far outweigh the benefits and I would want her tested as well. Would also give a memory test to make sure not a memory/cognitive issue that she is interpretting as inattention. She can f/u for this.     Return in about 2 weeks (around 08/28/2019) for dizziness/bp check .    Orland MustardAllison Shams Fill, MD [facility]   08/14/2019

## 2019-08-14 NOTE — Progress Notes (Addendum)
Orthostatics recorded FYI

## 2019-08-14 NOTE — Telephone Encounter (Signed)
Please call [name] and reiterate with her the following..  1) decrease blood pressure medication to 5mg  (1/2 a pill) continue to keep log  2) mailing her exercises to do for dizziness and want her to stop the trazodone.   I want to see her in 2 weeks. Please have her make appointment.  Thanks!   Aw

## 2019-08-14 NOTE — Patient Instructions (Addendum)
Do the exercises I will send you and if not better in 2 weeks we need to image your head !  Benign Positional Vertigo Vertigo is the feeling that you or your surroundings are moving when they are not. Benign positional vertigo is the most common form of vertigo. This is usually a harmless condition (benign). This condition is positional. This means that symptoms are triggered by certain movements and positions. This condition can be dangerous if it occurs while you are doing something that could cause harm to you or others. This includes activities such as driving or operating machinery. What are the causes? In many cases, the cause of this condition is not known. It may be caused by a disturbance in an area of the inner ear that helps your brain to sense movement and balance. This disturbance can be caused by:  Viral infection (labyrinthitis).  Head injury.  Repetitive motion, such as jumping, dancing, or running. What increases the risk? You are more likely to develop this condition if:  You are a woman.  You are 350 years of age or older. What are the signs or symptoms? Symptoms of this condition usually happen when you move your head or your eyes in different directions. Symptoms may start suddenly, and usually last for less than a minute. They include:  Loss of balance and falling.  Feeling like you are spinning or moving.  Feeling like your surroundings are spinning or moving.  Nausea and vomiting.  Blurred vision.  Dizziness.  Involuntary eye movement (nystagmus). Symptoms can be mild and cause only minor problems, or they can be severe and interfere with daily life. Episodes of benign positional vertigo may return (recur) over time. Symptoms may improve over time. How is this diagnosed? This condition may be diagnosed based on:  Your medical history.  Physical exam of the head, neck, and ears.  Tests, such as: ? MRI. ? CT scan. ? Eye movement tests. Your health  care provider may ask you to change positions quickly while he or she watches you for symptoms of benign positional vertigo, such as nystagmus. Eye movement may be tested with a variety of exams that are designed to evaluate or stimulate vertigo. ? An electroencephalogram (EEG). This records electrical activity in your brain. ? Hearing tests. You may be referred to a health care provider who specializes in ear, nose, and throat (ENT) problems (otolaryngologist) or a provider who specializes in disorders of the nervous system (neurologist). How is this treated?  This condition may be treated in a session in which your health care provider moves your head in specific positions to adjust your inner ear back to normal. Treatment for this condition may take several sessions. Surgery may be needed in severe cases, but this is rare. In some cases, benign positional vertigo may resolve on its own in 2-4 weeks. Follow these instructions at home: Safety  Move slowly. Avoid sudden body or head movements or certain positions, as told by your health care provider.  Avoid driving until your health care provider says it is safe for you to do so.  Avoid operating heavy machinery until your health care provider says it is safe for you to do so.  Avoid doing any tasks that would be dangerous to you or others if vertigo occurs.  If you have trouble walking or keeping your balance, try using a cane for stability. If you feel dizzy or unstable, sit down right away.  Return to your normal activities as  told by your health care provider. Ask your health care provider what activities are safe for you. General instructions  Take over-the-counter and prescription medicines only as told by your health care provider.  Drink enough fluid to keep your urine pale yellow.  Keep all follow-up visits as told by your health care provider. This is important. Contact a health care provider if:  You have a fever.  Your  condition gets worse or you develop new symptoms.  Your family or friends notice any behavioral changes.  You have nausea or vomiting that gets worse.  You have numbness or a "pins and needles" sensation. Get help right away if you:  Have difficulty speaking or moving.  Are always dizzy.  Faint.  Develop severe headaches.  Have weakness in your legs or arms.  Have changes in your hearing or vision.  Develop a stiff neck.  Develop sensitivity to light. Summary  Vertigo is the feeling that you or your surroundings are moving when they are not. Benign positional vertigo is the most common form of vertigo.  The cause of this condition is not known. It may be caused by a disturbance in an area of the inner ear that helps your brain to sense movement and balance.  Symptoms include loss of balance and falling, feeling that you or your surroundings are moving, nausea and vomiting, and blurred vision.  This condition can be diagnosed based on symptoms, physical exam, and other tests, such as MRI, CT scan, eye movement tests, and hearing tests.  Follow safety instructions as told by your health care provider. You will also be told when to contact your health care provider in case of problems. This information is not intended to replace advice given to you by your health care provider. Make sure you discuss any questions you have with your health care provider. Document Released: 09/19/2006 Document Revised: 05/23/2018 Document Reviewed: 05/23/2018 Elsevier Patient Education  2020 [employer].

## 2019-08-14 NOTE — Telephone Encounter (Signed)
Spoke to patient and advised of recommendations per Dr. Rogers Blocker.  She verbalized understanding and appt scheduled for f/u on 9/2.

## 2019-08-15 ENCOUNTER — Ambulatory Visit: Payer: Self-pay | Admitting: *Deleted

## 2019-08-15 NOTE — Telephone Encounter (Signed)
She can take claritin only. NOT the D.  Orma Flaming, MD [place]

## 2019-08-15 NOTE — Telephone Encounter (Signed)
Spoke to patient and advised of recommendations per Dr. Rogers Blocker.  She verbalized understanding.

## 2019-08-15 NOTE — Telephone Encounter (Signed)
I returned pt's call.    The request came in as her wanting to know if she could take Claritin with her BP medication (Amlodipine).  When I returned her call she "thinks she is having allergies and wants to know if it's ok to take Claritin with the dizziness she is having.  (Saw Dr. Rogers Blocker yesterday for the dizziness per pt).    I asked her if she has problems with allergies and she replied,  I'm thinking I might be having allergies". Then she went on to talk about having dry eyes, nose and mouth and a friend told her to take Claritin D and "it would help with the dry eyes and blurry vision she is having".     She jumped around with her information.   I let her know not to take Claritin D until hearing from Dr. Rogers Blocker because that can make her dry eyes, nose and mouth worse.     Her main question is should she take Claritin or Claritin D?    Does she need it? I let her know someone would call her back.  I sent these notes to Dr. Shelby Mattocks office for further disposition.     Reason for Disposition . [1] Caller has NON-URGENT medication question about med that PCP prescribed AND [2] triager unable to answer question    Has dry eyes nose and mouth.    "Thinks it's an allergy".   Wants to know if she can take Claritin with the dizziness she has going on.   (She saw Dr Rogers Blocker yesterday and she made some changes in her medication).  Answer Assessment - Initial Assessment Questions 1.   NAME of MEDICATION: "What medicine are you calling about?"     I am experiencing blurred vision.    Should I take the Claritin?   I've seen Dr. Rogers Blocker for the blurred vision.       2.   QUESTION: "What is your question?"     Can I take Claritin or should I not take it with the problem of blurred vision? 3.   PRESCRIBING HCP: "Who prescribed it?" Reason: if prescribed by specialist, call should be referred to that group.     Dr. Rogers Blocker 4. SYMPTOMS: "Do you have any symptoms?"     Dry eyes and nose and mouth.    I'm  thinking it's an allergy.     I've had dry eyes and mouth for a long time.    When my eyes are dry my vision becomes blurry.  I've tried drops but it has not helped.     5. SEVERITY: If symptoms are present, ask "Are they mild, moderate or severe?"     Moderate  "I'm just thinking I have allergies".     6.  PREGNANCY:  "Is there any chance that you are pregnant?" "When was your last menstrual period?"     N/A due to age   She was told by a friend that taking Claritin D would help her dry eyes and mouth.   I let her know that actually the antihistamines and ones with the decongestants in them can dry her eyes and mouth.  "I won't take any of that".   "I don't want my eyes and mouth to be more dry than what they are now".  I let her know I am sending this note to Dr. Rogers Blocker to get her input on your situation and someone will call you back.  She mentioned she  saw Dr. Artis FlockWolfe yesterday for dizziness.  Protocols used: MEDICATION QUESTION CALL-A-AH

## 2019-08-23 NOTE — Progress Notes (Signed)
Office Visit Note  Patient: Christine Scott             Date of Birth: 1941-07-23           MRN: [id number]             PCP: Orland Mustard, MD Referring: Orland Mustard, MD Visit Date: 08/26/2019 Occupation: @GUAROCC @  Subjective:  Pain in multiple joints.   History of Present Illness: Christine Scott is a 78 y.o. female with history of PMR, Sjogren's and osteoarthritis.  She states she has been having recurrent discomfort in her left trochanteric bursa for which she has been seeing a sports medicine doctor now.  She also continues to have some discomfort in her neck and shoulders.  She states the dorsum of her bilateral feet has been painful.  The lower back is not as painful.  For the treatment of osteoporosis she is on Prolia injections by her PCP.  Activities of Daily Living:  Patient reports morning stiffness for 0 minutes.   Patient Reports nocturnal pain.  Difficulty dressing/grooming: Denies Difficulty climbing stairs: Denies Difficulty getting out of chair: Denies Difficulty using hands for taps, buttons, cutlery, and/or writing: Denies  Review of Systems  Constitutional: Negative for fatigue.  HENT: Positive for mouth dryness and nose dryness. Negative for mouth sores.   Eyes: Positive for dryness. Negative for pain and itching.  Respiratory: Negative for shortness of breath, wheezing and difficulty breathing.   Cardiovascular: Negative for chest pain and palpitations.  Gastrointestinal: Negative for blood in stool, constipation and diarrhea.  Endocrine: Negative for increased urination.  Genitourinary: Negative for difficulty urinating and painful urination.  Musculoskeletal: Positive for arthralgias and joint pain. Negative for joint swelling and morning stiffness.  Skin: Negative for rash and redness.  Allergic/Immunologic: Negative for susceptible to infections.  Neurological: Positive for dizziness and memory loss. Negative for headaches and weakness.  Hematological:  Negative for bruising/bleeding tendency.  Psychiatric/Behavioral: Positive for confusion. Negative for sleep disturbance.    PMFS History:  Patient Active Problem List   Diagnosis Date Noted  . Greater trochanteric bursitis of left hip 07/04/2019  . DDD (degenerative disc disease), lumbar 02/25/2019  . Bipolar II disorder (HCC) 12/07/2018  . 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 History of depression 08/12/2017  . Essential hypertension 08/12/2017  . Lumbar radiculopathy 06/15/2017  . Sjogren's syndrome 12/25/2016  . 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 Primary osteoarthritis of both feet 12/25/2016  . Osteoporosis 12/25/2016    Past Medical History:  Diagnosis Date  . Abnormal Pap smear of cervix    --hx abnormal paps in her 30s and per patient this was reason for her Hysterectomy  . Depression   . Dry eye   . Hypertension   . Osteoarthritis    hands  . Osteoporosis   . Polymyalgia rheumatica (HCC)     Family History  Problem Relation Age of Onset  . Congestive Heart Failure Mother   . Stroke Mother   . Diabetes Father   . Colon cancer Brother   . Diabetes Brother   . Diabetes Brother   . Stroke Brother   . Colon cancer Daughter    Past Surgical History:  Procedure Laterality Date  . BREAST SURGERY  2016   benign mass removed in [place], [place].  Marland Kitchen FACIAL COSMETIC SURGERY    . TOTAL VAGINAL HYSTERECTOMY  age 12   --due to abnormal pap smears per  patient   Social History   Social History Narrative  . Not on file   Immunization History  Administered Date(s) Administered  . Influenza, High Dose Seasonal PF 10/21/2017, 11/27/2018  . Influenza-Unspecified 10/13/2016  . Pneumococcal Conjugate-13 08/17/2018  . Pneumococcal Polysaccharide-23 01/19/2016  . Tdap 12/26/2009     Objective: Vital Signs: BP 136/72 (BP Location: Left Arm, Patient Position: Sitting, Cuff Size: Normal)   Pulse 67    Resp 14   Ht 5' (1.524 m)   Wt 120 lb (54.4 kg)   LMP  (LMP Unknown)   BMI 23.44 kg/m    Physical Exam Vitals signs and nursing note reviewed.  Constitutional:      Appearance: She is well-developed.  HENT:     Head: Normocephalic and atraumatic.  Eyes:     Conjunctiva/sclera: Conjunctivae normal.  Neck:     Musculoskeletal: Normal range of motion.  Cardiovascular:     Rate and Rhythm: Normal rate and regular rhythm.     Heart sounds: Normal heart sounds.  Pulmonary:     Effort: Pulmonary effort is normal.     Breath sounds: Normal breath sounds.  Abdominal:     General: Bowel sounds are normal.     Palpations: Abdomen is soft.  Lymphadenopathy:     Cervical: No cervical adenopathy.  Skin:    General: Skin is warm and dry.     Capillary Refill: Capillary refill takes less than 2 seconds.  Neurological:     Mental Status: She is alert and oriented to person, place, and time.  Psychiatric:        Behavior: Behavior normal.      Musculoskeletal Exam: C-spine was in good range of motion.  She had trapezius spasm.  Shoulder joints, elbow joints with good range of motion.  She has severe PIP and DIP narrowing and some subluxation of the joints.  Hip joints and knee joints in good range of motion.  She has bilateral dorsal spurs.  CDAI Exam: CDAI Score: - Patient Global: -; Provider Global: - Swollen: -; Tender: - Joint Exam   No joint exam has been documented for this visit   There is currently no information documented on the homunculus. Go to the Rheumatology activity and complete the homunculus joint exam.  Investigation: No additional findings.  Imaging: No results found.  Recent Labs: Lab Results  Component Value Date   WBC 6.8 02/25/2019   HGB 11.1 (L) 02/25/2019   PLT 220 02/25/2019   NA 136 05/13/2019   K 4.1 05/13/2019   CL 99 05/13/2019   CO2 30 05/13/2019   GLUCOSE 78 05/13/2019   BUN 17 05/13/2019   CREATININE 0.83 05/13/2019   BILITOT 0.4  05/13/2019   ALKPHOS 46 05/13/2019   AST 19 05/13/2019   ALT 10 05/13/2019   PROT 6.4 05/13/2019   ALBUMIN 4.0 05/13/2019   CALCIUM 9.2 05/13/2019   GFRAA 63 02/25/2019    Speciality Comments: Osteoporosis managed by PCP.  Procedures:  No procedures performed Allergies: Patient has no known allergies.   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  She had no muscular weakness or tenderness on examination.  She has been off prednisone for a while.  Sjogren's syndrome-over-the-counter products were discussed.  Primary osteoarthritis of both hands-she has severe osteoarthritis in her hands which causes discomfort.  Joint protection was discussed.  Primary osteoarthritis of both feet-she has also spurs and osteoarthritic changes.  Proper  fitting shoes with arch support were discussed.  Trochanteric bursitis, left hip-she has been getting injections by sports medicine doctors.  DDD (degenerative disc disease), lumbar-she is currently not having much discomfort.  Osteoporosis - She has a history of osteoporosis that has improved on Prolia.  She has been on prolia for 3-4 years.  Her osteoporosis has been managed by her PCP.   History of hypertension-blood pressure is under control.  History of depression  Orders: No orders of the defined types were placed in this encounter.  No orders of the defined types were placed in this encounter.    Follow-Up Instructions: Return in about 6 months (around 02/23/2020) for Osteoarthritis, Polymyalgia rheumatica, Osteoporosis.   Bo Merino, MD  Note - This record has been created using Editor, commissioning.  Chart creation errors have been sought, but may not always  have been located. Such creation errors do not reflect on  the standard of medical care.

## 2019-08-26 ENCOUNTER — Other Ambulatory Visit: Payer: Self-pay

## 2019-08-26 ENCOUNTER — Ambulatory Visit (INDEPENDENT_AMBULATORY_CARE_PROVIDER_SITE_OTHER): Payer: Federal, State, Local not specified - PPO | Admitting: Rheumatology

## 2019-08-26 ENCOUNTER — Encounter: Payer: Self-pay | Admitting: Rheumatology

## 2019-08-26 VITALS — BP 136/72 | HR 67 | Resp 14 | Ht 60.0 in | Wt 120.0 lb

## 2019-08-26 DIAGNOSIS — Z8679 Personal history of other diseases of the circulatory system: Secondary | ICD-10-CM

## 2019-08-26 DIAGNOSIS — M353 Polymyalgia rheumatica: Secondary | ICD-10-CM | POA: Diagnosis not present

## 2019-08-26 DIAGNOSIS — M5136 Other intervertebral disc degeneration, lumbar region: Secondary | ICD-10-CM

## 2019-08-26 DIAGNOSIS — M19041 Primary osteoarthritis, right hand: Secondary | ICD-10-CM

## 2019-08-26 DIAGNOSIS — Z8659 Personal history of other mental and behavioral disorders: Secondary | ICD-10-CM

## 2019-08-26 DIAGNOSIS — M7062 Trochanteric bursitis, left hip: Secondary | ICD-10-CM

## 2019-08-26 DIAGNOSIS — M19071 Primary osteoarthritis, right ankle and foot: Secondary | ICD-10-CM

## 2019-08-26 DIAGNOSIS — M19042 Primary osteoarthritis, left hand: Secondary | ICD-10-CM

## 2019-08-26 DIAGNOSIS — M51369 Other intervertebral disc degeneration, lumbar region without mention of lumbar back pain or lower extremity pain: Secondary | ICD-10-CM

## 2019-08-26 DIAGNOSIS — M810 Age-related osteoporosis without current pathological fracture: Secondary | ICD-10-CM

## 2019-08-26 DIAGNOSIS — M3501 Sicca syndrome with keratoconjunctivitis: Secondary | ICD-10-CM

## 2019-08-26 DIAGNOSIS — M19072 Primary osteoarthritis, left ankle and foot: Secondary | ICD-10-CM

## 2019-08-27 ENCOUNTER — Ambulatory Visit: Payer: Federal, State, Local not specified - PPO | Admitting: Psychiatry

## 2019-08-27 ENCOUNTER — Telehealth: Payer: Self-pay | Admitting: Rheumatology

## 2019-08-27 NOTE — Telephone Encounter (Signed)
Patient left a voicemail stating in order for her to attend the [employer] she needs a prescription from Dr. Estanislado Pandy stating she has osteoarthritis.  Patient requested a return call.

## 2019-08-27 NOTE — Telephone Encounter (Signed)
Ok to provide prescription?

## 2019-08-28 ENCOUNTER — Other Ambulatory Visit: Payer: Self-pay

## 2019-08-28 ENCOUNTER — Ambulatory Visit (INDEPENDENT_AMBULATORY_CARE_PROVIDER_SITE_OTHER): Payer: Federal, State, Local not specified - PPO | Admitting: Family Medicine

## 2019-08-28 ENCOUNTER — Encounter: Payer: Self-pay | Admitting: Family Medicine

## 2019-08-28 ENCOUNTER — Ambulatory Visit: Payer: Self-pay | Admitting: Rheumatology

## 2019-08-28 VITALS — BP 138/76 | HR 74 | Temp 98.1°F | Ht 60.0 in | Wt 117.8 lb

## 2019-08-28 DIAGNOSIS — R413 Other amnesia: Secondary | ICD-10-CM

## 2019-08-28 DIAGNOSIS — R42 Dizziness and giddiness: Secondary | ICD-10-CM | POA: Diagnosis not present

## 2019-08-28 DIAGNOSIS — I10 Essential (primary) hypertension: Secondary | ICD-10-CM | POA: Diagnosis not present

## 2019-08-28 DIAGNOSIS — F3181 Bipolar II disorder: Secondary | ICD-10-CM

## 2019-08-28 DIAGNOSIS — Z23 Encounter for immunization: Secondary | ICD-10-CM

## 2019-08-28 LAB — CBC WITH DIFFERENTIAL/PLATELET
Basophils Absolute: 0 10*3/uL (ref 0.0–0.1)
Basophils Relative: 0.1 % (ref 0.0–3.0)
Eosinophils Absolute: 0 10*3/uL (ref 0.0–0.7)
Eosinophils Relative: 0.2 % (ref 0.0–5.0)
HCT: 37 % (ref 36.0–46.0)
Hemoglobin: 12.3 g/dL (ref 12.0–15.0)
Lymphocytes Relative: 14.8 % (ref 12.0–46.0)
Lymphs Abs: 1 10*3/uL (ref 0.7–4.0)
MCHC: 33.2 g/dL (ref 30.0–36.0)
MCV: 91.8 fl (ref 78.0–100.0)
Monocytes Absolute: 0.5 10*3/uL (ref 0.1–1.0)
Monocytes Relative: 6.8 % (ref 3.0–12.0)
Neutro Abs: 5.4 10*3/uL (ref 1.4–7.7)
Neutrophils Relative %: 78.1 % — ABNORMAL HIGH (ref 43.0–77.0)
Platelets: 214 10*3/uL (ref 150.0–400.0)
RBC: 4.03 Mil/uL (ref 3.87–5.11)
RDW: 14.1 % (ref 11.5–15.5)
WBC: 6.9 10*3/uL (ref 4.0–10.5)

## 2019-08-28 LAB — URINALYSIS, ROUTINE W REFLEX MICROSCOPIC
Bilirubin Urine: NEGATIVE
Hgb urine dipstick: NEGATIVE
Ketones, ur: NEGATIVE
Leukocytes,Ua: NEGATIVE
Nitrite: NEGATIVE
Specific Gravity, Urine: 1.01 (ref 1.000–1.030)
Total Protein, Urine: NEGATIVE
Urine Glucose: NEGATIVE
Urobilinogen, UA: 0.2 (ref 0.0–1.0)
pH: 8 (ref 5.0–8.0)

## 2019-08-28 LAB — COMPREHENSIVE METABOLIC PANEL
ALT: 14 U/L (ref 0–35)
AST: 22 U/L (ref 0–37)
Albumin: 4.4 g/dL (ref 3.5–5.2)
Alkaline Phosphatase: 51 U/L (ref 39–117)
BUN: 15 mg/dL (ref 6–23)
CO2: 31 mEq/L (ref 19–32)
Calcium: 10.6 mg/dL — ABNORMAL HIGH (ref 8.4–10.5)
Chloride: 97 mEq/L (ref 96–112)
Creatinine, Ser: 0.74 mg/dL (ref 0.40–1.20)
GFR: 75.64 mL/min (ref >60.00)
Glucose, Bld: 82 mg/dL (ref 70–99)
Potassium: 4.1 mEq/L (ref 3.5–5.1)
Sodium: 138 mEq/L (ref 135–145)
Total Bilirubin: 0.6 mg/dL (ref 0.2–1.2)
Total Protein: 7.5 g/dL (ref 6.0–8.3)

## 2019-08-28 LAB — VITAMIN B12: Vitamin B-12: 752 pg/mL (ref 211–911)

## 2019-08-28 LAB — C-REACTIVE PROTEIN: CRP: <1 mg/dL (ref 0.5–20.0)

## 2019-08-28 LAB — TSH: TSH: 1.66 u[IU]/mL (ref 0.35–4.50)

## 2019-08-28 NOTE — Patient Instructions (Signed)
I think your memory is okay. Sending to psychiatry for bipolar treatment vs. Add.  Keep your blood pressure medication at 5mg .    Mild Neurocognitive Disorder Mild neurocognitive disorder (formerly known as mild cognitive impairment) is a disorder in which memory does not work as well as it should. This disorder may also affect other mental functions, including thought, communication, behavior, and completion of tasks. These impairments are noticeable and measurable, but for the most part they do not interfere with daily activities or the ability to live independently. Mild neurocognitive disorder typically occurs in people older than 60 years but can occur earlier. It is not as serious as major neurocognitive disorder (formerly known as dementia), but it may be the first sign of it. Generally, symptoms of this condition get worse over time. In rare cases, symptoms can get better. What are the causes? This condition may be caused by:  Brain disorders associated with abnormal protein deposits in the brain, such as: ? Alzheimer disease. ? Frontotemporal dementia. ? Dementia with Lewy bodies.  Brain disorders associated with abnormal movement, such as Parkinson disease or Huntington disease.  Diseases that affect blood vessels in the brain and result in small strokes.  Certain infections, such as HIV.  Traumatic brain injury.  Other medical conditions, such as brain tumors, under-active thyroid (hypothyroidism), and vitamin B12 deficiency.  Use of certain drugs or prescription medicines.  Untreated sleep apnea.  Heart disease, lung disease, liver disease, or kidney disease. What are the signs or symptoms? Symptoms of this condition include:  Difficulty remembering. You may: ? Forget details of recent events, names, or phone numbers. ? Forget social events and appointments. ? Repeatedly forget where you put your car keys or other items.  Difficulty thinking and solving problems. You  may have trouble with complex tasks, such as: ? Paying bills. ? Driving in unfamiliar places.  Difficulty communicating. You may have trouble: ? Finding the right word or naming an object. ? Forming a sentence that makes sense, or understanding what you read or hear.  Changes in your behavior or personality. When this happens, you may: ? Lose interest in the things that you used to enjoy. ? Withdraw from social situations. ? Get angry more easily than usual. ? Act before thinking. ? Do things in public that you would not usually do. How is this diagnosed? This condition is diagnosed based on:  Your symptoms. Your health care provider may ask you as well as people you spend time with, such as family and friends, about your symptoms. Questions may include: ? How often symptoms occur. ? How long they have been occurring. ? Whether they are getting worse. ? The effect they are having on your life.  Evaluation of mental functions (neuropsychological testing). Your health care provider may refer you to a neurologist or mental health specialist for a detailed evaluation of your mental functions. To identify the cause of your mild neurocognitive disorder, your health care provider may:  Get a detailed medical history.  Ask about alcohol and drug use, including prescription medicines.  Perform a physical exam.  Order blood tests and brain imaging exams. How is this treated? If mild neurocognitive disorder is caused by medicine, drug use, infection, or another medical condition, it may improve when the cause is treated, or when medicines or drugs are stopped. Mild neurocognitive disorder resulting from other causes generally does not improve and may worsen. In these cases, the goal of treatment is to help you cope with  the loss of mental function. Treatments in these cases include:  Medicine. Medicine helps mainly with memory loss and behavioral symptoms.  Talk therapy. Talk therapy  provides education, emotional support, memory aids, and other ways of making up for impairments in mental function.  Lifestyle changes, including: ? Regular exercise. ? A healthy diet that includes omega-3 fatty acids. ? Intellectual stimulation. ? Increased social interaction. Follow these instructions at home: Lifestyle   Exercise regularly as told by your health care provider.  Do not use any products that contain nicotine or tobacco, such as cigarettes and e-cigarettes. If you need help quitting, ask your health care provider.  Practice stress-management techniques when you get stressed. If you need help managing stress, ask your health care provider.  Stay social.  Keep your mind active with stimulating activities you enjoy, such as reading or playing games.  Make sure to get quality sleep. These tips can help you to get a good night's rest: ? Avoid napping during the day. ? Keep your sleeping area dark and cool. ? Avoid exercising during the few hours before you go to bed. ? Avoid caffeine products in the evening. Eating and drinking  Drink enough fluid to keep your urine clear or pale yellow.  Eat a healthy diet that includes omega-3 fatty acids. These can be found in: ? Fish. ? Nuts. ? Leafy vegetables. ? Vegetable oils. General instructions  Take over-the-counter and prescription medicines only as told by your health care provider. Your health care provider may recommend that you avoid taking medicines that can affect thinking, such as pain or sleeping medicines.  Work with your health care provider to determine what you need help with and what your safety needs are.  Keep all follow-up visits as told by your health care provider. This is important. Contact a health care provider if:  You have any new symptoms. Get help right away if:  You develop new or worsening confusion.  You have behavioral outbursts that place you or your family in danger. Summary   Mild neurocognitive disorder is a disorder in which memory does not work as well as it should. For the most part, this condition does not interfere with a person's daily activities or ability to live independently.  Mild neurocognitive disorder can have many causes and may be the first stage of Alzheimer disease or other types of dementia.  Exercise, healthy diet, getting quality sleep, and keeping your mind active are very important for brain health. This information is not intended to replace advice given to you by your health care provider. Make sure you discuss any questions you have with your health care provider. Document Released: 08/14/2013 Document Revised: 11/24/2017 Document Reviewed: 02/15/2017 Elsevier Patient Education  2020 [employer]

## 2019-08-28 NOTE — Telephone Encounter (Signed)
Attempted to contact the patient and left message for patient to call the office.  

## 2019-08-28 NOTE — Telephone Encounter (Signed)
Patient states she saw her PCP today and she wrote the prescription for her.

## 2019-08-28 NOTE — Progress Notes (Signed)
Patient: Christine Scott MRN: [id number] DOB: 11/01/1941 PCP: Orland MustardWolfe, Betzy Barbier, MD      Subjective:  Chief Complaint  Patient presents with  . Dizziness    2 wk follow up    HPI: The patient is a 78 y.o. female who presents today for dizziness follow up and blood pressure check.   Dizziness: resolved with stopping trazodone and decreasing bp medication.   Hypertension: we decreased her medication down to 5mg . Her log is pretty consistent with 120/80. She had a few outlying to 150/80, but overall to goal.   Memory loss: she feels like this has gradually gotten worse over the last several years. Daughter tells her she is not listening, but she is and forgets. She feels like she may have ADD. She has not gotten lost driving. Sometimes she has to think about where she parked, but has never forgot where she parked. She has forgotten about eggs boiling on stove. She does lose the keys if she doesn't hang by the door. Other things she will put money somewhere and then she can't find it. She sometimes will have conservations and she doesn't remember having it, but states then it comes back. It takes a while. Her friend that she is close with doesn't she has an issue and relates her memory more to medication. She sometimes can't remember how to spell the most simple words and this frustrates her.   Review of Systems  Constitutional: Negative for chills, fatigue and fever.  HENT: Negative for dental problem, ear pain, hearing loss and trouble swallowing.   Eyes: Negative for visual disturbance.  Respiratory: Negative for cough, chest tightness and shortness of breath.   Cardiovascular: Negative for chest pain, palpitations and leg swelling.  Gastrointestinal: Negative for abdominal pain, blood in stool, diarrhea and nausea.  Endocrine: Negative for cold intolerance, polydipsia, polyphagia and polyuria.  Genitourinary: Negative for dysuria and hematuria.  Musculoskeletal: Negative for arthralgias.   Skin: Negative for rash.  Neurological: Negative for dizziness, tremors, syncope, facial asymmetry, weakness, numbness and headaches.  Psychiatric/Behavioral: Negative for agitation, confusion, dysphoric mood, sleep disturbance and suicidal ideas. The patient is not nervous/anxious.     Allergies Patient has No Known Allergies.  Past Medical History Patient  has a past medical history of Abnormal Pap smear of cervix, Depression, Dry eye, Hypertension, Osteoarthritis, Osteoporosis, and Polymyalgia rheumatica (HCC).  Surgical History Patient  has a past surgical history that includes Facial cosmetic surgery; Total vaginal hysterectomy (age 78); and Breast surgery (2016).  Family History Pateint's family history includes Colon cancer in her brother and daughter; Congestive Heart Failure in her mother; Diabetes in her brother, brother, and father; Stroke in her brother and mother.  Social History Patient  reports that she has never smoked. She has never used smokeless tobacco. She reports current alcohol use of about 1.0 standard drinks of alcohol per week. She reports that she does not use drugs.    Objective: Vitals:   08/28/19 1120  BP: 138/76  Pulse: 74  Temp: 98.1 F (36.7 C)  TempSrc: Skin  SpO2: 98%  Weight: 117 lb 12.8 oz (53.4 kg)  Height: 5' (1.524 m)    Body mass index is 23.01 kg/m.  Physical Exam Vitals signs reviewed.  Constitutional:      Appearance: Normal appearance. She is normal weight.  HENT:     Head: Normocephalic and atraumatic.     Right Ear: Tympanic membrane, ear canal and external ear normal.     Left Ear:  Tympanic membrane, ear canal and external ear normal.     Nose: Nose normal.     Mouth/Throat:     Mouth: Mucous membranes are moist.  Eyes:     Extraocular Movements: Extraocular movements intact.     Conjunctiva/sclera: Conjunctivae normal.     Pupils: Pupils are equal, round, and reactive to light.  Neck:     Musculoskeletal: Normal  range of motion and neck supple.     Vascular: No carotid bruit.  Cardiovascular:     Rate and Rhythm: Normal rate and regular rhythm.  Pulmonary:     Effort: Pulmonary effort is normal.     Breath sounds: Normal breath sounds.  Abdominal:     General: Abdomen is flat. Bowel sounds are normal.     Palpations: Abdomen is soft.  Lymphadenopathy:     Cervical: No cervical adenopathy.  Skin:    General: Skin is warm.  Neurological:     General: No focal deficit present.     Mental Status: She is alert and oriented to person, place, and time.     Cranial Nerves: No cranial nerve deficit.     Motor: No weakness.     Gait: Gait normal.     Deep Tendon Reflexes: Reflexes normal.  Psychiatric:        Mood and Affect: Mood normal.        Behavior: Behavior normal.    MMSE: 30/30    Assessment/plan: 1. Memory loss She has a perfect MMSE score. 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 She does have bipolar and I wonder if this vs. ADD is what is causing the perception of memory loss. We are going to refer her to a new psychiatrist. I discussed no signs of dementia at this time and a good starting place would be psychiatry to see about bipolar vs. ADD. I don't think benefits outweigh risks of starting a stimulant in a 78 year old female, but she can discuss with psychiatrist. Reassurance given for memory loss not presenting like dementia. Also checking labs for memory loss.  If she feels like it's getting worse after seeing psychiatry we can always check mri head and refer to neuro, but I do not feel like this is warranted at this time with normal MMSE score and clinical presentation not really impressive for memory loss.   2. Essential hypertension To goal. Continue decreased norvasc at 5mg  and keep a log. She is very concerned about a stroke, so discussed goal is <140/90. She is to let me know if readings start to be above this. F/u in 6 months.   3. Dizziness Resolved. Likely secondary to trazodone and  possible BP.   4. Need for immunization against influenza  - Flu Vaccine QUAD High Dose(Fluad)  5. Bipolar -referral to new psychiatrist. See plan #1.   >40 minutes spent in face to face counseling/testing and care.   Return in about 6 months (around 02/25/2020) for htn.   Orma Flaming, MD [facility]   08/28/2019

## 2019-08-30 LAB — URINE CULTURE
MICRO NUMBER:: 840565
Result:: NO GROWTH
SPECIMEN QUALITY:: ADEQUATE

## 2019-09-12 ENCOUNTER — Other Ambulatory Visit: Payer: Self-pay

## 2019-09-12 ENCOUNTER — Ambulatory Visit (INDEPENDENT_AMBULATORY_CARE_PROVIDER_SITE_OTHER): Payer: Federal, State, Local not specified - PPO

## 2019-09-12 DIAGNOSIS — M810 Age-related osteoporosis without current pathological fracture: Secondary | ICD-10-CM | POA: Diagnosis not present

## 2019-09-12 MED ORDER — DENOSUMAB 60 MG/ML ~~LOC~~ SOSY
60.0000 mg | PREFILLED_SYRINGE | Freq: Once | SUBCUTANEOUS | Status: AC
  Administered 2019-09-12: 60 mg via SUBCUTANEOUS

## 2019-09-12 NOTE — Progress Notes (Signed)
Per orders of Dr. Jerline Pain, injection of Prolia given left arm SQ by Clearnce Sorrel Zellmer, CMA Patient tolerated injection well. She will return in 6 months for her next injection.

## 2019-09-13 ENCOUNTER — Ambulatory Visit: Payer: Federal, State, Local not specified - PPO | Admitting: Family Medicine

## 2019-11-04 ENCOUNTER — Other Ambulatory Visit: Payer: Self-pay | Admitting: *Deleted

## 2019-11-04 DIAGNOSIS — M8589 Other specified disorders of bone density and structure, multiple sites: Secondary | ICD-10-CM

## 2019-11-13 ENCOUNTER — Ambulatory Visit: Payer: Federal, State, Local not specified - PPO | Admitting: Family Medicine

## 2019-11-27 ENCOUNTER — Encounter: Payer: Self-pay | Admitting: Rheumatology

## 2019-12-03 ENCOUNTER — Telehealth: Payer: Self-pay

## 2019-12-03 NOTE — Telephone Encounter (Signed)
DEXA 11/27/2019  T-score: -2.2 BMD: 0.600  Patient has been on prolia 3-4 years, managed by PCP.   Per Dr. Estanislado Pandy, DEXA is stable, no change in treatment.   Advised patient of results, she verbalized understanding.

## 2020-01-12 ENCOUNTER — Ambulatory Visit: Payer: Federal, State, Local not specified - PPO | Attending: Internal Medicine

## 2020-01-12 DIAGNOSIS — Z23 Encounter for immunization: Secondary | ICD-10-CM | POA: Insufficient documentation

## 2020-01-12 NOTE — Progress Notes (Signed)
   Covid-19 Vaccination Clinic  Name:  Christine Scott    MRN: [id number] DOB: [birth year]  01/12/2020  [name] was observed post Covid-19 immunization for 15 minutes without incidence. She was provided with Vaccine Information Sheet and instruction to access the V-Safe system.   [name] was instructed to call 911 with any severe reactions post vaccine: Marland Kitchen Difficulty breathing  . Swelling of your face and throat  . A fast heartbeat  . A bad rash all over your body  . Dizziness and weakness

## 2020-01-30 ENCOUNTER — Ambulatory Visit: Payer: Self-pay

## 2020-02-05 ENCOUNTER — Ambulatory Visit: Payer: Federal, State, Local not specified - PPO | Attending: Internal Medicine

## 2020-02-05 DIAGNOSIS — Z23 Encounter for immunization: Secondary | ICD-10-CM

## 2020-02-05 NOTE — Progress Notes (Signed)
   Covid-19 Vaccination Clinic  Name:  Christine Scott    MRN: [id number] DOB: [birth year]  02/05/2020  [name] was observed post Covid-19 immunization for 15 minutes without incidence. She was provided with Vaccine Information Sheet and instruction to access the V-Safe system.   [name] was instructed to call 911 with any severe reactions post vaccine: Marland Kitchen Difficulty breathing  . Swelling of your face and throat  . A fast heartbeat  . A bad rash all over your body  . Dizziness and weakness    Immunizations Administered    Name Date Dose VIS Date Route   Pfizer COVID-19 Vaccine 02/05/2020  3:34 PM 0.3 mL 12/06/2019 Intramuscular   Manufacturer: [employer], [employer]   Lot: [id number]   NDC: [address]-5

## 2020-02-09 ENCOUNTER — Other Ambulatory Visit: Payer: Self-pay | Admitting: Family Medicine

## 2020-02-19 NOTE — Progress Notes (Signed)
Office Visit Note  Patient: Christine Scott             Date of Birth: 1941-12-21           MRN: [id number]             PCP: Orma Flaming, MD Referring: Orma Flaming, MD Visit Date: 02/25/2020 Occupation: @GUAROCC @  Subjective:  Osteoporosis (Doing good, bil feet pain, left hip pain, back pain)   History of Present Illness: Christine Scott is a 79 y.o. female with history of osteoarthritis degenerative disc disease and Sjogren's.  She states she has been experiencing pain and burning sensation in her bilateral feet recently.  She also has discomfort over left trochanteric bursa which is keeping her up at night.  She has been experiencing discomfort in her lower back as well.  There is no history of radiculopathy.  She has been having some discomfort in her shoulders as well.  There is no history of muscle weakness.  She continues to have dry mouth and dry eyes.  She has been using over-the-counter products which has been helpful.  Activities of Daily Living:  Patient reports morning stiffness for 15 minutes.   Patient Reports nocturnal pain.  Difficulty dressing/grooming: Denies Difficulty climbing stairs: Reports Difficulty getting out of chair: Reports Difficulty using hands for taps, buttons, cutlery, and/or writing: Denies  Review of Systems  Constitutional: Positive for fatigue. Negative for night sweats, weight gain and weight loss.  HENT: Positive for mouth dryness. Negative for mouth sores, trouble swallowing, trouble swallowing and nose dryness.   Eyes: Positive for dryness. Negative for pain, redness and visual disturbance.  Respiratory: Negative for cough, shortness of breath and difficulty breathing.   Cardiovascular: Negative for chest pain, palpitations, hypertension, irregular heartbeat and swelling in legs/feet.  Gastrointestinal: Negative for blood in stool, constipation and diarrhea.  Endocrine: Negative for excessive thirst and increased urination.  Genitourinary:  Negative for difficulty urinating and vaginal dryness.  Musculoskeletal: Positive for arthralgias, gait problem, joint pain and morning stiffness. Negative for joint swelling, myalgias, muscle weakness, muscle tenderness and myalgias.  Skin: Negative for color change, rash, hair loss, skin tightness, ulcers and sensitivity to sunlight.  Allergic/Immunologic: Negative for susceptible to infections.  Neurological: Negative for dizziness, numbness, memory loss, night sweats and weakness.  Hematological: Negative for bruising/bleeding tendency and swollen glands.  Psychiatric/Behavioral: Positive for sleep disturbance. Negative for depressed mood. The patient is not nervous/anxious.     PMFS History:  Patient Active Problem List   Diagnosis Date Noted  . Greater trochanteric bursitis of left hip 07/04/2019  . DDD (degenerative disc disease), lumbar 02/25/2019  . Bipolar II disorder ([place]) 12/07/2018  . 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 History of depression 08/12/2017  . Essential hypertension 08/12/2017  . Lumbar radiculopathy 06/15/2017  . Sjogren's syndrome 12/25/2016  . Polymyalgia rheumatica ([place]) 12/25/2016  . 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 Primary osteoarthritis of both feet 12/25/2016  . Osteoporosis 12/25/2016    Past Medical History:  Diagnosis Date  . Abnormal Pap smear of cervix    --hx abnormal paps in her 30s and per patient this was reason for her Hysterectomy  . Depression   . Dry eye   . Hypertension   . Osteoarthritis    hands  . Osteoporosis   . Polymyalgia rheumatica (HCC)     Family History  Problem Relation Age of Onset  . Congestive Heart Failure Mother   . Stroke Mother   .  Diabetes Father   . Colon cancer Brother   . Diabetes Brother   . Diabetes Brother   . Stroke Brother   . Colon cancer Daughter    Past Surgical History:  Procedure Laterality Date  . BREAST SURGERY  2016   benign mass removed in [place],  [place].  Marland Kitchen FACIAL COSMETIC SURGERY    . TOTAL VAGINAL HYSTERECTOMY  age 70   --due to abnormal pap smears per patient   Social History   Social History Narrative  . Not on file   Immunization History  Administered Date(s) Administered  . Fluad Quad(high Dose 65+) 08/28/2019  . Influenza, High Dose Seasonal PF 10/21/2017, 11/27/2018  . Influenza-Unspecified 10/13/2016  . PFIZER SARS-COV-2 Vaccination 01/12/2020, 02/05/2020  . Pneumococcal Conjugate-13 08/17/2018  . Pneumococcal Polysaccharide-23 01/19/2016  . Tdap 12/26/2009     Objective: Vital Signs: BP 122/72 (BP Location: Left Arm, Patient Position: Sitting, Cuff Size: Normal)   Pulse 79   Resp 16   Ht 5\' 1"  (1.549 m)   Wt 124 lb 12.8 oz (56.6 kg)   LMP  (LMP Unknown)   BMI 23.58 kg/m    Physical Exam Vitals and nursing note reviewed.  Constitutional:      Appearance: She is well-developed.  HENT:     Head: Normocephalic and atraumatic.  Eyes:     Conjunctiva/sclera: Conjunctivae normal.  Cardiovascular:     Rate and Rhythm: Normal rate and regular rhythm.     Heart sounds: Normal heart sounds.  Pulmonary:     Effort: Pulmonary effort is normal.     Breath sounds: Normal breath sounds.  Abdominal:     General: Bowel sounds are normal.     Palpations: Abdomen is soft.  Musculoskeletal:     Cervical back: Normal range of motion.  Lymphadenopathy:     Cervical: No cervical adenopathy.  Skin:    General: Skin is warm and dry.     Capillary Refill: Capillary refill takes less than 2 seconds.  Neurological:     Mental Status: She is alert and oriented to person, place, and time.  Psychiatric:        Behavior: Behavior normal.      Musculoskeletal Exam: C-spine was in good range of motion.  She has thoracolumbar scoliosis.  Shoulder joints had good range of motion but she had discomfort in her left shoulder joint due to prior surgery.  Elbow joints and wrist joints with good range of motion.  She has PIP and DIP  thickening bilaterally consistent with osteoarthritis.  Hip joints are in good range of motion.  She has tenderness over left trochanteric bursa consistent with trochanteric bursitis.  Knee joints with good range of motion without any warmth swelling or effusion.  She has bilateral DIP and PIP thickening and some tenderness across midfoot.  No synovitis was noted.  CDAI Exam: CDAI Score: -- Patient Global: --; Provider Global: -- Swollen: --; Tender: -- Joint Exam 02/25/2020   No joint exam has been documented for this visit   There is currently no information documented on the homunculus. Go to the Rheumatology activity and complete the homunculus joint exam.  Investigation: No additional findings.  Imaging: No results found.  Recent Labs: Lab Results  Component Value Date   WBC 6.9 08/28/2019   HGB 12.3 08/28/2019   PLT 214.0 08/28/2019   NA 138 08/28/2019   K 4.1 08/28/2019   CL 97 08/28/2019   CO2 31 08/28/2019   GLUCOSE 82 08/28/2019  BUN 15 08/28/2019   CREATININE 0.74 08/28/2019   BILITOT 0.6 08/28/2019   ALKPHOS 51 08/28/2019   AST 22 08/28/2019   ALT 14 08/28/2019   PROT 7.5 08/28/2019   ALBUMIN 4.4 08/28/2019   CALCIUM 10.6 (H) 08/28/2019   GFRAA 63 02/25/2019    Speciality Comments: Osteoporosis managed by PCP.  Procedures:  No procedures performed Allergies: Patient has no known allergies.   Assessment / Plan:     Visit Diagnoses: Polymyalgia rheumatica (HCC) - her PMR is under remission.  Sjogren's syndrome-she continues to have sicca symptoms and using over-the-counter products.  Osteoporosis - She has a history of osteoporosis that has improved on Prolia.  She has been on prolia for 3-4 years.  Her osteoporosis has been managed by her PCP.  Her last bone density from 2020 was in osteopenia range.  Primary osteoarthritis of both hands-she has DIP and PIP thickening and discomfort in her hands.  Joint protection was discussed.   Pain in both feet  - Plan: XR Foot 2 Views Right, XR Foot 2 Views Left  Primary osteoarthritis of both feet-she has been experiencing increased pain in her bilateral feet.  No synovitis was noted.  X-rays obtained today were consistent with osteoarthritis.  Trochanteric bursitis, left hip-she has been experiencing left trochanteric bursa pain.  I have given her a handout on IT band exercises.  If her symptoms do not improve then she may consider getting cortisone injection.  DDD (degenerative disc disease), lumbar-she has significant scoliosis and degenerative disc disease.  She has been experiencing increased pain and discomfort in her lower back.  She has seen Dr. Deno Etienne in the past.  Have advised to schedule a follow-up appointment.  History of hypertension-her blood pressure is under good control.  History of depression-she states her depression symptoms have increased somewhat with the COVID-19.   Orders: Orders Placed This Encounter  Procedures  . XR Foot 2 Views Right  . XR Foot 2 Views Left   No orders of the defined types were placed in this encounter.   Face-to-face time spent with patient was 30 minutes. Greater than 50% of time was spent in counseling and coordination of care.  Follow-Up Instructions: Return in about 6 months (around 08/27/2020) for Sjogren's, Osteoarthritis, Polymyalgia rheumatica.   Pollyann Savoy, MD  Note - This record has been created using Animal nutritionist.  Chart creation errors have been sought, but may not always  have been located. Such creation errors do not reflect on  the standard of medical care.

## 2020-02-25 ENCOUNTER — Ambulatory Visit: Payer: Self-pay

## 2020-02-25 ENCOUNTER — Ambulatory Visit: Payer: Federal, State, Local not specified - PPO | Admitting: Rheumatology

## 2020-02-25 ENCOUNTER — Encounter (INDEPENDENT_AMBULATORY_CARE_PROVIDER_SITE_OTHER): Payer: Self-pay

## 2020-02-25 ENCOUNTER — Other Ambulatory Visit: Payer: Self-pay

## 2020-02-25 ENCOUNTER — Encounter: Payer: Self-pay | Admitting: Rheumatology

## 2020-02-25 VITALS — BP 122/72 | HR 79 | Resp 16 | Ht 61.0 in | Wt 124.8 lb

## 2020-02-25 DIAGNOSIS — M353 Polymyalgia rheumatica: Secondary | ICD-10-CM | POA: Diagnosis not present

## 2020-02-25 DIAGNOSIS — M19041 Primary osteoarthritis, right hand: Secondary | ICD-10-CM

## 2020-02-25 DIAGNOSIS — M79671 Pain in right foot: Secondary | ICD-10-CM

## 2020-02-25 DIAGNOSIS — M79672 Pain in left foot: Secondary | ICD-10-CM

## 2020-02-25 DIAGNOSIS — Z8679 Personal history of other diseases of the circulatory system: Secondary | ICD-10-CM

## 2020-02-25 DIAGNOSIS — M3501 Sicca syndrome with keratoconjunctivitis: Secondary | ICD-10-CM | POA: Diagnosis not present

## 2020-02-25 DIAGNOSIS — M7062 Trochanteric bursitis, left hip: Secondary | ICD-10-CM

## 2020-02-25 DIAGNOSIS — M810 Age-related osteoporosis without current pathological fracture: Secondary | ICD-10-CM

## 2020-02-25 DIAGNOSIS — M5136 Other intervertebral disc degeneration, lumbar region: Secondary | ICD-10-CM

## 2020-02-25 DIAGNOSIS — M19072 Primary osteoarthritis, left ankle and foot: Secondary | ICD-10-CM | POA: Diagnosis not present

## 2020-02-25 DIAGNOSIS — M19071 Primary osteoarthritis, right ankle and foot: Secondary | ICD-10-CM

## 2020-02-25 DIAGNOSIS — Z8659 Personal history of other mental and behavioral disorders: Secondary | ICD-10-CM

## 2020-02-25 DIAGNOSIS — M19042 Primary osteoarthritis, left hand: Secondary | ICD-10-CM

## 2020-02-25 DIAGNOSIS — M51369 Other intervertebral disc degeneration, lumbar region without mention of lumbar back pain or lower extremity pain: Secondary | ICD-10-CM

## 2020-02-25 NOTE — Patient Instructions (Signed)
Iliotibial Band Syndrome Rehab Ask your health care provider which exercises are safe for you. Do exercises exactly as told by your health care provider and adjust them as directed. It is normal to feel mild stretching, pulling, tightness, or discomfort as you do these exercises. Stop right away if you feel sudden pain or your pain gets significantly worse. Do not begin these exercises until told by your health care provider. Stretching and range-of-motion exercises These exercises warm up your muscles and joints and improve the movement and flexibility of your hip and pelvis. Quadriceps stretch, prone  1. Lie on your abdomen on a firm surface, such as a bed or padded floor (prone position). 2. Bend your left / right knee and reach back to hold your ankle or pant leg. If you cannot reach your ankle or pant leg, loop a belt around your foot and grab the belt instead. 3. Gently pull your heel toward your buttocks. Your knee should not slide out to the side. You should feel a stretch in the front of your thigh and knee (quadriceps). 4. Hold this position for __________ seconds. Repeat __________ times. Complete this exercise __________ times a day. Iliotibial band stretch An iliotibial band is a strong band of muscle tissue that runs from the outer side of your hip to the outer side of your thigh and knee. 1. Lie on your side with your left / right leg in the top position. 2. Bend both of your knees and grab your left / right ankle. Stretch out your bottom arm to help you balance. 3. Slowly bring your top knee back so your thigh goes behind your trunk. 4. Slowly lower your top leg toward the floor until you feel a gentle stretch on the outside of your left / right hip and thigh. If you do not feel a stretch and your knee will not fall farther, place the heel of your other foot on top of your knee and pull your knee down toward the floor with your foot. 5. Hold this position for __________  seconds. Repeat __________ times. Complete this exercise __________ times a day. Strengthening exercises These exercises build strength and endurance in your hip and pelvis. Endurance is the ability to use your muscles for a long time, even after they get tired. Straight leg raises, side-lying This exercise strengthens the muscles that rotate the leg at the hip and move it away from your body (hip abductors). 1. Lie on your side with your left / right leg in the top position. Lie so your head, shoulder, hip, and knee line up. You may bend your bottom knee to help you balance. 2. Roll your hips slightly forward so your hips are stacked directly over each other and your left / right knee is facing forward. 3. Tense the muscles in your outer thigh and lift your top leg 4-6 inches (10-15 cm). 4. Hold this position for __________ seconds. 5. Slowly return to the starting position. Let your muscles relax completely before doing another repetition. Repeat __________ times. Complete this exercise __________ times a day. Leg raises, prone This exercise strengthens the muscles that move the hips (hip extensors). 1. Lie on your abdomen on your bed or a firm surface. You can put a pillow under your hips if that is more comfortable for your lower back. 2. Bend your left / right knee so your foot is straight up in the air. 3. Squeeze your buttocks muscles and lift your left / right thigh   off the bed. Do not let your back arch. 4. Tense your thigh muscle as hard as you can without increasing any knee pain. 5. Hold this position for __________ seconds. 6. Slowly lower your leg to the starting position and allow it to relax completely. Repeat __________ times. Complete this exercise __________ times a day. Hip hike 1. Stand sideways on a bottom step. Stand on your left / right leg with your other foot unsupported next to the step. You can hold on to the railing or wall for balance if needed. 2. Keep your knees  straight and your torso square. Then lift your left / right hip up toward the ceiling. 3. Slowly let your left / right hip lower toward the floor, past the starting position. Your foot should get closer to the floor. Do not lean or bend your knees. Repeat __________ times. Complete this exercise __________ times a day. This information is not intended to replace advice given to you by your health care provider. Make sure you discuss any questions you have with your health care provider. Document Revised: 04/04/2019 Document Reviewed: 10/03/2018 [employer]vier Patient Education  2020 [employer]  

## 2020-02-26 ENCOUNTER — Ambulatory Visit: Payer: Federal, State, Local not specified - PPO | Admitting: Family Medicine

## 2020-02-27 ENCOUNTER — Other Ambulatory Visit: Payer: Self-pay

## 2020-02-27 ENCOUNTER — Encounter: Payer: Self-pay | Admitting: Family Medicine

## 2020-02-27 ENCOUNTER — Ambulatory Visit (INDEPENDENT_AMBULATORY_CARE_PROVIDER_SITE_OTHER): Payer: Federal, State, Local not specified - PPO | Admitting: Family Medicine

## 2020-02-27 VITALS — BP 115/63 | HR 74 | Temp 96.9°F | Ht 61.0 in | Wt 125.2 lb

## 2020-02-27 DIAGNOSIS — M7062 Trochanteric bursitis, left hip: Secondary | ICD-10-CM

## 2020-02-27 DIAGNOSIS — Z8659 Personal history of other mental and behavioral disorders: Secondary | ICD-10-CM

## 2020-02-27 DIAGNOSIS — M79671 Pain in right foot: Secondary | ICD-10-CM

## 2020-02-27 DIAGNOSIS — M79672 Pain in left foot: Secondary | ICD-10-CM

## 2020-02-27 DIAGNOSIS — I10 Essential (primary) hypertension: Secondary | ICD-10-CM

## 2020-02-27 DIAGNOSIS — F3181 Bipolar II disorder: Secondary | ICD-10-CM

## 2020-02-27 LAB — CBC WITH DIFFERENTIAL/PLATELET
Basophils Absolute: 0 10*3/uL (ref 0.0–0.1)
Basophils Relative: 0.6 % (ref 0.0–3.0)
Eosinophils Absolute: 0.1 10*3/uL (ref 0.0–0.7)
Eosinophils Relative: 1.1 % (ref 0.0–5.0)
HCT: 33.4 % — ABNORMAL LOW (ref 36.0–46.0)
Hemoglobin: 11.2 g/dL — ABNORMAL LOW (ref 12.0–15.0)
Lymphocytes Relative: 18.4 % (ref 12.0–46.0)
Lymphs Abs: 1.1 10*3/uL (ref 0.7–4.0)
MCHC: 33.4 g/dL (ref 30.0–36.0)
MCV: 91.9 fl (ref 78.0–100.0)
Monocytes Absolute: 0.5 10*3/uL (ref 0.1–1.0)
Monocytes Relative: 9 % (ref 3.0–12.0)
Neutro Abs: 4.1 10*3/uL (ref 1.4–7.7)
Neutrophils Relative %: 70.9 % (ref 43.0–77.0)
Platelets: 185 10*3/uL (ref 150.0–400.0)
RBC: 3.64 Mil/uL — ABNORMAL LOW (ref 3.87–5.11)
RDW: 14 % (ref 11.5–15.5)
WBC: 5.7 10*3/uL (ref 4.0–10.5)

## 2020-02-27 LAB — MICROALBUMIN / CREATININE URINE RATIO
Creatinine,U: 80.7 mg/dL
Microalb Creat Ratio: 3.7 mg/g (ref 0.0–30.0)
Microalb, Ur: 3 mg/dL — ABNORMAL HIGH (ref 0.0–1.9)

## 2020-02-27 LAB — COMPREHENSIVE METABOLIC PANEL
ALT: 12 U/L (ref 0–35)
AST: 21 U/L (ref 0–37)
Albumin: 3.9 g/dL (ref 3.5–5.2)
Alkaline Phosphatase: 46 U/L (ref 39–117)
BUN: 18 mg/dL (ref 6–23)
CO2: 31 mEq/L (ref 19–32)
Calcium: 9.6 mg/dL (ref 8.4–10.5)
Chloride: 100 mEq/L (ref 96–112)
Creatinine, Ser: 0.89 mg/dL (ref 0.40–1.20)
GFR: 61.05 mL/min (ref >60.00)
Glucose, Bld: 114 mg/dL — ABNORMAL HIGH (ref 70–99)
Potassium: 4.6 mEq/L (ref 3.5–5.1)
Sodium: 136 mEq/L (ref 135–145)
Total Bilirubin: 0.3 mg/dL (ref 0.2–1.2)
Total Protein: 6.6 g/dL (ref 6.0–8.3)

## 2020-02-27 LAB — LIPID PANEL
Cholesterol: 195 mg/dL (ref 0–200)
HDL: 77.1 mg/dL (ref >39.00)
LDL Cholesterol: 98 mg/dL (ref 0–99)
NonHDL: 118.22
Total CHOL/HDL Ratio: 3
Triglycerides: 100 mg/dL (ref 0.0–149.0)
VLDL: 20 mg/dL (ref 0.0–40.0)

## 2020-02-27 MED ORDER — AMLODIPINE BESYLATE 10 MG PO TABS: 10.0000 mg | ORAL_TABLET | Freq: Every day | ORAL | 1 refills | Status: DC

## 2020-02-27 MED ORDER — LAMOTRIGINE 25 MG PO TABS: 50.0000 mg | ORAL_TABLET | Freq: Every day | ORAL | 1 refills | Status: DC

## 2020-02-27 MED ORDER — DENOSUMAB 60 MG/ML ~~LOC~~ SOSY: 60.0000 mg | PREFILLED_SYRINGE | Freq: Once | SUBCUTANEOUS | 1 refills | Status: AC

## 2020-02-27 NOTE — Progress Notes (Signed)
Patient: Christine Scott MRN: [id number] DOB: 02/05/1941 PCP: Orland Mustard, MD     Subjective:  Chief Complaint  Patient presents with  . Hypertension    6 month f/u  . Depression  . bipolar type ii  . Foot Pain  . left trochanteric bursitis    HPI: The patient is a 79 y.o. female who presents today for hypertension follow up.   Hypertension: Here for follow up of hypertension.  Currently on amlodipine 10mg   . Home readings range from 130-140 systolic/80 diastolic. Takes medication as prescribed and denies any side effects. Exercise includes walking. Weight has been stable. Denies any chest pain, headaches, shortness of breath, vision changes, swelling in lower extremities.   Depression/bi polar: she feels like her depression is getting worse. She thinks about her husband at night who died 6 years ago. She feels really blue. She stopped her lamotrigine and got down. She has started this back up recently. She has been back on this for a month and can see some improvements. No si/hi/ah/vh.   She has bilateral foot pain on the bottom of her feet that is worse at night. Just had xrays done at her rheumatologist that showed OA in bilateral feet. She doesn't really have pain in her feet during the day unless standing for prolonged periods of time. No pain when she gets out of bed in the AM, just pain at night. No movement or need to move her legs.   Having a flair of her left hip bursitis. Declined shot at her rheumatologist.   She has received both of her covid shots.   Review of Systems  Constitutional: Negative for chills, fatigue and fever.  HENT: Negative for facial swelling, sinus pressure and sore throat.   Respiratory: Negative for cough, shortness of breath and wheezing.   Cardiovascular: Negative for chest pain and palpitations.  Gastrointestinal: Negative for abdominal pain, nausea and vomiting.  Musculoskeletal: Positive for arthralgias.       Bilateral foot pain and left  hip pain   Neurological: Negative for dizziness, light-headedness and headaches.  Psychiatric/Behavioral: Positive for dysphoric mood. Negative for self-injury, sleep disturbance and suicidal ideas.    Allergies Patient has No Known Allergies.  Past Medical History Patient  has a past medical history of Abnormal Pap smear of cervix, Depression, Dry eye, Hypertension, Osteoarthritis, Osteoporosis, and Polymyalgia rheumatica (HCC).  Surgical History Patient  has a past surgical history that includes Facial cosmetic surgery; Total vaginal hysterectomy (age 24); and Breast surgery (2016).  Family History Pateint's family history includes Colon cancer in her brother and daughter; Congestive Heart Failure in her mother; Diabetes in her brother, brother, and father; Stroke in her brother and mother.  Social History Patient  reports that she has never smoked. She has never used smokeless tobacco. She reports current alcohol use of about 1.0 standard drinks of alcohol per week. She reports that she does not use drugs.    Objective: Vitals:   02/27/20 1337  BP: 115/63  Pulse: 74  Temp: (!) 96.9 F (36.1 C)  TempSrc: Temporal  SpO2: 98%  Weight: 125 lb 3.2 oz (56.8 kg)  Height: 5\' 1"  (1.549 m)    Body mass index is 23.66 kg/m.  Physical Exam Vitals reviewed.  Constitutional:      Appearance: Normal appearance. She is well-developed and normal weight.  HENT:     Head: Normocephalic and atraumatic.     Right Ear: Tympanic membrane, ear canal and external ear normal.  Left Ear: Tympanic membrane, ear canal and external ear normal.  Eyes:     Extraocular Movements: Extraocular movements intact.     Conjunctiva/sclera: Conjunctivae normal.     Pupils: Pupils are equal, round, and reactive to light.  Neck:     Thyroid: No thyromegaly.  Cardiovascular:     Rate and Rhythm: Normal rate and regular rhythm.     Pulses: Normal pulses.     Heart sounds: Normal heart sounds. No  murmur.  Pulmonary:     Effort: Pulmonary effort is normal.     Breath sounds: Normal breath sounds.  Abdominal:     General: Abdomen is flat. Bowel sounds are normal. There is no distension.     Palpations: Abdomen is soft.     Tenderness: There is no abdominal tenderness.  Musculoskeletal:     Cervical back: Normal range of motion and neck supple.     Comments: Bilateral foot exam wnl. Normal pulses.  No TTP over plantar fascia.   Lymphadenopathy:     Cervical: No cervical adenopathy.  Skin:    General: Skin is warm and dry.     Findings: No rash.  Neurological:     General: No focal deficit present.     Mental Status: She is alert and oriented to person, place, and time.     Cranial Nerves: No cranial nerve deficit.     Coordination: Coordination normal.     Deep Tendon Reflexes: Reflexes normal.  Psychiatric:        Behavior: Behavior normal.   foot xrays independently reviewed by myself.        Office Visit from 02/27/2020 in [place]  PHQ-9 Total Score  3      Assessment/plan: 1. Essential hypertension Blood pressure is to goal. Continue current anti-hypertensive medication-norvasc 10mg . Refills given and routine lab work will be done today. Recommended routine exercise and healthy diet including DASH diet and mediterranean diet. F/u in 6 months.  - CBC with Differential/Platelet - Comprehensive metabolic panel - Lipid panel - Microalbumin / creatinine urine ratio  2. History of depression +bipolar type II. Referral placed to psychiatry back in September and was cancelled. New referral placed today and number given. Increasing her lamotrigine to 50mg  since it has been 4 weeks. Discussed she needs to stay on medication even when she feels good. Any si/hi/ah/vh she is to call 911 or go to er.  - Ambulatory referral to Psychiatry  3. Bipolar II disorder ([place]) See above.  - Ambulatory referral to Psychiatry  4. Bilateral foot pain xrays  reviewed and OA. Doesn't want to see podiatry at this time. Trial voltaren gel.   5. Trochanteric bursitis of left hip Exercises given. She didn't want shot at this time by rheumatologist. Will do trial of voltaren. Let us know if not getting better.   -prolia shot in a few weeks.   This visit occurred during the SARS-CoV-2 public health emergency.  Safety protocols were in place, including screening questions prior to the visit, additional usage of staff PPE, and extensive cleaning of exam room while observing appropriate contact time as indicated for disinfecting solutions.    Return in about 6 months (around 08/29/2020).   Orma Flaming, MD [place]   02/27/2020

## 2020-02-27 NOTE — Patient Instructions (Addendum)
[phone] [facility] Can call and make appointment, referral is in for you.   Get over the counter voltaren gel for your hip. Can rub on up to four times a day for bursitis.   Increasing your subvenite to 50mg . Sent in new px. Have to take 2 pills.    F/u in 6 months or sooner for depression if needed if you can't get in with psychiatry.   Always good to see you!   Dr. 

## 2020-02-28 ENCOUNTER — Other Ambulatory Visit: Payer: Self-pay | Admitting: Family Medicine

## 2020-02-28 DIAGNOSIS — D508 Other iron deficiency anemias: Secondary | ICD-10-CM

## 2020-03-06 ENCOUNTER — Telehealth: Payer: Self-pay | Admitting: Family Medicine

## 2020-03-06 NOTE — Telephone Encounter (Signed)
Left message to return call to our office.  

## 2020-03-06 NOTE — Telephone Encounter (Signed)
Do you do injections or do you want me to let her know to call her rheumatologist?

## 2020-03-06 NOTE — Telephone Encounter (Signed)
Christine Scott called asking if Dr. Artis Flock could treat her for bursitis in left hip. Christine Scott states she usually prefers to get a shot to help relieve the pain. Please advise.

## 2020-03-06 NOTE — Telephone Encounter (Signed)
I do not do injections and dr. Corliss Skains was going to inject this and she declined at her appointment. I would call and see if she can see her. Continue voltaren gel. Do not want to do oral steroids without seeing her and especially if her rheumatologist can inject this for her.  Orland Mustard, MD [place] [facility]

## 2020-03-09 ENCOUNTER — Telehealth: Payer: Self-pay | Admitting: Rheumatology

## 2020-03-09 NOTE — Telephone Encounter (Signed)
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   Patient states the surgeon said "there was some arthritis in that shoulder which will also be done."  Patient wants to know if that is correct because "she doesn't want to have a shoulder replacement and still have arthritis."  Patient requested a return call.

## 2020-03-09 NOTE — Telephone Encounter (Signed)
Patient advised Dr. Corliss Skains recommends Dr. Dion Saucier [facility]) or Dr. Ave Filter ([facility])

## 2020-03-09 NOTE — Telephone Encounter (Signed)
Patient would also like to know if Dr. Corliss Skains has a surgeon she would recommend for a large rotator cuff tear/ and Total Shoulder Replacement.

## 2020-03-09 NOTE — Telephone Encounter (Signed)
Dr. Corliss Skains recommends Dr. Dion Saucier [facility]) or Dr. Ave Filter ([facility])

## 2020-03-12 ENCOUNTER — Other Ambulatory Visit: Payer: Self-pay

## 2020-03-12 ENCOUNTER — Ambulatory Visit (INDEPENDENT_AMBULATORY_CARE_PROVIDER_SITE_OTHER): Payer: Federal, State, Local not specified - PPO

## 2020-03-12 DIAGNOSIS — M810 Age-related osteoporosis without current pathological fracture: Secondary | ICD-10-CM | POA: Diagnosis not present

## 2020-03-12 MED ORDER — DENOSUMAB 60 MG/ML ~~LOC~~ SOSY
60.0000 mg | PREFILLED_SYRINGE | Freq: Once | SUBCUTANEOUS | Status: AC
  Administered 2020-03-12: 12:00:00 60 mg via SUBCUTANEOUS

## 2020-03-12 NOTE — Patient Instructions (Signed)
There are no preventive care reminders to display for this patient.  Depression screen [facility] 2/9 02/27/2020  Decreased Interest 0  Down, Depressed, Hopeless 0  PHQ - 2 Score 0  Altered sleeping 0  Tired, decreased energy 0  Change in appetite 0  Feeling bad or failure about yourself  2  Trouble concentrating 1  Moving slowly or fidgety/restless 0  Suicidal thoughts 0  PHQ-9 Score 3  Difficult doing work/chores Not difficult at all  Some encounter information is confidential and restricted. Go to Review Flowsheets activity to see all data.    Recommended follow up: No follow-ups on file.

## 2020-03-12 NOTE — Progress Notes (Signed)
Per orders of Dr. Artis Flock, injection of Prolia  given by Donnamarie Poag in right deltoid. Patient tolerated injection well.

## 2020-03-31 ENCOUNTER — Other Ambulatory Visit: Payer: Self-pay | Admitting: Family Medicine

## 2020-03-31 NOTE — Telephone Encounter (Signed)
  LAST APPOINTMENT DATE: 03/12/2020   NEXT APPOINTMENT DATE:@9 /08/2020  MEDICATION:lamoTRIgine (SUBVENITE) 25 MG tablet  PHARMACY: [facility] [address], [place] - [address] [place] Phone:  [phone]  Fax:  [phone]     Patient would like to be prescribed for 100mg  instead of 25mg  she said she use to take 100mg

## 2020-03-31 NOTE — Telephone Encounter (Signed)
Patient requesting increase on Lamotrigine. She currently takes 20mg , but would like to increase to 100mg .  LOV: 03/12/2020  Next Office Visit: 09/03/2020  Approve?

## 2020-04-01 NOTE — Telephone Encounter (Signed)
Let her know I had increased this to 50mg  at her last appointment on 02/27/20. If not taking 50mg , may increase to this x 1 week and then call 04/28/20 back. I put in a new referral for a psychiatrist at her last visit as well. Has she heard from them?  Dr. 

## 2020-04-02 MED ORDER — LAMOTRIGINE 25 MG PO TABS: 75.0000 mg | ORAL_TABLET | Freq: Every day | ORAL | 1 refills | Status: DC

## 2020-04-02 NOTE — Addendum Note (Signed)
Addended by: Manuela Schwartz on: 04/02/2020 01:46 PM   Modules accepted: Orders

## 2020-04-02 NOTE — Addendum Note (Signed)
Addended by: Orland Mustard on: 04/02/2020 03:46 PM   Modules accepted: Orders

## 2020-04-02 NOTE — Telephone Encounter (Signed)
Spoke with the patient and she stated that she has been taking lamoTRIgine (SUBVENITE) 25 MG tablet 50 mg since it was called into the pharmacy. She stated that she had 2 pills left of this prescription.   Patient mentioned that she did call the psychiatrist and she was told that they were only seeing patients virtually. Patient stated that she would be open to seeing someone else as long as she can be seen in office(she's more comfortable). Please advise.

## 2020-04-02 NOTE — Telephone Encounter (Signed)
I sent in new px and she will now take 3 pills (75mg )/day. After this we can increase to 100mg  if needed.   I put in referral for new psychiatrist a while ago.. has she not been contacted?   Dr. 

## 2020-04-07 NOTE — Patient Instructions (Signed)
DUE TO COVID-19 ONLY ONE VISITOR IS ALLOWED TO COME WITH YOU AND STAY IN THE WAITING ROOM ONLY DURING PRE OP AND PROCEDURE DAY OF SURGERY. THE 2 VISITORS MAY VISIT WITH YOU AFTER SURGERY IN YOUR PRIVATE ROOM DURING VISITING HOURS ONLY!  YOU NEED TO HAVE A COVID 19 TEST ON_4/19______ @_8 :40______, THIS TEST MUST BE DONE BEFORE SURGERY, COME  [address], [place] , .  [facility]) ONCE YOUR COVID TEST IS COMPLETED, PLEASE BEGIN THE QUARANTINE INSTRUCTIONS AS OUTLINED IN YOUR HANDOUT.                Christine Scott    Your procedure is scheduled on: 04/16/20   Report to [facility] Main  Entrance   Report to Short Stay at 5:30 AM     Call this number if you have problems the morning of surgery [phone]   BRUSH YOUR TEETH MORNING OF SURGERY AND RINSE YOUR MOUTH OUT, NO CHEWING GUM CANDY OR MINTS.   Do not eat food After Midnight.   YOU MAY HAVE CLEAR LIQUIDS FROM MIDNIGHT UNTIL 4:30 AM.   At 4:30 AM Please finish the prescribed Pre-Surgery  drink.   Nothing by mouth after you finish the  drink !   Take these medicines the morning of surgery with A SIP OF WATER: Lamotrigine, Amlodipine,you may use your eye drops                                 You may not have any metal on your body including hair pins and             piercings  Do not wear jewelry, make-up, lotions, powders or perfumes, deodorant             Do not wear nail polish on your fingernails.  Do not shave  48 hours prior to surgery.               Do not bring valuables to the hospital. [facility] IS NOT             RESPONSIBLE   FOR VALUABLES.  Contacts, dentures or bridgework may not be worn into surgery.      Patients discharged the day of surgery will not be allowed to drive home.   IF YOU ARE HAVING SURGERY AND GOING HOME THE SAME DAY, YOU MUST HAVE AN ADULT TO DRIVE YOU HOME AND BE WITH YOU FOR 24 HOURS.   YOU MAY GO HOME BY TAXI OR UBER OR ORTHERWISE, BUT AN ADULT  MUST ACCOMPANY YOU HOME AND STAY WITH YOU FOR 24 HOURS.  Name and phone number of your driver:  Special Instructions: N/A              Please read over the following fact sheets you were given: _____________________________________________________________________             [facility]- Preparing for Total Shoulder Arthroplasty    Before surgery, you can play an important role. Because skin is not sterile, your skin needs to be as free of germs as possible. You can reduce the number of germs on your skin by using the following products. . Benzoyl Peroxide Gel o Reduces the number of germs present on the skin o Applied twice a day to shoulder area starting two days before surgery    ==================================================================  Please follow these instructions carefully:  BENZOYL  PEROXIDE 5% GEL  Please do not use if you have an allergy to benzoyl peroxide.   If your skin becomes reddened/irritated stop using the benzoyl peroxide.  Starting two days before surgery, apply as follows: 1. Apply benzoyl peroxide in the morning and at night. Apply after taking a shower. If you are not taking a shower clean entire shoulder front, back, and side along with the armpit with a clean wet washcloth.  2. Place a quarter-sized dollop on your shoulder and rub in thoroughly, making sure to cover the front, back, and side of your shoulder, along with the armpit.   2 days before ____ AM   ____ PM              1 day before ____ AM   ____ PM                         3. Do this twice a day for two days.  (Last application is the night before surgery, AFTER using the CHG soap as described below).  4. Do NOT apply benzoyl peroxide gel on the day of surgery. [facility] - Preparing for Surgery Before surgery, you can play an important role.  Because skin is not sterile, your skin needs to be as free of germs as possible.   You can reduce the number of germs on your skin by washing  with CHG (chlorahexidine gluconate) soap before surgery.   CHG is an antiseptic cleaner which kills germs and bonds with the skin to continue killing germs even after washing. Please DO NOT use if you have an allergy to CHG or antibacterial soaps.   If your skin becomes reddened/irritated stop using the CHG and inform your nurse when you arrive at Short Stay. Do not shave (including legs and underarms) for at least 48 hours prior to the first CHG shower.   . Please follow these instructions carefully:  1.  Shower with CHG Soap the night before surgery and the  morning of Surgery.  2.  If you choose to wash your hair, wash your hair first as usual with your  normal  shampoo.  3.  After you shampoo, rinse your hair and body thoroughly to remove the  shampoo.                                        4.  Use CHG as you would any other liquid soap.  You can apply chg directly  to the skin and wash                       Gently with a scrungie or clean washcloth.  5.  Apply the CHG Soap to your body ONLY FROM THE NECK DOWN.   Do not use on face/ open                           Wound or open sores. Avoid contact with eyes, ears mouth and genitals (private parts).                       Wash face,  Genitals (private parts) with your normal soap.             6.  Wash thoroughly, paying special attention to the area where  your surgery  will be performed.  7.  Thoroughly rinse your body with warm water from the neck down.  8.  DO NOT shower/wash with your normal soap after using and rinsing off  the CHG Soap.             9.  Pat yourself dry with a clean towel.            10.  Wear clean pajamas.            11.  Place clean sheets on your bed the night of your first shower and do not  sleep with pets. Day of Surgery : Do not apply any lotions/deodorants the morning of surgery.  Please wear clean clothes to the hospital/surgery center.  FAILURE TO FOLLOW THESE INSTRUCTIONS MAY RESULT IN THE CANCELLATION OF  YOUR SURGERY PATIENT SIGNATURE_________________________________  NURSE SIGNATURE__________________________________  ________________________________________________________________________   Rogelia Mire  An incentive spirometer is a tool that can help keep your lungs clear and active. This tool measures how well you are filling your lungs with each breath. Taking long deep breaths may help reverse or decrease the chance of developing breathing (pulmonary) problems (especially infection) following:  A long period of time when you are unable to move or be active. BEFORE THE PROCEDURE   If the spirometer includes an indicator to show your best effort, your nurse or respiratory therapist will set it to a desired goal.  If possible, sit up straight or lean slightly forward. Try not to slouch.  Hold the incentive spirometer in an upright position. INSTRUCTIONS FOR USE  1. Sit on the edge of your bed if possible, or sit up as far as you can in bed or on a chair. 2. Hold the incentive spirometer in an upright position. 3. Breathe out normally. 4. Place the mouthpiece in your mouth and seal your lips tightly around it. 5. Breathe in slowly and as deeply as possible, raising the piston or the ball toward the top of the column. 6. Hold your breath for 3-5 seconds or for as long as possible. Allow the piston or ball to fall to the bottom of the column. 7. Remove the mouthpiece from your mouth and breathe out normally. 8. Rest for a few seconds and repeat Steps 1 through 7 at least 10 times every 1-2 hours when you are awake. Take your time and take a few normal breaths between deep breaths. 9. The spirometer may include an indicator to show your best effort. Use the indicator as a goal to work toward during each repetition. 10. After each set of 10 deep breaths, practice coughing to be sure your lungs are clear. If you have an incision (the cut made at the time of surgery), support your  incision when coughing by placing a pillow or rolled up towels firmly against it. Once you are able to get out of bed, walk around indoors and cough well. You may stop using the incentive spirometer when instructed by your caregiver.  RISKS AND COMPLICATIONS  Take your time so you do not get dizzy or light-headed.  If you are in pain, you may need to take or ask for pain medication before doing incentive spirometry. It is harder to take a deep breath if you are having pain. AFTER USE  Rest and breathe slowly and easily.  It can be helpful to keep track of a log of your progress. Your caregiver can provide you with a simple table to help with  this. If you are using the spirometer at home, follow these instructions: [place] IF:   You are having difficultly using the spirometer.  You have trouble using the spirometer as often as instructed.  Your pain medication is not giving enough relief while using the spirometer.  You develop fever of 100.5 F (38.1 C) or higher. SEEK IMMEDIATE MEDICAL CARE IF:   You cough up bloody sputum that had not been present before.  You develop fever of 102 F (38.9 C) or greater.  You develop worsening pain at or near the incision site. MAKE SURE YOU:   Understand these instructions.  Will watch your condition.  Will get help right away if you are not doing well or get worse. Document Released: 04/24/2007 Document Revised: 03/05/2012 Document Reviewed: 06/25/2007 [facility] Patient Information 2014 [place], [place].   ________________________________________________________________________

## 2020-04-08 ENCOUNTER — Encounter (HOSPITAL_COMMUNITY)
Admission: RE | Admit: 2020-04-08 | Discharge: 2020-04-08 | Disposition: A | Discharge status: Home / Self Care | Payer: Federal, State, Local not specified - PPO | Source: Ambulatory Visit | Attending: Orthopedic Surgery | Admitting: Orthopedic Surgery

## 2020-04-08 ENCOUNTER — Other Ambulatory Visit: Payer: Self-pay

## 2020-04-08 ENCOUNTER — Encounter (HOSPITAL_COMMUNITY): Payer: Self-pay

## 2020-04-08 DIAGNOSIS — Z01818 Encounter for other preprocedural examination: Secondary | ICD-10-CM | POA: Insufficient documentation

## 2020-04-08 HISTORY — DX: Anxiety disorder, unspecified: F41.9

## 2020-04-08 NOTE — Progress Notes (Signed)
PCP - Dr. Joretta Bachelor Cardiologist -none   Chest x-ray - no EKG - 04/13/20 Stress Test - no ECHO - no Cardiac Cath - no  Sleep Study - no CPAP -   Fasting Blood Sugar - NA Checks Blood Sugar _____ times a day  Blood Thinner Instructions:NA Aspirin Instructions: Last Dose:  Anesthesia review:   Patient denies shortness of breath, fever, cough and chest pain at PAT appointment yes  Patient verbalized understanding of instructions that were given to them at the PAT appointment. Patient was also instructed that they will need to review over the PAT instructions again at home before surgery. Yes Polymyalgia [place]  Has resolved with no residuals

## 2020-04-13 ENCOUNTER — Inpatient Hospital Stay (HOSPITAL_COMMUNITY): Admission: RE | Admit: 2020-04-13 | Payer: Federal, State, Local not specified - PPO | Source: Ambulatory Visit

## 2020-04-13 ENCOUNTER — Other Ambulatory Visit: Payer: Self-pay

## 2020-04-13 ENCOUNTER — Encounter (HOSPITAL_COMMUNITY)
Admission: RE | Admit: 2020-04-13 | Discharge: 2020-04-13 | Disposition: A | Discharge status: Home / Self Care | Payer: Federal, State, Local not specified - PPO | Source: Ambulatory Visit | Attending: Orthopedic Surgery | Admitting: Orthopedic Surgery

## 2020-04-13 DIAGNOSIS — Z01818 Encounter for other preprocedural examination: Secondary | ICD-10-CM | POA: Diagnosis not present

## 2020-04-13 DIAGNOSIS — R9431 Abnormal electrocardiogram [ECG] [EKG]: Secondary | ICD-10-CM | POA: Insufficient documentation

## 2020-04-13 DIAGNOSIS — I10 Essential (primary) hypertension: Secondary | ICD-10-CM | POA: Diagnosis not present

## 2020-04-13 LAB — CBC
HCT: 36.8 % (ref 36.0–46.0)
Hemoglobin: 11.8 g/dL — ABNORMAL LOW (ref 12.0–15.0)
MCH: 30.5 pg (ref 26.0–34.0)
MCHC: 32.1 g/dL (ref 30.0–36.0)
MCV: 95.1 fL (ref 80.0–100.0)
Platelets: 191 10*3/uL (ref 150–400)
RBC: 3.87 MIL/uL (ref 3.87–5.11)
RDW: 13.8 % (ref 11.5–15.5)
WBC: 4.7 10*3/uL (ref 4.0–10.5)
nRBC: 0 % (ref 0.0–0.2)

## 2020-04-13 LAB — BASIC METABOLIC PANEL
Anion gap: 10 (ref 5–15)
BUN: 17 mg/dL (ref 8–23)
CO2: 29 mmol/L (ref 22–32)
Calcium: 9.4 mg/dL (ref 8.9–10.3)
Chloride: 99 mmol/L (ref 98–111)
Creatinine, Ser: 0.72 mg/dL (ref 0.44–1.00)
GFR calc Af Amer: >60 mL/min (ref >60)
GFR calc non Af Amer: >60 mL/min (ref >60)
Glucose, Bld: 87 mg/dL (ref 70–99)
Potassium: 3.9 mmol/L (ref 3.5–5.1)
Sodium: 138 mmol/L (ref 135–145)

## 2020-04-13 LAB — SURGICAL PCR SCREEN
MRSA, PCR: NEGATIVE
Staphylococcus aureus: NEGATIVE

## 2020-04-15 ENCOUNTER — Other Ambulatory Visit (HOSPITAL_COMMUNITY)
Admission: RE | Admit: 2020-04-15 | Discharge: 2020-04-15 | Disposition: A | Discharge status: Home / Self Care | Payer: Federal, State, Local not specified - PPO | Source: Ambulatory Visit | Attending: Orthopedic Surgery | Admitting: Orthopedic Surgery

## 2020-04-15 DIAGNOSIS — Z01812 Encounter for preprocedural laboratory examination: Secondary | ICD-10-CM | POA: Diagnosis not present

## 2020-04-15 DIAGNOSIS — Z20822 Contact with and (suspected) exposure to covid-19: Secondary | ICD-10-CM | POA: Insufficient documentation

## 2020-04-15 LAB — SARS CORONAVIRUS 2 (TAT 6-24 HRS): SARS Coronavirus 2: NEGATIVE

## 2020-04-15 NOTE — Anesthesia Preprocedure Evaluation (Addendum)
Anesthesia Evaluation  Patient identified by MRN, date of birth, ID band Patient awake    Reviewed: Allergy & Precautions, NPO status , Patient's Chart, lab work & pertinent test results  Airway Mallampati: II  TM Distance: >3 FB Neck ROM: Full    Dental  (+) Dental Advisory Given   Pulmonary neg pulmonary ROS,    breath sounds clear to auscultation       Cardiovascular hypertension, Pt. on medications  Rhythm:Regular Rate:Normal     Neuro/Psych  Neuromuscular disease    GI/Hepatic negative GI ROS, Neg liver ROS,   Endo/Other  negative endocrine ROS  Renal/GU negative Renal ROS     Musculoskeletal  (+) Arthritis , Rheumatoid disorders,    Abdominal   Peds  Hematology negative hematology ROS (+)   Anesthesia Other Findings   Reproductive/Obstetrics                            Lab Results  Component Value Date   WBC 4.7 04/13/2020   HGB 11.8 (L) 04/13/2020   HCT 36.8 04/13/2020   MCV 95.1 04/13/2020   PLT 191 04/13/2020   Lab Results  Component Value Date   CREATININE 0.72 04/13/2020   BUN 17 04/13/2020   NA 138 04/13/2020   K 3.9 04/13/2020   CL 99 04/13/2020   CO2 29 04/13/2020    Anesthesia Physical Anesthesia Plan  ASA: II  Anesthesia Plan: General   Post-op Pain Management:  [facility] for Post-op pain   Induction: Intravenous  PONV Risk Score and Plan: 3 and Dexamethasone, Ondansetron and Treatment may vary due to age or medical condition  Airway Management Planned: Oral ETT  Additional Equipment: None  Intra-op Plan:   Post-operative Plan: Extubation in OR  Informed Consent: I have reviewed the patients History and Physical, chart, labs and discussed the procedure including the risks, benefits and alternatives for the proposed anesthesia with the patient or authorized representative who has indicated his/her understanding and acceptance.     Dental advisory  given  Plan Discussed with:   Anesthesia Plan Comments:         Anesthesia Quick Evaluation

## 2020-04-16 ENCOUNTER — Ambulatory Visit (HOSPITAL_COMMUNITY): Payer: Federal, State, Local not specified - PPO | Admitting: Anesthesiology

## 2020-04-16 ENCOUNTER — Ambulatory Visit (HOSPITAL_COMMUNITY)
Admission: RE | Admit: 2020-04-16 | Discharge: 2020-04-17 | Disposition: A | Discharge status: Home / Self Care | Payer: Federal, State, Local not specified - PPO | Attending: Orthopedic Surgery | Admitting: Orthopedic Surgery

## 2020-04-16 ENCOUNTER — Encounter (HOSPITAL_COMMUNITY)
Admission: RE | Disposition: A | Discharge status: Home / Self Care | Payer: Self-pay | Source: Home / Self Care | Attending: Orthopedic Surgery

## 2020-04-16 ENCOUNTER — Encounter (HOSPITAL_COMMUNITY): Payer: Self-pay | Admitting: Orthopedic Surgery

## 2020-04-16 ENCOUNTER — Ambulatory Visit (HOSPITAL_COMMUNITY): Payer: Federal, State, Local not specified - PPO | Admitting: Physician Assistant

## 2020-04-16 ENCOUNTER — Other Ambulatory Visit: Payer: Self-pay

## 2020-04-16 DIAGNOSIS — Z96612 Presence of left artificial shoulder joint: Secondary | ICD-10-CM

## 2020-04-16 DIAGNOSIS — Z79899 Other long term (current) drug therapy: Secondary | ICD-10-CM | POA: Diagnosis not present

## 2020-04-16 DIAGNOSIS — M19042 Primary osteoarthritis, left hand: Secondary | ICD-10-CM | POA: Insufficient documentation

## 2020-04-16 DIAGNOSIS — I10 Essential (primary) hypertension: Secondary | ICD-10-CM | POA: Insufficient documentation

## 2020-04-16 DIAGNOSIS — M810 Age-related osteoporosis without current pathological fracture: Secondary | ICD-10-CM | POA: Insufficient documentation

## 2020-04-16 DIAGNOSIS — M75102 Unspecified rotator cuff tear or rupture of left shoulder, not specified as traumatic: Secondary | ICD-10-CM | POA: Diagnosis present

## 2020-04-16 DIAGNOSIS — F419 Anxiety disorder, unspecified: Secondary | ICD-10-CM | POA: Insufficient documentation

## 2020-04-16 DIAGNOSIS — Z79890 Hormone replacement therapy (postmenopausal): Secondary | ICD-10-CM | POA: Diagnosis not present

## 2020-04-16 DIAGNOSIS — M19041 Primary osteoarthritis, right hand: Secondary | ICD-10-CM | POA: Diagnosis not present

## 2020-04-16 DIAGNOSIS — F329 Major depressive disorder, single episode, unspecified: Secondary | ICD-10-CM | POA: Insufficient documentation

## 2020-04-16 DIAGNOSIS — M353 Polymyalgia rheumatica: Secondary | ICD-10-CM | POA: Diagnosis not present

## 2020-04-16 HISTORY — PX: REVERSE SHOULDER ARTHROPLASTY: SHX5054

## 2020-04-16 SURGERY — ARTHROPLASTY, SHOULDER, TOTAL, REVERSE
Anesthesia: General | Site: Shoulder | Laterality: Left

## 2020-04-16 MED ORDER — PANTOPRAZOLE SODIUM 40 MG PO TBEC
40.0000 mg | DELAYED_RELEASE_TABLET | Freq: Every day | ORAL | Status: DC
  Administered 2020-04-17: 09:00:00 40 mg via ORAL
  Filled 2020-04-16: qty 1

## 2020-04-16 MED ORDER — CHLORHEXIDINE GLUCONATE 4 % EX LIQD: 60.0000 mL | Freq: Once | CUTANEOUS | Status: DC

## 2020-04-16 MED ORDER — PHENYLEPHRINE 40 MCG/ML (10ML) SYRINGE FOR IV PUSH (FOR BLOOD PRESSURE SUPPORT)
PREFILLED_SYRINGE | INTRAVENOUS | Status: AC
  Filled 2020-04-16: qty 10

## 2020-04-16 MED ORDER — LIDOCAINE 2% (20 MG/ML) 5 ML SYRINGE
INTRAMUSCULAR | Status: AC
  Filled 2020-04-16: qty 5

## 2020-04-16 MED ORDER — FENTANYL CITRATE (PF) 100 MCG/2ML IJ SOLN
INTRAMUSCULAR | Status: AC
  Filled 2020-04-16: qty 2

## 2020-04-16 MED ORDER — PHENYLEPHRINE HCL-NACL 10-0.9 MG/250ML-% IV SOLN
INTRAVENOUS | Status: DC | PRN
  Administered 2020-04-16: 50 ug/min via INTRAVENOUS

## 2020-04-16 MED ORDER — STERILE WATER FOR IRRIGATION IR SOLN
Status: DC | PRN
  Administered 2020-04-16: 2000 mL

## 2020-04-16 MED ORDER — DEXAMETHASONE SODIUM PHOSPHATE 10 MG/ML IJ SOLN
INTRAMUSCULAR | Status: DC | PRN
  Administered 2020-04-16: 4 mg via INTRAVENOUS

## 2020-04-16 MED ORDER — FENTANYL CITRATE (PF) 250 MCG/5ML IJ SOLN
INTRAMUSCULAR | Status: DC | PRN
  Administered 2020-04-16: 50 ug via INTRAVENOUS

## 2020-04-16 MED ORDER — ROCURONIUM BROMIDE 10 MG/ML (PF) SYRINGE
PREFILLED_SYRINGE | INTRAVENOUS | Status: DC | PRN
  Administered 2020-04-16: 30 mg via INTRAVENOUS

## 2020-04-16 MED ORDER — DEXAMETHASONE SODIUM PHOSPHATE 10 MG/ML IJ SOLN
INTRAMUSCULAR | Status: AC
  Filled 2020-04-16: qty 1

## 2020-04-16 MED ORDER — PHENYLEPHRINE HCL (PRESSORS) 10 MG/ML IV SOLN
INTRAVENOUS | Status: AC
  Filled 2020-04-16: qty 1

## 2020-04-16 MED ORDER — METOCLOPRAMIDE HCL 5 MG/ML IJ SOLN: 5.0000 mg | Freq: Three times a day (TID) | INTRAMUSCULAR | Status: DC | PRN

## 2020-04-16 MED ORDER — KETOROLAC TROMETHAMINE 15 MG/ML IJ SOLN
7.5000 mg | Freq: Four times a day (QID) | INTRAMUSCULAR | Status: AC
  Administered 2020-04-16 – 2020-04-17 (×4): 7.5 mg via INTRAVENOUS
  Filled 2020-04-16 (×4): qty 1

## 2020-04-16 MED ORDER — AMLODIPINE BESYLATE 10 MG PO TABS
10.0000 mg | ORAL_TABLET | Freq: Every day | ORAL | Status: DC
  Administered 2020-04-17: 09:00:00 10 mg via ORAL
  Filled 2020-04-16: qty 1

## 2020-04-16 MED ORDER — EPHEDRINE 5 MG/ML INJ
INTRAVENOUS | Status: AC
  Filled 2020-04-16: qty 10

## 2020-04-16 MED ORDER — ACETAMINOPHEN 325 MG PO TABS
325.0000 mg | ORAL_TABLET | Freq: Four times a day (QID) | ORAL | Status: DC | PRN
  Filled 2020-04-16: qty 2

## 2020-04-16 MED ORDER — HYDROMORPHONE HCL 1 MG/ML IJ SOLN: 0.5000 mg | INTRAMUSCULAR | Status: DC | PRN

## 2020-04-16 MED ORDER — ONDANSETRON HCL 4 MG/2ML IJ SOLN: 4.0000 mg | Freq: Four times a day (QID) | INTRAMUSCULAR | Status: DC | PRN

## 2020-04-16 MED ORDER — DOCUSATE SODIUM 100 MG PO CAPS
100.0000 mg | ORAL_CAPSULE | Freq: Two times a day (BID) | ORAL | Status: DC
  Administered 2020-04-16 – 2020-04-17 (×2): 100 mg via ORAL
  Filled 2020-04-16 (×2): qty 1

## 2020-04-16 MED ORDER — LAMOTRIGINE 25 MG PO TABS
75.0000 mg | ORAL_TABLET | Freq: Every day | ORAL | Status: DC
  Administered 2020-04-17: 75 mg via ORAL
  Filled 2020-04-16: qty 3

## 2020-04-16 MED ORDER — PHENYLEPHRINE 40 MCG/ML (10ML) SYRINGE FOR IV PUSH (FOR BLOOD PRESSURE SUPPORT)
PREFILLED_SYRINGE | INTRAVENOUS | Status: DC | PRN
  Administered 2020-04-16 (×3): 80 ug via INTRAVENOUS

## 2020-04-16 MED ORDER — BISACODYL 5 MG PO TBEC: 5.0000 mg | DELAYED_RELEASE_TABLET | Freq: Every day | ORAL | Status: DC | PRN

## 2020-04-16 MED ORDER — CEFAZOLIN SODIUM-DEXTROSE 2-4 GM/100ML-% IV SOLN
INTRAVENOUS | Status: AC
  Filled 2020-04-16: qty 100

## 2020-04-16 MED ORDER — ONDANSETRON HCL 4 MG PO TABS: 4.0000 mg | ORAL_TABLET | Freq: Four times a day (QID) | ORAL | Status: DC | PRN

## 2020-04-16 MED ORDER — TRANEXAMIC ACID-NACL 1000-0.7 MG/100ML-% IV SOLN
INTRAVENOUS | Status: AC
  Filled 2020-04-16: qty 100

## 2020-04-16 MED ORDER — PROPOFOL 10 MG/ML IV BOLUS
INTRAVENOUS | Status: DC | PRN
  Administered 2020-04-16: 100 mg via INTRAVENOUS
  Administered 2020-04-16: 25 mg via INTRAVENOUS

## 2020-04-16 MED ORDER — CEFAZOLIN SODIUM-DEXTROSE 2-4 GM/100ML-% IV SOLN
2.0000 g | INTRAVENOUS | Status: AC
  Administered 2020-04-16: 2 g via INTRAVENOUS

## 2020-04-16 MED ORDER — OXYCODONE HCL 5 MG PO TABS
5.0000 mg | ORAL_TABLET | ORAL | Status: DC | PRN
  Administered 2020-04-17 (×2): 5 mg via ORAL
  Filled 2020-04-16 (×2): qty 1

## 2020-04-16 MED ORDER — GLYCOPYRROLATE PF 0.2 MG/ML IJ SOSY
PREFILLED_SYRINGE | INTRAMUSCULAR | Status: AC
  Filled 2020-04-16: qty 1

## 2020-04-16 MED ORDER — ALUM & MAG HYDROXIDE-SIMETH 200-200-20 MG/5ML PO SUSP: 30.0000 mL | ORAL | Status: DC | PRN

## 2020-04-16 MED ORDER — DIPHENHYDRAMINE HCL 12.5 MG/5ML PO ELIX
12.5000 mg | ORAL_SOLUTION | ORAL | Status: DC | PRN
  Administered 2020-04-17: 01:00:00 25 mg via ORAL
  Filled 2020-04-16: qty 10

## 2020-04-16 MED ORDER — METOCLOPRAMIDE HCL 5 MG PO TABS: 5.0000 mg | ORAL_TABLET | Freq: Three times a day (TID) | ORAL | Status: DC | PRN

## 2020-04-16 MED ORDER — METHOCARBAMOL 1000 MG/10ML IJ SOLN
500.0000 mg | Freq: Four times a day (QID) | INTRAVENOUS | Status: DC | PRN
  Filled 2020-04-16: qty 5

## 2020-04-16 MED ORDER — METHOCARBAMOL 500 MG PO TABS: 500.0000 mg | ORAL_TABLET | Freq: Four times a day (QID) | ORAL | Status: DC | PRN

## 2020-04-16 MED ORDER — ROCURONIUM BROMIDE 10 MG/ML (PF) SYRINGE
PREFILLED_SYRINGE | INTRAVENOUS | Status: AC
  Filled 2020-04-16: qty 10

## 2020-04-16 MED ORDER — OXYCODONE HCL 5 MG PO TABS: 10.0000 mg | ORAL_TABLET | ORAL | Status: DC | PRN

## 2020-04-16 MED ORDER — BUPIVACAINE-EPINEPHRINE (PF) 0.5% -1:200000 IJ SOLN
INTRAMUSCULAR | Status: DC | PRN
  Administered 2020-04-16: 15 mL via PERINEURAL

## 2020-04-16 MED ORDER — PROPOFOL 10 MG/ML IV BOLUS
INTRAVENOUS | Status: AC
  Filled 2020-04-16: qty 20

## 2020-04-16 MED ORDER — PHENOL 1.4 % MT LIQD: 1.0000 | OROMUCOSAL | Status: DC | PRN

## 2020-04-16 MED ORDER — SUGAMMADEX SODIUM 200 MG/2ML IV SOLN
INTRAVENOUS | Status: DC | PRN
  Administered 2020-04-16: 200 mg via INTRAVENOUS

## 2020-04-16 MED ORDER — 0.9 % SODIUM CHLORIDE (POUR BTL) OPTIME
TOPICAL | Status: DC | PRN
  Administered 2020-04-16: 1000 mL

## 2020-04-16 MED ORDER — ONDANSETRON HCL 4 MG/2ML IJ SOLN
INTRAMUSCULAR | Status: DC | PRN
  Administered 2020-04-16: 4 mg via INTRAVENOUS

## 2020-04-16 MED ORDER — MENTHOL 3 MG MT LOZG: 1.0000 | LOZENGE | OROMUCOSAL | Status: DC | PRN

## 2020-04-16 MED ORDER — PROMETHAZINE HCL 25 MG/ML IJ SOLN: 6.2500 mg | INTRAMUSCULAR | Status: DC | PRN

## 2020-04-16 MED ORDER — LIDOCAINE 2% (20 MG/ML) 5 ML SYRINGE
INTRAMUSCULAR | Status: DC | PRN
  Administered 2020-04-16: 20 mg via INTRAVENOUS

## 2020-04-16 MED ORDER — LACTATED RINGERS IV SOLN: INTRAVENOUS | Status: DC

## 2020-04-16 MED ORDER — POLYETHYLENE GLYCOL 3350 17 G PO PACK: 17.0000 g | PACK | Freq: Every day | ORAL | Status: DC | PRN

## 2020-04-16 MED ORDER — MAGNESIUM CITRATE PO SOLN: 1.0000 | Freq: Once | ORAL | Status: DC | PRN

## 2020-04-16 MED ORDER — BUPIVACAINE LIPOSOME 1.3 % IJ SUSP
INTRAMUSCULAR | Status: DC | PRN
  Administered 2020-04-16: 10 mL via PERINEURAL

## 2020-04-16 MED ORDER — TRANEXAMIC ACID-NACL 1000-0.7 MG/100ML-% IV SOLN: 1000.0000 mg | INTRAVENOUS | Status: DC

## 2020-04-16 MED ORDER — FENTANYL CITRATE (PF) 100 MCG/2ML IJ SOLN: 25.0000 ug | INTRAMUSCULAR | Status: DC | PRN

## 2020-04-16 SURGICAL SUPPLY — 78 items
ADH SKN CLS APL DERMABOND .7 (GAUZE/BANDAGES/DRESSINGS) ×1
AID PSTN UNV HD RSTRNT DISP (MISCELLANEOUS) ×1
BAG SPEC THK2 15X12 ZIP CLS (MISCELLANEOUS) ×1
BAG ZIPLOCK 12X15 (MISCELLANEOUS) ×2 IMPLANT
BLADE SAW SGTL 83.5X18.5 (BLADE) ×2 IMPLANT
BSPLAT GLND +2X24 MDLR (Joint) ×1 IMPLANT
COOLER ICEMAN CLASSIC (MISCELLANEOUS) IMPLANT
COVER BACK TABLE 60X90IN (DRAPES) ×2 IMPLANT
COVER SURGICAL LIGHT HANDLE (MISCELLANEOUS) ×2 IMPLANT
COVER WAND RF STERILE (DRAPES) IMPLANT
CUP SUT UNIV REVERS 36 NEUTRAL (Cup) ×1 IMPLANT
DERMABOND ADVANCED (GAUZE/BANDAGES/DRESSINGS) ×1
DERMABOND ADVANCED .7 DNX12 (GAUZE/BANDAGES/DRESSINGS) ×1 IMPLANT
DRAPE INCISE IOBAN 66X45 STRL (DRAPES) IMPLANT
DRAPE ORTHO SPLIT 77X108 STRL (DRAPES) ×4
DRAPE SHEET LG 3/4 BI-LAMINATE (DRAPES) ×2 IMPLANT
DRAPE SURG 17X11 SM STRL (DRAPES) ×2 IMPLANT
DRAPE SURG ORHT 6 SPLT 77X108 (DRAPES) ×2 IMPLANT
DRAPE U-SHAPE 47X51 STRL (DRAPES) ×2 IMPLANT
DRESSING AQUACEL AG SP 3.5X6 (GAUZE/BANDAGES/DRESSINGS) IMPLANT
DRSG AQUACEL AG ADV 3.5X10 (GAUZE/BANDAGES/DRESSINGS) ×2 IMPLANT
DRSG AQUACEL AG SP 3.5X6 (GAUZE/BANDAGES/DRESSINGS) ×2
DURAPREP 26ML APPLICATOR (WOUND CARE) ×2 IMPLANT
ELECT BLADE TIP CTD 4 INCH (ELECTRODE) ×2 IMPLANT
ELECT REM PT RETURN 15FT ADLT (MISCELLANEOUS) ×2 IMPLANT
FACESHIELD WRAPAROUND (MASK) ×8 IMPLANT
FACESHIELD WRAPAROUND OR TEAM (MASK) ×4 IMPLANT
GLENOID UNI REV MOD 24 +2 LAT (Joint) ×1 IMPLANT
GLENOSPHERE 36 +4 LAT/24 (Joint) ×1 IMPLANT
GLOVE BIO SURGEON STRL SZ7.5 (GLOVE) ×2 IMPLANT
GLOVE BIO SURGEON STRL SZ8 (GLOVE) ×2 IMPLANT
GLOVE SS BIOGEL STRL SZ 7 (GLOVE) ×1 IMPLANT
GLOVE SS BIOGEL STRL SZ 7.5 (GLOVE) ×1 IMPLANT
GLOVE SUPERSENSE BIOGEL SZ 7 (GLOVE) ×1
GLOVE SUPERSENSE BIOGEL SZ 7.5 (GLOVE) ×1
GLOVE SURG SYN 7.0 (GLOVE) IMPLANT
GLOVE SURG SYN 7.0 PF PI (GLOVE) IMPLANT
GLOVE SURG SYN 7.5  E (GLOVE)
GLOVE SURG SYN 7.5 E (GLOVE) IMPLANT
GLOVE SURG SYN 7.5 PF PI (GLOVE) IMPLANT
GLOVE SURG SYN 8.0 (GLOVE) IMPLANT
GLOVE SURG SYN 8.0 PF PI (GLOVE) IMPLANT
GOWN STRL REUS W/TWL LRG LVL3 (GOWN DISPOSABLE) ×4 IMPLANT
INSERT HUMERAL 36 +6 (Shoulder) ×1 IMPLANT
KIT BASIN (CUSTOM PROCEDURE TRAY) ×2 IMPLANT
KIT TURNOVER KIT A (KITS) IMPLANT
MANIFOLD NEPTUNE II (INSTRUMENTS) ×2 IMPLANT
NDL TAPERED W/ NITINOL LOOP (MISCELLANEOUS) ×1 IMPLANT
NEEDLE TAPERED W/ NITINOL LOOP (MISCELLANEOUS) ×4 IMPLANT
NS IRRIG 1000ML POUR BTL (IV SOLUTION) ×2 IMPLANT
PACK SHOULDER (CUSTOM PROCEDURE TRAY) ×2 IMPLANT
PAD ARMBOARD 7.5X6 YLW CONV (MISCELLANEOUS) ×2 IMPLANT
PAD COLD SHLDR WRAP-ON (PAD) IMPLANT
PIN SET MODULAR GLENOID SYSTEM (PIN) ×1 IMPLANT
RESTRAINT HEAD UNIVERSAL NS (MISCELLANEOUS) ×2 IMPLANT
SCREW CENTRAL MOD 30MM (Screw) ×1 IMPLANT
SCREW PERI LOCK 5.5X16 (Screw) ×1 IMPLANT
SCREW PERI LOCK 5.5X24 (Screw) ×1 IMPLANT
SCREW PERI LOCK 5.5X32 (Screw) ×1 IMPLANT
SCREW PERIPHERAL 5.5X20 LOCK (Screw) ×1 IMPLANT
SLING ARM FOAM STRAP LRG (SOFTGOODS) IMPLANT
SLING ARM FOAM STRAP MED (SOFTGOODS) IMPLANT
SLING ARM IMMOBILIZER MED (SOFTGOODS) ×1 IMPLANT
SPONGE LAP 18X18 RF (DISPOSABLE) IMPLANT
STEM HUMERAL UNI REVERS SZ6 (Stem) ×1 IMPLANT
SUCTION FRAZIER HANDLE 12FR (TUBING) ×2
SUCTION TUBE FRAZIER 12FR DISP (TUBING) ×1 IMPLANT
SUT FIBERWIRE #2 38 T-5 BLUE (SUTURE)
SUT MNCRL AB 3-0 PS2 18 (SUTURE) ×2 IMPLANT
SUT MON AB 2-0 CT1 36 (SUTURE) ×2 IMPLANT
SUT VIC AB 1 CT1 36 (SUTURE) ×2 IMPLANT
SUTURE FIBERWR #2 38 T-5 BLUE (SUTURE) IMPLANT
SUTURE TAPE 1.3 40 TPR END (SUTURE) ×2 IMPLANT
SUTURETAPE 1.3 40 TPR END (SUTURE) ×4
TOWEL OR 17X26 10 PK STRL BLUE (TOWEL DISPOSABLE) ×2 IMPLANT
TOWEL OR NON WOVEN STRL DISP B (DISPOSABLE) ×2 IMPLANT
WATER STERILE IRR 1000ML POUR (IV SOLUTION) ×4 IMPLANT
YANKAUER SUCT BULB TIP 10FT TU (MISCELLANEOUS) ×2 IMPLANT

## 2020-04-16 NOTE — Transfer of Care (Signed)
Immediate Anesthesia Transfer of Care Note  Patient: Christine Scott  Procedure(s) Performed: REVERSE SHOULDER ARTHROPLASTY (Left Shoulder)  Patient Location: PACU  Anesthesia Type:GA combined with regional for post-op pain  Level of Consciousness: awake, alert , oriented and patient cooperative  Airway & Oxygen Therapy: Patient Spontanous Breathing and Patient connected to nasal cannula oxygen  Post-op Assessment: Report given to RN, Post -op Vital signs reviewed and stable and Patient moving all extremities  Post vital signs: Reviewed and stable  Last Vitals:  Vitals Value Taken Time  BP 133/65 04/16/20 0921  Temp    Pulse 73 04/16/20 0924  Resp 14 04/16/20 0924  SpO2 100 % 04/16/20 0924  Vitals shown include unvalidated device data.  Last Pain:  Vitals:   04/16/20 0559  TempSrc: Oral         Complications: No apparent anesthesia complications

## 2020-04-16 NOTE — Anesthesia Procedure Notes (Signed)
Procedure Name: Intubation Date/Time: 04/16/2020 7:47 AM Performed by: Myna Bright, CRNA Pre-anesthesia Checklist: Patient identified, Emergency Drugs available, Patient being monitored and Suction available Patient Re-evaluated:Patient Re-evaluated prior to induction Oxygen Delivery Method: Circle system utilized Preoxygenation: Pre-oxygenation with 100% oxygen Induction Type: IV induction Ventilation: Mask ventilation without difficulty Laryngoscope Size: Mac and 3 Grade View: Grade I Tube type: Oral Tube size: 7.0 mm Number of attempts: 1 Airway Equipment and Method: Stylet Placement Confirmation: ETT inserted through vocal cords under direct vision,  positive ETCO2 and breath sounds checked- equal and bilateral Secured at: 21 cm Tube secured with: Tape Dental Injury: Teeth and Oropharynx as per pre-operative assessment

## 2020-04-16 NOTE — H&P (Signed)
Marina Gravel Kimberling    Chief Complaint: Left shoulder rotator cuff tear arthropathy HPI: The patient is a 79 y.o. 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 Abnormal Pap smear of cervix    --hx abnormal paps in her 30s and per patient this was reason for her Hysterectomy  . Anxiety   . Depression   . Dry eye   . Hypertension   . Osteoarthritis    hands  . Osteoporosis   . Polymyalgia rheumatica (HCC)     Past Surgical History:  Procedure Laterality Date  . ABDOMINAL HYSTERECTOMY    . BREAST SURGERY  2016   benign mass removed in [place], [place].  Marland Kitchen FACIAL COSMETIC SURGERY    . TOTAL VAGINAL HYSTERECTOMY  age 87   --due to abnormal pap smears per patient    Family History  Problem Relation Age of Onset  . Congestive Heart Failure Mother   . Stroke Mother   . Diabetes Father   . Colon cancer Brother   . Diabetes Brother   . Diabetes Brother   . Stroke Brother   . Colon cancer Daughter     Social History:  reports that she has never smoked. She has never used smokeless tobacco. She reports current alcohol use of about 1.0 standard drinks of alcohol per week. She reports that she does not use drugs.   Medications Prior to Admission  Medication Sig Dispense Refill  . acetaminophen (TYLENOL) 500 MG tablet Take 1,000 mg by mouth at bedtime.    Marland Kitchen amLODipine (NORVASC) 10 MG tablet Take 1 tablet (10 mg total) by mouth daily. 90 tablet 1  . B Complex-Biotin-FA (B-COMPLEX PO) Take 1 tablet by mouth daily. Super    . Calcium Carbonate-Vitamin D3 (CALCIUM 600-D) 600-400 MG-UNIT TABS Take 1 tablet by mouth daily.     . Cholecalciferol (VITAMIN D3) 50 MCG (2000 UT) TABS Take 2,000 mg by mouth daily.    Marland Kitchen conjugated estrogens (PREMARIN) vaginal cream Place 1/2 gram per vagina at bedtime twice weekly. (Patient taking  differently: Place 1 Applicatorful vaginally 2 (two) times a week. Place 1/2 gram) 30 g 1  . lamoTRIgine (SUBVENITE) 25 MG tablet Take 3 tablets (75 mg total) by mouth daily. 90 tablet 1  . Multiple Vitamins-Minerals (MULTIVITAMIN PO) Take 1 tablet by mouth daily. Centrum silver    . Omega-3 Fatty Acids (FISH OIL) 1000 MG CAPS Take 1,000 mg by mouth daily.     Bertram Gala Glycol-Propyl Glycol (SYSTANE ULTRA OP) Place 1 drop into both eyes in the morning and at bedtime.     Cliffton Asters Petrolatum-Mineral Oil (SYSTANE NIGHTTIME) OINT Place 1 application into both eyes at bedtime as needed (Dry eye).        Physical Exam: Left shoulder demonstrates painful and guarded motion with global weakness and pain as noted at recent office visits.  Diagnostic studies confirm changes consistent with advanced rotator cuff tear arthropathy.  Vitals     Assessment/Plan  Impression: Left shoulder rotator cuff tear arthropathy  Plan of Action: Procedure(s): REVERSE SHOULDER ARTHROPLASTY  Judithann Villamar M Corinda Ammon 04/16/2020, 5:53 AM Contact # [phone]

## 2020-04-16 NOTE — Op Note (Signed)
04/16/2020  9:03 AM  PATIENT:   Christine Scott  80 y.o. female  PRE-OPERATIVE DIAGNOSIS:  Left shoulder rotator cuff tear arthropathy  POST-OPERATIVE DIAGNOSIS: Same  PROCEDURE: Left shoulder reverse arthroplasty utilizing a press-fit size 6 Arthrex stem with a +6 polyethylene insert, 36/+4 glenosphere on a small/+2 baseplate  SURGEON:  Krystie Leiter, Metta Clines M.D.  ASSISTANTS: Jenetta Loges, PA-C  ANESTHESIA:   General endotracheal and interscalene block with Exparel  EBL: 100 cc  SPECIMEN: None  Drains: None   PATIENT DISPOSITION:  PACU - hemodynamically stable.    PLAN OF CARE: Admit for overnight observation  Brief history:  Christine Scott is a 79 year old female who has had chronic and progressively increasing left shoulder pain related to advanced rotator cuff tear arthropathy.  Due to her increasing pain and functional mentation she is brought to the operating this time for planned left reverse shoulder arthroplasty.  Preoperatively I counseled Christine Scott regarding treatment options as well as the potential risks versus benefits thereof.  Possible surgical complications were reviewed including bleeding, infection, neurovascular injury, persistent pain, loss of motion, anesthetic complication, failure of the implant, and possible need for additional surgery.  She understands, and accepts, and agrees with our planned procedure.  Procedure in detail:  After undergoing routine preop evaluation patient received prophylactic antibiotics and interscalene block with Exparel was established in the holding area by the anesthesia department.  Placed supine on the operating table and underwent the smooth induction of a general endotracheal anesthesia.  Placed in the beachchair position and appropriately padded and protected.  Left shoulder girdle region was sterilely prepped and draped in standard fashion.  Timeout was called.  Anterior deltopectoral approach left shoulder is made through an  approximate centimeter incision.  Skin flaps elevated dissection carried deeply electrocautery was used for hemostasis in the interval was developed from proximal to this with the vein taken laterally.  Conjoined tendon mobilized retracted medially the upper centimeter and half pectoralis major tenotomized for exposure in the bicep tendon long head was then tenodesed at the upper border the pectoralis major tendon the proximal segment was unroofed and excised.  Subscapularis was then divided from the lesser tuberosity and tagged with a pair of FiberWire sutures and the subscap was then reflected medially and capsular attachments divided on the anterior inferior margins of the humeral neck and humeral head was delivered through the wound.  Extra medullary guide used to outline the proposed humeral head resection which was then performed at approximate 20 degrees retroversion with an oscillating saw care taken to protect the superior and posterior rotator cuff.  Metal cap placed over the cut proximal humeral surface and at this point the glenoid was then exposed with appropriate retractors and a circumferential labral resection was then completed such that complete visualization of the glenoid was obtained.  A guidepin was then placed into the center of the glenoid with an approximately 5 degree inferior tilt.  The glenoid was then reamed with our central and peripheral reamers and the central drill hole was completed with appropriate drilling and tapping.  30 mm lag screw was applied and the baseplate was then inserted and the peripheral locking screws were all placed with excellent fit and fixation.  36/+4 glenosphere inserted onto the baseplate impacted in the central locking screw was placed.  We then returned our attention to the proximal humerus which was then hand reamed and then broached up to a size 6 with excellent fit fixation.  Neutral metaphyseal reamer  placed and reamed.  A trial reduction was then  performed showing good soft tissue balance and good stability.  Our final implant was then assembled on the back table and this was then impacted into position with excellent fit and fixation.  Trial reductions were then performed and ultimately the +6 polyethylene insert gave [place] the best soft tissue balance.  The final +6 polywas then impacted final reduction was then performed this again showed good stability good motion and good soft tissue balance.  The joint was copiously irrigated.  The subscapularis was then repaired back to the eyelets on the collar of our implant using the previously placed FiberWire sutures.  The deltopectoral interval was then repaired with a series of figure-of-eight and 1 Vicryl sutures.  2-0 Vicryl used for subcu layer and intracuticular 3 Monocryl for the skin followed by Dermabond Aquasol dressing left arm placed in a sling patient was awakened, extubated, and taken to recovery in stable condition.  Ralene Bathe, PA-C was used as an Geophysicist/field seismologist throughout this case essential for help with positioning the patient, positioning of extremity, tissue manipulation, implantation of the prosthesis, wound closure, and intraoperative decision-making.  Senaida Lange MD   Contact # [phone]

## 2020-04-16 NOTE — Anesthesia Postprocedure Evaluation (Signed)
Anesthesia Post Note  Patient: Christine Scott  Procedure(s) Performed: REVERSE SHOULDER ARTHROPLASTY (Left Shoulder)     Patient location during evaluation: PACU Anesthesia Type: General Level of consciousness: awake and alert Pain management: pain level controlled Vital Signs Assessment: post-procedure vital signs reviewed and stable Respiratory status: spontaneous breathing, nonlabored ventilation, respiratory function stable and patient connected to nasal cannula oxygen Cardiovascular status: blood pressure returned to baseline and stable Postop Assessment: no apparent nausea or vomiting Anesthetic complications: no    Last Vitals:  Vitals:   04/16/20 1340 04/16/20 1440  BP: 134/63 (!) 121/58  Pulse: 75 73  Resp: 16 16  Temp: (!) 36.4 C   SpO2: 100% 100%    Last Pain:  Vitals:   04/16/20 1400  TempSrc:   PainSc: 0-No pain                 Kennieth Rad

## 2020-04-16 NOTE — Anesthesia Procedure Notes (Signed)
Anesthesia Regional Block: Interscalene brachial plexus block   Pre-Anesthetic Checklist: ,, timeout performed, Correct Patient, Correct Site, Correct Laterality, Correct Procedure, Correct Position, site marked, Risks and benefits discussed,  Surgical consent,  Pre-op evaluation,  At surgeon's request and post-op pain management  Laterality: Left  Prep: chloraprep       Needles:  Injection technique: Single-shot  Needle Type: Echogenic Stimulator Needle     Needle Length: 9cm  Needle Gauge: 21     Additional Needles:   Procedures:, nerve stimulator,,, ultrasound used (permanent image in chart),,,,   Nerve Stimulator or Paresthesia:  Response: deltoid, 0.5 mA,   Additional Responses:   Narrative:  Start time: 04/16/2020 7:00 AM End time: 04/16/2020 7:09 AM Injection made incrementally with aspirations every 5 mL.  Performed by: Personally  Anesthesiologist: Marcene Duos, MD

## 2020-04-16 NOTE — Discharge Instructions (Signed)
 Kevin M. Supple, M.D., F.A.A.O.S. Orthopaedic Surgery Specializing in Arthroscopic and Reconstructive Surgery of the Shoulder [phone] [address] - [place], [place] [address] - Fax [phone]   POST-OP TOTAL SHOULDER REPLACEMENT INSTRUCTIONS  1. Follow up in the office for your first post-op appointment 10-14 days from the date of your surgery. If you do not already have a scheduled appointment, our office will contact you to schedule.  2. The bandage over your incision is waterproof. You may begin showering with this dressing on. You may leave this dressing on until first follow up appointment within 2 weeks. We prefer you leave this dressing in place until follow up however after 5-7 days if you are having itching or skin irritation and would like to remove it you may do so. Go slow and tug at the borders gently to break the bond the dressing has with the skin. At this point if there is no drainage it is okay to go without a bandage or you may cover it with a light guaze and tape. You can also expect significant bruising around your shoulder that will drift down your arm and into your chest wall. This is very normal and should resolve over several days.   3. Wear your sling/immobilizer at all times except to perform the exercises below or to occasionally let your arm dangle by your side to stretch your elbow. You also need to sleep in your sling immobilizer until instructed otherwise. It is ok to remove your sling if you are sitting in a controlled environment and allow your arm to rest in a position of comfort by your side or on your lap with pillows to give your neck and skin a break from the sling. You may remove it to allow arm to dangle by side to shower. If you are up walking around and when you go to sleep at night you need to wear it.  4. Range of motion to your elbow, wrist, and hand are encouraged 3-5 times daily. Exercise to your hand and fingers helps to reduce  swelling you may experience.   5. Prescriptions for a pain medication and a muscle relaxant are provided for you. It is recommended that if you are experiencing pain that you pain medication alone is not controlling, add the muscle relaxant along with the pain medication which can give additional pain relief. The first 1-2 days is generally the most severe of your pain and then should gradually decrease. As your pain lessens it is recommended that you decrease your use of the pain medications to an "as needed basis'" only and to always comply with the recommended dosages of the pain medications.  6. Pain medications can produce constipation along with their use. If you experience this, the use of an over the counter stool softener or laxative daily is recommended.   7. For additional questions or concerns, please do not hesitate to call the office. If after hours there is an answering service to forward your concerns to the physician on call.  8.Pain control following an exparel block  To help control your post-operative pain you received a nerve block  performed with Exparel which is a long acting anesthetic (numbing agent) which can provide pain relief and sensations of numbness (and relief of pain) in the operative shoulder and arm for up to 3 days. Sometimes it provides mixed relief, meaning you may still have numbness in certain areas of the arm but can still be able to   move  parts of that arm, hand, and fingers. We recommend that your prescribed pain medications  be used as needed. We do not feel it is necessary to "pre medicate" and "stay ahead" of pain.  Taking narcotic pain medications when you are not having any pain can lead to unnecessary and potentially dangerous side effects.    9. Use the ice machine as much as possible in the first 5-7 days from surgery, then you can wean its use to as needed. The ice typically needs to be replaced every 6 hours, instead of ice you can actually freeze  water bottles to put in the cooler and then fill water around them to avoid having to purchase ice. You can have spare water bottles freezing to allow you to rotate them once they have melted. Try to have a thin shirt or light cloth or towel under the ice wrap to protect your skin.   10.  We recommend that you avoid any dental work or cleaning in the first 3 months following your joint replacement. This is to help minimize the possibility of infection from the bacteria in your mouth that enters your bloodstream during dental work. We also recommend that you take an antibiotic prior to your dental work for the first year after your shoulder replacement to further help reduce that risk. Please simply contact our office for antibiotics to be sent to your pharmacy prior to dental work.  POST-OP EXERCISES  Pendulum Exercises  Perform pendulum exercises while standing and bending at the waist. Support your uninvolved arm on a table or chair and allow your operated arm to hang freely. Make sure to do these exercises passively - not using you shoulder muscles. These exercises can be performed once your nerve block effects have worn off.  Repeat 20 times. Do 3 sessions per day.     

## 2020-04-17 ENCOUNTER — Encounter: Payer: Self-pay | Admitting: *Deleted

## 2020-04-17 DIAGNOSIS — M75102 Unspecified rotator cuff tear or rupture of left shoulder, not specified as traumatic: Secondary | ICD-10-CM | POA: Diagnosis not present

## 2020-04-17 MED ORDER — ONDANSETRON HCL 4 MG PO TABS: 4.0000 mg | ORAL_TABLET | Freq: Three times a day (TID) | ORAL | 0 refills | Status: DC | PRN

## 2020-04-17 MED ORDER — OXYCODONE-ACETAMINOPHEN 5-325 MG PO TABS: 1.0000 | ORAL_TABLET | ORAL | 0 refills | Status: DC | PRN

## 2020-04-17 MED ORDER — CYCLOBENZAPRINE HCL 10 MG PO TABS: 10.0000 mg | ORAL_TABLET | Freq: Three times a day (TID) | ORAL | 1 refills | Status: DC | PRN

## 2020-04-17 NOTE — Discharge Summary (Signed)
PATIENT ID:      Christine Scott  MRN:     [id number] DOB/AGE:    May 18, 1941 / 79 y.o.     DISCHARGE SUMMARY  ADMISSION DATE:    04/16/2020 DISCHARGE DATE:    ADMISSION DIAGNOSIS: Left shoulder rotator cuff tear arthropathy Past Medical History:  Diagnosis Date  . Abnormal Pap smear of cervix    --hx abnormal paps in her 30s and per patient this was reason for her Hysterectomy  . Anxiety   . Depression   . Dry eye   . Hypertension   . Osteoarthritis    hands  . Osteoporosis   . Polymyalgia rheumatica (HCC)     DISCHARGE DIAGNOSIS:   Active Problems:   S/P reverse total shoulder arthroplasty, left   PROCEDURE: Procedure(s): REVERSE SHOULDER ARTHROPLASTY on 04/16/2020  CONSULTS:    HISTORY:  See H&P in chart.  HOSPITAL COURSE:  Christine Scott is a 79 y.o. admitted on 04/16/2020 with a diagnosis of Left shoulder rotator cuff tear arthropathy.  They were brought to the operating room on 04/16/2020 and underwent Procedure(s): REVERSE SHOULDER ARTHROPLASTY.    They were given perioperative antibiotics:  Anti-infectives (From admission, onward)   Start     Dose/Rate Route Frequency Ordered Stop   04/16/20 0600  ceFAZolin (ANCEF) IVPB 2g/100 mL premix     2 g 200 mL/hr over 30 Minutes Intravenous On call to O.R. 04/16/20 7425 04/16/20 0751   04/16/20 0535  ceFAZolin (ANCEF) 2-4 GM/100ML-% IVPB    Note to Pharmacy: Viviano Simas   : cabinet override      04/16/20 0535 04/16/20 0802    .  Patient underwent the above named procedure and tolerated it well. The following day they were hemodynamically stable and pain was controlled on oral analgesics. They were neurovascularly intact to the operative extremity. OT was ordered and worked with patient per protocol. They were medically and orthopaedically stable for discharge on .    DIAGNOSTIC STUDIES:  RECENT RADIOGRAPHIC STUDIES :  No results found.  RECENT VITAL SIGNS:   Patient Vitals for the past 24 hrs:  BP Temp Temp src  Pulse Resp SpO2 Height Weight  04/17/20 0530 -- -- -- -- -- 95 % -- --  04/17/20 0519 (!) 141/58 99 F (37.2 C) Oral 84 18 (!) 86 % -- --  04/17/20 0203 (!) 144/63 99.8 F (37.7 C) Oral 84 20 97 % -- --  04/16/20 2300 (!) 136/59 98.8 F (37.1 C) Oral 83 20 98 % -- --  04/16/20 1440 (!) 121/58 -- -- 73 16 100 % 5\' 1"  (1.549 m) 55.9 kg  04/16/20 1340 134/63 (!) 97.5 F (36.4 C) Oral 75 16 100 % -- --  04/16/20 1238 (!) 130/59 -- -- 64 16 99 % -- --  04/16/20 1140 (!) 130/57 97.8 F (36.6 C) Oral 67 14 99 % -- --  04/16/20 1040 136/66 (!) 97.2 F (36.2 C) Axillary 66 15 100 % -- --  04/16/20 1015 (!) 134/56 (!) 97.3 F (36.3 C) -- 70 14 100 % -- --  04/16/20 1000 121/64 (!) 97.3 F (36.3 C) -- 68 19 100 % -- --  04/16/20 0945 127/64 (!) 96.6 F (35.9 C) -- 69 (!) 21 100 % -- --  04/16/20 0930 110/72 (!) 96.8 F (36 C) -- 73 14 98 % -- --  04/16/20 0921 133/65 (!) 96.1 F (35.6 C) -- 76 16 100 % -- --  .  RECENT  EKG RESULTS:    Orders placed or performed during the hospital encounter of 04/13/20  . EKG 12 lead  . EKG 12 lead    DISCHARGE INSTRUCTIONS:    DISCHARGE MEDICATIONS:   Allergies as of 04/17/2020   No Known Allergies     Medication List    TAKE these medications   acetaminophen 500 MG tablet Commonly known as: TYLENOL Take 1,000 mg by mouth at bedtime.   amLODipine 10 MG tablet Commonly known as: NORVASC Take 1 tablet (10 mg total) by mouth daily.   B-COMPLEX PO Take 1 tablet by mouth daily. Super   Calcium 600-D 600-400 MG-UNIT Tabs Generic drug: Calcium Carbonate-Vitamin D3 Take 1 tablet by mouth daily.   cyclobenzaprine 10 MG tablet Commonly known as: FLEXERIL Take 1 tablet (10 mg total) by mouth 3 (three) times daily as needed for muscle spasms.   Fish Oil 1000 MG Caps Take 1,000 mg by mouth daily.   lamoTRIgine 25 MG tablet Commonly known as: Subvenite Take 3 tablets (75 mg total) by mouth daily.   MULTIVITAMIN PO Take 1 tablet by  mouth daily. Centrum silver   ondansetron 4 MG tablet Commonly known as: Zofran Take 1 tablet (4 mg total) by mouth every 8 (eight) hours as needed for nausea or vomiting.   oxyCODONE-acetaminophen 5-325 MG tablet Commonly known as: Percocet Take 1 tablet by mouth every 4 (four) hours as needed (max 6 q).   Premarin vaginal cream Generic drug: conjugated estrogens Place 1/2 gram per vagina at bedtime twice weekly. What changed:   how much to take  how to take this  when to take this  additional instructions   Systane Nighttime Oint Place 1 application into both eyes at bedtime as needed (Dry eye).   SYSTANE ULTRA OP Place 1 drop into both eyes in the morning and at bedtime.   Vitamin D3 50 MCG (2000 UT) Tabs Take 2,000 mg by mouth daily.       FOLLOW UP VISIT:   Follow-up Information    Justice Britain, MD.   Specialty: Orthopedic Surgery Why: in 10-14 d ays Contact information: [address] [place] [address] [phone]           DISCHARGE TO: Home   DISCHARGE CONDITION:  Thereasa Parkin Daiki Dicostanzo for Dr. Justice Britain 04/17/2020, 8:33 AM

## 2020-04-17 NOTE — Evaluation (Signed)
Occupational Therapy Evaluation Patient Details Name: Christine Scott MRN: [id number] DOB: [birth year] Today's Date: 04/17/2020    History of Present Illness Patient is a 79 year old female s/p left reverse total shoulder arthropalsty.    Clinical Impression   Education provided (listed below) with patient return demo of compensatory strategies and home exercise program. Patient's friend present for all education and plans on staying with patient for few days to assist as needed.     Follow Up Recommendations  Follow surgeon's recommendation for DC plan and follow-up therapies    Equipment Recommendations  None recommended by OT       Precautions / Restrictions Precautions Precautions: Shoulder Type of Shoulder Precautions: ok pendulums and lap slides, PROM AAROM and AROM within pain tolerance FF 60 ER 20 ABD 45 for self care, distal AROM elbow wrist and hand ok Shoulder Interventions: Shoulder sling/immobilizer;Off for dressing/bathing/exercises Precaution Booklet Issued: Yes (comment) Required Braces or Orthoses: Sling Restrictions Weight Bearing Restrictions: Yes LUE Weight Bearing: Non weight bearing      Mobility Bed Mobility Overal bed mobility: Modified Independent             General bed mobility comments: HOB elevated  Transfers Overall transfer level: Needs assistance Equipment used: None Transfers: Sit to/from Stand Sit to Stand: Supervision         General transfer comment: S for safety    Balance Overall balance assessment: Mild deficits observed, not formally tested                                         ADL either performed or assessed with clinical judgement   ADL Overall ADL's : Needs assistance/impaired     Grooming: Set up;Sitting   Upper Body Bathing: Minimal assistance;Sitting   Lower Body Bathing: Min guard;Sit to/from stand   Upper Body Dressing : Minimal assistance;Sitting;Cueing for UE precautions;Cueing for  compensatory techniques;Cueing for sequencing Upper Body Dressing Details (indicate cue type and reason): assist to button up shirt only, cues for sequencing compensatory strategies Lower Body Dressing: Min guard;Sit to/from stand Lower Body Dressing Details (indicate cue type and reason): pt able to thread underwear and pants, min guard assist in standing for safety Toilet Transfer: Supervision/safety;Regular Glass blower/designer Details (indicate cue type and reason): simulated with chair transfer         Functional mobility during ADLs: Supervision/safety;Min guard                    Pertinent Vitals/Pain Pain Assessment: Faces Faces Pain Scale: Hurts little more Pain Location: L shoulder Pain Descriptors / Indicators: Aching;Discomfort;Grimacing Pain Intervention(s): Monitored during session     Hand Dominance Right   Extremity/Trunk Assessment Upper Extremity Assessment Upper Extremity Assessment: LUE deficits/detail LUE: Unable to fully assess due to immobilization LUE Sensation: WNL       Cervical / Trunk Assessment Cervical / Trunk Assessment: Normal   Communication Communication Communication: No difficulties   Cognition Arousal/Alertness: Awake/alert Behavior During Therapy: WFL for tasks assessed/performed Overall Cognitive Status: Within Functional Limits for tasks assessed                                        Exercises Exercises: Shoulder Shoulder Exercises Pendulum Exercise: Left;5 reps;Standing Shoulder Flexion: PROM;Left;5 reps;Seated Shoulder ABduction: PROM;5 reps;Left;Seated  Shoulder External Rotation: PROM;Left;5 reps;Seated Elbow Flexion: AROM;5 reps;Left;Seated Elbow Extension: AROM;Left;5 reps;Seated Wrist Flexion: AROM;Left;5 reps;Seated Wrist Extension: AROM;Left;5 reps;Seated Digit Composite Flexion: AROM;Left;5 reps;Seated Composite Extension: AROM;Left;5 reps;Seated   Shoulder Instructions Shoulder  Instructions Donning/doffing shirt without moving shoulder: Minimal assistance;Patient able to independently direct caregiver Method for sponge bathing under operated UE: Minimal assistance;Patient able to independently direct caregiver Donning/doffing sling/immobilizer: Patient able to independently direct caregiver Correct positioning of sling/immobilizer: Independent;Patient able to independently direct caregiver Pendulum exercises (written home exercise program): Minimal assistance;Patient able to independently direct caregiver ROM for elbow, wrist and digits of operated UE: Independent Sling wearing schedule (on at all times/off for ADL's): Independent;Patient able to independently direct caregiver Proper positioning of operated UE when showering: Supervision/safety;Patient able to independently direct caregiver Positioning of UE while sleeping: Minimal assistance;Patient able to independently direct caregiver    Home Living Family/patient expects to be discharged to:: Private residence Living Arrangements: Alone Available Help at Discharge: Friend(s) Type of Home: Other(Comment)(Condo) Home Access: Elevator     Home Layout: One level     Bathroom Shower/Tub: Walk-in shower         Home Equipment: Shower seat - built in;Grab bars - tub/shower          Prior Functioning/Environment Level of Independence: Independent                 OT Problem List: Pain         OT Goals(Current goals can be found in the care plan section) Acute Rehab OT Goals Patient Stated Goal: return to independence OT Goal Formulation: With patient Time For Goal Achievement: 05/01/20 Potential to Achieve Goals: Good   AM-PAC OT "6 Clicks" Daily Activity     Outcome Measure Help from another person eating meals?: None Help from another person taking care of personal grooming?: A Little Help from another person toileting, which includes using toliet, bedpan, or urinal?: A Little Help from  another person bathing (including washing, rinsing, drying)?: A Little Help from another person to put on and taking off regular upper body clothing?: A Little Help from another person to put on and taking off regular lower body clothing?: A Little 6 Click Score: 19   End of Session Equipment Utilized During Treatment: (sling) Nurse Communication: Mobility status  Activity Tolerance: Patient tolerated treatment well Patient left: in chair;with call bell/phone within reach  OT Visit Diagnosis: Pain Pain - Right/Left: Left Pain - part of body: Shoulder                Time: 9147-8295 OT Time Calculation (min): 54 min Charges:  OT General Charges $OT Visit: 1 Visit OT Evaluation $OT Eval Moderate Complexity: 1 Mod  Marlyce Huge OT Pager: [phone]  Carmelia Roller 04/17/2020, 9:20 AM

## 2020-04-23 ENCOUNTER — Telehealth: Payer: Self-pay | Admitting: Family Medicine

## 2020-04-23 MED ORDER — LAMOTRIGINE 100 MG PO TABS: 100.0000 mg | ORAL_TABLET | Freq: Every day | ORAL | 1 refills | Status: DC

## 2020-04-23 NOTE — Telephone Encounter (Signed)
Sent in a 100mg  tab. Take ONE a day , MD [facility]

## 2020-04-23 NOTE — Telephone Encounter (Signed)
MEDICATION: lamoTRIgine (SUBVENITE) 100 MG tablet  PHARMACY:  [facility] [address], [place] - [address] [place] Phone:  [phone]  Fax:  [phone]       Comments: Patient stated she wanted 100mg  instead on the 25mg .        **Let patient know to contact pharmacy at the end of the day to make sure medication is ready. **  ** Please notify patient to allow 48-72 hours to process**  **Encourage patient to contact the pharmacy for refills or they can request refills through [facility]**

## 2020-04-23 NOTE — Telephone Encounter (Signed)
Patient requesting change in Rx

## 2020-04-23 NOTE — Telephone Encounter (Signed)
Called pt to make aware that medicine has been sent to pharmacy.

## 2020-06-12 ENCOUNTER — Other Ambulatory Visit: Payer: Self-pay | Admitting: Obstetrics and Gynecology

## 2020-06-12 DIAGNOSIS — Z1231 Encounter for screening mammogram for malignant neoplasm of breast: Secondary | ICD-10-CM

## 2020-07-15 ENCOUNTER — Other Ambulatory Visit: Payer: Self-pay

## 2020-07-15 ENCOUNTER — Ambulatory Visit
Admission: RE | Admit: 2020-07-15 | Discharge: 2020-07-15 | Disposition: A | Discharge status: Home / Self Care | Payer: Federal, State, Local not specified - PPO | Source: Ambulatory Visit | Attending: Obstetrics and Gynecology | Admitting: Obstetrics and Gynecology

## 2020-07-15 DIAGNOSIS — Z1231 Encounter for screening mammogram for malignant neoplasm of breast: Secondary | ICD-10-CM

## 2020-07-27 NOTE — Progress Notes (Signed)
79 y.o. G52P2004 Widowed Caucasian female here for annual exam.    States she is becoming forgetful.  She states she is feeling depressed, worse since her husband passed away.  Takes Lamotrigine.   States she is not suicidal.  She has been wanting to make a change in her psychiatrist.   Taking Prolia for osteoporosis.   Using Premarin vaginal cream to treat vaginal dryness.  Even urinating can cause burning.   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   PCP:   Orland Mustard, MD  No LMP recorded (lmp unknown). Patient has had a hysterectomy.           Sexually active: No.  The current method of family planning is status post hysterectomy--ovaries remain.    Exercising: Yes.    walks 2 miles most days. goes to the [facility] 2 days/week Smoker:  no  Health Maintenance: Pap: 04-27-18 Neg, 04-13-16 Neg, 2002 Neg History of abnormal Pap:  Yes, hx abnormal paps in her 30s and the reason for her hysterectomy MMG: 07-15-20 3D/Neg/density C/BiRads1 Colonoscopy:  2018 normal per patient--no need for further testing  BMD:  12-2-20Result :Osteopenia multiple sites--Prolia with Rheumatology--Hx Osteoporosis TDaP: PCP Gardasil:   no HIV: 04-27-18 Neg Hep C: 04-27-18 Neg Screening Labs:   PCP.    reports that she has never smoked. She has never used smokeless tobacco. She reports previous alcohol use of about 1.0 standard drink of alcohol per week. She reports that she does not use drugs.  Past Medical History:  Diagnosis Date  . Abnormal Pap smear of cervix    --hx abnormal paps in her 30s and per patient this was reason for her Hysterectomy  . Anxiety   . Depression   . Dry eye   . Hypertension   . Osteoarthritis    hands  . Osteoporosis   . Polymyalgia rheumatica (HCC)     Past Surgical History:  Procedure Laterality Date  . ABDOMINAL HYSTERECTOMY    . BREAST SURGERY  2016   benign mass removed in [place], [place].  Marland Kitchen FACIAL COSMETIC SURGERY    . REVERSE SHOULDER ARTHROPLASTY Left 04/16/2020    Procedure: REVERSE SHOULDER ARTHROPLASTY;  Surgeon: Francena Hanly, MD;  Location: [facility];  Service: Orthopedics;  Laterality: Left;   . TOTAL VAGINAL HYSTERECTOMY  age 24   --due to abnormal pap smears per patient    Current Outpatient Medications  Medication Sig Dispense Refill  . acetaminophen (TYLENOL) 500 MG tablet Take 1,000 mg by mouth at bedtime.    Marland Kitchen amLODipine (NORVASC) 10 MG tablet Take 1 tablet (10 mg total) by mouth daily. 90 tablet 1  . B Complex-Biotin-FA (B-COMPLEX PO) Take 1 tablet by mouth daily. Super    . Calcium Carbonate-Vitamin D3 (CALCIUM 600-D) 600-400 MG-UNIT TABS Take 1 tablet by mouth daily.     . Cholecalciferol (VITAMIN D3) 50 MCG (2000 UT) TABS Take 2,000 mg by mouth daily.    Marland Kitchen conjugated estrogens (PREMARIN) vaginal cream Place 1/2 gram per vagina at bedtime twice weekly. (Patient taking differently: Place 1 Applicatorful vaginally 2 (two) times a week. Place 1/2 gram) 30 g 1  . lamoTRIgine (SUBVENITE) 100 MG tablet Take 1 tablet (100 mg total) by mouth daily. 90 tablet 1  . Multiple Vitamins-Minerals (MULTIVITAMIN PO) Take 1 tablet by mouth daily. Centrum silver    . Omega-3 Fatty Acids (FISH OIL) 1000 MG CAPS Take 1,000 mg by mouth daily.     Bertram Gala Glycol-Propyl Glycol (SYSTANE ULTRA  OP) Place 1 drop into both eyes in the morning and at bedtime.     Cliffton Asters Petrolatum-Mineral Oil (SYSTANE NIGHTTIME) OINT Place 1 application into both eyes at bedtime as needed (Dry eye).      No current facility-administered medications for this visit.    Family History  Problem Relation Age of Onset  . Congestive Heart Failure Mother   . Stroke Mother   . Diabetes Father   . Colon cancer Brother   . Diabetes Brother   . Diabetes Brother   . Stroke Brother   . Colon cancer Daughter     Review of Systems  Neurological:       Memory issues  All other systems reviewed and are negative.   Exam:   BP 128/70   Pulse 70   Resp 16   Ht 5' 0.5"  (1.537 m)   Wt 125 lb 9.6 oz (57 kg)   LMP  (LMP Unknown)   BMI 24.13 kg/m     General appearance: alert, cooperative and appears stated age Head: normocephalic, without obvious abnormality, atraumatic Neck: no adenopathy, supple, symmetrical, trachea midline and thyroid normal to inspection and palpation Lungs: clear to auscultation bilaterally Breasts: normal appearance, no masses or tenderness, No nipple retraction or dimpling, No nipple discharge or bleeding, No axillary adenopathy Heart: regular rate and rhythm Abdomen: soft, non-tender; no masses, no organomegaly Extremities: extremities normal, atraumatic, no cyanosis or edema Skin: skin color, texture, turgor normal. No rashes or lesions Lymph nodes: cervical, supraclavicular, and axillary nodes normal. Neurologic: grossly normal  Pelvic: External genitalia:  no lesions              No abnormal inguinal nodes palpated.              Urethra:  normal appearing urethra with no masses, tenderness or lesions              Bartholins and Skenes: normal                 Vagina: normal appearing vagina with normal color and discharge, no lesions              Cervix:  absent              Pap taken: No. Bimanual Exam:  Uterus:  absent              Adnexa: no mass, fullness, tenderness              Rectal exam: Yes.  .  Confirms.              Anus:  normal sphincter tone, no lesions  Chaperone was present for exam.  Assessment:   Well woman visit with normal exam. Status post TVH for abnormal paps in her 30s.  Ovaries remain.  Vaginal atrophy.  Osteopenia.  On Prolia.  Depression.   Plan: Mammogram screening discussed. Self breast awareness reviewed. Pap and HR HPV as above. Guidelines for Calcium, Vitamin D, regular exercise program including cardiovascular and weight bearing exercise. BMD in 2022.  Refill of Premarin vagina cream.  I discussed potential effect on breast cancer.  She will follow up with her PCP regarding her  depression and memory changes.  I suggested she may benefit from a referral to neurology.  Follow up annually and prn.   After visit summary provided.

## 2020-07-28 ENCOUNTER — Ambulatory Visit: Payer: Federal, State, Local not specified - PPO | Admitting: Obstetrics and Gynecology

## 2020-07-28 ENCOUNTER — Encounter: Payer: Self-pay | Admitting: Obstetrics and Gynecology

## 2020-07-28 ENCOUNTER — Other Ambulatory Visit: Payer: Self-pay

## 2020-07-28 VITALS — BP 128/70 | HR 70 | Resp 16 | Ht 60.5 in | Wt 125.6 lb

## 2020-07-28 DIAGNOSIS — Z01419 Encounter for gynecological examination (general) (routine) without abnormal findings: Secondary | ICD-10-CM

## 2020-07-28 MED ORDER — PREMARIN 0.625 MG/GM VA CREA: TOPICAL_CREAM | VAGINAL | 1 refills | Status: DC

## 2020-07-28 NOTE — Patient Instructions (Signed)

## 2020-08-14 NOTE — Progress Notes (Signed)
Office Visit Note  Patient: Christine Scott             Date of Birth: 06/24/1941           MRN: [id number]             PCP: Orland Mustard, MD Referring: Orland Mustard, MD Visit Date: 08/27/2020 Occupation: @GUAROCC @  Subjective:  Pain in both feet  History of Present Illness: Christine Scott is a 79 y.o. female with history of polymyalgia rheumatica, osteoarthritis and degenerative disc disease.  She denies any increased muscle pain or weakness.  She states she has been experiencing increased pain and discomfort in her feet.  She continues to have some lower back pain especially on her right side without any radiculopathy.  She has been getting her Prolia from her PCP for osteoporosis.  Patient states that she has been experiencing shortness of breath.  She would like to be referred to pulmonologist.  She is also concerned about memory loss.  She states has been going on for a while and getting worse.  She would like a neurology referral.  Activities of Daily Living:  Patient reports morning stiffness for 30 minutes.   Patient Reports nocturnal pain.  Difficulty dressing/grooming: Denies Difficulty climbing stairs: Denies Difficulty getting out of chair: Denies Difficulty using hands for taps, buttons, cutlery, and/or writing: Denies  Review of Systems  Constitutional: Positive for fatigue. Negative for night sweats, weight gain and weight loss.  HENT: Positive for mouth dryness and nose dryness. Negative for mouth sores, trouble swallowing and trouble swallowing.   Eyes: Positive for dryness. Negative for pain, redness and visual disturbance.  Respiratory: Positive for shortness of breath. Negative for cough and difficulty breathing.   Cardiovascular: Negative for chest pain, palpitations, hypertension, irregular heartbeat and swelling in legs/feet.  Gastrointestinal: Negative for blood in stool, constipation and diarrhea.  Endocrine: Negative for increased urination.  Genitourinary:  Negative for difficulty urinating and vaginal dryness.  Musculoskeletal: Positive for arthralgias, joint pain, muscle weakness and morning stiffness. Negative for joint swelling, myalgias, muscle tenderness and myalgias.  Skin: Negative for color change, rash, hair loss, redness, skin tightness, ulcers and sensitivity to sunlight.  Allergic/Immunologic: Negative for susceptible to infections.  Neurological: Positive for memory loss. Negative for dizziness, headaches, night sweats and weakness.  Hematological: Negative for swollen glands.  Psychiatric/Behavioral: Positive for confusion. Negative for depressed mood and sleep disturbance. The patient is not nervous/anxious.     PMFS History:  Patient Active Problem List   Diagnosis Date Noted  . S/P reverse total shoulder arthroplasty, 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  . Greater trochanteric bursitis of left hip 07/04/2019  . DDD (degenerative disc disease), lumbar 02/25/2019  . Bipolar II disorder (HCC) 12/07/2018  . 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 History of depression 08/12/2017  . Essential hypertension 08/12/2017  . Lumbar radiculopathy 06/15/2017  . Sjogren's syndrome 12/25/2016  . 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 Primary osteoarthritis of both feet 12/25/2016  . Osteoporosis 12/25/2016    Past Medical History:  Diagnosis Date  . Abnormal Pap smear of cervix    --hx abnormal paps in her 30s and per patient this was reason for her Hysterectomy  . Anxiety   . Depression   . Dry eye   . Hypertension   . Osteoarthritis    hands  . Osteoporosis   . Polymyalgia rheumatica (HCC)     Family History  Problem Relation  Age of Onset  . Congestive Heart Failure Mother   . Stroke Mother   . Diabetes Father   . Colon cancer Brother   . Diabetes Brother   . Diabetes Brother   . Stroke Brother   . Colon cancer Daughter    Past Surgical History:  Procedure Laterality  Date  . ABDOMINAL HYSTERECTOMY    . BREAST SURGERY  2016   benign mass removed in [place], [place].  Marland Kitchen FACIAL COSMETIC SURGERY    . REVERSE SHOULDER ARTHROPLASTY Left 04/16/2020   Procedure: REVERSE SHOULDER ARTHROPLASTY;  Surgeon: Francena Hanly, MD;  Location: [facility];  Service: Orthopedics;  Laterality: Left;   . TOTAL VAGINAL HYSTERECTOMY  age 28   --due to abnormal pap smears per patient   Social History   Social History Narrative  . Not on file   Immunization History  Administered Date(s) Administered  . Fluad Quad(high Dose 65+) 08/28/2019  . Influenza, High Dose Seasonal PF 10/21/2017, 11/27/2018  . Influenza-Unspecified 10/13/2016  . PFIZER SARS-COV-2 Vaccination 01/12/2020, 02/05/2020  . Pneumococcal Conjugate-13 08/17/2018  . Pneumococcal Polysaccharide-23 01/19/2016  . Tdap 12/26/2009     Objective: Vital Signs: BP 116/68 (BP Location: Left Arm, Patient Position: Sitting, Cuff Size: Normal)   Pulse 71   Resp 13   Ht 5\' 1"  (1.549 m)   Wt 126 lb 9.6 oz (57.4 kg)   LMP  (LMP Unknown)   BMI 23.92 kg/m    Physical Exam Vitals and nursing note reviewed.  Constitutional:      Appearance: She is well-developed.  HENT:     Head: Normocephalic and atraumatic.  Eyes:     Conjunctiva/sclera: Conjunctivae normal.  Cardiovascular:     Rate and Rhythm: Normal rate and regular rhythm.     Heart sounds: Normal heart sounds.  Pulmonary:     Effort: Pulmonary effort is normal.     Breath sounds: Normal breath sounds.  Abdominal:     General: Bowel sounds are normal.     Palpations: Abdomen is soft.  Musculoskeletal:     Cervical back: Normal range of motion.  Lymphadenopathy:     Cervical: No cervical adenopathy.  Skin:    General: Skin is warm and dry.     Capillary Refill: Capillary refill takes less than 2 seconds.  Neurological:     Mental Status: She is alert and oriented to person, place, and time.  Psychiatric:        Behavior: Behavior normal.       Musculoskeletal Exam: C-spine was in good range of motion.  She has limited painful range of motion of her lumbar spine.  Shoulder joints, elbow joints, wrist joints with good range of motion.  She has PIP and DIP thickening.  Hip joints, knee joints, ankles with good range of motion.  She has bilateral PIP DIP thickening and dorsal spurring.  No synovitis was noted.  CDAI Exam: CDAI Score: -- Patient Global: --; Provider Global: -- Swollen: --; Tender: -- Joint Exam 08/27/2020   No joint exam has been documented for this visit   There is currently no information documented on the homunculus. Go to the Rheumatology activity and complete the homunculus joint exam.  Investigation: No additional findings.  Imaging: No results found.  Recent Labs: Lab Results  Component Value Date   WBC 4.7 04/13/2020   HGB 11.8 (L) 04/13/2020   PLT 191 04/13/2020   NA 138 04/13/2020   K 3.9 04/13/2020   CL 99 04/13/2020  CO2 29 04/13/2020   GLUCOSE 87 04/13/2020   BUN 17 04/13/2020   CREATININE 0.72 04/13/2020   BILITOT 0.3 02/27/2020   ALKPHOS 46 02/27/2020   AST 21 02/27/2020   ALT 12 02/27/2020   PROT 6.6 02/27/2020   ALBUMIN 3.9 02/27/2020   CALCIUM 9.4 04/13/2020   GFRAA >60 04/13/2020    Speciality Comments: Osteoporosis managed by PCP.  Procedures:  No procedures performed Allergies: Patient has no known allergies.   Assessment / Plan:     Visit Diagnoses: Polymyalgia rheumatica (HCC) - her PMR is under remission.  She had no increased muscle weakness or tenderness.  Sjogren's syndrome-she has sicca symptoms which are manageable with over-the-counter medications.  Primary osteoarthritis of both hands-joint protection muscle strengthening was discussed.  Primary osteoarthritis of both feet-she has hammertoes and dorsal spurring.  Proper fitting shoes with arch support were discussed.  DDD (degenerative disc disease), lumbar-she has lower back pain without radiculopathy.   Core strengthening exercises and back exercises were discussed.  Shortness of breath -patient has been experiencing shortness of breath.  She requests referral to pulmonary.  I offered getting chest x-ray but she declined.  She states she would prefer to do it in a office setting.  Plan: Ambulatory referral to Pulmonology  Memory loss -she is concerned about memory loss which has been going on for a while.  We will make a referral to neurology.  Plan: Ambulatory referral to Neurology  Osteoporosis - She has a history of osteoporosis that has improved on Prolia.  She has been on prolia for 3-4 years.  Her osteoporosis has been managed by her PCP.  History of depression  History of hypertension  Orders: Orders Placed This Encounter  Procedures  . Ambulatory referral to Pulmonology  . Ambulatory referral to Neurology   No orders of the defined types were placed in this encounter.    Follow-Up Instructions: Return in about 6 months (around 02/24/2021) for Osteoarthritis, PMR.   Pollyann Savoy, MD  Note - This record has been created using Animal nutritionist.  Chart creation errors have been sought, but may not always  have been located. Such creation errors do not reflect on  the standard of medical care.

## 2020-08-27 ENCOUNTER — Encounter: Payer: Self-pay | Admitting: Rheumatology

## 2020-08-27 ENCOUNTER — Ambulatory Visit: Payer: Federal, State, Local not specified - PPO | Admitting: Rheumatology

## 2020-08-27 ENCOUNTER — Other Ambulatory Visit: Payer: Self-pay

## 2020-08-27 VITALS — BP 116/68 | HR 71 | Resp 13 | Ht 61.0 in | Wt 126.6 lb

## 2020-08-27 DIAGNOSIS — M19071 Primary osteoarthritis, right ankle and foot: Secondary | ICD-10-CM

## 2020-08-27 DIAGNOSIS — M810 Age-related osteoporosis without current pathological fracture: Secondary | ICD-10-CM

## 2020-08-27 DIAGNOSIS — M19041 Primary osteoarthritis, right hand: Secondary | ICD-10-CM | POA: Diagnosis not present

## 2020-08-27 DIAGNOSIS — M3501 Sicca syndrome with keratoconjunctivitis: Secondary | ICD-10-CM | POA: Diagnosis not present

## 2020-08-27 DIAGNOSIS — Z8659 Personal history of other mental and behavioral disorders: Secondary | ICD-10-CM

## 2020-08-27 DIAGNOSIS — M7062 Trochanteric bursitis, left hip: Secondary | ICD-10-CM

## 2020-08-27 DIAGNOSIS — R413 Other amnesia: Secondary | ICD-10-CM

## 2020-08-27 DIAGNOSIS — M19072 Primary osteoarthritis, left ankle and foot: Secondary | ICD-10-CM

## 2020-08-27 DIAGNOSIS — M19042 Primary osteoarthritis, left hand: Secondary | ICD-10-CM

## 2020-08-27 DIAGNOSIS — M353 Polymyalgia rheumatica: Secondary | ICD-10-CM | POA: Diagnosis not present

## 2020-08-27 DIAGNOSIS — R0602 Shortness of breath: Secondary | ICD-10-CM

## 2020-08-27 DIAGNOSIS — Z8679 Personal history of other diseases of the circulatory system: Secondary | ICD-10-CM

## 2020-08-27 DIAGNOSIS — M5136 Other intervertebral disc degeneration, lumbar region: Secondary | ICD-10-CM

## 2020-08-27 NOTE — Patient Instructions (Signed)

## 2020-08-28 ENCOUNTER — Telehealth: Payer: Self-pay

## 2020-08-28 NOTE — Telephone Encounter (Signed)
Prior auth for prolia approved   07/28/20 - 08/27/21

## 2020-08-28 NOTE — Telephone Encounter (Signed)
Prior Authorization request has been approved for Prolia. Approval Letter mailed to patient.  Prior Auth valid 07/28/2020-08/27/2021.

## 2020-09-02 ENCOUNTER — Encounter: Payer: Self-pay | Admitting: Family Medicine

## 2020-09-02 ENCOUNTER — Other Ambulatory Visit: Payer: Self-pay

## 2020-09-02 ENCOUNTER — Ambulatory Visit: Payer: Federal, State, Local not specified - PPO | Admitting: Family Medicine

## 2020-09-02 VITALS — BP 119/73 | HR 73 | Temp 98.1°F | Ht 61.0 in | Wt 125.0 lb

## 2020-09-02 DIAGNOSIS — R002 Palpitations: Secondary | ICD-10-CM

## 2020-09-02 DIAGNOSIS — I10 Essential (primary) hypertension: Secondary | ICD-10-CM

## 2020-09-02 DIAGNOSIS — R0602 Shortness of breath: Secondary | ICD-10-CM

## 2020-09-02 DIAGNOSIS — M810 Age-related osteoporosis without current pathological fracture: Secondary | ICD-10-CM | POA: Diagnosis not present

## 2020-09-02 DIAGNOSIS — M19071 Primary osteoarthritis, right ankle and foot: Secondary | ICD-10-CM | POA: Diagnosis not present

## 2020-09-02 DIAGNOSIS — M19072 Primary osteoarthritis, left ankle and foot: Secondary | ICD-10-CM

## 2020-09-02 MED ORDER — DENOSUMAB 60 MG/ML ~~LOC~~ SOSY
60.0000 mg | PREFILLED_SYRINGE | Freq: Once | SUBCUTANEOUS | Status: AC
  Administered 2020-09-02: 60 mg via SUBCUTANEOUS

## 2020-09-02 NOTE — Progress Notes (Signed)
Per orders of Dr. Artis Flock, injection of Prolia given by Yolanda Manges in right arm. Patient tolerated injection well. Patient will make appointment for 6 month. She will be due 03/03/2021

## 2020-09-02 NOTE — Progress Notes (Signed)
Patient: Christine Scott MRN: [id number] DOB: [birth year] PCP: Orland Mustard, MD     Subjective:  Chief Complaint  Patient presents with  . Hypertension  . Shortness of Breath  . Palpitations  . Foot Pain    HPI: The patient is a 79 y.o. female who presents today for Hypertension. Pt was seen by rheumatologist, and he suggests that she be referred to a podiatrist due to foot pain. Known OA in bilateral big toes.   Hypertension: Here for follow up of hypertension.  Currently on amlodipine 10mg /day . Home readings range from 130-140 systolic/60-70 diastolic. Takes medication as prescribed and denies any side effects. Exercise includes walking. Weight has been stable. Denies any chest pain, headaches, shortness of breath, vision changes, swelling in lower extremities.   Shortness of breath with exertion She feels like this has been slowly progressively since I saw her last. She can sometimes have this at home just getting up at home. She will feel tired and want to sit back down and it's hard to breath. She states she can't breath deep. She denies any coughing or leg swelling. She feels like she has to take more frequent breaks with walking. Denies any chest pain, but does have palpitations with the shortness of breath. She denies any diaphoresis with this. No orthopnea. She does not smoke and no known environmental risk factors/exposures.  The rheumatologist did a referral to pulm as well.   She also has complaints of memory loss and referral to rheumatology was also placed.   Due for prolia injection   Review of Systems  Constitutional: Negative for chills, fatigue and fever.  HENT: Negative for dental problem, ear pain, hearing loss and trouble swallowing.   Eyes: Negative for visual disturbance.  Respiratory: Positive for shortness of breath. Negative for cough, chest tightness and wheezing.   Cardiovascular: Positive for palpitations. Negative for chest pain and leg swelling.   Gastrointestinal: Negative for abdominal pain, blood in stool, diarrhea and nausea.  Endocrine: Negative for cold intolerance, polydipsia, polyphagia and polyuria.  Genitourinary: Negative for dysuria and hematuria.  Musculoskeletal: Positive for arthralgias. Negative for joint swelling.  Skin: Negative for rash.  Neurological: Negative for dizziness and headaches.  Psychiatric/Behavioral: Negative for dysphoric mood and sleep disturbance. The patient is not nervous/anxious.     Allergies Patient has No Known Allergies.  Past Medical History Patient  has a past medical history of Abnormal Pap smear of cervix, Anxiety, Depression, Dry eye, Hypertension, Osteoarthritis, Osteoporosis, and Polymyalgia rheumatica (HCC).  Surgical History Patient  has a past surgical history that includes Facial cosmetic surgery; Total vaginal hysterectomy (age 44); Breast surgery (2016); Abdominal hysterectomy; and Reverse shoulder arthroplasty (Left, 04/16/2020).  Family History Pateint's family history includes Colon cancer in her brother and daughter; Congestive Heart Failure in her mother; Diabetes in her brother, brother, and father; Stroke in her brother and mother.  Social History Patient  reports that she has never smoked. She has never used smokeless tobacco. She reports previous alcohol use. She reports that she does not use drugs.    Objective: Vitals:   09/02/20 0855  BP: 119/73  Pulse: 73  Temp: 98.1 F (36.7 C)  TempSrc: Temporal  SpO2: 98%  Weight: 125 lb (56.7 kg)  Height: 5\' 1"  (1.549 m)    Body mass index is 23.62 kg/m.  Physical Exam Vitals reviewed.  Constitutional:      Appearance: She is well-developed and normal weight.  HENT:     Head: Normocephalic and  atraumatic.     Right Ear: External ear normal.     Left Ear: External ear normal.     Mouth/Throat:     Mouth: Mucous membranes are moist.  Eyes:     Extraocular Movements: Extraocular movements intact.      Conjunctiva/sclera: Conjunctivae normal.     Pupils: Pupils are equal, round, and reactive to light.  Neck:     Thyroid: No thyromegaly.  Cardiovascular:     Rate and Rhythm: Normal rate and regular rhythm.     Heart sounds: Normal heart sounds. No murmur heard.   Pulmonary:     Effort: Pulmonary effort is normal.     Breath sounds: Normal breath sounds. No decreased breath sounds.  Abdominal:     General: Bowel sounds are normal. There is no distension.     Palpations: Abdomen is soft.     Tenderness: There is no abdominal tenderness.  Musculoskeletal:     Cervical back: Normal range of motion and neck supple.  Lymphadenopathy:     Cervical: No cervical adenopathy.  Skin:    General: Skin is warm and dry.     Findings: No rash.  Neurological:     General: No focal deficit present.     Mental Status: She is alert and oriented to person, place, and time.     Cranial Nerves: No cranial nerve deficit.     Coordination: Coordination normal.     Deep Tendon Reflexes: Reflexes normal.  Psychiatric:        Mood and Affect: Mood normal.        Behavior: Behavior normal.    EKG: NSR with rate of 70. ?anteroseptal infarct. Age undetermined. Similar to prior EKG.      Assessment/plan: 1. Essential hypertension Blood pressure is to goal. Continue current anti-hypertensive medication per hpi. Refills not given and routine lab work will be done today. Recommended routine exercise and healthy diet including DASH diet and mediterranean diet. Encouraged weight loss. F/u in 6 months.   - CBC with Differential/Platelet; Future - Comprehensive metabolic panel; Future  2. Shortness of breath No red flags on exam and resting comfortably. ekg at baseline, checking labs/CXR. Referral to pulm already done.  - DG Chest 2 View; Future - Brain natriuretic peptide; Future  3. Palpitations -ekg at baseline. Checking CXR/labs. May need zio and will get other stuff back first.  - EKG 12-Lead;  Future - TSH; Future  4. Primary osteoarthritis of both feet  - Ambulatory referral to Orthopedic Surgery  5. Osteoporosis prolia shot today.      This visit occurred during the SARS-CoV-2 public health emergency.  Safety protocols were in place, including screening questions prior to the visit, additional usage of staff PPE, and extensive cleaning of exam room while observing appropriate contact time as indicated for disinfecting solutions.     Return in about 6 months (around 03/02/2021) for htn/prolia/.   Orland Mustard, MD [facility]   09/02/2020

## 2020-09-02 NOTE — Patient Instructions (Addendum)
1) for shortness of breath. Please get chest xray at our [facility].  I have ordered xrays for you. At this time we do not have xrays in our clinic. You will have to go to our [facility]. The address is [address]  xray is located in the basement.  Hours of operation are M-F 8:30am to 5:00pm.  Closed for lunch between 12:30 and 1:00pm.   Also checking labs/ekg for this and it looks like your rheumatologist put in referral for lung doctor.   2) blood pressure is excellent. Routine labs today.   3) prolia shot today  4) memory loss Referral already placed for neurologist.   5) osteoarthritis in feet: referral placed  Dr. Lajoyce Corners who is our orthopedic surgeon who specialized in the foot.

## 2020-09-03 ENCOUNTER — Ambulatory Visit: Payer: Federal, State, Local not specified - PPO | Admitting: Family Medicine

## 2020-09-21 ENCOUNTER — Ambulatory Visit (INDEPENDENT_AMBULATORY_CARE_PROVIDER_SITE_OTHER): Payer: Federal, State, Local not specified - PPO | Admitting: Orthopedic Surgery

## 2020-09-21 ENCOUNTER — Encounter: Payer: Self-pay | Admitting: Orthopedic Surgery

## 2020-09-21 VITALS — Ht 61.0 in | Wt 120.0 lb

## 2020-09-21 DIAGNOSIS — M25571 Pain in right ankle and joints of right foot: Secondary | ICD-10-CM | POA: Diagnosis not present

## 2020-09-21 DIAGNOSIS — M6701 Short Achilles tendon (acquired), right ankle: Secondary | ICD-10-CM

## 2020-09-21 NOTE — Progress Notes (Signed)
Office Visit Note   Patient: Christine Scott           Date of Birth: 07-14-1941           MRN: [id number] Visit Date: 09/21/2020              Requested by: Orland Mustard, MD [address] [place],  [place] [address] PCP: Orland Mustard, MD  Chief Complaint  Patient presents with  . Right Foot - Pain  . Left Foot - Pain      HPI: Patient is an 78 year old woman who presents complaining of lateral foot pain on the right worse than the left with pain around the area of the base of the fifth metatarsal.  She has pain with weightbearing has had no previous surgeries has been using Tylenol for pain she states the pain wakes her up at night complains of swelling.  Patient states she has had a history of polymyalgia rheumatica and is currently not taking any prednisone.  Assessment & Plan: Visit Diagnoses:  1. Achilles tendon contracture, right   2. Pain in right ankle and joints of right foot     Plan: Patient was given instructions and demonstrated Achilles stretching to help unload the lateral column also recommended topical Voltaren gel as well as [place]oka sneakers.  This was also provided in a written prescription for her.  Follow-Up Instructions: Return in about 4 weeks (around 10/19/2020).   Ortho Exam  Patient is alert, oriented, no adenopathy, well-dressed, normal affect, normal respiratory effort. Examination patient has good pulses she has tenderness to palpation over the distal aspect of the peroneus brevis resisted eversion is painful she has pain to palpation of the base of the fifth metatarsal as well as pain to palpation over the fifth metatarsal cuboid joint.  She does have Achilles contracture and only has dorsiflexion to neutral with her knee extended of the right ankle.  There are some varicose veins but no brawny skin color changes no significant swelling.  Radiographs were reviewed which shows some arthritic changes of the IP joint of the left great toe but no  specific radiographic changes on the right foot with radiographs obtained in March 2021.  Imaging: No results found. No images are attached to the encounter.  Labs: Lab Results  Component Value Date   ESRSEDRATE 14 02/25/2019   ESRSEDRATE 17 02/15/2018   CRP <1.0 08/28/2019   LABORGA Multiple bacterial morphotypes present, none 04/13/2016   LABORGA predominant. Suggest appropriate recollection if  04/13/2016   LABORGA clinically indicated. 04/13/2016     Lab Results  Component Value Date   ALBUMIN 3.9 02/27/2020   ALBUMIN 4.4 08/28/2019   ALBUMIN 4.0 05/13/2019    No results found for: MG Lab Results  Component Value Date   VD25OH 63 02/25/2019   VD25OH 84.77 08/17/2018   VD25OH 38 08/15/2017    No results found for: PREALBUMIN CBC EXTENDED Latest Ref Rng & Units 04/13/2020 02/27/2020 08/28/2019  WBC 4.0 - 10.5 K/uL 4.7 5.7 6.9  RBC 3.87 - 5.11 MIL/uL 3.87 3.64(L) 4.03  HGB 12.0 - 15.0 g/dL 11.8(L) 11.2(L) 12.3  HCT 36 - 46 % 36.8 33.4(L) 37.0  PLT 150 - 400 K/uL 191 185.0 214.0  NEUTROABS 1.4 - 7.7 K/uL - 4.1 5.4  LYMPHSABS 0.7 - 4.0 K/uL - 1.1 1.0     Body mass index is 22.67 kg/m.  Orders:  No orders of the defined types were placed in this encounter.  No orders  of the defined types were placed in this encounter.    Procedures: No procedures performed  Clinical Data: No additional findings.  ROS:  All other systems negative, except as noted in the HPI. Review of Systems  Objective: Vital Signs: Ht 5\' 1"  (1.549 m)   Wt 120 lb (54.4 kg)   LMP  (LMP Unknown)   BMI 22.67 kg/m   Specialty Comments:  No specialty comments available.  PMFS History: Patient Active Problem List   Diagnosis Date Noted  . S/P reverse total shoulder arthroplasty, 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  . Greater trochanteric bursitis of left hip 07/04/2019  . DDD (degenerative disc disease), lumbar 02/25/2019  . Bipolar II disorder (HCC) 12/07/2018  . Left rotator cuff tear arthropathy  12/06/2018  . History of depression 08/12/2017  . Essential hypertension 08/12/2017  . Lumbar radiculopathy 06/15/2017  . Sjogren's syndrome 12/25/2016  . 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 Primary osteoarthritis of both feet 12/25/2016  . Osteoporosis 12/25/2016   Past Medical History:  Diagnosis Date  . Abnormal Pap smear of cervix    --hx abnormal paps in her 30s and per patient this was reason for her Hysterectomy  . Anxiety   . Depression   . Dry eye   . Hypertension   . Osteoarthritis    hands  . Osteoporosis   . Polymyalgia rheumatica (HCC)     Family History  Problem Relation Age of Onset  . Congestive Heart Failure Mother   . Stroke Mother   . Diabetes Father   . Colon cancer Brother   . Diabetes Brother   . Diabetes Brother   . Stroke Brother   . Colon cancer Daughter     Past Surgical History:  Procedure Laterality Date  . ABDOMINAL HYSTERECTOMY    . BREAST SURGERY  2016   benign mass removed in [place], [place].  [place] FACIAL COSMETIC SURGERY    . REVERSE SHOULDER ARTHROPLASTY Left 04/16/2020   Procedure: REVERSE SHOULDER ARTHROPLASTY;  Surgeon: 04/18/2020, MD;  Location: [facility];  Service: Orthopedics;  Laterality: Left;  Francena Hanly  . TOTAL VAGINAL HYSTERECTOMY  age 45   --due to abnormal pap smears per patient   Social History   Occupational History  . Not on file  Tobacco Use  . Smoking status: Never Smoker  . Smokeless tobacco: Never Used  Vaping Use  . Vaping Use: Never used  Substance and Sexual Activity  . Alcohol use: Not Currently  . Drug use: Never  . Sexual activity: Not Currently    Partners: Male    Birth control/protection: Surgical    Comment: TVH

## 2020-09-23 ENCOUNTER — Telehealth: Payer: Self-pay

## 2020-09-23 NOTE — Telephone Encounter (Signed)
Christine Scott is calling in wondering if she should go get tested as she was on a crowded bus this weekend and was notified that two people are now covid positive, and her friend who was also on the bus is having covid like symptoms so patient would like to know if she needs to get tested herself.

## 2020-09-23 NOTE — Telephone Encounter (Signed)
Yes, if around + covid exposure recommend testing.  Thanks!  Dr. Artis Flock

## 2020-09-24 ENCOUNTER — Other Ambulatory Visit: Payer: Federal, State, Local not specified - PPO

## 2020-09-24 ENCOUNTER — Telehealth: Payer: Self-pay

## 2020-09-24 DIAGNOSIS — Z20822 Contact with and (suspected) exposure to covid-19: Secondary | ICD-10-CM

## 2020-09-24 NOTE — Telephone Encounter (Signed)
I spoke with pt to offer advise. She says that she is feeling better today. Pt was advised to quarantine until test results are back, and to call to follow up with Dr. Artis Flock for virtual visit if symptoms began to worsen. She expressed understanding.

## 2020-09-24 NOTE — Telephone Encounter (Signed)
Pt has scheduled testing

## 2020-09-24 NOTE — Telephone Encounter (Signed)
Pt called stating she has been exposed to COVID. She is starting to get symptoms and is not feeling well. Pt states she is scheduled to get tested tonight. Pt asked if CMA would call her to give her info on what she should do to prevent getting worse. Please advise.

## 2020-09-25 LAB — SARS-COV-2, NAA 2 DAY TAT

## 2020-09-25 LAB — NOVEL CORONAVIRUS, NAA: SARS-CoV-2, NAA: DETECTED — AB

## 2020-09-26 ENCOUNTER — Telehealth: Payer: Self-pay | Admitting: Adult Health

## 2020-09-26 NOTE — Telephone Encounter (Signed)
Called to Discuss with patient about Covid symptoms and the use of the monoclonal antibody infusion for those with mild to moderate Covid symptoms and at a high risk of hospitalization.     Pt is qualified for this infusion due to co-morbid conditions and/or a member of an at-risk group.     Patient Active Problem List   Diagnosis Date Noted  . S/P reverse total shoulder arthroplasty, 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  . Greater trochanteric bursitis of left hip 07/04/2019  . DDD (degenerative disc disease), lumbar 02/25/2019  . Bipolar II disorder (HCC) 12/07/2018  . 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 History of depression 08/12/2017  . Essential hypertension 08/12/2017  . Lumbar radiculopathy 06/15/2017  . Sjogren's syndrome 12/25/2016  . 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 Primary osteoarthritis of both feet 12/25/2016  . Osteoporosis 12/25/2016    Patient declines infusion at this time. Symptoms tier reviewed as well as criteria for ending isolation. Preventative practices reviewed. Patient verbalized understanding.    Patient advised to call back if he/she decides that he/she does want to get infusion. Callback number to the infusion center given. Patient advised to go to Urgent care or ED with severe symptoms.   Monte Bronder NP-C

## 2020-10-07 ENCOUNTER — Other Ambulatory Visit: Payer: Self-pay

## 2020-10-07 ENCOUNTER — Encounter: Payer: Self-pay | Admitting: Pulmonary Disease

## 2020-10-07 ENCOUNTER — Ambulatory Visit (INDEPENDENT_AMBULATORY_CARE_PROVIDER_SITE_OTHER): Payer: Federal, State, Local not specified - PPO | Admitting: Pulmonary Disease

## 2020-10-07 DIAGNOSIS — R0600 Dyspnea, unspecified: Secondary | ICD-10-CM

## 2020-10-07 DIAGNOSIS — U071 COVID-19: Secondary | ICD-10-CM

## 2020-10-07 DIAGNOSIS — R0609 Other forms of dyspnea: Secondary | ICD-10-CM | POA: Insufficient documentation

## 2020-10-07 HISTORY — DX: Other forms of dyspnea: R06.09

## 2020-10-07 NOTE — Assessment & Plan Note (Signed)
Unable to perform any testing today since she is 13 days out from her Covid positive testing.  She is still coughing. Since she still has persistent symptoms I asked her to self isolate for another 7 days, defer flu shot for 2 weeks. After 2 weeks.  We can proceed with chest x-ray.  My concern would be for ILD related to Sjogren's or an other undiagnosed cardiovascular disease associated with PMR.  She does not have crackles on exam, she does not have any other stigmata of collagen vascular disease. Based on chest x-ray we can decide about PFTs but again Covid symptoms will have to be fully resolved for [place] to pursue this

## 2020-10-07 NOTE — Progress Notes (Signed)
Subjective:    Patient ID: Christine Scott, female    DOB: [birth year], 79 y.o.   MRN: [id number]  HPI  79 year old never smoker referred for evaluation of shortness of breath ongoing for at least 6 months. She reports shortness of breath after walking on an incline or climbing stairs that is relieved by resting.  She denies wheezing or coughing.  Symptoms have been mildly progressive.  She follows with rheumatology for polymyalgia rheumatica which was diagnosed in 2015 received steroids.  She also carries a diagnosis of Sjogren syndromes and complains of dry eyes and dry mouth but is not on any medications for this  Of note, she was vaccinated in January and February.  She took a bus trip to [address] with her friend in September and after her return developed URI symptoms with sore throat.  Her friend was also sick.  She was called by the bus company stating that 2 people on the bus had been diagnosed with Covid.  She underwent testing and was positive on 9/30.  Monoclonal antibody was offered but not required.  She now reports a dry cough, no worsening of her breathing, sore throat is resolved, no fevers  After obtaining this history, I had to put on PPE for the rest of the interview No chest x-rays available to review, she was offered a chest x-ray by rheumatology but refused  Past Medical History:  Diagnosis Date  . Abnormal Pap smear of cervix    --hx abnormal paps in her 30s and per patient this was reason for her Hysterectomy  . Anxiety   . Depression   . Dry eye   . Hypertension   . Osteoarthritis    hands  . Osteoporosis   . Polymyalgia rheumatica (HCC)    Past Surgical History:  Procedure Laterality Date  . ABDOMINAL HYSTERECTOMY    . BREAST SURGERY  2016   benign mass removed in [place], [place].  [place] FACIAL COSMETIC SURGERY    . REVERSE SHOULDER ARTHROPLASTY Left 04/16/2020   Procedure: REVERSE SHOULDER ARTHROPLASTY;  Surgeon: Francena Hanly, MD;  Location: [facility];  Service:  Orthopedics;  Laterality: Left;   . TOTAL VAGINAL HYSTERECTOMY  age 53   --due to abnormal pap smears per patient    No Known Allergies  Social History   Socioeconomic History  . Marital status: Widowed    Spouse name: Not on file  . Number of children: Not on file  . Years of education: Not on file  . Highest education level: Not on file  Occupational History  . Not on file  Tobacco Use  . Smoking status: Never Smoker  . Smokeless tobacco: Never Used  Vaping Use  . Vaping Use: Never used  Substance and Sexual Activity  . Alcohol use: Not Currently  . Drug use: Never  . Sexual activity: Not Currently    Partners: Male    Birth control/protection: Surgical    Comment: TVH  Other Topics Concern  . Not on file  Social History Narrative  . Not on file   Social Determinants of Health   Financial Resource Strain:   . Difficulty of Paying Living Expenses: Not on file  Food Insecurity:   . Worried About Programme researcher, broadcasting/film/video in the Last Year: Not on file  . Ran Out of Food in the Last Year: Not on file  Transportation Needs:   . Lack of Transportation (Medical): Not on file  . Lack of Transportation (Non-Medical):  Not on file  Physical Activity:   . Days of Exercise per Week: Not on file  . Minutes of Exercise per Session: Not on file  Stress:   . Feeling of Stress : Not on file  Social Connections:   . Frequency of Communication with Friends and Family: Not on file  . Frequency of Social Gatherings with Friends and Family: Not on file  . Attends Religious Services: Not on file  . Active Member of Clubs or Organizations: Not on file  . Attends Banker Meetings: Not on file  . Marital Status: Not on file  Intimate Partner Violence:   . Fear of Current or Ex-Partner: Not on file  . Emotionally Abused: Not on file  . Physically Abused: Not on file  . Sexually Abused: Not on file    Family History  Problem Relation Age of Onset  . Congestive  Heart Failure Mother   . Stroke Mother   . Diabetes Father   . Colon cancer Brother   . Diabetes Brother   . Diabetes Brother   . Stroke Brother   . Colon cancer Daughter      Review of Systems Shortness of breath with activity Nonproductive cough Indigestion Nasal congestion Anxiety and depression    Objective:   Physical Exam  Gen. Pleasant, well-nourished, in no distress, normal affect ENT - no pallor,icterus, no post nasal drip Neck: No JVD, no thyromegaly, no carotid bruits Lungs: no use of accessory muscles, no dullness to percussion, clear without rales or rhonchi  Cardiovascular: Rhythm regular, heart sounds  normal, no murmurs or gallops, no peripheral edema Abdomen: soft and non-tender, no hepatosplenomegaly, BS normal. Musculoskeletal: No deformities, no cyanosis or clubbing Neuro:  alert, non focal       Assessment & Plan:

## 2020-10-07 NOTE — Assessment & Plan Note (Signed)
She only has mild disease due to her vaccinated status. I have asked her to hold off on the flu shot for 2 weeks.  She is  recovering well and oxygenation is good

## 2020-10-07 NOTE — Patient Instructions (Signed)
CXR in 2 weeks Based on this, we will decide about lung function testing Isolation precautions for another week until cough goes away

## 2020-10-19 ENCOUNTER — Ambulatory Visit: Payer: Federal, State, Local not specified - PPO | Admitting: Orthopedic Surgery

## 2020-10-20 ENCOUNTER — Telehealth: Payer: Self-pay

## 2020-10-20 NOTE — Telephone Encounter (Signed)
Patient is currently enrolled in Prolia, but most recent T score was -1.7 last year. Do you want an updated DEXA? Looks like one is ordered in patients chart.

## 2020-10-21 ENCOUNTER — Other Ambulatory Visit: Payer: Self-pay | Admitting: Family Medicine

## 2020-10-21 NOTE — Telephone Encounter (Signed)
I typically do not repeat yearly if bone mineral density has shown improvement on the prolia. I will go every 2 years. I am attaching her rheumatologist on this as well as I believe she may have ordered this last time in case she would like another one.   Thanks! Dr. Artis Flock

## 2020-10-21 NOTE — Telephone Encounter (Signed)
Called and spoke to [name]. [name] verbalized understanding and had no further questions.

## 2020-10-21 NOTE — Telephone Encounter (Signed)
-  let her know she does not need another bone scan for 2-3 years. Will continue prolia. Thanks for asking!  Dr. Artis Flock

## 2020-10-21 NOTE — Telephone Encounter (Signed)
As BMD has improved patient may continue Prolia.  We will not discontinue Prolia as that will increase the risk of fractures.  She will be taking Prolia lifelong unless we switch her to a bisphosphonate.  I agree that there is no need to check a bone density earlier.  In fact BMD can be repeated in 2 to 3 years.

## 2020-11-07 ENCOUNTER — Ambulatory Visit: Payer: Federal, State, Local not specified - PPO | Attending: Internal Medicine

## 2020-11-07 DIAGNOSIS — Z23 Encounter for immunization: Secondary | ICD-10-CM

## 2020-11-18 ENCOUNTER — Other Ambulatory Visit: Payer: Self-pay | Admitting: Family Medicine

## 2020-11-24 ENCOUNTER — Other Ambulatory Visit: Payer: Self-pay

## 2020-11-24 ENCOUNTER — Telehealth: Payer: Self-pay

## 2020-11-24 ENCOUNTER — Ambulatory Visit (INDEPENDENT_AMBULATORY_CARE_PROVIDER_SITE_OTHER): Payer: Federal, State, Local not specified - PPO

## 2020-11-24 DIAGNOSIS — Z23 Encounter for immunization: Secondary | ICD-10-CM | POA: Diagnosis not present

## 2020-11-24 NOTE — Telephone Encounter (Signed)
Noted  

## 2020-11-24 NOTE — Telephone Encounter (Signed)
Patient dropped off a form for a handicap sticker, asked to be called once it is finished.

## 2020-12-08 ENCOUNTER — Other Ambulatory Visit: Payer: Self-pay

## 2020-12-08 ENCOUNTER — Encounter: Payer: Self-pay | Admitting: Pulmonary Disease

## 2020-12-08 ENCOUNTER — Ambulatory Visit (INDEPENDENT_AMBULATORY_CARE_PROVIDER_SITE_OTHER): Payer: Federal, State, Local not specified - PPO | Admitting: Pulmonary Disease

## 2020-12-08 ENCOUNTER — Ambulatory Visit (INDEPENDENT_AMBULATORY_CARE_PROVIDER_SITE_OTHER): Payer: Federal, State, Local not specified - PPO

## 2020-12-08 VITALS — BP 126/64 | HR 73 | Temp 97.7°F | Ht 61.0 in | Wt 127.6 lb

## 2020-12-08 DIAGNOSIS — R0609 Other forms of dyspnea: Secondary | ICD-10-CM

## 2020-12-08 DIAGNOSIS — R0600 Dyspnea, unspecified: Secondary | ICD-10-CM

## 2020-12-08 NOTE — Progress Notes (Signed)
   Subjective:    Patient ID: Christine Scott, female    DOB: 1941-04-09, 79 y.o.   MRN: [id number]  HPI  79 year old never smoker for FU of shortness of breath ongoing since 02/2020 PMH - polymyalgia rheumatica  diagnosed in 2015 received steroids. Sjogren syndrome -not on any medications   9/30 COVID + , initial office visit 10/13 She has recovered fully from Covid, continues to have dyspnea No cough or wheezing. No orthopnea, PND or pedal edema She does complain of bilateral shoulder pain and pain in her hips but PMR seems to be otherwise in remission She complains of dry eyes and dry mouth  Review of Systems neg for any significant sore throat, dysphagia, itching, sneezing, nasal congestion or excess/ purulent secretions, fever, chills, sweats, unintended wt loss, pleuritic or exertional cp, hempoptysis, orthopnea pnd or change in chronic leg swelling. Also denies presyncope, palpitations, heartburn, abdominal pain, nausea, vomiting, diarrhea or change in bowel or urinary habits, dysuria,hematuria, rash, arthralgias, visual complaints, headache, numbness weakness or ataxia.     Objective:   Physical Exam  Gen. Pleasant, well-nourished, in no distress ENT - no thrush, no pallor/icterus,no post nasal drip Neck: No JVD, no thyromegaly, no carotid bruits Lungs: no use of accessory muscles, no dullness to percussion, clear without rales or rhonchi  Cardiovascular: Rhythm regular, heart sounds  normal, no murmurs or gallops, no peripheral edema Musculoskeletal: No deformities, no cyanosis or clubbing        Assessment & Plan:

## 2020-12-08 NOTE — Assessment & Plan Note (Signed)
Chest x-ray was obtained and appears clear, independently reviewed, no infiltrates, she has significant scoliosis and osteoporosis no significant kyphosis. On ambulation, she did not desaturate on exertion. We will obtain echocardiogram to rule out cardiac cause of dyspnea if this is normal we will pause there and give her about 6 months before we reassess

## 2020-12-08 NOTE — Patient Instructions (Signed)
Chest x-ray today. Ambulatory saturation. If these tests are normal, then proceed with echocardiogram to look for cardiac cause of shortness of breath

## 2020-12-09 ENCOUNTER — Ambulatory Visit (HOSPITAL_COMMUNITY): Payer: Federal, State, Local not specified - PPO | Attending: Cardiovascular Disease

## 2020-12-09 ENCOUNTER — Other Ambulatory Visit: Payer: Self-pay

## 2020-12-09 DIAGNOSIS — R0600 Dyspnea, unspecified: Secondary | ICD-10-CM

## 2020-12-09 DIAGNOSIS — R0609 Other forms of dyspnea: Secondary | ICD-10-CM

## 2020-12-09 LAB — ECHOCARDIOGRAM COMPLETE
Area-P 1/2: 3.12 cm2
P 1/2 time: 540 msec
S' Lateral: 2.6 cm

## 2020-12-11 NOTE — Progress Notes (Signed)
Called and went over echo results per Dr Vassie Loll with patient. All questions answered and patient expressed full understanding. Nothing further needed at this time.

## 2020-12-23 ENCOUNTER — Telehealth: Payer: Self-pay

## 2020-12-23 MED ORDER — LIDO-CAPSAICIN-MEN-METHYL SAL 0.5-0.035-5-20 % EX PTCH: 1.0000 | MEDICATED_PATCH | CUTANEOUS | 0 refills | Status: DC
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
## 2020-12-26 DIAGNOSIS — M719 Bursopathy, unspecified: Secondary | ICD-10-CM

## 2020-12-26 HISTORY — DX: Bursopathy, unspecified: M71.9

## 2020-12-29 NOTE — Telephone Encounter (Signed)
Lidocaine patch 5% patient states this is the correct RX and it is just lidocaine no other ingredient she states dr.wolfe prescribed a different medication.

## 2020-12-30 MED ORDER — LIDOCAINE 5 % EX PTCH: 1.0000 | MEDICATED_PATCH | CUTANEOUS | 0 refills | Status: DC

## 2020-12-30 NOTE — Telephone Encounter (Signed)
Sent in the correct medication.  Christine Mustard, MD [facility]

## 2020-12-30 NOTE — Telephone Encounter (Signed)
Called pt to make her aware of message below. Pt voiced understanding.

## 2020-12-30 NOTE — Addendum Note (Signed)
Addended by: Orland Mustard on: 12/30/2020 11:37 AM   Modules accepted: Orders

## 2021-01-15 ENCOUNTER — Other Ambulatory Visit: Payer: Self-pay | Admitting: Sports Medicine

## 2021-01-15 DIAGNOSIS — M542 Cervicalgia: Secondary | ICD-10-CM

## 2021-01-19 ENCOUNTER — Other Ambulatory Visit: Payer: Self-pay

## 2021-01-19 ENCOUNTER — Ambulatory Visit
Admission: RE | Admit: 2021-01-19 | Discharge: 2021-01-19 | Disposition: A | Discharge status: Home / Self Care | Payer: Federal, State, Local not specified - PPO | Source: Ambulatory Visit | Attending: Sports Medicine | Admitting: Sports Medicine

## 2021-01-19 DIAGNOSIS — M542 Cervicalgia: Secondary | ICD-10-CM

## 2021-01-28 ENCOUNTER — Encounter: Payer: Self-pay | Admitting: Rheumatology

## 2021-01-28 ENCOUNTER — Other Ambulatory Visit: Payer: Self-pay

## 2021-01-28 ENCOUNTER — Encounter: Payer: Self-pay | Admitting: Neurology

## 2021-01-28 ENCOUNTER — Ambulatory Visit: Payer: Federal, State, Local not specified - PPO | Admitting: Rheumatology

## 2021-01-28 VITALS — BP 143/71 | HR 70 | Resp 13 | Ht 61.0 in | Wt 128.0 lb

## 2021-01-28 DIAGNOSIS — M5136 Other intervertebral disc degeneration, lumbar region: Secondary | ICD-10-CM

## 2021-01-28 DIAGNOSIS — M353 Polymyalgia rheumatica: Secondary | ICD-10-CM

## 2021-01-28 DIAGNOSIS — M19041 Primary osteoarthritis, right hand: Secondary | ICD-10-CM | POA: Diagnosis not present

## 2021-01-28 DIAGNOSIS — M51369 Other intervertebral disc degeneration, lumbar region without mention of lumbar back pain or lower extremity pain: Secondary | ICD-10-CM

## 2021-01-28 DIAGNOSIS — M19071 Primary osteoarthritis, right ankle and foot: Secondary | ICD-10-CM

## 2021-01-28 DIAGNOSIS — M19042 Primary osteoarthritis, left hand: Secondary | ICD-10-CM

## 2021-01-28 DIAGNOSIS — Z96612 Presence of left artificial shoulder joint: Secondary | ICD-10-CM | POA: Diagnosis not present

## 2021-01-28 DIAGNOSIS — M3501 Sicca syndrome with keratoconjunctivitis: Secondary | ICD-10-CM

## 2021-01-28 DIAGNOSIS — M19072 Primary osteoarthritis, left ankle and foot: Secondary | ICD-10-CM

## 2021-01-28 DIAGNOSIS — M810 Age-related osteoporosis without current pathological fracture: Secondary | ICD-10-CM

## 2021-01-28 DIAGNOSIS — M5030 Other cervical disc degeneration, unspecified cervical region: Secondary | ICD-10-CM

## 2021-01-28 DIAGNOSIS — R413 Other amnesia: Secondary | ICD-10-CM

## 2021-01-28 DIAGNOSIS — R0602 Shortness of breath: Secondary | ICD-10-CM

## 2021-01-28 DIAGNOSIS — Z8659 Personal history of other mental and behavioral disorders: Secondary | ICD-10-CM

## 2021-01-28 DIAGNOSIS — Z8679 Personal history of other diseases of the circulatory system: Secondary | ICD-10-CM
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
## 2021-03-02 ENCOUNTER — Ambulatory Visit: Payer: Federal, State, Local not specified - PPO | Admitting: Rheumatology

## 2021-03-25 ENCOUNTER — Telehealth: Payer: Self-pay

## 2021-03-25 NOTE — Telephone Encounter (Signed)
LVM asking patient to call back to get scheduled. 

## 2021-03-25 NOTE — Telephone Encounter (Signed)
Please schedule patient for Prolia injection  PA approved until 08/27/21

## 2021-03-26 NOTE — Telephone Encounter (Signed)
Patient is scheduled 03/30/21 

## 2021-03-30 ENCOUNTER — Other Ambulatory Visit: Payer: Self-pay

## 2021-03-30 ENCOUNTER — Ambulatory Visit (INDEPENDENT_AMBULATORY_CARE_PROVIDER_SITE_OTHER): Payer: Federal, State, Local not specified - PPO

## 2021-03-30 DIAGNOSIS — M810 Age-related osteoporosis without current pathological fracture: Secondary | ICD-10-CM

## 2021-03-30 MED ORDER — DENOSUMAB 60 MG/ML ~~LOC~~ SOSY
60.0000 mg | PREFILLED_SYRINGE | Freq: Once | SUBCUTANEOUS | Status: AC
Start: 2021-03-30 — End: 2021-03-30
  Administered 2021-03-30: 60 mg via SUBCUTANEOUS

## 2021-03-30 NOTE — Progress Notes (Signed)
Per orders of Dr. Artis Flock, injection of Prolia  given by Yolanda Manges in left arm. Patient tolerated injection well. Patient will make appointment for 6 month.

## 2021-04-15 ENCOUNTER — Encounter: Payer: Self-pay | Admitting: Family Medicine

## 2021-04-15 ENCOUNTER — Other Ambulatory Visit: Payer: Self-pay

## 2021-04-15 ENCOUNTER — Ambulatory Visit: Payer: Federal, State, Local not specified - PPO | Admitting: Family Medicine

## 2021-04-15 VITALS — BP 124/60 | HR 81 | Temp 97.3°F | Ht 61.0 in | Wt 127.4 lb

## 2021-04-15 DIAGNOSIS — M810 Age-related osteoporosis without current pathological fracture: Secondary | ICD-10-CM

## 2021-04-15 DIAGNOSIS — I10 Essential (primary) hypertension: Secondary | ICD-10-CM | POA: Diagnosis not present

## 2021-04-15 LAB — CBC WITH DIFFERENTIAL/PLATELET
Basophils Absolute: 0 10*3/uL (ref 0.0–0.1)
Basophils Relative: 0.6 % (ref 0.0–3.0)
Eosinophils Absolute: 0 10*3/uL (ref 0.0–0.7)
Eosinophils Relative: 0.2 % (ref 0.0–5.0)
HCT: 36 % (ref 36.0–46.0)
Hemoglobin: 11.7 g/dL — ABNORMAL LOW (ref 12.0–15.0)
Lymphocytes Relative: 12 % (ref 12.0–46.0)
Lymphs Abs: 0.7 10*3/uL (ref 0.7–4.0)
MCHC: 32.6 g/dL (ref 30.0–36.0)
MCV: 91.2 fl (ref 78.0–100.0)
Monocytes Absolute: 0.3 10*3/uL (ref 0.1–1.0)
Monocytes Relative: 5.5 % (ref 3.0–12.0)
Neutro Abs: 4.7 10*3/uL (ref 1.4–7.7)
Neutrophils Relative %: 81.7 % — ABNORMAL HIGH (ref 43.0–77.0)
Platelets: 213 10*3/uL (ref 150.0–400.0)
RBC: 3.95 Mil/uL (ref 3.87–5.11)
RDW: 14.3 % (ref 11.5–15.5)
WBC: 5.8 10*3/uL (ref 4.0–10.5)

## 2021-04-15 LAB — LIPID PANEL
Cholesterol: 188 mg/dL (ref 0–200)
HDL: 85.6 mg/dL (ref >39.00)
LDL Cholesterol: 89 mg/dL (ref 0–99)
NonHDL: 102.18
Total CHOL/HDL Ratio: 2
Triglycerides: 64 mg/dL (ref 0.0–149.0)
VLDL: 12.8 mg/dL (ref 0.0–40.0)

## 2021-04-15 LAB — MICROALBUMIN / CREATININE URINE RATIO
Creatinine,U: 49.3 mg/dL
Microalb Creat Ratio: 5.9 mg/g (ref 0.0–30.0)
Microalb, Ur: 2.9 mg/dL — ABNORMAL HIGH (ref 0.0–1.9)

## 2021-04-15 LAB — COMPREHENSIVE METABOLIC PANEL
ALT: 11 U/L (ref 0–35)
AST: 20 U/L (ref 0–37)
Albumin: 4.1 g/dL (ref 3.5–5.2)
Alkaline Phosphatase: 58 U/L (ref 39–117)
BUN: 15 mg/dL (ref 6–23)
CO2: 26 mEq/L (ref 19–32)
Calcium: 9.2 mg/dL (ref 8.4–10.5)
Chloride: 101 mEq/L (ref 96–112)
Creatinine, Ser: 0.75 mg/dL (ref 0.40–1.20)
GFR: 74.91 mL/min (ref >60.00)
Glucose, Bld: 95 mg/dL (ref 70–99)
Potassium: 4.2 mEq/L (ref 3.5–5.1)
Sodium: 136 mEq/L (ref 135–145)
Total Bilirubin: 0.4 mg/dL (ref 0.2–1.2)
Total Protein: 7.3 g/dL (ref 6.0–8.3)

## 2021-04-15 MED ORDER — AMLODIPINE BESYLATE 10 MG PO TABS: 1.0000 | ORAL_TABLET | Freq: Every day | ORAL | 3 refills | Status: DC

## 2021-04-15 NOTE — Progress Notes (Signed)
Patient: Christine Scott MRN: [id number] DOB: [birth year] PCP: Orland Mustard, MD     Subjective:  Chief Complaint  Patient presents with  . Hypertension  . Osteoarthritis  . Referral    She wold like to speak with some one to help with her wine addiction. She says that it is effecting her life.    HPI: The patient is a 80 y.o. female who presents today for HTN and Osteoarthritis. She also is requesting a referral to a Psychologist. She admits to drinking too much wine. She has been traveling a lot with her friend and is doing really well. Very active and busy for her age. UTD on her HM.   Hypertension: Here for follow up of hypertension.  Currently on norvasc 10mg /day . Home readings range from 120-125 systolic/60-80 diastolic. Takes medication as prescribed and denies any side effects. Exercise includes walking. Weight has been stable. Denies any chest pain, headaches, shortness of breath, vision changes, swelling in lower extremities.   Osteoporosis On prolia q 6 months. Had her last injection 03/30/2021. Last dexa 11/2019. Due this year.   Alcohol abuse She states she started drinking about 7 years ago after her husband died. She drinks about 2 bottles/week+, but can have one bottle or more/night at times. She will go days without it though. No one really knows she drinks. She states she has gotten to the point where she was drinking nightly or didn't remember if she drank the night before. She never drives and she never gets black out or doesn't remember things. She is now drinking every other night. She usually will just go to bed. She is very concerned people may read her chart and is asking that I write down things on her AVS instead of typing after this appointment. It makes her feel bad the next day and depresses her.      Review of Systems  Constitutional: Negative for chills, fatigue and fever.  HENT: Negative for dental problem, ear pain, hearing loss and trouble swallowing.    Eyes: Negative for visual disturbance.  Respiratory: Negative for cough, chest tightness and shortness of breath.   Cardiovascular: Negative for chest pain, palpitations and leg swelling.  Gastrointestinal: Negative for abdominal pain, blood in stool, diarrhea and nausea.  Endocrine: Negative for cold intolerance, polydipsia, polyphagia and polyuria.  Genitourinary: Negative for dysuria and hematuria.  Musculoskeletal: Negative for arthralgias.  Skin: Negative for rash.  Neurological: Negative for dizziness and headaches.  Psychiatric/Behavioral: Negative for dysphoric mood and sleep disturbance. The patient is not nervous/anxious.     Allergies Patient has No Known Allergies.  Past Medical History Patient  has a past medical history of Abnormal Pap smear of cervix, Anxiety, Depression, Dry eye, Hypertension, Osteoarthritis, Osteoporosis, and Polymyalgia rheumatica (HCC).  Surgical History Patient  has a past surgical history that includes Facial cosmetic surgery; Total vaginal hysterectomy (age 51); Breast surgery (2016); Abdominal hysterectomy; and Reverse shoulder arthroplasty (Left, 04/16/2020).  Family History Pateint's family history includes Colon cancer in her brother and daughter; Congestive Heart Failure in her mother; Diabetes in her brother, brother, and father; Stroke in her brother and mother.  Social History Patient  reports that she has never smoked. She has never used smokeless tobacco. She reports previous alcohol use. She reports that she does not use drugs.    Objective: Vitals:   04/15/21 0928  BP: 124/60  Pulse: 81  Temp: (!) 97.3 F (36.3 C)  TempSrc: Temporal  SpO2: 98%  Weight: 127  lb 6.4 oz (57.8 kg)  Height: 5\' 1"  (1.549 m)    Body mass index is 24.07 kg/m.  Physical Exam Vitals reviewed.  Constitutional:      Appearance: Normal appearance. She is normal weight.  HENT:     Head: Normocephalic and atraumatic.     Comments: Hard of hearing,  forgot hearing aides  Neck:     Vascular: No carotid bruit.  Cardiovascular:     Rate and Rhythm: Normal rate and regular rhythm.     Pulses: Normal pulses.     Heart sounds: Normal heart sounds.  Pulmonary:     Effort: Pulmonary effort is normal.     Breath sounds: Normal breath sounds.  Abdominal:     General: Bowel sounds are normal.     Palpations: Abdomen is soft.  Musculoskeletal:     Cervical back: Normal range of motion and neck supple.  Skin:    General: Skin is warm.     Capillary Refill: Capillary refill takes less than 2 seconds.  Neurological:     General: No focal deficit present.     Mental Status: She is alert and oriented to person, place, and time.  Psychiatric:        Mood and Affect: Mood normal.        Behavior: Behavior normal.        Assessment/plan: 1. Essential hypertension Blood pressure is to goal. Continue current anti-hypertensive medication-norvasc 10mg /day. Refills given and routine lab work will be done today. Recommended routine exercise and healthy diet including DASH diet and mediterranean diet. Encouraged weight loss. F/u in 6 months.    - Comprehensive metabolic panel - Lipid panel - Microalbumin / creatinine urine ratio - CBC with Differential/Platelet  2. Osteoporosis utd on prolia. dexa due this year, 11/2021. On calcium/D, walking.   3. Alcohol abuse -already on MV and discussed checking for folic acid/B6 and add on 100mg  of thiamine if not in her MV -[facility] and she will call. I did not write anything in AVS and wrote all of this information down for her.  -precautions given.    Return in about 6 months (around 10/15/2021) for htn/prolia .   12/2021, MD [facility]   04/15/2021

## 2021-04-16 ENCOUNTER — Other Ambulatory Visit: Payer: Self-pay | Admitting: Family Medicine

## 2021-05-03 ENCOUNTER — Other Ambulatory Visit: Payer: Self-pay

## 2021-05-03 ENCOUNTER — Encounter: Payer: Self-pay | Admitting: Neurology

## 2021-05-03 ENCOUNTER — Other Ambulatory Visit (INDEPENDENT_AMBULATORY_CARE_PROVIDER_SITE_OTHER): Payer: Federal, State, Local not specified - PPO

## 2021-05-03 ENCOUNTER — Ambulatory Visit: Payer: Federal, State, Local not specified - PPO | Admitting: Neurology

## 2021-05-03 VITALS — BP 147/69 | HR 77 | Ht 61.0 in | Wt 126.6 lb

## 2021-05-03 DIAGNOSIS — G3184 Mild cognitive impairment, so stated: Secondary | ICD-10-CM | POA: Diagnosis not present

## 2021-05-03 LAB — VITAMIN B12: Vitamin B-12: 519 pg/mL (ref 211–911)

## 2021-05-03 LAB — TSH: TSH: 1.92 u[IU]/mL (ref 0.35–4.50)

## 2021-05-03 LAB — FOLATE: Folate: >24.4 ng/mL (ref >5.9)

## 2021-05-03 NOTE — Patient Instructions (Addendum)
Good to meet you!  1.Bloodwork for TSH, B12, B1, folic acid  2. Schedule open MRI brain without contrast  3. Schedule Neurocognitive testing  4. Start cutting down on alcohol intake  5. Follow-up after tests, call for any changes   You have been referred for a neuropsychological evaluation (i.e., evaluation of memory and thinking abilities). Please bring someone with you to this appointment if possible, as it is helpful for the doctor to hear from both you and another adult who knows you well. Please bring eyeglasses and hearing aids if you wear them.    The evaluation will take approximately 3 hours and has two parts:   . The first part is a clinical interview with the neuropsychologist (Dr. Milbert Coulter or Dr. Roseanne Reno). During the interview, the neuropsychologist will speak with you and the individual you brought to the appointment.    . The second part of the evaluation is testing with the doctor's technician Annabelle Harman or Selena Batten). During the testing, the technician will ask you to remember different types of material, solve problems, and answer some questionnaires. Your family member will not be present for this portion of the evaluation.   Please note: We must reserve several hours of the neuropsychologist's time and the psychometrician's time for your evaluation appointment. As such, there is a No-Show fee of $100. If you are unable to attend any of your appointments, please contact our office as soon as possible to reschedule.     RECOMMENDATIONS FOR ALL PATIENTS WITH MEMORY PROBLEMS: 1. Continue to exercise (Recommend 30 minutes of walking everyday, or 3 hours every week) 2. Increase social interactions - continue going to [place] and enjoy social gatherings with friends and family 3. Eat healthy, avoid fried foods and eat more fruits and vegetables 4. Maintain adequate blood pressure, blood sugar, and blood cholesterol level. Reducing the risk of stroke and cardiovascular disease also helps  promoting better memory. 5. Avoid stressful situations. Live a simple life and avoid aggravations. Organize your time and prepare for the next day in anticipation. 6. Sleep well, avoid any interruptions of sleep and avoid any distractions in the bedroom that may interfere with adequate sleep quality 7. Avoid sugar, avoid sweets as there is a strong link between excessive sugar intake, diabetes, and cognitive impairment We discussed the Mediterranean diet, which has been shown to help patients reduce the risk of progressive memory disorders and reduces cardiovascular risk. This includes eating fish, eat fruits and green leafy vegetables, nuts like almonds and hazelnuts, walnuts, and also use olive oil. Avoid fast foods and fried foods as much as possible. Avoid sweets and sugar as sugar use has been linked to worsening of memory function.
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
## 2021-05-07 LAB — VITAMIN B1: Vitamin B1 (Thiamine): 35 nmol/L — ABNORMAL HIGH (ref 8–30)

## 2021-05-11 ENCOUNTER — Other Ambulatory Visit: Payer: Self-pay

## 2021-05-11 NOTE — Progress Notes (Signed)
NO PA is needed for MRI reference number [id number]

## 2021-05-17 ENCOUNTER — Telehealth: Payer: Self-pay

## 2021-05-17 NOTE — Telephone Encounter (Signed)
Pt called stating she received a bill from her [facility] shot. Pt is asking for the bill to be sent to [facility] instead of her. Pt states her insurance should have covered it and she should not receive a bill.  Please advise.

## 2021-05-17 NOTE — Telephone Encounter (Signed)
Please Advise
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
## 2021-05-20 ENCOUNTER — Telehealth: Payer: Self-pay

## 2021-05-20 ENCOUNTER — Ambulatory Visit (INDEPENDENT_AMBULATORY_CARE_PROVIDER_SITE_OTHER): Payer: Federal, State, Local not specified - PPO

## 2021-05-20 ENCOUNTER — Ambulatory Visit (INDEPENDENT_AMBULATORY_CARE_PROVIDER_SITE_OTHER): Payer: Federal, State, Local not specified - PPO | Admitting: Family Medicine

## 2021-05-20 ENCOUNTER — Ambulatory Visit: Payer: Self-pay

## 2021-05-20 VITALS — BP 148/86 | Ht 61.0 in | Wt 126.6 lb

## 2021-05-20 DIAGNOSIS — M25511 Pain in right shoulder: Secondary | ICD-10-CM

## 2021-05-20 NOTE — Telephone Encounter (Signed)
-----   Message from Van Clines, MD sent at 05/11/2021  8:38 AM EDT ----- Pls let her know the bloodwork was normal. Proceed with memory testing as scheduled, thanks

## 2021-05-20 NOTE — Telephone Encounter (Signed)
Working on this with Claris Che but I need to know if PA was done for this patient before she had injection in April I do not see anywhere in the notes that PA was done-if not that would be why she received a bill-can you reach out to [facility] to see if this was done

## 2021-05-20 NOTE — Telephone Encounter (Signed)
Pt called no answer left a voice mail per DPR stating that bloodwork was normal. Proceed with memory testing as scheduled any questions call the office back at [phone]

## 2021-05-20 NOTE — Patient Instructions (Signed)
Thank you for coming in today.  I've referred you to Physical Therapy.  Let us know if you don't hear from them in one week.  Please get an Xray today before you leave  Recheck with me in 6 weeks.   We have more to do if needed at the next visit.

## 2021-05-25 NOTE — Progress Notes (Signed)
Right shoulder x-ray shows a little arthritis

## 2021-05-26 ENCOUNTER — Telehealth: Payer: Self-pay

## 2021-05-26 ENCOUNTER — Other Ambulatory Visit: Payer: Self-pay

## 2021-05-26 DIAGNOSIS — M25511 Pain in right shoulder: Secondary | ICD-10-CM

## 2021-05-26 NOTE — Telephone Encounter (Signed)
Left pt a VM asking which PT location she preferred. If she calls back, can we please get this information, so we can enter the PT referral.

## 2021-05-31 ENCOUNTER — Ambulatory Visit: Payer: Federal, State, Local not specified - PPO | Attending: Internal Medicine

## 2021-05-31 ENCOUNTER — Other Ambulatory Visit: Payer: Self-pay | Admitting: Gastroenterology

## 2021-05-31 ENCOUNTER — Other Ambulatory Visit (HOSPITAL_BASED_OUTPATIENT_CLINIC_OR_DEPARTMENT_OTHER): Payer: Self-pay

## 2021-05-31 ENCOUNTER — Other Ambulatory Visit: Payer: Self-pay

## 2021-05-31 ENCOUNTER — Telehealth: Payer: Self-pay

## 2021-05-31 DIAGNOSIS — Z86010 Personal history of colonic polyps: Secondary | ICD-10-CM

## 2021-05-31 DIAGNOSIS — Z800 Family history of malignant neoplasm of digestive organs: Secondary | ICD-10-CM

## 2021-05-31 DIAGNOSIS — Z23 Encounter for immunization: Secondary | ICD-10-CM

## 2021-05-31 MED ORDER — PFIZER-BIONT COVID-19 VAC-TRIS 30 MCG/0.3ML IM SUSP
INTRAMUSCULAR | 0 refills | Status: DC
  Filled 2021-05-31: qty 0.3, 1d supply, fill #0

## 2021-05-31 NOTE — Progress Notes (Signed)
   Covid-19 Vaccination Clinic  Name:  Christine Scott    MRN: [id number] DOB: September 16, 1941  05/31/2021  Christine Scott was observed post Covid-19 immunization for 15 minutes without incident. She was provided with Vaccine Information Sheet and instruction to access the V-Safe system.   Christine Scott was instructed to call 911 with any severe reactions post vaccine: Marland Kitchen Difficulty breathing  . Swelling of face and throat  . A fast heartbeat  . A bad rash all over body  . Dizziness and weakness   Immunizations Administered    Name Date Dose VIS Date Route   PFIZER Comrnaty(Gray TOP) Covid-19 Vaccine 05/31/2021  1:01 PM 0.3 mL 12/03/2020 Intramuscular   Manufacturer: [employer], [employer]   Lot: [id number]   NDC: [id number]

## 2021-05-31 NOTE — Telephone Encounter (Signed)
-----   Message from Van Clines, MD sent at 05/26/2021  2:36 PM EDT ----- Regarding: MRI Received fax from [facility] that she could not due MRI due to claustrophobia. Pls call patient and let her know to reschedule open MRI, let us know date and we will send in Valium 5mg  to take 30 mins prior to MRI, may take second dose if needed. Thanks

## 2021-05-31 NOTE — Telephone Encounter (Signed)
Pt called an informed that we will start with the memory testing scheduled next month, and if recommendations include doing brain imaging, we can discuss after. Pt verbalized understanding

## 2021-05-31 NOTE — Telephone Encounter (Signed)
We can start with the memory testing scheduled next month, and if recommendations include doing brain imaging, we can discuss after. thanks

## 2021-05-31 NOTE — Telephone Encounter (Signed)
Pt informed that we Received fax from [facility] that she could not due MRI due to claustrophobia.  patient called and informed to reschedule open MRI, let us know date and we will send in Valium 5mg  to take 30 mins prior to MRI, may take second dose if needed

## 2021-05-31 NOTE — Telephone Encounter (Signed)
Pt is asking if she really need to have the MRI done?, just thinking about it and talking about it makes her heart race. She is asking what do you think you will find?

## 2021-06-02 ENCOUNTER — Other Ambulatory Visit: Payer: Self-pay

## 2021-06-02 ENCOUNTER — Encounter: Payer: Self-pay | Admitting: Counselor

## 2021-06-02 ENCOUNTER — Ambulatory Visit (INDEPENDENT_AMBULATORY_CARE_PROVIDER_SITE_OTHER): Payer: Federal, State, Local not specified - PPO | Admitting: Counselor

## 2021-06-02 ENCOUNTER — Ambulatory Visit: Payer: Federal, State, Local not specified - PPO | Admitting: Psychology

## 2021-06-02 DIAGNOSIS — F09 Unspecified mental disorder due to known physiological condition: Secondary | ICD-10-CM

## 2021-06-02 DIAGNOSIS — F4323 Adjustment disorder with mixed anxiety and depressed mood: Secondary | ICD-10-CM

## 2021-06-02 DIAGNOSIS — G3184 Mild cognitive impairment, so stated: Secondary | ICD-10-CM
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
## 2021-06-02 NOTE — Progress Notes (Signed)
   Psychometrist Note   Cognitive testing was administered to [name] by Milana Kidney, B.S. (Technician) under the supervision of Alphonzo Severance, Psy.D., ABN. [name] was able to tolerate all test procedures. Dr. Nicole Kindred met with the patient as needed to manage any emotional reactions to the testing procedures. Rest breaks were offered.    The battery of tests administered was selected by Dr. Nicole Kindred with consideration to the patient's current level of functioning, the nature of her symptoms, emotional and behavioral responses during the interview, level of literacy, observed level of motivation/effort, and the nature of the referral question. This battery was communicated to the psychometrist. Communication between Dr. Nicole Kindred and the psychometrist was ongoing throughout the evaluation and Dr. Nicole Kindred was immediately accessible at all times. Dr. Nicole Kindred provided supervision to the technician on the date of this service, to the extent necessary to assure the quality of all services provided.    [name] will return in approximately one week for an interactive feedback session with Dr. Nicole Kindred, at which time test performance, clinical impressions, and treatment recommendations will be reviewed in detail. The patient understands she can contact our office should she require our assistance before this time.   A total of 120 minutes of billable time were spent with Christine Scott by the technician, including test administration and scoring time. Billing for these services is reflected in Dr. Les Pou note.   This note reflects time spent with the psychometrician and does not include test scores, clinical history, or any interpretations made by Dr. Nicole Kindred. The full report will follow in a separate note.

## 2021-06-03 NOTE — Progress Notes (Addendum)
NEUROPSYCHOLOGICAL TEST SCORES [facility]  Patient Name: Christine Scott MRN: [id number] Date of Birth: 11/19/1941 Age: 80 y.o. Education: 16 years  Measurement properties of test scores: IQ, Index, and Standard Scores (SS): Mean = 100; Standard Deviation = 15 Scaled Scores (Ss): Mean = 10; Standard Deviation = 3 Z scores (Z): Mean = 0; Standard Deviation = 1 T scores (T); Mean = 50; Standard Deviation = 10  TEST SCORES:    Note: This summary of test scores accompanies the interpretive report and should not be interpreted by unqualified individuals or in isolation without reference to the report. Test scores are relative to age, gender, and educational history as available and appropriate.   Performance Validity        "A" Random Letter Test Raw  Descriptor      Errors 0 Within Expectation  The Dot Counting Test: 13 Within Expectation      Embedded Measures: Raw  Descriptor      NAB Effort Index 1 Within Expectation      Mental Status Screening     Total Score Descriptor  MMSE 22 Mild Dementia      Expected Functioning        Wide Range Achievement Test: Standard/Scaled Score Percentile      Word Reading 99 47      Reynolds Intellectual Screening Test Standard/T-score Percentile      Guess What 58 79      Odd Item Out 57 75  RIST Index 114 82      Attention/Processing Speed        Neuropsychological Assessment Battery (Attention Module, Form 1): Scaled/T-score Percentile      Digits Forward 47 38      Digits Backwards 41 18      Dots 35 7      Driving Scenes 37 9      Repeatable Battery for the Assessment of Neuropsychological Status (Form A): Standard Score Percentile     Coding 6 9      Language        Neuropsychological Assessment Battery (Language Module, Form 1): T-score Percentile      Naming   (30) 63 91      Verbal Fluency:  T Score Percentile      Controlled Oral Word Association (F-A-S) 37 9      Semantic Fluency (Animals) 57 75       Memory:        Neuropsychological Assessment Battery (Memory Module, Form 1): T-score/Standard Score Percentile      List Learning           List A Immediate Recall   (7 , 8 , 9) 52 58         List B Immediate Recall   (4) 48 42         List A Short Delayed Recall   (5) 39 14         List A Long Delayed Recall   (4) 36 8         List A Percent Retention   (80 %) --- 46         List A Long Delayed Yes/No Recognition Hits   (12) --- 75         List A Long Delayed Yes/No Recognition False Alarms   (9) --- 18         List A Recognition Discriminability Index --- 25      Story Learning  Immediate Recall   (18, 21) 29 2         Delayed Recall   (20) 31 3         Percent Retention   (95 %) --- 69      Daily Living Memory            Immediate Recall   (20, 5) 27 1          Delayed Recall   (6, 4) 37 9          Percent Retention (111 %) --- 95          Recognition Hits   (7) --- 25      Repeatable Battery for the Assessment of Neuropsychological Status (Form A): Scaled Score Percentile         Figure Recall   (11) 9 37      Visuospatial/Constructional Functioning        Repeatable Battery for the Assessment of Neuropsychological Status (Form A): Standard/Scaled Score Percentile      Visuospatial/Constructional Index 75 5         Figure Copy   (16) 8 25         Judgment of Line Orientation   (8) --- <2      Executive Functioning        Modified [place] Card Sorting Test (MWCST): Standard/T-Score Percentile      Number of Categories Correct 38 12      Number of Perseverative Errors 53 62      Number of Total Errors 43 25      Percent Perseverative Errors 56 73  Executive Function Composite 93 32      Trail Making Test: T-Score Percentile      Part A 46 34      Part B 37 9      Boston Diagnostic Aphasia Exam: Raw Score Scaled Score      Complex Ideational Material 12 12      Clock Drawing Raw Score Descriptor      Command 7 Mild Impairment      Rating Scales          Raw Score Descriptor  Beck Depression Inventory - II 8 Within Normal Limits  Beck Anxiety Inventory 16 Moderate      Clinical Dementia Rating Raw Score Descriptor      Sum of Boxes 1.5 Mild Cognitive Impairment      Global Score 0.5 MCI      Quick Dementia Rating System Raw Score Descriptor      Sum of Boxes 0.5 MCI      Total Score 1 Normal    Zeva Leber V. Roseanne Reno PsyD, ABN Clinical Neuropsychologist

## 2021-06-04 NOTE — Progress Notes (Signed)
NEUROPSYCHOLOGICAL EVALUATION [facility]  Patient Name: Christine Scott MRN: [id number] Date of Birth: 01/07/1941 Age: 80 y.o. Education: 16 years  Clinical Impressions  Christine Scott is a 80 y.o., right-hand dominant, widowed woman with a history of memory and thinking problems noticed mainly by herself over the past year or two. She was not able to provide many examples but did say that she is having difficulties recalling dates, appointments and the like. Her friend Christine CoombsDon who sees her every day stated that she gets anxious but he has not noticed any concerning cognitive changes, all the things she is worried about seem "natural" to him. She has no neuroimaging because she was anxious and wanted to get testing done first.   On neuropsychological testing, there is less than expected performance in several areas although the pattern is ill-defined from a syndromic perspective. She demonstrated weak low average to unusually low performance on indicators of working memory and processing efficiency. Overall visuospatial and constructional functioning was unusually low. Her memory performance, while not concerning for a storage problem, does show some inconsistency and difficulties with encoding of verbal information in the setting of very good retention over time, perhaps on the basis of executive control problems. Performance on dedicated executive indicators was mixed with an overall impression of adequate capacities for the most part. She screened negative for the presence of depression, which is odd given her presentation, but did report a moderate level of anxiety. She is at no more than an MCI level problem based on her CDR and her friends rating of her on the QDRS.   Christine Scott is thus manifesting some mild level of cognitive decline showing on measures of attention/processing efficiency, visuospatial/constructional functioning, and also to some extent on memory measures but those low scores are  most likely secondary to executive control problems. Positively, her naming was very good and her retention of information across time was also good. I am uncertain what is causing her cognitive problems but would note that they are certainly not compelling for prodromal AD. Would recommend that she proceed with MRI of the brain, because vascular disease is one hypothesis. Her anxiety may also benefit from treatment with antidepressant medication, such as an SSRI. Positively, she has stopped drinking so that seems an unlikely explanation. She should follow up for reevaluation in 1 to 2 years.   Diagnostic Impressions: Mild cognitive impairment  Recommendations to be discussed with patient  Your performance and presentation today were consistent with some mild areas of difficulty in several areas. You had less than expected performance on measures of attention and processing efficiency, on measures of memory, and also to some extent on measures of visuospatial/executive function. On memory testing, you did not have difficulties with retention of information and instead had more problems with getting information in and interference. The best diagnosis at this point in time is mild cognitive impairment.   The major difference between mild cognitive impairment (MCI) and dementia is in severity and potential prognosis. Once someone reaches a level of severity adequate to be diagnosed with a dementia, there is usually progression over time, though this may be years. On the other hand, mild cognitive impairment, while a significant risk for dementia in future, does not always progress to dementia, and in some instances stays the same or can even revert to normal. It is important to realize that if MCI is due to underlying Alzheimer's disease, it will most likely progress to dementia eventually. The rate  of conversion to Alzheimer's dementia from amnestic MCI is about 15% per year versus the general population risk  of conversion of 2% per year.   In your case, I am not certain what is causing your mild cognitive impairment. I would like you to know that the type of memory problems you had and overall pattern of cognitive difficulties is not very compelling for something like Alzheimer's disease, which is the most common cause of age-related cognitive decline. Of course it is possible that your difficulties are due to a condition at risk for decline but it is also possible that they are due to something else.   You should proceed with the MRI of your brain. This would help [place] look at any vascular changes that may be contributing and also rule out other etiologies. Also, MRI can sometimes be helpful by showing areas of shrinkage in different parts of the brain if it is present.   I would like to congratulate you on reducing your alcohol use, which is positive. I think there are also a number of other things that you can do to reduce your risk of further cognitive decline.   There is now good quality evidence from at least one large scale study that a modified mediterranean diet may help slow cognitive decline. This is known as the "MIND" diet. The Mind diet is not so much a specific diet as it is a set of recommendations for things that you should and should not eat.   Foods that are ENCOURAGED on the MIND Diet:  Green, leafy vegetables: Aim for six or more servings per week. This includes kale, spinach, cooked greens and salads.  All other vegetables: Try to eat another vegetable in addition to the green leafy vegetables at least once a day. It is best to choose non-starchy vegetables because they have a lot of nutrients with a low number of calories.  Berries: Eat berries at least twice a week. There is a plethora of research on strawberries, and other berries such as blueberries, raspberries and blackberries have also been found to have antioxidant and brain health benefits.  Nuts: Try to get five servings of  nuts or more each week. The creators of the MIND diet don't specify what kind of nuts to consume, but it is probably best to vary the type of nuts you eat to obtain a variety of nutrients. Peanuts are a legume and do not fall into this category.  Olive oil: Use olive oil as your main cooking oil. There may be other heart-healthy alternatives such as algae oil, though there is not yet sufficient research upon which to base a formal recommendation.  Whole grains: Aim for at least three servings daily. Choose minimally processed grains like oatmeal, quinoa, brown rice, whole-wheat pasta and 100% whole-wheat bread.  Fish: Eat fish at least once a week. It is best to choose fatty fish like salmon, sardines, trout, tuna and mackerel for their high amounts of omega-3 fatty acids.  Beans: Include beans in at least four meals every week. This includes all beans, lentils and soybeans.  Poultry: Try to eat chicken or [place] at least twice a week. Note that fried chicken is not encouraged on the MIND diet.  Wine: Aim for no more than one glass of alcohol daily. Both red and white wine may benefit the brain. However, much research has focused on the red wine compound resveratrol, which may help protect against Alzheimer's disease.  Foods that are DISCOURAGED on  the MIND Diet: Butter and margarine: Try to eat less than 1 tablespoon (about 14 grams) daily. Instead, try using olive oil as your primary cooking fat, and dipping your bread in olive oil with herbs.  Cheese: The MIND diet recommends limiting your cheese consumption to less than once per week.  Red meat: Aim for no more than three servings each week. This includes all beef, pork, lamb and products made from these meats.  [place] food: The MIND diet highly discourages fried food, especially the kind from fast-food restaurants. Limit your consumption to less than once per week.  Pastries and sweets: This includes most of the processed junk food and desserts you  can think of. Ice cream, cookies, brownies, snack cakes, donuts, candy and more. Try to limit these to no more than four times a week.  Exercise is one of the best medicines for promoting health and maintaining cognitive fitness at all stages in life. Exercise probably has the largest documented effect on brain health and performance of any lifestyle intervention. Studies have shown that even previously sedentary individuals who start exercising as late as age 26 show a significant survival benefit as compared to their non-exercising peers. In the [place], the current guidelines are for 30 minutes of moderate exercise per day, but increasing your activity level less than that may also be helpful. You do not have to get your 30 minutes of exercise in one shot and exercising for short periods of time spread throughout the day can be helpful. Go for several walks, learn to dance, or do something else you enjoy that gets your body moving. Of course, if you have an underlying medical condition or there is any question about whether it is safe for you to exercise, you should consult a medical treatment provider prior to beginning exercise.   I would like to see you for repeat evaluation in 1 to 2 years. If you do not have any changes that are noticeable to you or those around you, 2 years would be sufficient.  Test Findings  Test scores are summarized in additional documentation associated with this encounter. Test scores are relative to age, gender, and educational history as available and appropriate. There were no concerns about performance validity as all findings fell within normal expectations.   General Intellectual Functioning/Achievement:  Performance on single word reading was average whereas performance on the RIST index was high average. High average range scores were obtained on both the verbally and visually oriented subtests. Her RIST index was used as a basis of comparison for her cognitive  test data.   Attention and Processing Efficiency: Performance on indicators of attention and processing efficiency was below expectations, with weak scores on visually mediated measures and tasks of processing efficiency. Performance on digit repetition forward was average whereas digit repetition backward was low average. Visually mediated indicators involving identifying changes to printed stimulus materials were unusually low for dot patterns and weak (low average almost unusually low) for driving scenes. Performance on a timed number-symbol coding procedure was unusually low.   Language: Performance on language measures showed good, nearly errorless visual object confrontation naming. Generation of words in response to the letters F-A-S was weak and fell at a low average level. Generation of animals in one minute was high average.    Visuospatial Function: Performance on visuospatial and constructional indicators showed middling performance with an overall unusually low performance level on the visuospatial/constructional index of the RBANS. Figure copy was average, although that  is scored liberally. Her gestalt was intact but there were numerous inaccuracies in reproduction and inattention to detail. She had a very hard time with judgment of angular line orientations and scored at an extremely low level. Her responses suggest that she understood the test, she just was unable to do it accurately.   Learning and Memory: Learning and memory measures showed diminished encoding of verbal information in some cases, susceptibility to retroactive interference in others, and in general weak performance. Importantly, her retention of information over time and recognition memory were good, so this is not a clear storage problem.   In the verbal realm, Christine Scott learned 7, 8, and 9 words of a 12-item word list across three learning trials, which is average. She only recalled 5 of these words on long delayed  recall after being read a distractor alternate list, which is low average. She did retain 4 of those words on long delayed recall, which is good retention but unusually low delayed recall. Yes/no recognition for words from the list versus false choices was average. Memory for a short story was extremely low on immediate recall, although once again her retention of information across time was good. Delayed recall was still unusually low, due to the amount of information she recalled. Memory for brief daily living type information was extremely low, largely due to very poor performance learning a name, address, and phone number. Retention of information across time was good and delayed recall was low average. Recognition of the information was average.   Executive Functions: Performance on executive indicators was marginal, albeit not significantly impaired. Some of her test difficulties across the battery suggest the possibility of executive control problems. She performed at an average level on the Modified [employer], a rule-based categorization procedure involving concept formation and cognitive flexibility. Generation of words in response to given letters, on the other hand was at the margin of the low average and unusually low ranges. Alternating sequencing of numbers and letters of the alphabet was low average (almost unusually low). Clock drawing was consistent with mild impairment, with added numbers, missing numbers, and minor errors in the clock hands.   Rating Scale(s): Christine Scott screened negative for the presence of depression. She reported a moderate level of anxiety symptoms. She was characterized as functioning in the normal range by her friend Christine Scott, who noted only minor difficulties with mood and orientation. I rated a CDR for her and she is in the mild cognitive impairment range.   Bettye Boeck Roseanne Reno, PsyD, ABN Clinical Neuropsychologist  Coding and Compliance  Billing below  reflects technician time, my direct face-to-face time with the patient, time spent in test administration, and time spent in professional activities including but not limited to: neuropsychological test interpretation, integration of neuropsychological test data with clinical history, report preparation, treatment planning, care coordination, and review of diagnostically pertinent medical history or studies.   Services associated with this encounter: Clinical Interview [phone]) plus 155 minutes (96132/96133; Neuropsychological Evaluation by Professional)  20 minutes (96136/96137; Test Administration by Professional) 120 minutes (96138/96139; Neuropsychological Testing by Technician)

## 2021-06-10 ENCOUNTER — Encounter: Payer: Self-pay | Admitting: Adult Health

## 2021-06-10 ENCOUNTER — Other Ambulatory Visit: Payer: Self-pay

## 2021-06-10 ENCOUNTER — Ambulatory Visit: Payer: Federal, State, Local not specified - PPO | Admitting: Adult Health

## 2021-06-10 ENCOUNTER — Telehealth: Payer: Self-pay | Admitting: Family Medicine

## 2021-06-10 VITALS — BP 122/58 | HR 75 | Temp 98.1°F | Ht 61.0 in | Wt 127.6 lb

## 2021-06-10 DIAGNOSIS — M25511 Pain in right shoulder: Secondary | ICD-10-CM

## 2021-06-10 DIAGNOSIS — R0600 Dyspnea, unspecified: Secondary | ICD-10-CM | POA: Diagnosis not present

## 2021-06-10 DIAGNOSIS — R0609 Other forms of dyspnea: Secondary | ICD-10-CM

## 2021-06-10 NOTE — Progress Notes (Signed)
@Patient  ID: , female    DOB: 1941-04-03, 80 y.o.   MRN: [id number]  Chief Complaint  Patient presents with   Follow-up     Referring provider: [id number], MD  HPI: 80 year old female never smoker seen for pulmonary consult October 2021 for dyspnea x6 months. Medical history significant for PMR and Sjogrens syndrome  COVID-19 infection September 2021  TEST/EVENTS :   06/10/2021 Follow up : Dyspnea  Patient returns for a 6 month follow up. Has been followed with dyspnea with exertion .  Says dyspnea really started around March 2021.  Had noticed it before then but was very mild.  She says she is pretty active but started to notice that she was having shortness of breath if she tried to go up stairs or an incline.  She was set up for chest x-ray that showed clear lungs.  Was set up for 2D echo showed preserved EF and no acute process.  Walk test in the office showed no desaturations.  Lives alone, drives , does housework and shopping . Walks daily 2 miles /d  Main issues are with incline and stairs. No issues on flat surface .  Patient says overall she does feel that she is some better than last visit.  Still gets some shortness of breath if she tries to go upstairs.  She goes up a set of stairs several times a day.  She also notices if she has to walk up an incline that she will have to stop and rest at the top.  However she is very active has no shortness of breath at rest.  Has no shortness of breath if she tries to walk on a flat surface.  She typically walks daily for exercise around 2 miles. She denies any hemoptysis chest pain orthopnea PND or leg swelling.  Does have occasional dry cough.  No significant wheezing.  No Known Allergies  Immunization History  Administered Date(s) Administered   Fluad Quad(high Dose 65+) 08/28/2019, 11/24/2020   Influenza, High Dose Seasonal PF 10/21/2017, 11/27/2018   Influenza-Unspecified 10/13/2016   PFIZER Comirnaty(Gray  Top)Covid-19 Tri-Sucrose Vaccine 05/31/2021   PFIZER(Purple Top)SARS-COV-2 Vaccination 01/12/2020, 02/05/2020, 11/07/2020   Pneumococcal Conjugate-13 08/17/2018   Pneumococcal Polysaccharide-23 01/19/2016   Tdap 12/26/2009    Past Medical History:  Diagnosis Date   Abnormal Pap smear of cervix    --hx abnormal paps in her 30s and per patient this was reason for her Hysterectomy   Anxiety    Depression    Dry eye    Hypertension    Osteoarthritis    hands   Osteoporosis    Polymyalgia rheumatica (HCC)     Tobacco History: Social History   Tobacco Use  Smoking Status Never  Smokeless Tobacco Never   Counseling given: Not Answered   Outpatient Medications Prior to Visit  Medication Sig Dispense Refill   acetaminophen (TYLENOL) 500 MG tablet Take 1,000 mg by mouth at bedtime.     amLODipine (NORVASC) 10 MG tablet Take 1 tablet (10 mg total) by mouth daily. 90 tablet 3   B Complex-Biotin-FA (B-COMPLEX PO) Take 1 tablet by mouth daily. Super     Calcium Carb-Cholecalciferol (CALCIUM CARBONATE-VITAMIN D3) 600-400 MG-UNIT TABS Take 1 tablet by mouth daily.      Cholecalciferol (VITAMIN D3) 50 MCG (2000 UT) TABS Take 2,000 mg by mouth daily.     conjugated estrogens (PREMARIN) vaginal cream Place 1/2 gram per vagina at bedtime twice weekly. 30 g 1  COVID-19 mRNA Vac-TriS, Pfizer, (PFIZER-BIONT COVID-19 VAC-TRIS) SUSP injection Inject into the muscle. 0.3 mL 0   lamoTRIgine (LAMICTAL) 100 MG tablet TAKE ONE TABLET BY MOUTH DAILY 90 tablet 1   Multiple Vitamins-Minerals (MULTIVITAMIN PO) Take 1 tablet by mouth daily. Centrum silver     Omega-3 Fatty Acids (FISH OIL) 1000 MG CAPS Take 1,000 mg by mouth daily.      Polyethyl Glycol-Propyl Glycol (SYSTANE ULTRA OP) Place 1 drop into both eyes in the morning and at bedtime.      White Petrolatum-Mineral Oil (SYSTANE NIGHTTIME) OINT Place 1 application into both eyes at bedtime as needed (Dry eye).      No facility-administered  medications prior to visit.     Review of Systems:   Constitutional:   No  weight loss, night sweats,  Fevers, chills, fatigue, or  lassitude.  HEENT:   No headaches,  Difficulty swallowing,  Tooth/dental problems, or  Sore throat,                No sneezing, itching, ear ache, nasal congestion, post nasal drip,   CV:  No chest pain,  Orthopnea, PND, swelling in lower extremities, anasarca, dizziness, palpitations, syncope.   GI  No heartburn, indigestion, abdominal pain, nausea, vomiting, diarrhea, change in bowel habits, loss of appetite, bloody stools.   Resp:   No excess mucus, no productive cough,  No non-productive cough,  No coughing up of blood.  No change in color of mucus.  No wheezing.  No chest wall deformity  Skin: no rash or lesions.  GU: no dysuria, change in color of urine, no urgency or frequency.  No flank pain, no hematuria   MS:  No joint pain or swelling.  No decreased range of motion.  No back pain.    Physical Exam  BP (!) 122/58 (BP Location: Left Arm, Patient Position: Sitting, Cuff Size: Normal)   Pulse 75   Temp 98.1 F (36.7 C) (Temporal)   Ht 5\' 1"  (1.549 m)   Wt 127 lb 9.6 oz (57.9 kg)   LMP  (LMP Unknown)   SpO2 98%   BMI 24.11 kg/m   GEN: A/Ox3; pleasant , NAD, well nourished    HEENT:  [place]/AT,   NOSE-clear, THROAT-clear, no lesions, no postnasal drip or exudate noted.   NECK:  Supple w/ fair ROM; no JVD; normal carotid impulses w/o bruits; no thyromegaly or nodules palpated; no lymphadenopathy.    RESP  Clear  P & A; w/o, wheezes/ rales/ or rhonchi. no accessory muscle use, no dullness to percussion  CARD:  RRR, no m/r/g, no peripheral edema, pulses intact, no cyanosis or clubbing.  GI:   Soft & nt; nml bowel sounds; no organomegaly or masses detected.   Musco: Warm bil, no deformities or joint swelling noted.   Neuro: alert, no focal deficits noted.    Skin: Warm, no lesions or rashes    Lab Results:  CBC    Component  Value Date/Time   WBC 5.8 04/15/2021 1022   RBC 3.95 04/15/2021 1022   HGB 11.7 (L) 04/15/2021 1022   HCT 36.0 04/15/2021 1022   PLT 213.0 04/15/2021 1022   MCV 91.2 04/15/2021 1022   MCH 30.5 04/13/2020 0807   MCHC 32.6 04/15/2021 1022   RDW 14.3 04/15/2021 1022   LYMPHSABS 0.7 04/15/2021 1022   MONOABS 0.3 04/15/2021 1022   EOSABS 0.0 04/15/2021 1022   BASOSABS 0.0 04/15/2021 1022    BMET    Component  Value Date/Time   NA 136 04/15/2021 1022   K 4.2 04/15/2021 1022   CL 101 04/15/2021 1022   CO2 26 04/15/2021 1022   GLUCOSE 95 04/15/2021 1022   BUN 15 04/15/2021 1022   CREATININE 0.75 04/15/2021 1022   CREATININE 0.99 (H) 02/25/2019 1214   CALCIUM 9.2 04/15/2021 1022   GFRNONAA >60 04/13/2020 0807   GFRNONAA 55 (L) 02/25/2019 1214   GFRAA >60 04/13/2020 0807   GFRAA 63 02/25/2019 1214    BNP No results found for: BNP  ProBNP No results found for: PROBNP  Imaging:  No flowsheet data found.  No results found for: NITRICOXIDE      Assessment & Plan:   Dyspnea on exertion Dyspnea with exertion questionable etiology suspect may be a component of mild deconditioning however patient is quite active for her age. 2D echo was essentially normal, nothing to explain her shortness of breath.  Previous chest x-ray was clear.  Walk test in the office with no desaturations. We will have her return for pulmonary function testing.  She does have a history of autoimmune disorder with Sjogren syndrome.  If her DLCO is decreased or she has restriction could consider high-resolution CT chest to rule out possible underlying interstitial process. Symptoms seem to be mild in nature.  She has no associated chest pain.  I have asked her to continue with activity as tolerated.  Plan  Patient Instructions  Activity as tolerated.  Follow up with in 3 weeks with PFT with Dr. Vassie Loll  or Chaze Hruska NP .       I spent   30 minutes dedicated to the care of this patient on the date of this  encounter to include pre-visit review of records, face-to-face time with the patient discussing conditions above, post visit ordering of testing, clinical documentation with the electronic health record, making appropriate referrals as documented, and communicating necessary findings to members of the patients care team.   Rubye Oaks, NP 06/10/2021

## 2021-06-10 NOTE — Telephone Encounter (Signed)
Referral has been sent.

## 2021-06-10 NOTE — Patient Instructions (Signed)
Activity as tolerated.  Follow up with in 3 weeks with PFT with Dr. Vassie Loll  or Desteny Freeman NP .

## 2021-06-10 NOTE — Telephone Encounter (Signed)
Patient said she received a message from [place] asking where she wanted to attend PT.  She is interested in seeing Lauren at [facility].

## 2021-06-10 NOTE — Assessment & Plan Note (Signed)
Dyspnea with exertion questionable etiology suspect may be a component of mild deconditioning however patient is quite active for her age. 2D echo was essentially normal, nothing to explain her shortness of breath.  Previous chest x-ray was clear.  Walk test in the office with no desaturations. We will have her return for pulmonary function testing.  She does have a history of autoimmune disorder with Sjogren syndrome.  If her DLCO is decreased or she has restriction could consider high-resolution CT chest to rule out possible underlying interstitial process. Symptoms seem to be mild in nature.  She has no associated chest pain.  I have asked her to continue with activity as tolerated.  Plan  Patient Instructions  Activity as tolerated.  Follow up with in 3 weeks with PFT with Dr. Vassie Loll  or Garrin Kirwan NP .

## 2021-06-15 ENCOUNTER — Ambulatory Visit (INDEPENDENT_AMBULATORY_CARE_PROVIDER_SITE_OTHER): Payer: Federal, State, Local not specified - PPO | Admitting: Counselor

## 2021-06-15 ENCOUNTER — Other Ambulatory Visit: Payer: Self-pay

## 2021-06-15 ENCOUNTER — Encounter: Payer: Self-pay | Admitting: Counselor

## 2021-06-15 DIAGNOSIS — G3184 Mild cognitive impairment, so stated: Secondary | ICD-10-CM | POA: Diagnosis not present

## 2021-06-15 NOTE — Patient Instructions (Signed)
Your performance and presentation today were consistent with some mild areas of difficulty in several areas. You had less than expected performance on measures of attention and processing efficiency, on measures of memory, and also to some extent on measures of visuospatial/executive function. On memory testing, you did not have difficulties with retention of information and instead had more problems with getting information in and interference. The best diagnosis at this point in time is mild cognitive impairment.   The major difference between mild cognitive impairment (MCI) and dementia is in severity and potential prognosis. Once someone reaches a level of severity adequate to be diagnosed with a dementia, there is usually progression over time, though this may be years. On the other hand, mild cognitive impairment, while a significant risk for dementia in future, does not always progress to dementia, and in some instances stays the same or can even revert to normal. It is important to realize that if MCI is due to underlying Alzheimer's disease, it will most likely progress to dementia eventually. The rate of conversion to Alzheimer's dementia from amnestic MCI is about 15% per year versus the general population risk of conversion of 2% per year.    In your case, I am not certain what is causing your mild cognitive impairment. I would like you to know that the type of memory problems you had and overall pattern of cognitive difficulties is not very compelling for something like Alzheimer's disease, which is the most common cause of age-related cognitive decline. Of course it is possible that your difficulties are due to a condition at risk for decline but it is also possible that they are due to something else.   You should proceed with the MRI of your brain. This would help [place] look at any vascular changes that may be contributing and also rule out other etiologies. Also, MRI can sometimes be helpful by  showing areas of shrinkage in different parts of the brain if it is present.   I would like to congratulate you on reducing your alcohol use, which is positive. I think there are also a number of other things that you can do to reduce your risk of further cognitive decline.   There is now good quality evidence from at least one large scale study that a modified mediterranean diet may help slow cognitive decline. This is known as the "MIND" diet. The Mind diet is not so much a specific diet as it is a set of recommendations for things that you should and should not eat.   Foods that are ENCOURAGED on the MIND Diet:  Green, leafy vegetables: Aim for six or more servings per week. This includes kale, spinach, cooked greens and salads. All other vegetables: Try to eat another vegetable in addition to the green leafy vegetables at least once a day. It is best to choose non-starchy vegetables because they have a lot of nutrients with a low number of calories. Berries: Eat berries at least twice a week. There is a plethora of research on strawberries, and other berries such as blueberries, raspberries and blackberries have also been found to have antioxidant and brain health benefits. Nuts: Try to get five servings of nuts or more each week. The creators of the MIND diet don't specify what kind of nuts to consume, but it is probably best to vary the type of nuts you eat to obtain a variety of nutrients. Peanuts are a legume and do not fall into this category. Olive oil:  Use olive oil as your main cooking oil. There may be other heart-healthy alternatives such as algae oil, though there is not yet sufficient research upon which to base a formal recommendation. Whole grains: Aim for at least three servings daily. Choose minimally processed grains like oatmeal, quinoa, brown rice, whole-wheat pasta and 100% whole-wheat bread. Fish: Eat fish at least once a week. It is best to choose fatty fish like salmon,  sardines, trout, tuna and mackerel for their high amounts of omega-3 fatty acids. Beans: Include beans in at least four meals every week. This includes all beans, lentils and soybeans. Poultry: Try to eat chicken or [place] at least twice a week. Note that fried chicken is not encouraged on the MIND diet. Wine: Aim for no more than one glass of alcohol daily. Both red and white wine may benefit the brain. However, much research has focused on the red wine compound resveratrol, which may help protect against Alzheimer's disease.  Foods that are DISCOURAGED on the MIND Diet: Butter and margarine: Try to eat less than 1 tablespoon (about 14 grams) daily. Instead, try using olive oil as your primary cooking fat, and dipping your bread in olive oil with herbs. Cheese: The MIND diet recommends limiting your cheese consumption to less than once per week. Red meat: Aim for no more than three servings each week. This includes all beef, pork, lamb and products made from these meats. [place] food: The MIND diet highly discourages fried food, especially the kind from fast-food restaurants. Limit your consumption to less than once per week. Pastries and sweets: This includes most of the processed junk food and desserts you can think of. Ice cream, cookies, brownies, snack cakes, donuts, candy and more. Try to limit these to no more than four times a week.   Exercise is one of the best medicines for promoting health and maintaining cognitive fitness at all stages in life. Exercise probably has the largest documented effect on brain health and performance of any lifestyle intervention. Studies have shown that even previously sedentary individuals who start exercising as late as age 34 show a significant survival benefit as compared to their non-exercising peers. In the [place], the current guidelines are for 30 minutes of moderate exercise per day, but increasing your activity level less than that may also be  helpful. You do not have to get your 30 minutes of exercise in one shot and exercising for short periods of time spread throughout the day can be helpful. Go for several walks, learn to dance, or do something else you enjoy that gets your body moving. Of course, if you have an underlying medical condition or there is any question about whether it is safe for you to exercise, you should consult a medical treatment provider prior to beginning exercise.   I would like to see you for repeat evaluation in 1 to 2 years. If you do not have any changes that are noticeable to you or those around you, 2 years would be sufficient.

## 2021-06-15 NOTE — Progress Notes (Signed)
NEUROPSYCHOLOGY FEEDBACK NOTE [facility]  Feedback Note: I met with Kendel Pesnell Fischel to review the findings resulting from her neuropsychological evaluation. Since the last appointment, she has been about the same. She is planning on traveling to a play house to see a show this weekend, which she is looking forward to. Time was spent reviewing the impressions and recommendations that are detailed in the evaluation report. We discussed impression of mild cognitive impairment, albeit with a pattern that is not particularly concerning and if anything argues against AD, which is by far the biggest concern at her age. I encouraged her to look at this as a risk factor. We also discussed her medications for mood, she tried numerous SSRIs and eventually arrived at lamotrigine. We considered psychiatry referral but she does not have a PCP, I am hesitant to refer until she reestablishes, because she will need someone to coordinate care. She will work on that. Other topics of discussion as reflected in the patient instructions. I took time to explain the findings and answer all the patient's questions. I encouraged Ms. Hartsough to contact me should she have any further questions or if further follow up is desired.   Current Medications and Medical History   Current Outpatient Medications  Medication Sig Dispense Refill   acetaminophen (TYLENOL) 500 MG tablet Take 1,000 mg by mouth at bedtime.     amLODipine (NORVASC) 10 MG tablet Take 1 tablet (10 mg total) by mouth daily. 90 tablet 3   B Complex-Biotin-FA (B-COMPLEX PO) Take 1 tablet by mouth daily. Super     Calcium Carb-Cholecalciferol (CALCIUM CARBONATE-VITAMIN D3) 600-400 MG-UNIT TABS Take 1 tablet by mouth daily.      Cholecalciferol (VITAMIN D3) 50 MCG (2000 UT) TABS Take 2,000 mg by mouth daily.     conjugated estrogens (PREMARIN) vaginal cream Place 1/2 gram per vagina at bedtime twice weekly. 30 g 1   COVID-19 mRNA Vac-TriS, Pfizer, (PFIZER-BIONT  COVID-19 VAC-TRIS) SUSP injection Inject into the muscle. 0.3 mL 0   lamoTRIgine (LAMICTAL) 100 MG tablet TAKE ONE TABLET BY MOUTH DAILY 90 tablet 1   Multiple Vitamins-Minerals (MULTIVITAMIN PO) Take 1 tablet by mouth daily. Centrum silver     Omega-3 Fatty Acids (FISH OIL) 1000 MG CAPS Take 1,000 mg by mouth daily.      Polyethyl Glycol-Propyl Glycol (SYSTANE ULTRA OP) Place 1 drop into both eyes in the morning and at bedtime.      White Petrolatum-Mineral Oil (SYSTANE NIGHTTIME) OINT Place 1 application into both eyes at bedtime as needed (Dry eye).      No current facility-administered medications for this visit.    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 Speech was normal in rate, rhythm, volume, and prosody. Self-reported mood was "good" and affect was mainly  neutral. Thought process was logical and goal oriented and thought content was appropriate to the topics discussed. There were no safety concerns identified at today's encounter, such as thoughts of harming self or others.   Plan  Feedback provided regarding the patient's neuropsychological evaluation. She has mild cognitive impairment, although in her case it is rather mild and does not have a  pattern very concerning for AD. She will return to clinic in 2 years or sooner if needed. She is motivated to exercise, she's already walking and will start exercising more. She will implement MIND diet and already is eating healthily. Kale Dols Dewilde was encouraged to contact me if any questions arise or if further follow up is desired.   Viviano Simas Nicole Kindred, PsyD, ABN Clinical Neuropsychologist  Service(s) Provided at This Encounter: 32 minutes [phone]; Psychotherapy with patient/family)

## 2021-06-23 ENCOUNTER — Ambulatory Visit: Payer: Federal, State, Local not specified - PPO | Admitting: Physical Therapy

## 2021-06-23 ENCOUNTER — Encounter: Payer: Self-pay | Admitting: Physical Therapy

## 2021-06-23 ENCOUNTER — Other Ambulatory Visit: Payer: Self-pay

## 2021-06-23 DIAGNOSIS — M25511 Pain in right shoulder: Secondary | ICD-10-CM | POA: Diagnosis not present

## 2021-06-23 DIAGNOSIS — G8929 Other chronic pain: Secondary | ICD-10-CM | POA: Diagnosis not present

## 2021-06-23 DIAGNOSIS — M542 Cervicalgia: Secondary | ICD-10-CM
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
## 2021-06-23 NOTE — Patient Instructions (Signed)
Access Code: [email or web address] URL: [email or web address] Date: 06/23/2021 Prepared by: Sedalia Muta  Exercises Seated Scapular Retraction - 2 x daily - 1 sets - 10 reps Sidelying Shoulder External Rotation - 1 x daily - 1-2 sets - 10 reps Seated Cervical Sidebending Stretch - 2 x daily - 3 reps - 30 hold

## 2021-06-30 ENCOUNTER — Other Ambulatory Visit: Payer: Self-pay

## 2021-06-30 ENCOUNTER — Ambulatory Visit (INDEPENDENT_AMBULATORY_CARE_PROVIDER_SITE_OTHER): Payer: Federal, State, Local not specified - PPO | Admitting: Pulmonary Disease

## 2021-06-30 ENCOUNTER — Encounter: Payer: Self-pay | Admitting: Adult Health

## 2021-06-30 ENCOUNTER — Ambulatory Visit: Payer: Federal, State, Local not specified - PPO | Admitting: Adult Health

## 2021-06-30 VITALS — BP 160/70 | HR 89 | Temp 97.6°F

## 2021-06-30 DIAGNOSIS — R0609 Other forms of dyspnea: Secondary | ICD-10-CM

## 2021-06-30 DIAGNOSIS — R0600 Dyspnea, unspecified: Secondary | ICD-10-CM

## 2021-06-30 LAB — PULMONARY FUNCTION TEST
DL/VA % pred: 106 %
DL/VA: 4.43 ml/min/mmHg/L
DLCO cor % pred: 106 %
DLCO cor: 17.9 ml/min/mmHg
DLCO unc % pred: 106 %
DLCO unc: 17.9 ml/min/mmHg
FEF 25-75 Post: 2.37 L/sec
FEF 25-75 Pre: 2.11 L/sec
FEF2575-%Change-Post: 11 %
FEF2575-%Pred-Post: 199 %
FEF2575-%Pred-Pre: 177 %
FEV1-%Change-Post: 3 %
FEV1-%Pred-Post: 134 %
FEV1-%Pred-Pre: 130 %
FEV1-Post: 2.19 L
FEV1-Pre: 2.12 L
FEV1FVC-%Change-Post: 4 %
FEV1FVC-%Pred-Pre: 107 %
FEV6-%Change-Post: 0 %
FEV6-%Pred-Post: 127 %
FEV6-%Pred-Pre: 128 %
FEV6-Post: 2.64 L
FEV6-Pre: 2.66 L
FEV6FVC-%Pred-Post: 106 %
FEV6FVC-%Pred-Pre: 106 %
FVC-%Change-Post: -1 %
FVC-%Pred-Post: 119 %
FVC-%Pred-Pre: 121 %
FVC-Post: 2.64 L
FVC-Pre: 2.68 L
Post FEV1/FVC ratio: 83 %
Post FEV6/FVC ratio: 100 %
Pre FEV1/FVC ratio: 79 %
Pre FEV6/FVC Ratio: 100 %
RV % pred: 77 %
RV: 1.75 L
TLC % pred: 94 %
TLC: 4.35 L

## 2021-06-30 MED ORDER — ALBUTEROL SULFATE HFA 108 (90 BASE) MCG/ACT IN AERS: 1.0000 | INHALATION_SPRAY | Freq: Four times a day (QID) | RESPIRATORY_TRACT | 2 refills | Status: DC | PRN

## 2021-06-30 NOTE — Patient Instructions (Signed)
Full PFT performed today. °

## 2021-06-30 NOTE — Progress Notes (Signed)
@Patient  ID: , female    DOB: 08-27-1941, 80 y.o.   MRN: [id number]  Chief Complaint  Patient presents with   Follow-up    Referring provider: [id number], MD  HPI: 80 yo female never smoker seen for pulmonary consult October 2021 for dyspnea with exertion for 6 months.  Medical history significant for PMR and Sjogrens Syndrome  Covid 19 infection 08/2020   TEST/EVENTS :   06/30/2021 Follow up : Dyspnea  Patient returns for a 1 month follow up . Has been followed for dyspnea that first noticed March 2021 . Describes as mild mainly only with steps and inclines. She is very active walks daily . She is independent. Workup showed normal echo and chest xray. Walk test with no desaturations .  No cough or wheezing . No chest pain . No palpitations, syncope or edema.  She was set up for PFT-completed today showed normal lung function with FEV1 at 134%, ratio 83, FVC 119% and DLCO 106%, no BD response .  Had stress test years ago reported as negative. Has strong family history of CAD. Personal history of Hypertension and Hyperlipidemia .   No Known Allergies  Immunization History  Administered Date(s) Administered   Fluad Quad(high Dose 65+) 08/28/2019, 11/24/2020   Influenza, High Dose Seasonal PF 10/21/2017, 11/27/2018   Influenza-Unspecified 10/13/2016   PFIZER Comirnaty(Gray Top)Covid-19 Tri-Sucrose Vaccine 05/31/2021   PFIZER(Purple Top)SARS-COV-2 Vaccination 01/12/2020, 02/05/2020, 11/07/2020   Pneumococcal Conjugate-13 08/17/2018   Pneumococcal Polysaccharide-23 01/19/2016   Tdap 12/26/2009    Past Medical History:  Diagnosis Date   Abnormal Pap smear of cervix    --hx abnormal paps in her 30s and per patient this was reason for her Hysterectomy   Anxiety    Depression    Dry eye    Hypertension    Osteoarthritis    hands   Osteoporosis    Polymyalgia rheumatica (HCC)     Tobacco History: Social History   Tobacco Use  Smoking Status Never   Smokeless Tobacco Never   Counseling given: Not Answered   Outpatient Medications Prior to Visit  Medication Sig Dispense Refill   acetaminophen (TYLENOL) 500 MG tablet Take 1,000 mg by mouth at bedtime.     amLODipine (NORVASC) 10 MG tablet Take 1 tablet (10 mg total) by mouth daily. 90 tablet 3   B Complex-Biotin-FA (B-COMPLEX PO) Take 1 tablet by mouth daily. Super     Calcium Carb-Cholecalciferol (CALCIUM CARBONATE-VITAMIN D3) 600-400 MG-UNIT TABS Take 1 tablet by mouth daily.      Cholecalciferol (VITAMIN D3) 50 MCG (2000 UT) TABS Take 2,000 mg by mouth daily.     conjugated estrogens (PREMARIN) vaginal cream Place 1/2 gram per vagina at bedtime twice weekly. 30 g 1   COVID-19 mRNA Vac-TriS, Pfizer, (PFIZER-BIONT COVID-19 VAC-TRIS) SUSP injection Inject into the muscle. 0.3 mL 0   lamoTRIgine (LAMICTAL) 100 MG tablet TAKE ONE TABLET BY MOUTH DAILY 90 tablet 1   Multiple Vitamins-Minerals (MULTIVITAMIN PO) Take 1 tablet by mouth daily. Centrum silver     Omega-3 Fatty Acids (FISH OIL) 1000 MG CAPS Take 1,000 mg by mouth daily.      Polyethyl Glycol-Propyl Glycol (SYSTANE ULTRA OP) Place 1 drop into both eyes in the morning and at bedtime.      White Petrolatum-Mineral Oil (SYSTANE NIGHTTIME) OINT Place 1 application into both eyes at bedtime as needed (Dry eye).      No facility-administered medications prior to visit.  Review of Systems:   Constitutional:   No  weight loss, night sweats,  Fevers, chills, fatigue, or  lassitude.  HEENT:   No headaches,  Difficulty swallowing,  Tooth/dental problems, or  Sore throat,                No sneezing, itching, ear ache, nasal congestion, post nasal drip,   CV:  No chest pain,  Orthopnea, PND, swelling in lower extremities, anasarca, dizziness, palpitations, syncope.   GI  No heartburn, indigestion, abdominal pain, nausea, vomiting, diarrhea, change in bowel habits, loss of appetite, bloody stools.   Resp:   No excess mucus, no  productive cough,  No non-productive cough,  No coughing up of blood.  No change in color of mucus.  No wheezing.  No chest wall deformity  Skin: no rash or lesions.  GU: no dysuria, change in color of urine, no urgency or frequency.  No flank pain, no hematuria   MS:  No joint pain or swelling.  No decreased range of motion.  No back pain.    Physical Exam  BP (!) 160/70 (BP Location: Left Arm, Patient Position: Sitting, Cuff Size: Normal)   Pulse 89   Temp 97.6 F (36.4 C) (Oral)   LMP  (LMP Unknown)   SpO2 97%   GEN: A/Ox3; pleasant , NAD, well nourished    HEENT:  [place]/AT,   NOSE-clear, THROAT-clear, no lesions, no postnasal drip or exudate noted.   NECK:  Supple w/ fair ROM; no JVD; normal carotid impulses w/o bruits; no thyromegaly or nodules palpated; no lymphadenopathy.    RESP  Clear  P & A; w/o, wheezes/ rales/ or rhonchi. no accessory muscle use, no dullness to percussion  CARD:  RRR, no m/r/g, no peripheral edema, pulses intact, no cyanosis or clubbing.  GI:   Soft & nt; nml bowel sounds; no organomegaly or masses detected.   Musco: Warm bil, no deformities or joint swelling noted.   Neuro: alert, no focal deficits noted.    Skin: Warm, no lesions or rashes    Lab Results:  CBC    Component Value Date/Time   WBC 5.8 04/15/2021 1022   RBC 3.95 04/15/2021 1022   HGB 11.7 (L) 04/15/2021 1022   HCT 36.0 04/15/2021 1022   PLT 213.0 04/15/2021 1022   MCV 91.2 04/15/2021 1022   MCH 30.5 04/13/2020 0807   MCHC 32.6 04/15/2021 1022   RDW 14.3 04/15/2021 1022   LYMPHSABS 0.7 04/15/2021 1022   MONOABS 0.3 04/15/2021 1022   EOSABS 0.0 04/15/2021 1022   BASOSABS 0.0 04/15/2021 1022    BMET    Component Value Date/Time   NA 136 04/15/2021 1022   K 4.2 04/15/2021 1022   CL 101 04/15/2021 1022   CO2 26 04/15/2021 1022   GLUCOSE 95 04/15/2021 1022   BUN 15 04/15/2021 1022   CREATININE 0.75 04/15/2021 1022   CREATININE 0.99 (H) 02/25/2019 1214   CALCIUM  9.2 04/15/2021 1022   GFRNONAA >60 04/13/2020 0807   GFRNONAA 55 (L) 02/25/2019 1214   GFRAA >60 04/13/2020 0807   GFRAA 63 02/25/2019 1214    BNP No results found for: BNP  ProBNP No results found for: PROBNP  Imaging: No results found.    PFT Results Latest Ref Rng & Units 06/30/2021  FVC-Pre L 2.68  FVC-Predicted Pre % 121  FVC-Post L 2.64  FVC-Predicted Post % 119  Pre FEV1/FVC % % 79  Post FEV1/FCV % % 83  FEV1-Pre  L 2.12  FEV1-Predicted Pre % 130  FEV1-Post L 2.19  DLCO uncorrected ml/min/mmHg 17.90  DLCO UNC% % 106  DLCO corrected ml/min/mmHg 17.90  DLCO COR %Predicted % 106  DLVA Predicted % 106  TLC L 4.35  TLC % Predicted % 94  RV % Predicted % 77    No results found for: NITRICOXIDE      Assessment & Plan:   No problem-specific Assessment & Plan notes found for this encounter.     Rubye Oaks, NP 06/30/2021

## 2021-06-30 NOTE — Patient Instructions (Signed)
Albuterol inhaler 1-2 puffs every 4-6 hrs as needed for shortness of breath /wheezing .  Activity as tolerated.  Refer to cardiology -shortness of breath with activity .  Follow up with Dr. Vassie Loll  or Bettyjo Lundblad NP in 3 months and As needed

## 2021-06-30 NOTE — Progress Notes (Signed)
Full PFT performed today. °

## 2021-07-01 ENCOUNTER — Ambulatory Visit: Payer: Federal, State, Local not specified - PPO | Admitting: Physical Therapy

## 2021-07-01 ENCOUNTER — Encounter: Payer: Self-pay | Admitting: Physical Therapy

## 2021-07-01 ENCOUNTER — Ambulatory Visit: Payer: Federal, State, Local not specified - PPO | Admitting: Family Medicine

## 2021-07-01 DIAGNOSIS — M25511 Pain in right shoulder: Secondary | ICD-10-CM

## 2021-07-01 DIAGNOSIS — G8929 Other chronic pain: Secondary | ICD-10-CM | POA: Diagnosis not present

## 2021-07-01 DIAGNOSIS — M542 Cervicalgia: Secondary | ICD-10-CM | POA: Diagnosis not present

## 2021-07-01 NOTE — Therapy (Signed)
[facility] [address] [place], [place], [address] Phone: [phone]   Fax:  [phone]  Physical Therapy Treatment  Patient Details  Name: Christine Scott MRN: [id number] Date of Birth: 1941/10/17 Referring Provider (PT): Clementeen Graham   Encounter Date: 07/01/2021   PT End of Session - 07/01/21 1155     Visit Number 2    Number of Visits 12    Date for PT Re-Evaluation 08/04/21    Authorization Type BCBS    PT Start Time 1105    PT Stop Time 1144    PT Time Calculation (min) 39 min    Activity Tolerance Patient tolerated treatment well    Behavior During Therapy WFL for tasks assessed/performed             Past Medical History:  Diagnosis Date   Abnormal Pap smear of cervix    --hx abnormal paps in her 30s and per patient this was reason for her Hysterectomy   Anxiety    Depression    Dry eye    Hypertension    Osteoarthritis    hands   Osteoporosis    Polymyalgia rheumatica (HCC)     Past Surgical History:  Procedure Laterality Date   ABDOMINAL HYSTERECTOMY     BREAST SURGERY  2016   benign mass removed in [place], [place].   FACIAL COSMETIC SURGERY     REVERSE SHOULDER ARTHROPLASTY Left 04/16/2020   Procedure: REVERSE SHOULDER ARTHROPLASTY;  Surgeon: Francena Hanly, MD;  Location: [facility];  Service: Orthopedics;  Laterality: Left;    TOTAL VAGINAL HYSTERECTOMY  age 80   --due to abnormal pap smears per patient    There were no vitals filed for this visit.   Subjective Assessment - 07/01/21 1154     Subjective Pt states R shoulder with minimal pain. L neck is very painful and bothersome.    Patient Stated Goals decreased pain    Currently in Pain? Yes    Pain Score 2     Pain Location Shoulder    Pain Orientation Right    Pain Descriptors / Indicators Aching    Pain Type Chronic pain    Pain Onset More than a month ago    Pain Frequency Intermittent    Pain Score 8    Pain Location Neck    Pain  Orientation Left    Pain Descriptors / Indicators Aching    Pain Type Chronic pain    Pain Onset More than a month ago    Pain Frequency Intermittent                               [facility] Adult PT Treatment/Exercise - 07/01/21 0001       Shoulder Exercises: Supine   External Rotation 15 reps    External Rotation Weight (lbs) 2    Other Supine Exercises Chest press 2 lb cane x 15;      Shoulder Exercises: Sidelying   External Rotation 15 reps;AROM      Shoulder Exercises: Standing   Retraction 20 reps      Shoulder Exercises: Pulleys   Flexion 2 minutes      Shoulder Exercises: Stretch   Other Shoulder Stretches UT stretch 30 sec x 2 bil;      Manual Therapy   Manual Therapy Joint mobilization;Soft tissue mobilization;Passive ROM;Manual Traction    Joint Mobilization cervical PA mobs  Soft tissue mobilization STM/TPR to L UT, levator, scalenes, SOR    Passive ROM manual stretching for UTs ,  cervical rotation    Manual Traction Cervcial, light, 10 sec x 10 for SO stretch;                    PT Education - 07/01/21 1154     Education Details Reviewed HEP, discussed scoliosis/posture effecting back pain onL    Person(s) Educated Patient    Methods Explanation;Demonstration;Tactile cues;Verbal cues;Handout    Comprehension Verbalized understanding;Returned demonstration;Verbal cues required;Tactile cues required;Need further instruction              PT Short Term Goals - 06/23/21 1424       PT SHORT TERM GOAL #1   Title Pt to be independent with intial HEP    Time 2    Period Weeks    Status New    Target Date 07/07/21               PT Long Term Goals - 06/23/21 1425       PT LONG TERM GOAL #1   Title Pt to be independent with final HEP    Time 6    Period Weeks    Status New    Target Date 08/04/21      PT LONG TERM GOAL #2   Title Pt to report decreased pain in R shoulder and neck to 0-2/10 with activity,  reaching, and IADLs.    Time 6    Period Weeks    Status New    Target Date 08/04/21      PT LONG TERM GOAL #3   Title Pt to demo increased strength of R shoulder, to be at least 4+/5 to improve ability for lifting, reaching, carrying and IADLS.    Time 6    Period Weeks    Status New    Target Date 08/04/21                   Plan - 07/01/21 1156     Clinical Impression Statement Pt with minimal soreness in R shoulder today. Does have muscle tension in L UT region that is creating pain and motion limitations in neck. Pt will benefit from Dry needling, discussed today, pt asks to wait until next visit. Manual tissue release done for L cervical musculture. Expect some increased tension due to previous L TSA, and decreased shoulder ROM for elevation, creating compensation. Pt also with noted scoliosis, with increased L trunk SB, discussed posture with pt today, and effects posture have on pain, pt getting pain into L low back/ SI/Glute. Plan to continue manual for cervical muscle tension and pain, and progress strengthening as tolerated.    Examination-Activity Limitations Sleep;Lift;Reach Overhead;Carry;Dressing    Examination-Participation Restrictions Cleaning;Community Activity;Shop;Driving;Laundry;Meal Prep    Stability/Clinical Decision Making Stable/Uncomplicated    Rehab Potential Good    PT Frequency 2x / week    PT Duration 6 weeks    PT Treatment/Interventions ADLs/Self Care Home Management;Cryotherapy;Iontophoresis 4mg /ml Dexamethasone;Therapeutic exercise;Therapeutic activities;Electrical Stimulation;Functional mobility training;Stair training;Gait training;DME Instruction;Ultrasound;Neuromuscular re-education;Cognitive remediation;Patient/family education;Orthotic Fit/Training;Manual techniques;Taping;Vasopneumatic Device;Dry needling;Passive range of motion;Spinal Manipulations;Joint Manipulations    PT Home Exercise Plan [email or web address]    Consulted and Agree with Plan of  Care Patient             Patient will benefit from skilled therapeutic intervention in order to improve the following deficits and impairments:  Hypomobility, Decreased activity tolerance, Decreased  strength, Pain, Increased muscle spasms, Decreased mobility, Decreased range of motion, Improper body mechanics, Impaired flexibility  Visit Diagnosis: Chronic right shoulder pain  Cervicalgia     Problem List 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   Greater trochanteric bursitis of left hip 07/04/2019   DDD (degenerative disc disease), lumbar 02/25/2019   Bipolar II disorder (HCC) 12/07/2018   Left rotator cuff tear arthropathy 12/06/2018   History of depression 08/12/2017   Essential hypertension 08/12/2017   Lumbar radiculopathy 06/15/2017   Sjogren's syndrome 12/25/2016   Polymyalgia rheumatica (HCC) 12/25/2016   Primary osteoarthritis of both hands 12/25/2016   Primary osteoarthritis of both feet 12/25/2016   Osteoporosis 12/25/2016  Sedalia Muta, PT, DPT 12:00 PM  07/01/21   [facility] [address] [place], [place], [address] Phone: [phone]   Fax:  [phone]  Name: MAHDIYA MOSSBERG MRN: [id number] Date of Birth: November 16, 1941

## 2021-07-02 ENCOUNTER — Other Ambulatory Visit: Payer: Federal, State, Local not specified - PPO

## 2021-07-03 NOTE — Assessment & Plan Note (Signed)
DOE > 6 months ? Etiology  Workup has been unrevealing . Describes as mild only with incline Joesphine Bare. 2 D echo , Chest xray normal . No desaturations on room air walking  PFT normal .  Will try albuterol inhaler ? Exercise induced asthma/RAD  FH +CAD, and dyspnea w/ exertion -refer to cardiology   Plan  Patient Instructions  Albuterol inhaler 1-2 puffs every 4-6 hrs as needed for shortness of breath /wheezing .  Activity as tolerated.  Refer to cardiology -shortness of breath with activity .  Follow up with Dr. Vassie Loll  or Lashun Ramseyer NP in 3 months and As needed

## 2021-07-07 ENCOUNTER — Inpatient Hospital Stay: Admission: RE | Admit: 2021-07-07 | Payer: Federal, State, Local not specified - PPO | Source: Ambulatory Visit

## 2021-07-07 ENCOUNTER — Encounter: Payer: Federal, State, Local not specified - PPO | Admitting: Physical Therapy

## 2021-07-08 ENCOUNTER — Telehealth: Payer: Self-pay

## 2021-07-08 ENCOUNTER — Encounter: Payer: Self-pay | Admitting: Family Medicine

## 2021-07-08 NOTE — Telephone Encounter (Signed)
LVM to inform that Dr Artis Flock has left and to call the office to get scheduled with new provider

## 2021-07-12 ENCOUNTER — Encounter: Payer: Self-pay | Admitting: Physical Therapy

## 2021-07-12 ENCOUNTER — Ambulatory Visit: Payer: Federal, State, Local not specified - PPO | Admitting: Physical Therapy

## 2021-07-12 ENCOUNTER — Other Ambulatory Visit: Payer: Self-pay

## 2021-07-12 DIAGNOSIS — M542 Cervicalgia: Secondary | ICD-10-CM | POA: Diagnosis not present

## 2021-07-12 DIAGNOSIS — G8929 Other chronic pain: Secondary | ICD-10-CM | POA: Diagnosis not present

## 2021-07-12 DIAGNOSIS — M25511 Pain in right shoulder: Secondary | ICD-10-CM | POA: Diagnosis not present

## 2021-07-12 NOTE — Therapy (Addendum)
[facility] [address] [place], [place], [address] Phone: [phone]   Fax:  [phone]  Physical Therapy Treatment  Patient Details  Name: Christine Scott MRN: [id number] Date of Birth: 01-16-1941 Referring Provider (PT): Lynne Leader   Encounter Date: 07/12/2021   PT End of Session - 07/12/21 2143     Visit Number 3    Number of Visits 12    Date for PT Re-Evaluation 08/04/21    Authorization Type BCBS    PT Start Time 1105    PT Stop Time 1155    PT Time Calculation (min) 50 min    Activity Tolerance Patient tolerated treatment well    Behavior During Therapy [facility] for tasks assessed/performed             Past Medical History:  Diagnosis Date   Abnormal Pap smear of cervix    --hx abnormal paps in her 20s and per patient this was reason for her Hysterectomy   Anxiety    Depression    Dry eye    Hypertension    Osteoarthritis    hands   Osteoporosis    Polymyalgia rheumatica ([place])     Past Surgical History:  Procedure Laterality Date   ABDOMINAL HYSTERECTOMY     BREAST SURGERY  2016   benign mass removed in [place], [place] ARTHROPLASTY Left 04/16/2020   Procedure: REVERSE SHOULDER ARTHROPLASTY;  Surgeon: Justice Britain, MD;  Location: [facility];  Service: Orthopedics;  Laterality: Left;  150mn   TOTAL VAGINAL HYSTERECTOMY  age 80  --due to abnormal pap smears per patient    There were no vitals filed for this visit.   Subjective Assessment - 07/12/21 2142     Subjective Pt states mild soreness in R shoulder wtih certain motions. L neck still very sore.    Currently in Pain? Yes    Pain Score 2     Pain Location Shoulder    Pain Orientation Right    Pain Descriptors / Indicators Aching    Pain Type Chronic pain    Pain Onset More than a month ago    Pain Frequency Intermittent    Pain Score 7    Pain Location Neck    Pain Orientation Left    Pain Descriptors  / Indicators Aching    Pain Type Chronic pain    Pain Onset More than a month ago    Pain Frequency Intermittent                               [facility] Adult PT Treatment/Exercise - 07/12/21 0001       Shoulder Exercises: Supine   Flexion 15 reps;AROM      Shoulder Exercises: Standing   Retraction 20 reps    Other Standing Exercises Bil ER YTB x 15:      Shoulder Exercises: Pulleys   Flexion 2 minutes      Modalities   Modalities Moist Heat      Moist Heat Therapy   Number Minutes Moist Heat 10 Minutes    Moist Heat Location Cervical      Manual Therapy   Manual therapy comments skilled palpation and monitoring of soft tissue with dry needling.    Soft tissue mobilization STM/TPR to L UT, levator,    Passive ROM manual stretching for UTs ,  Manual Traction Cervcial, light, 10 sec x 10 ;              Trigger Point Dry Needling - 07/12/21 0001     Consent Given? Yes    Education Handout Provided Yes    Muscles Treated Head and Neck Upper trapezius    Upper Trapezius Response Twitch reponse elicited;Palpable increased muscle length   L                   PT Short Term Goals - 06/23/21 1424       PT SHORT TERM GOAL #1   Title Pt to be independent with intial HEP    Time 2    Period Weeks    Status New    Target Date 07/07/21               PT Long Term Goals - 06/23/21 1425       PT LONG TERM GOAL #1   Title Pt to be independent with final HEP    Time 6    Period Weeks    Status New    Target Date 08/04/21      PT LONG TERM GOAL #2   Title Pt to report decreased pain in R shoulder and neck to 0-2/10 with activity, reaching, and IADLs.    Time 6    Period Weeks    Status New    Target Date 08/04/21      PT LONG TERM GOAL #3   Title Pt to demo increased strength of R shoulder, to be at least 4+/5 to improve ability for lifting, reaching, carrying and IADLS.    Time 6    Period Weeks    Status New    Target  Date 08/04/21                   Plan - 07/12/21 2147     Clinical Impression Statement Pt with hypertonicity and pain in L UT region. Manual and dry needling done today to improve, pt with good tolerance, will assess effects next visit. Pt with decreased muscle tension and improved pain with ROM after treatment today. Pt to benefit from shoulder girdle strengthening bil, likley getting increased compensation on L from previous L TSA and decreased shoulder mechancs.    Examination-Activity Limitations Sleep;Lift;Reach Overhead;Carry;Dressing    Examination-Participation Restrictions Cleaning;Community Activity;Shop;Driving;Laundry;Meal Prep    Stability/Clinical Decision Making Stable/Uncomplicated    Rehab Potential Good    PT Frequency 2x / week    PT Duration 6 weeks    PT Treatment/Interventions ADLs/Self Care Home Management;Cryotherapy;Iontophoresis 80m/ml Dexamethasone;Therapeutic exercise;Therapeutic activities;Electrical Stimulation;Functional mobility training;Stair training;Gait training;DME Instruction;Ultrasound;Neuromuscular re-education;Cognitive remediation;Patient/family education;Orthotic Fit/Training;Manual techniques;Taping;Vasopneumatic Device;Dry needling;Passive range of motion;Spinal Manipulations;Joint Manipulations    PT Home Exercise Plan [email or web address]    Consulted and Agree with Plan of Care Patient             Patient will benefit from skilled therapeutic intervention in order to improve the following deficits and impairments:  Hypomobility, Decreased activity tolerance, Decreased strength, Pain, Increased muscle spasms, Decreased mobility, Decreased range of motion, Improper body mechanics, Impaired flexibility  Visit Diagnosis: Chronic right shoulder pain  Cervicalgia     Problem List 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   Greater  trochanteric bursitis of left hip 07/04/2019   DDD (degenerative disc disease), lumbar 02/25/2019   Bipolar II  disorder ([place]) 12/07/2018   Left rotator cuff tear arthropathy 12/06/2018   History of depression 08/12/2017   Essential hypertension 08/12/2017   Lumbar radiculopathy 06/15/2017   Sjogren's syndrome 12/25/2016   Polymyalgia rheumatica ([place]) 12/25/2016   Primary osteoarthritis of both hands 12/25/2016   Primary osteoarthritis of both feet 12/25/2016   Osteoporosis 12/25/2016   Lyndee Hensen, PT, DPT 9:51 PM  07/12/21    [facility] [place], [place], [address] Phone: [phone]   Fax:  [phone]  Name: Christine Scott MRN: [id number] Date of Birth: September 26, 1941  PHYSICAL THERAPY DISCHARGE SUMMARY  Visits from Start of Care: 3  Plan: Patient agrees to discharge.  Patient goals were partially met. Patient is being discharged due to - not returning since last visit.     Lyndee Hensen, PT, DPT 2:07 PM  01/24/22

## 2021-07-15 ENCOUNTER — Encounter: Payer: Federal, State, Local not specified - PPO | Admitting: Physical Therapy

## 2021-07-16 NOTE — Progress Notes (Deleted)
Office Visit Note  Patient: Christine Scott             Date of Birth: [birth year]           MRN: [id number]             PCP: Orland Mustard, MD Referring: Orland Mustard, MD Visit Date: 07/29/2021 Occupation: @GUAROCC @  Subjective:  No chief complaint on file.   History of Present Illness: Christine Scott is a 80 y.o. female ***   Activities of Daily Living:  Patient reports morning stiffness for *** {minute/hour:19697}.   Patient {ACTIONS;DENIES/REPORTS:21021675::"Denies"} nocturnal pain.  Difficulty dressing/grooming: {ACTIONS;DENIES/REPORTS:21021675::"Denies"} Difficulty climbing stairs: {ACTIONS;DENIES/REPORTS:21021675::"Denies"} Difficulty getting out of chair: {ACTIONS;DENIES/REPORTS:21021675::"Denies"} Difficulty using hands for taps, buttons, cutlery, and/or writing: {ACTIONS;DENIES/REPORTS:21021675::"Denies"}  No Rheumatology ROS completed.   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   Greater trochanteric bursitis of left hip 07/04/2019   DDD (degenerative disc disease), lumbar 02/25/2019   Bipolar II disorder (HCC) 12/07/2018   Left rotator cuff tear arthropathy 12/06/2018   History of depression 08/12/2017   Essential hypertension 08/12/2017   Lumbar radiculopathy 06/15/2017   Sjogren's syndrome 12/25/2016   Polymyalgia rheumatica (HCC) 12/25/2016   Primary osteoarthritis of both hands 12/25/2016   Primary osteoarthritis of both feet 12/25/2016   Osteoporosis 12/25/2016    Past Medical History:  Diagnosis Date   Abnormal Pap smear of cervix    --hx abnormal paps in her 30s and per patient this was reason for her Hysterectomy   Anxiety    Depression    Dry eye    Hypertension    Osteoarthritis    hands   Osteoporosis    Polymyalgia rheumatica (HCC)     Family History  Problem Relation Age of Onset   Congestive Heart Failure Mother     Stroke Mother    Diabetes Father    Colon cancer Brother    Diabetes Brother    Diabetes Brother    Stroke Brother    Colon cancer Daughter    Past Surgical History:  Procedure Laterality Date   ABDOMINAL HYSTERECTOMY     BREAST SURGERY  2016   benign mass removed in [place].   FACIAL COSMETIC SURGERY     REVERSE SHOULDER ARTHROPLASTY Left 04/16/2020   Procedure: REVERSE SHOULDER ARTHROPLASTY;  Surgeon: 04/18/2020, MD;  Location: [facility];  Service: Orthopedics;  Laterality: Left;  Christine Scott   TOTAL VAGINAL HYSTERECTOMY  age 4   --due to abnormal pap smears per patient   Social History   Social History Narrative   Right handed   Lives with family    Immunization History  Administered Date(s) Administered   Fluad Quad(high Dose 65+) 08/28/2019, 11/24/2020   Influenza, High Dose Seasonal PF 10/21/2017, 11/27/2018   Influenza-Unspecified 10/13/2016   PFIZER Comirnaty(Gray Top)Covid-19 Tri-Sucrose Vaccine 05/31/2021   PFIZER(Purple Top)SARS-COV-2 Vaccination 01/12/2020, 02/05/2020, 11/07/2020   Pneumococcal Conjugate-13 08/17/2018   Pneumococcal Polysaccharide-23 01/19/2016   Tdap 12/26/2009     Objective: Vital Signs: LMP  (LMP Unknown)    Physical Exam   Musculoskeletal Exam: ***  CDAI Exam: CDAI Score: -- Patient Global: --; Provider Global: -- Swollen: --; Tender: -- Joint Exam 07/29/2021   No joint exam has been documented for this visit   There is currently no information documented on the homunculus. Go to the Rheumatology activity and complete the homunculus joint exam.  Investigation: No additional findings.  Imaging: No results found.  Recent Labs: Lab Results  Component Value Date   WBC 5.8 04/15/2021   HGB 11.7 (L) 04/15/2021   PLT 213.0 04/15/2021   NA 136 04/15/2021   K 4.2 04/15/2021   CL 101 04/15/2021   CO2 26 04/15/2021   GLUCOSE 95 04/15/2021   BUN 15 04/15/2021   CREATININE 0.75 04/15/2021   BILITOT 0.4 04/15/2021    ALKPHOS 58 04/15/2021   AST 20 04/15/2021   ALT 11 04/15/2021   PROT 7.3 04/15/2021   ALBUMIN 4.1 04/15/2021   CALCIUM 9.2 04/15/2021   GFRAA >60 04/13/2020    Speciality Comments: Osteoporosis managed by PCP.  Procedures:  No procedures performed Allergies: Patient has no known allergies.   Assessment / Plan:     Visit Diagnoses: No diagnosis found.  Orders: No orders of the defined types were placed in this encounter.  No orders of the defined types were placed in this encounter.   Face-to-face time spent with patient was *** minutes. Greater than 50% of time was spent in counseling and coordination of care.  Follow-Up Instructions: No follow-ups on file.   Ellen Henri, CMA  Note - This record has been created using Animal nutritionist.  Chart creation errors have been sought, but may not always  have been located. Such creation errors do not reflect on  the standard of medical care.

## 2021-07-19 ENCOUNTER — Encounter: Payer: Federal, State, Local not specified - PPO | Admitting: Physical Therapy

## 2021-07-21 ENCOUNTER — Encounter: Payer: Federal, State, Local not specified - PPO | Admitting: Physical Therapy

## 2021-07-21 ENCOUNTER — Encounter: Payer: Federal, State, Local not specified - PPO | Admitting: Counselor

## 2021-07-28 ENCOUNTER — Telehealth: Payer: Self-pay

## 2021-07-28 NOTE — Telephone Encounter (Signed)
Nurse Assessment Nurse: Henri Medal, RN, Amy Date/Time Lamount Cohen Time): 07/28/2021 1:19:19 PM Confirm and document reason for call. If symptomatic, describe symptoms. ---Caller states she slipped, fell backwards, and hit the back of her head. It is sore, & there is a raised knot. It is going down some. It is about the size of a gold ball. She did not lose consciousness. She is wondering if she needs to have it X-rayed. Does the patient have any new or worsening symptoms? ---Yes Will a triage be completed? ---Yes Related visit to physician within the last 2 weeks? ---No Does the PT have any chronic conditions? (i.e. diabetes, asthma, this includes High risk factors for pregnancy, etc.) ---Yes List chronic conditions. ---polymyalgia rheumatica, HTN, depression Is this a behavioral health or substance abuse call? ---No Guidelines Guideline Title Affirmed Question Affirmed Notes Nurse Date/Time (Eastern Time) Head Injury [1] Age over 64 years AND [2] swelling or bruise Lovelace, RN, Amy 07/28/2021 1:21:59 PM Disp. Time Lamount Cohen Time) Disposition Final User PLEASE NOTE: All timestamps contained within this report are represented as [place] Standard Time. CONFIDENTIALTY NOTICE: This fax transmission is intended only for the addressee. It contains information that is legally privileged, confidential or otherwise protected from use or disclosure. If you are not the intended recipient, you are strictly prohibited from reviewing, disclosing, copying using or disseminating any of this information or taking any action in reliance on or regarding this information. If you have received this fax in error, please notify us immediately by telephone so that we can arrange for its return to [place]. Phone: [phone], Toll-Free: [phone], Fax: [phone] Page: 2 of 2 Call Id: [id number] 07/28/2021 1:24:32 PM See HCP within 4 Hours (or PCP triage) Yes Lovelace, RN, Amy Caller Disagree/Comply  Disagree Caller Understands Yes PreDisposition InappropriateToAsk Care Advice Given Per Guideline SEE HCP (OR PCP TRIAGE) WITHIN 4 HOURS: * IF OFFICE WILL BE OPEN: You need to be seen within the next 3 or 4 hours. Call your doctor (or NP/PA) now or as soon as the office opens. CALL BACK IF: * You become worse CARE ADVICE given per Head Injury (Adult) guideline. Comments User: Lutricia Horsfall, RN Date/Time Lamount Cohen Time): 07/28/2021 1:30:07 PM Called the back line. They said there are no available appts. in the next 24 hours. Advised caller to go to UC. She said she did not have faith in urgent cares. She did not want to go to ER. She said she would go if she got worse. Referrals GO TO FACILITY REFUSED

## 2021-07-28 NOTE — Telephone Encounter (Signed)
Spoke to pt told her calling regard Triage note and her hitting her head and you did not want to go to the ED or Urgent care. Told pt sorry no available appointments but I do recommend you go to Urgent Care. Pt said she wants and x-ray and was told they do not do them by the nurse. Told her she can go to a [facility] Urgent care and they do x-rays but you may need a CT scan of your head and they can not do them there, would send you over to the Hospital ED. Told her she can go to [facility] ED and then have CT scan done there if needed and may not have to wait as long, but I really want you to be evaluated. Pt verbalized understanding and said she will go.

## 2021-07-29 ENCOUNTER — Ambulatory Visit: Payer: Federal, State, Local not specified - PPO | Admitting: Physician Assistant

## 2021-07-29 ENCOUNTER — Encounter: Payer: Federal, State, Local not specified - PPO | Admitting: Counselor

## 2021-07-29 DIAGNOSIS — M19072 Primary osteoarthritis, left ankle and foot: Secondary | ICD-10-CM

## 2021-07-29 DIAGNOSIS — R0602 Shortness of breath: Secondary | ICD-10-CM

## 2021-07-29 DIAGNOSIS — M5030 Other cervical disc degeneration, unspecified cervical region: Secondary | ICD-10-CM

## 2021-07-29 DIAGNOSIS — M353 Polymyalgia rheumatica: Secondary | ICD-10-CM

## 2021-07-29 DIAGNOSIS — Z96612 Presence of left artificial shoulder joint: Secondary | ICD-10-CM

## 2021-07-29 DIAGNOSIS — M810 Age-related osteoporosis without current pathological fracture: Secondary | ICD-10-CM

## 2021-07-29 DIAGNOSIS — R413 Other amnesia: Secondary | ICD-10-CM

## 2021-07-29 DIAGNOSIS — Z8659 Personal history of other mental and behavioral disorders: Secondary | ICD-10-CM

## 2021-07-29 DIAGNOSIS — M19041 Primary osteoarthritis, right hand: Secondary | ICD-10-CM

## 2021-07-29 DIAGNOSIS — Z8679 Personal history of other diseases of the circulatory system: Secondary | ICD-10-CM

## 2021-07-29 DIAGNOSIS — M3501 Sicca syndrome with keratoconjunctivitis: Secondary | ICD-10-CM

## 2021-07-29 DIAGNOSIS — M5136 Other intervertebral disc degeneration, lumbar region: Secondary | ICD-10-CM

## 2021-07-30 NOTE — Progress Notes (Deleted)
80 y.o. G18P2004 Widowed Caucasian female here for annual exam.    PCP:  Orland Mustard, MD   No LMP recorded (lmp unknown). Patient has had a hysterectomy.           Sexually active: {yes no:314532}  The current method of family planning is status post hysterectomy.    Exercising: {yes no:314532}  {types:19826} Smoker:  no  Health Maintenance: Pap:  04-27-18 Neg, 04-13-16 Neg, 2002 Neg History of abnormal Pap:  {YES NO:22349} MMG:  ***07-15-20 3D/Neg/BiRads1 Colonoscopy:  ***Virtual colonoscopy scheduled 08-05-21.  2018 normal per patient. BMD:  11-27-19  Result :***Osteopenia multiple sites--Prolia with Rheumatology--Hx Osteoporosis TDaP:  PCP Gardasil:   no HIV:04-27-18 NR Hep C:04-27-18 Neg Screening Labs:  Hb today: ***, Urine today: ***   reports that she has never smoked. She has never used smokeless tobacco. She reports current alcohol use of about 2.0 standard drinks of alcohol per week. She reports that she does not use drugs.  Past Medical History:  Diagnosis Date   Abnormal Pap smear of cervix    --hx abnormal paps in her 30s and per patient this was reason for her Hysterectomy   Anxiety    Depression    Dry eye    Hypertension    Osteoarthritis    hands   Osteoporosis    Polymyalgia rheumatica (HCC)     Past Surgical History:  Procedure Laterality Date   ABDOMINAL HYSTERECTOMY     BREAST SURGERY  2016   benign mass removed in [place], [place].   FACIAL COSMETIC SURGERY     REVERSE SHOULDER ARTHROPLASTY Left 04/16/2020   Procedure: REVERSE SHOULDER ARTHROPLASTY;  Surgeon: Francena Hanly, MD;  Location: [facility];  Service: Orthopedics;  Laterality: Left;    TOTAL VAGINAL HYSTERECTOMY  age 42   --due to abnormal pap smears per patient    Current Outpatient Medications  Medication Sig Dispense Refill   acetaminophen (TYLENOL) 500 MG tablet Take 1,000 mg by mouth at bedtime.     albuterol (VENTOLIN HFA) 108 (90 Base) MCG/ACT inhaler Inhale 1-2 puffs into the lungs  every 6 (six) hours as needed for wheezing or shortness of breath. 8 g 2   amLODipine (NORVASC) 10 MG tablet Take 1 tablet (10 mg total) by mouth daily. 90 tablet 3   B Complex-Biotin-FA (B-COMPLEX PO) Take 1 tablet by mouth daily. Super     Calcium Carb-Cholecalciferol (CALCIUM CARBONATE-VITAMIN D3) 600-400 MG-UNIT TABS Take 1 tablet by mouth daily.      Cholecalciferol (VITAMIN D3) 50 MCG (2000 UT) TABS Take 2,000 mg by mouth daily.     conjugated estrogens (PREMARIN) vaginal cream Place 1/2 gram per vagina at bedtime twice weekly. 30 g 1   COVID-19 mRNA Vac-TriS, Pfizer, (PFIZER-BIONT COVID-19 VAC-TRIS) SUSP injection Inject into the muscle. 0.3 mL 0   lamoTRIgine (LAMICTAL) 100 MG tablet TAKE ONE TABLET BY MOUTH DAILY 90 tablet 1   Multiple Vitamins-Minerals (MULTIVITAMIN PO) Take 1 tablet by mouth daily. Centrum silver     Omega-3 Fatty Acids (FISH OIL) 1000 MG CAPS Take 1,000 mg by mouth daily.      Polyethyl Glycol-Propyl Glycol (SYSTANE ULTRA OP) Place 1 drop into both eyes in the morning and at bedtime.      White Petrolatum-Mineral Oil (SYSTANE NIGHTTIME) OINT Place 1 application into both eyes at bedtime as needed (Dry eye).      No current facility-administered medications for this visit.    Family History  Problem Relation Age of  Onset   Congestive Heart Failure Mother    Stroke Mother    Diabetes Father    Colon cancer Brother    Diabetes Brother    Diabetes Brother    Stroke Brother    Colon cancer Daughter     Review of Systems  Exam:   LMP  (LMP Unknown)     General appearance: alert, cooperative and appears stated age Head: normocephalic, without obvious abnormality, atraumatic Neck: no adenopathy, supple, symmetrical, trachea midline and thyroid normal to inspection and palpation Lungs: clear to auscultation bilaterally Breasts: normal appearance, no masses or tenderness, No nipple retraction or dimpling, No nipple discharge or bleeding, No axillary  adenopathy Heart: regular rate and rhythm Abdomen: soft, non-tender; no masses, no organomegaly Extremities: extremities normal, atraumatic, no cyanosis or edema Skin: skin color, texture, turgor normal. No rashes or lesions Lymph nodes: cervical, supraclavicular, and axillary nodes normal. Neurologic: grossly normal  Pelvic: External genitalia:  no lesions              No abnormal inguinal nodes palpated.              Urethra:  normal appearing urethra with no masses, tenderness or lesions              Bartholins and Skenes: normal                 Vagina: normal appearing vagina with normal color and discharge, no lesions              Cervix: no lesions              Pap taken: {yes no:314532} Bimanual Exam:  Uterus:  normal size, contour, position, consistency, mobility, non-tender              Adnexa: no mass, fullness, tenderness              Rectal exam: {yes no:314532}.  Confirms.              Anus:  normal sphincter tone, no lesions  Chaperone was present for exam:  ***  Assessment:   Well woman visit with gynecologic exam.   Plan: Mammogram screening discussed. Self breast awareness reviewed. Pap and HR HPV as above. Guidelines for Calcium, Vitamin D, regular exercise program including cardiovascular and weight bearing exercise.   Follow up annually and prn.   Additional counseling given.  {yes [id number]. _______ minutes face to face time of which over 50% was spent in counseling.    After visit summary provided.

## 2021-07-30 NOTE — Telephone Encounter (Signed)
Please schedule TOC appointment with pt.

## 2021-07-30 NOTE — Telephone Encounter (Signed)
Pt also needs to establish care with another PCP.

## 2021-08-02 ENCOUNTER — Ambulatory Visit: Payer: Federal, State, Local not specified - PPO | Admitting: Obstetrics and Gynecology

## 2021-08-02 NOTE — Telephone Encounter (Signed)
Patient is scheduled with Alyssa.  

## 2021-08-05 ENCOUNTER — Ambulatory Visit
Admission: RE | Admit: 2021-08-05 | Discharge: 2021-08-05 | Disposition: A | Discharge status: Home / Self Care | Payer: Federal, State, Local not specified - PPO | Source: Ambulatory Visit | Attending: Gastroenterology | Admitting: Gastroenterology

## 2021-08-05 ENCOUNTER — Other Ambulatory Visit: Payer: Self-pay

## 2021-08-05 DIAGNOSIS — Z86010 Personal history of colonic polyps: Secondary | ICD-10-CM

## 2021-08-05 DIAGNOSIS — Z800 Family history of malignant neoplasm of digestive organs: Secondary | ICD-10-CM

## 2021-08-09 ENCOUNTER — Ambulatory Visit: Payer: Federal, State, Local not specified - PPO | Admitting: Physician Assistant

## 2021-08-13 ENCOUNTER — Telehealth: Payer: Self-pay | Admitting: Psychiatry

## 2021-08-13 NOTE — Telephone Encounter (Signed)
I have not seen her since July 2020 at which point she wanted to go off the lamotrigine.  She is scheduled to see me fairly soon but she needs to get her current doctor to increase the lamotrigine for her.  I cannot do that until I see her.  I

## 2021-08-13 NOTE — Telephone Encounter (Signed)
Pt called and said that her doctor is retiring that prescribed her lamictal.  She would like dr. Jennelle Human to prescribe it now but she would lie to increase it from 100 mg to 200 mg . [facility]  on [address] [employer]

## 2021-08-13 NOTE — Telephone Encounter (Signed)
Please review

## 2021-08-13 NOTE — Telephone Encounter (Signed)
Pt informed

## 2021-08-19 ENCOUNTER — Encounter: Payer: Federal, State, Local not specified - PPO | Admitting: Counselor

## 2021-08-25 ENCOUNTER — Ambulatory Visit: Payer: Federal, State, Local not specified - PPO | Admitting: Physician Assistant

## 2021-08-26 ENCOUNTER — Ambulatory Visit: Payer: Federal, State, Local not specified - PPO | Admitting: Physician Assistant

## 2021-09-01 ENCOUNTER — Ambulatory Visit: Payer: Federal, State, Local not specified - PPO | Admitting: Psychiatry

## 2021-09-01 ENCOUNTER — Encounter: Payer: Self-pay | Admitting: Psychiatry

## 2021-09-01 ENCOUNTER — Other Ambulatory Visit: Payer: Self-pay

## 2021-09-01 DIAGNOSIS — F4323 Adjustment disorder with mixed anxiety and depressed mood: Secondary | ICD-10-CM | POA: Diagnosis not present

## 2021-09-01 DIAGNOSIS — F3181 Bipolar II disorder: Secondary | ICD-10-CM
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
## 2021-09-01 NOTE — Progress Notes (Signed)
Christine DyFrances G Patnaude [id number] 06/16/1941 80 y.o.  Subjective:   Patient ID:  Christine Scott is a 80 y.o. (DOB 03/25/1941) female.  Chief Complaint:  Chief Complaint  Patient presents with   Follow-up   Depression    Depression       Marina GravelFrances G Hoyt presents to the office today fas a return patient or follow-up of bipolar 2 depression and history of alcohol excess.    appt April 04, 2018.  Was anxious and depressed and was to start Latuda.  She didn't bc she decided her main priority is stopping psych meds.    05/29/2019 appt noted: Started sleep meds bc of husband's illness and death.  Now wants to try to stop meds.  Has not tried reducing the lorazepam.  Asks about hydroxyzine.   Pt reports that mood is Depressed and but not as bad as it was  and describes anxiety as Minimal. Anxiety symptoms include: Excessive Worry,. Pt reports no sleep issues. Pt reports that appetite is good. Pt reports that energy is good and good. Concentration is good. Suicidal thoughts:  denied by patient. She was supposed to follow-up in December but has not.  At her last appointment because of depressive symptoms we were going to increase Subvenite 100 mg daily to 150 mg. Don't feel like it's really helping me.  Wonders about counseling bc still dealing with grief  And guilt from the past and still ruminating. [facility]  Also had Hospice counseling.  Still feels depressed.  Not highly anxious.  Sleeping okay eating okay. She wanted to taper off of lamotrigine and lorazepam and was given instruction as to how to do so.  09/01/2021 return patient not seen in over 2 years: She has been given lamotrigine again by another doctor but wanted to return here for evaluation.  Was getting lamotrigine from PCP Dr. Sheppard PentonWolf who is retiring.  Went in to see the doctor bc she was more depressed.  Was off of lamotrigine for a couple of years.  Hasn't seen another psychiatrist since here.  Back on lamotrigine  for several months. It did help depression when she resumed it.  No SE.  Sleeps well.  Off and on has had a tendency to drink too much wine esp when stressed.  At home can't have just one glass but will drink a whole bottle.  Last time about a month ago. Has been to [employer] [facility] [facility] meetings and has had some counseling around dealing with them.  Recently called and wanted to increase the dose.  Stress GS incarcerated and she dwells on it and he's going to prison for quite a while.  Stressed now more than depressed.  One of daughters can't support herself and pt sends her money which she doesn't have enough of.  D's family in [place] is the source of her stress. Would like to try higher dose of lamotrigine.  Past Psychiatric Medication Trials: Latuda 40 helped but nervous, lamotrigine,   lithium, Abilify,  lorazepam,  venlafaxine, sertraline, fluoxetine, Pristiq,  Wellbutrin 150 no response  D with psych meds and taking lamotrigine and some other med.  Also perhaps auditory processing problem and pt wonders about whether she has it.  Review of Systems:  Review of Systems  Cardiovascular:  Negative for palpitations.  Neurological:  Negative for tremors and weakness.  Psychiatric/Behavioral:  The patient is nervous/anxious.    Medications: I have reviewed the patient's current medications.  Current Outpatient Medications  Medication Sig Dispense Refill   acetaminophen (TYLENOL) 500 MG tablet Take 1,000 mg by mouth at bedtime.     amLODipine (NORVASC) 10 MG tablet Take 1 tablet (10 mg total) by mouth daily. 90 tablet 3   B Complex-Biotin-FA (B-COMPLEX PO) Take 1 tablet by mouth daily. Super     Calcium Carb-Cholecalciferol (CALCIUM CARBONATE-VITAMIN D3) 600-400 MG-UNIT TABS Take 1 tablet by mouth daily.      Cholecalciferol (VITAMIN D3) 50 MCG (2000 UT) TABS Take 2,000 mg by mouth daily.     conjugated estrogens (PREMARIN) vaginal cream Place 1/2 gram per vagina at bedtime twice weekly. 30 g 1    COVID-19 mRNA Vac-TriS, Pfizer, (PFIZER-BIONT COVID-19 VAC-TRIS) SUSP injection Inject into the muscle. 0.3 mL 0   lamoTRIgine (LAMICTAL) 100 MG tablet TAKE ONE TABLET BY MOUTH DAILY 90 tablet 1   Multiple Vitamins-Minerals (MULTIVITAMIN PO) Take 1 tablet by mouth daily. Centrum silver     Omega-3 Fatty Acids (FISH OIL) 1000 MG CAPS Take 1,000 mg by mouth daily.      Polyethyl Glycol-Propyl Glycol (SYSTANE ULTRA OP) Place 1 drop into both eyes in the morning and at bedtime.      White Petrolatum-Mineral Oil (SYSTANE NIGHTTIME) OINT Place 1 application into both eyes at bedtime as needed (Dry eye).      albuterol (VENTOLIN HFA) 108 (90 Base) MCG/ACT inhaler Inhale 1-2 puffs into the lungs every 6 (six) hours as needed for wheezing or shortness of breath. 8 g 2   No current facility-administered medications for this visit.    Medication Side Effects: None  Allergies: No Known Allergies  Past Medical History:  Diagnosis Date   Abnormal Pap smear of cervix    --hx abnormal paps in her 30s and per patient this was reason for her Hysterectomy   Anxiety    Depression    Dry eye    Hypertension    Osteoarthritis    hands   Osteoporosis    Polymyalgia rheumatica (HCC)     Family History  Problem Relation Age of Onset   Congestive Heart Failure Mother    Stroke Mother    Diabetes Father    Colon cancer Brother    Diabetes Brother    Diabetes Brother    Stroke Brother    Colon cancer Daughter     Social History   Socioeconomic History   Marital status: Widowed    Spouse name: Not on file   Number of children: Not on file   Years of education: Not on file   Highest education level: Not on file  Occupational History   Not on file  Tobacco Use   Smoking status: Never   Smokeless tobacco: Never  Vaping Use   Vaping Use: Never used  Substance and Sexual Activity   Alcohol use: Yes    Alcohol/week: 2.0 standard drinks    Types: 2 Glasses of wine per week    Comment:  sometimes a bottle of wine a week    Drug use: Never   Sexual activity: Not Currently    Partners: Male    Birth control/protection: Surgical    Comment: TVH  Other Topics Concern   Not on file  Social History Narrative   Right handed   Lives with family    Social Determinants of Health   Financial Resource Strain: Not on file  Food Insecurity: Not on file  Transportation Needs: Not on file  Physical Activity: Not on file  Stress: Not  on file  Social Connections: Not on file  Intimate Partner Violence: Not on file    Past Medical History, Surgical history, Social history, and Family history were reviewed and updated as appropriate.   Please see review of systems for further details on the patient's review from today.   Objective:   Physical Exam:  LMP  (LMP Unknown)   Physical Exam Neurological:     Mental Status: She is alert and oriented to person, place, and time.     Cranial Nerves: No dysarthria.  Psychiatric:        Attention and Perception: Attention normal. She does not perceive auditory hallucinations.        Mood and Affect: Mood is anxious. Mood is not depressed.        Speech: Speech normal.        Behavior: Behavior is cooperative.        Thought Content: Thought content normal. Thought content is not paranoid or delusional. Thought content does not include homicidal or suicidal ideation. Thought content does not include homicidal or suicidal plan.        Cognition and Memory: Cognition and memory normal.        Judgment: Judgment normal.     Comments: Insight fair  Afraid of dementia. Rigidity complicates treatment.    Lab Review:     Component Value Date/Time   NA 136 04/15/2021 1022   K 4.2 04/15/2021 1022   CL 101 04/15/2021 1022   CO2 26 04/15/2021 1022   GLUCOSE 95 04/15/2021 1022   BUN 15 04/15/2021 1022   CREATININE 0.75 04/15/2021 1022   CREATININE 0.99 (H) 02/25/2019 1214   CALCIUM 9.2 04/15/2021 1022   PROT 7.3 04/15/2021 1022    ALBUMIN 4.1 04/15/2021 1022   AST 20 04/15/2021 1022   ALT 11 04/15/2021 1022   ALKPHOS 58 04/15/2021 1022   BILITOT 0.4 04/15/2021 1022   GFRNONAA >60 04/13/2020 0807   GFRNONAA 55 (L) 02/25/2019 1214   GFRAA >60 04/13/2020 0807   GFRAA 63 02/25/2019 1214       Component Value Date/Time   WBC 5.8 04/15/2021 1022   RBC 3.95 04/15/2021 1022   HGB 11.7 (L) 04/15/2021 1022   HCT 36.0 04/15/2021 1022   PLT 213.0 04/15/2021 1022   MCV 91.2 04/15/2021 1022   MCH 30.5 04/13/2020 0807   MCHC 32.6 04/15/2021 1022   RDW 14.3 04/15/2021 1022   LYMPHSABS 0.7 04/15/2021 1022   MONOABS 0.3 04/15/2021 1022   EOSABS 0.0 04/15/2021 1022   BASOSABS 0.0 04/15/2021 1022    No results found for: POCLITH, LITHIUM   No results found for: PHENYTOIN, PHENOBARB, VALPROATE, CBMZ   .res Assessment: Plan:    Bipolar II disorder (HCC)  Adjustment disorder with mixed anxiety and depressed mood    Greater than 50% of face to face time with patient was spent on counseling and coordination of care. We discussed the following:  Ms. Arango is chronically ambivalent about taking medications and has poor insight into having bipolar disorder though this is been explained to her multiple times.  In fact we have had this discussion since her very first visit in 2010.  Her daughter also felt that she was bipolar.    She does not accept the bipolar diagnosis.  We have had extensive discussions of the subject on multiple appointments. Rec against stopping meds lamotrigine bc got worse before which happened and she restarted after being off for a couple of years  lately.  Increase lamotrigine from 100 mg to 150 mg daily in the AM.  Option buspirone for anxiety trial but defer to minimize meds and bc history of noncompliance.  Would like to avoid sedatives.    Answered her questions about meds.    Discussed alcohol use.  Not abuse at this time.  Answered questions about My Chart and history of another doctor  writing about her alcohol use.  This appt was 30 mins.  FU 3 mos  Meredith Staggers, MD, DFAPA    Please see After Visit Summary for patient specific instructions.  Future Appointments  Date Time Provider Department Center  09/02/2021 11:00 AM Marcos Eke, PA-C [facility] None  09/08/2021  9:40 AM Jodelle Red, MD [facility]  09/14/2021  2:30 PM Patton Salles, MD [facility] None  09/29/2021 10:00 AM [facility] NURSE [facility]  09/30/2021 10:30 AM Parrett, Virgel Bouquet, NP [facility] None  10/01/2021 10:30 AM Pollyann Savoy, MD [facility] None  10/11/2021  2:30 PM Allwardt, Crist Infante, PA-C [facility]    No orders of the defined types were placed in this encounter.     -------------------------------

## 2021-09-01 NOTE — Patient Instructions (Signed)
Increase lamotrigine to 1 and 1/2 tablets daily

## 2021-09-02 ENCOUNTER — Ambulatory Visit (INDEPENDENT_AMBULATORY_CARE_PROVIDER_SITE_OTHER): Payer: Federal, State, Local not specified - PPO | Admitting: Physician Assistant

## 2021-09-02 ENCOUNTER — Other Ambulatory Visit: Payer: Self-pay | Admitting: Obstetrics and Gynecology

## 2021-09-02 ENCOUNTER — Encounter: Payer: Self-pay | Admitting: Physician Assistant

## 2021-09-02 VITALS — BP 144/61 | HR 83 | Resp 18 | Ht 61.0 in | Wt 127.0 lb

## 2021-09-02 DIAGNOSIS — G3184 Mild cognitive impairment, so stated: Secondary | ICD-10-CM | POA: Diagnosis not present

## 2021-09-02 DIAGNOSIS — Z1231 Encounter for screening mammogram for malignant neoplasm of breast: Secondary | ICD-10-CM

## 2021-09-02 NOTE — Patient Instructions (Signed)
It was a pleasure to see you today at our office.   Recommendations:  Follow up in  4 months Try to discontinue wine  Continue Psychiatry follow up  Consider counseling    RECOMMENDATIONS FOR ALL PATIENTS WITH MEMORY PROBLEMS: 1. Continue to exercise (Recommend 30 minutes of walking everyday, or 3 hours every week) 2. Increase social interactions - continue going to [place] and enjoy social gatherings with friends and family 3. Eat healthy, avoid fried foods and eat more fruits and vegetables 4. Maintain adequate blood pressure, blood sugar, and blood cholesterol level. Reducing the risk of stroke and cardiovascular disease also helps promoting better memory. 5. Avoid stressful situations. Live a simple life and avoid aggravations. Organize your time and prepare for the next day in anticipation. 6. Sleep well, avoid any interruptions of sleep and avoid any distractions in the bedroom that may interfere with adequate sleep quality 7. Avoid sugar, avoid sweets as there is a strong link between excessive sugar intake, diabetes, and cognitive impairment We discussed the Mediterranean diet, which has been shown to help patients reduce the risk of progressive memory disorders and reduces cardiovascular risk. This includes eating fish, eat fruits and green leafy vegetables, nuts like almonds and hazelnuts, walnuts, and also use olive oil. Avoid fast foods and fried foods as much as possible. Avoid sweets and sugar as sugar use has been linked to worsening of memory function.  There is always a concern of gradual progression of memory problems. If this is the case, then we may need to adjust level of care according to patient needs. Support, both to the patient and caregiver, should then be put into place.       FALL PRECAUTIONS: Be cautious when walking. Scan the area for obstacles that may increase the risk of trips and falls. When getting up in the mornings, sit up at the edge of the bed for a few  minutes before getting out of bed. Consider elevating the bed at the head end to avoid drop of blood pressure when getting up. Walk always in a well-lit room (use night lights in the walls). Avoid area rugs or power cords from appliances in the middle of the walkways. Use a walker or a cane if necessary and consider physical therapy for balance exercise. Get your eyesight checked regularly.  FINANCIAL OVERSIGHT: Supervision, especially oversight when making financial decisions or transactions is also recommended.  HOME SAFETY: Consider the safety of the kitchen when operating appliances like stoves, microwave oven, and blender. Consider having supervision and share cooking responsibilities until no longer able to participate in those. Accidents with firearms and other hazards in the house should be identified and addressed as well.   ABILITY TO BE LEFT ALONE: If patient is unable to contact 911 operator, consider using LifeLine, or when the need is there, arrange for someone to stay with patients. Smoking is a fire hazard, consider supervision or cessation. Risk of wandering should be assessed by caregiver and if detected at any point, supervision and safe proof recommendations should be instituted.  MEDICATION SUPERVISION: Inability to self-administer medication needs to be constantly addressed. Implement a mechanism to ensure safe administration of the medications.   DRIVING: Regarding driving, in patients with progressive memory problems, driving will be impaired. We advise to have someone else do the driving if trouble finding directions or if minor accidents are reported. Independent driving assessment is available to determine safety of driving.   If you are interested in the driving  assessment, you can contact the following:  [employer] in [place] [phone]  [facility] [phone]  [facility] [phone]  or [phone]    Mediterranean Diet A Mediterranean diet refers to food and lifestyle choices that are based on the traditions of countries located on the [employer]. This way of eating has been shown to help prevent certain conditions and improve outcomes for people who have chronic diseases, like kidney disease and heart disease. What are tips for following this plan? Lifestyle  Cook and eat meals together with your family, when possible. Drink enough fluid to keep your urine clear or pale yellow. Be physically active every day. This includes: Aerobic exercise like running or swimming. Leisure activities like gardening, walking, or housework. Get 7-8 hours of sleep each night. If recommended by your health care provider, drink red wine in moderation. This means 1 glass a day for nonpregnant women and 2 glasses a day for men. A glass of wine equals 5 oz (150 mL). Reading food labels  Check the serving size of packaged foods. For foods such as rice and pasta, the serving size refers to the amount of cooked product, not dry. Check the total fat in packaged foods. Avoid foods that have saturated fat or trans fats. Check the ingredients list for added sugars, such as corn syrup. Shopping  At the grocery store, buy most of your food from the areas near the walls of the store. This includes: Fresh fruits and vegetables (produce). Grains, beans, nuts, and seeds. Some of these may be available in unpackaged forms or large amounts (in bulk). Fresh seafood. Poultry and eggs. Low-fat dairy products. Buy whole ingredients instead of prepackaged foods. Buy fresh fruits and vegetables in-season from local farmers markets. Buy frozen fruits and vegetables in resealable bags. If you do not have access to quality fresh seafood, buy precooked frozen shrimp or canned fish, such as tuna, salmon, or sardines. Buy small amounts of raw or cooked vegetables, salads, or olives from the deli or salad  bar at your store. Stock your pantry so you always have certain foods on hand, such as olive oil, canned tuna, canned tomatoes, rice, pasta, and beans. Cooking  Cook foods with extra-virgin olive oil instead of using butter or other vegetable oils. Have meat as a side dish, and have vegetables or grains as your main dish. This means having meat in small portions or adding small amounts of meat to foods like pasta or stew. Use beans or vegetables instead of meat in common dishes like chili or lasagna. Experiment with different cooking methods. Try roasting or broiling vegetables instead of steaming or sauteing them. Add frozen vegetables to soups, stews, pasta, or rice. Add nuts or seeds for added healthy fat at each meal. You can add these to yogurt, salads, or vegetable dishes. Marinate fish or vegetables using olive oil, lemon juice, garlic, and fresh herbs. Meal planning  Plan to eat 1 vegetarian meal one day each week. Try to work up to 2 vegetarian meals, if possible. Eat seafood 2 or more times a week. Have healthy snacks readily available, such as: Vegetable sticks with hummus. Greek yogurt. Fruit and nut trail mix. Eat balanced meals throughout the week. This includes: Fruit: 2-3 servings a day Vegetables: 4-5 servings a day Low-fat dairy: 2 servings a day Fish, poultry, or lean meat: 1 serving a day Beans and legumes: 2 or more servings a week Nuts and seeds: 1-2  servings a day Whole grains: 6-8 servings a day Extra-virgin olive oil: 3-4 servings a day Limit red meat and sweets to only a few servings a month What are my food choices? Mediterranean diet Recommended Grains: Whole-grain pasta. Brown rice. Bulgar wheat. Polenta. Couscous. Whole-wheat bread. Orpah Cobb. Vegetables: Artichokes. Beets. Broccoli. Cabbage. Carrots. Eggplant. Green beans. Chard. Kale. Spinach. Onions. Leeks. Peas. Squash. Tomatoes. Peppers. Radishes. Fruits: Apples. Apricots. Avocado. Berries.  Bananas. Cherries. Dates. Figs. Grapes. Lemons. Melon. Oranges. Peaches. Plums. Pomegranate. Meats and other protein foods: Beans. Almonds. Sunflower seeds. Pine nuts. Peanuts. Cod. Salmon. Scallops. Shrimp. Tuna. Tilapia. Clams. Oysters. Eggs. Dairy: Low-fat milk. Cheese. Greek yogurt. Beverages: Water. Red wine. Herbal tea. Fats and oils: Extra virgin olive oil. Avocado oil. Grape seed oil. Sweets and desserts: [place] yogurt with honey. Baked apples. Poached pears. Trail mix. Seasoning and other foods: Basil. Cilantro. Coriander. Cumin. Mint. Parsley. Sage. Rosemary. Tarragon. Garlic. Oregano. Thyme. Pepper. Balsalmic vinegar. Tahini. Hummus. Tomato sauce. Olives. Mushrooms. Limit these Grains: Prepackaged pasta or rice dishes. Prepackaged cereal with added sugar. Vegetables: Deep fried potatoes (french fries). Fruits: Fruit canned in syrup. Meats and other protein foods: Beef. Pork. Lamb. Poultry with skin. Hot dogs. Tomasa Blase. Dairy: Ice cream. Sour cream. Whole milk. Beverages: Juice. Sugar-sweetened soft drinks. Beer. Liquor and spirits. Fats and oils: Butter. Canola oil. Vegetable oil. Beef fat (tallow). Lard. Sweets and desserts: Cookies. Cakes. Pies. Candy. Seasoning and other foods: Mayonnaise. Premade sauces and marinades. The items listed may not be a complete list. Talk with your dietitian about what dietary choices are right for you. Summary The Mediterranean diet includes both food and lifestyle choices. Eat a variety of fresh fruits and vegetables, beans, nuts, seeds, and whole grains. Limit the amount of red meat and sweets that you eat. Talk with your health care provider about whether it is safe for you to drink red wine in moderation. This means 1 glass a day for nonpregnant women and 2 glasses a day for men. A glass of wine equals 5 oz (150 mL). This information is not intended to replace advice given to you by your health care provider. Make sure you discuss any questions you  have with your health care provider. Document Released: 08/04/2016 Document Revised: 09/06/2016 Document Reviewed: 08/04/2016 Elsevier Interactive Patient Education  2017 [employer].

## 2021-09-08 ENCOUNTER — Ambulatory Visit (HOSPITAL_BASED_OUTPATIENT_CLINIC_OR_DEPARTMENT_OTHER): Payer: Federal, State, Local not specified - PPO | Admitting: Cardiology

## 2021-09-08 ENCOUNTER — Other Ambulatory Visit: Payer: Self-pay

## 2021-09-08 ENCOUNTER — Encounter (HOSPITAL_BASED_OUTPATIENT_CLINIC_OR_DEPARTMENT_OTHER): Payer: Self-pay | Admitting: Cardiology

## 2021-09-08 VITALS — BP 106/52 | HR 74 | Ht 61.0 in | Wt 125.0 lb

## 2021-09-08 DIAGNOSIS — Z7189 Other specified counseling: Secondary | ICD-10-CM | POA: Diagnosis not present

## 2021-09-08 DIAGNOSIS — R0600 Dyspnea, unspecified: Secondary | ICD-10-CM | POA: Diagnosis not present

## 2021-09-08 DIAGNOSIS — M353 Polymyalgia rheumatica: Secondary | ICD-10-CM | POA: Diagnosis not present

## 2021-09-08 DIAGNOSIS — Z8249 Family history of ischemic heart disease and other diseases of the circulatory system: Secondary | ICD-10-CM

## 2021-09-08 DIAGNOSIS — R0609 Other forms of dyspnea: Secondary | ICD-10-CM

## 2021-09-08 DIAGNOSIS — I10 Essential (primary) hypertension: Secondary | ICD-10-CM | POA: Diagnosis not present

## 2021-09-08 NOTE — Addendum Note (Signed)
Addended by: Jodelle Red A on: 09/08/2021 05:36 PM   Modules accepted: Orders

## 2021-09-08 NOTE — Addendum Note (Signed)
Addended by: Barrie Dunker on: 09/08/2021 04:59 PM   Modules accepted: Orders

## 2021-09-08 NOTE — Addendum Note (Signed)
Addended by: Johney Frame A on: 09/08/2021 12:58 PM   Modules accepted: Orders

## 2021-09-08 NOTE — Patient Instructions (Signed)
Medication Instructions:  Your physician recommends that you continue on your current medications as directed. Please refer to the Current Medication list given to you today.  Testing/Procedures: Your physician has requested that you have an exercise tolerance test. For further information please visit [email or web address] Please also follow instruction sheet, as given.  Follow-Up: At [facility], you and your health needs are our priority.  As part of our continuing mission to provide you with exceptional heart care, we have created designated Provider Care Teams.  These Care Teams include your primary Cardiologist (physician) and Advanced Practice Providers (APPs -  Physician Assistants and Nurse Practitioners) who all work together to provide you with the care you need, when you need it.  We recommend signing up for the patient portal called "MyChart".  Sign up information is provided on this After Visit Summary.  MyChart is used to connect with patients for Virtual Visits (Telemedicine).  Patients are able to view lab/test results, encounter notes, upcoming appointments, etc.  Non-urgent messages can be sent to your provider as well.   To learn more about what you can do with MyChart, go to [email or web address].    Your next appointment:   To be determined-pending test results

## 2021-09-08 NOTE — Progress Notes (Signed)
Cardiology Office Note:    Date:  09/08/2021   ID:  Christine Scott, DOB 1941-11-21, MRN [id number]  PCP:  Orland Mustard, MD  Cardiologist:  Jodelle Red, MD  Referring MD: Julio Sicks, NP   Chief Complaint  Patient presents with   New Patient (Initial Visit)   Shortness of Breath    History of Present Illness:    Christine Scott is a 80 y.o. female with a hx of hypertension, Sjogren's, polymyalgia rheumatica, history of Covid infection 2021 who is seen as a new consult at the request of Parrett, Tammy S, NP for the evaluation and management of dyspnea on exertion.  Note from 06/30/21 with Tammy Parrett reviewed. Noted dyspnea beginning in 02/2020. Normal echo and chest x-ray. PFTs normal. Referred to cardiology for further evaluation.  Today: Main concern is dyspnea on exertion. Walks 2 miles/day 5 days/week. Notes that any time she goes up a hill, she has to stop. Feels like she can't get a deep breath. May have to stop 3 times or more to get up a hill. No issues on walking on level ground. Has stairs at home, has to climb a flight of stairs to get to her condo, no issues with this.  Symptoms predate her Covid infection.  Checks BP at home, highest 140/80, has had occasional lowest numbers 110s/70s. No syncope.  Cardiovascular risk factors: Prior clinical ASCVD: none Comorbid conditions: Endorses hypertension. Denies hyperlipidemia, diabetes, chronic kidney disease  Metabolic syndrome/Obesity: none Chronic inflammatory conditions: PMR and Sjogren's Tobacco use history: never Family history: mother died of heart failure in her late 75s. Father was diabetic, had multiple strokes, passed away age 65. 4 brothers, oldest brother is obese, has had heart issues. Another brother had MI and bypass surgery. Deceased brother had diabetes. Youngest brother also had MI. All of her family has had hypertension. Prior pertinent testing and/or incidental findings:  Has nuclear stress  test 08/28/2003, see below. Normal.   ROS positive for left arm tingling sometimes when she is walking.  Denies chest pain, shortness of breath at rest. No PND, orthopnea, LE edema or unexpected weight gain. No syncope, rare palpitations.   Past Medical History:  Diagnosis Date   Abnormal Pap smear of cervix    --hx abnormal paps in her 30s and per patient this was reason for her Hysterectomy   Anxiety    Depression    Dry eye    Hypertension    Osteoarthritis    hands   Osteoporosis    Polymyalgia rheumatica (HCC)     Past Surgical History:  Procedure Laterality Date   ABDOMINAL HYSTERECTOMY     BREAST SURGERY  2016   benign mass removed in [place], [place].   FACIAL COSMETIC SURGERY     REVERSE SHOULDER ARTHROPLASTY Left 04/16/2020   Procedure: REVERSE SHOULDER ARTHROPLASTY;  Surgeon: Francena Hanly, MD;  Location: [facility];  Service: Orthopedics;  Laterality: Left;    TOTAL VAGINAL HYSTERECTOMY  age 1   --due to abnormal pap smears per patient    Current Medications: Current Outpatient Medications on File Prior to Visit  Medication Sig   acetaminophen (TYLENOL) 500 MG tablet Take 1,000 mg by mouth at bedtime.   amLODipine (NORVASC) 10 MG tablet Take 1 tablet (10 mg total) by mouth daily.   B Complex-Biotin-FA (B-COMPLEX PO) Take 1 tablet by mouth daily. Super   Calcium Carb-Cholecalciferol (CALCIUM CARBONATE-VITAMIN D3) 600-400 MG-UNIT TABS Take 1 tablet by mouth daily.  Cholecalciferol (VITAMIN D3) 50 MCG (2000 UT) TABS Take 2,000 mg by mouth daily.   conjugated estrogens (PREMARIN) vaginal cream Place 1/2 gram per vagina at bedtime twice weekly.   COVID-19 mRNA Vac-TriS, Pfizer, (PFIZER-BIONT COVID-19 VAC-TRIS) SUSP injection Inject into the muscle.   lamoTRIgine (LAMICTAL) 100 MG tablet TAKE ONE TABLET BY MOUTH DAILY   Multiple Vitamins-Minerals (MULTIVITAMIN PO) Take 1 tablet by mouth daily. Centrum silver   Omega-3 Fatty Acids (FISH OIL) 1000 MG CAPS Take 1,000  mg by mouth daily.    Polyethyl Glycol-Propyl Glycol (SYSTANE ULTRA OP) Place 1 drop into both eyes in the morning and at bedtime.    White Petrolatum-Mineral Oil (SYSTANE NIGHTTIME) OINT Place 1 application into both eyes at bedtime as needed (Dry eye).    No current facility-administered medications on file prior to visit.     Allergies:   Patient has no known allergies.   Social History   Tobacco Use   Smoking status: Never   Smokeless tobacco: Never  Vaping Use   Vaping Use: Never used  Substance Use Topics   Alcohol use: Yes    Alcohol/week: 2.0 standard drinks    Types: 2 Glasses of wine per week    Comment: sometimes a bottle of wine a week    Drug use: Never    Family History: family history includes Colon cancer in her brother and daughter; Congestive Heart Failure in her mother; Diabetes in her brother, brother, and father; Stroke in her brother and mother.  ROS:   Please see the history of present illness.  Additional pertinent ROS: Constitutional: Negative for chills, fever, night sweats, unintentional weight loss  HENT: Negative for ear pain and hearing loss.   Eyes: Negative for loss of vision and eye pain.  Respiratory: Negative for cough, sputum, wheezing.   Cardiovascular: See HPI. Gastrointestinal: Negative for abdominal pain, melena, and hematochezia.  Genitourinary: Negative for dysuria and hematuria.  Musculoskeletal: Negative for falls and myalgias.  Skin: Negative for itching and rash.  Neurological: Negative for focal weakness, focal sensory changes and loss of consciousness.  Endo/Heme/Allergies: Does bruise/bleed somewhat easily.    EKGs/Labs/Other Studies Reviewed:    The following studies were reviewed today: Echo 12/09/20 1. Left ventricular ejection fraction, by estimation, is 60 to 65%. The  left ventricle has normal function. The left ventricle has no regional  wall motion abnormalities. Left ventricular diastolic parameters were   normal.   2. Right ventricular systolic function is normal. The right ventricular  size is normal.   3. The mitral valve is normal in structure. Mild mitral valve  regurgitation. No evidence of mitral stenosis.   4. The aortic valve is normal in structure. Aortic valve regurgitation is  mild. No aortic stenosis is present.   5. The inferior vena cava is normal in size with greater than 50%  respiratory variability, suggesting right atrial pressure of 3 mmHg.   08/28/2003 IMPRESSION  NEGATIVE STRESS CARDIOLITE STUDY REVEALING ADEQUATE EXERCISE TOLERANCE, A NORMAL STRESS EKG, NORMAL LEFT VENTRICULAR SIZE AND NORMAL LEFT VENTRICULAR SYSTOLIC FUNCTION.  BY MYOCARDIAL SCINTIGRAPHY, PERFUSION WAS NORMAL WITH NO EVIDENCE FOR INFARCTION OR ISCHEMIA.  MILD BREAST ATTENUATION ARTIFACT. OTHER FINDINGS AS NOTED.  EKG:  EKG is personally reviewed.   09/08/21 NSR at 74 bpm  Recent Labs: 04/15/2021: ALT 11; BUN 15; Creatinine, Ser 0.75; Hemoglobin 11.7; Platelets 213.0; Potassium 4.2; Sodium 136 05/03/2021: TSH 1.92  Recent Lipid Panel    Component Value Date/Time   CHOL 188 04/15/2021  1022   TRIG 64.0 04/15/2021 1022   HDL 85.60 04/15/2021 1022   CHOLHDL 2 04/15/2021 1022   VLDL 12.8 04/15/2021 1022   LDLCALC 89 04/15/2021 1022    Physical Exam:    VS:  BP (!) 106/52 (BP Location: Left Arm, Patient Position: Sitting, Cuff Size: Normal)   Pulse 74   Ht 5\' 1"  (1.549 m)   Wt 125 lb (56.7 kg)   LMP  (LMP Unknown)   BMI 23.62 kg/m     Wt Readings from Last 3 Encounters:  09/08/21 125 lb (56.7 kg)  09/02/21 127 lb (57.6 kg)  06/10/21 127 lb 9.6 oz (57.9 kg)    GEN: Well nourished, well developed in no acute distress HEENT: Normal, moist mucous membranes NECK: No JVD CARDIAC: regular rhythm, normal S1 and S2, no rubs or gallops. No murmur. VASCULAR: Radial and DP pulses 2+ bilaterally. No carotid bruits RESPIRATORY:  Clear to auscultation without rales, wheezing or rhonchi  ABDOMEN: Soft,  non-tender, non-distended MUSCULOSKELETAL:  Ambulates independently SKIN: Warm and dry, no edema NEUROLOGIC:  Alert and oriented x 3. No focal neuro deficits noted. PSYCHIATRIC:  Normal affect    ASSESSMENT:    1. Dyspnea on exertion   2. Essential hypertension   3. Polymyalgia rheumatica (HCC)   4. Cardiac risk counseling   5. Family history of heart disease   6. Counseling on health promotion and disease prevention    PLAN:    Dyspnea on exertion Family history of heart disease Risk factor of inflammatory disease (PMR, Sjogren's) and hypertension We spent significant time today reviewing different parts of the cardiovascular system (electrical, vascular, functional, and valvular). We discussed how each of these systems can present with different symptoms. We reviewed that there are different ways we evaluate these symptoms with tests. We reviewed which tests I think are most appropriate given the symptoms, and we discussed risks/benefits and limitations of each of these tests. Please see summary below. We also discussed that if testing is unrevealing for a cardiac cause of the symptoms, there are many noncardiac causes as well that can contribute to symptoms. If the heart is ruled out, then I recommend returning to PCP to discuss alternative diagnoses.  -discussed treadmill stress, nuclear stress/lexiscan, and CT coronary angiography. Discussed pros and cons of each, including but not limited to false positive/false negative risk, radiation risk, and risk of IV contrast dye. Based on shared decision making, decision was made to pursue exercise treadmill stress test given her fitness level and her symptoms are only on an incline.  Hypertension: -borderline low today, but reports normal control at home -continue amlodipine 10 mg daily for now, adjust if needed based on stress test  Cardiac risk counseling and prevention recommendations: -recommend heart healthy/Mediterranean diet, with  whole grains, fruits, vegetable, fish, lean meats, nuts, and olive oil. Limit salt. -recommend moderate walking, 3-5 times/week for 30-50 minutes each session. Aim for at least 150 minutes.week. Goal should be pace of 3 miles/hours, or walking 1.5 miles in 30 minutes -recommend avoidance of tobacco products. Avoid excess alcohol. -ASCVD risk score: The ASCVD Risk score (Arnett DK, et al., 2019) failed to calculate for the following reasons:   The 2019 ASCVD risk score is only valid for ages 29 to 18    Plan for follow up: to be determined based on results of testing  75, MD, PhD, [facility]  [facility]    Medication Adjustments/Labs and Tests Ordered: Current medicines are reviewed  at length with the patient today.  Concerns regarding medicines are outlined above.  Orders Placed This Encounter  Procedures   EXERCISE TOLERANCE TEST (ETT)    No orders of the defined types were placed in this encounter.   Patient Instructions  Medication Instructions:  Your physician recommends that you continue on your current medications as directed. Please refer to the Current Medication list given to you today.  Testing/Procedures: Your physician has requested that you have an exercise tolerance test. For further information please visit [email or web address] Please also follow instruction sheet, as given.  Follow-Up: At [facility], you and your health needs are our priority.  As part of our continuing mission to provide you with exceptional heart care, we have created designated Provider Care Teams.  These Care Teams include your primary Cardiologist (physician) and Advanced Practice Providers (APPs -  Physician Assistants and Nurse Practitioners) who all work together to provide you with the care you need, when you need it.  We recommend signing up for the patient portal called "MyChart".  Sign up information is provided on this After Visit Summary.  MyChart is used  to connect with patients for Virtual Visits (Telemedicine).  Patients are able to view lab/test results, encounter notes, upcoming appointments, etc.  Non-urgent messages can be sent to your provider as well.   To learn more about what you can do with MyChart, go to [email or web address].    Your next appointment:   To be determined-pending test results  Signed, Jodelle Red, MD PhD 09/08/2021 10:54 AM    [facility]

## 2021-09-09 ENCOUNTER — Encounter: Payer: Self-pay | Admitting: Family Medicine

## 2021-09-09 ENCOUNTER — Ambulatory Visit (INDEPENDENT_AMBULATORY_CARE_PROVIDER_SITE_OTHER): Payer: Federal, State, Local not specified - PPO

## 2021-09-09 DIAGNOSIS — Z23 Encounter for immunization: Secondary | ICD-10-CM

## 2021-09-14 ENCOUNTER — Ambulatory Visit (INDEPENDENT_AMBULATORY_CARE_PROVIDER_SITE_OTHER): Payer: Federal, State, Local not specified - PPO | Admitting: Obstetrics and Gynecology

## 2021-09-14 ENCOUNTER — Telehealth (HOSPITAL_COMMUNITY): Payer: Self-pay | Admitting: *Deleted

## 2021-09-14 ENCOUNTER — Other Ambulatory Visit: Payer: Self-pay

## 2021-09-14 ENCOUNTER — Encounter: Payer: Self-pay | Admitting: Obstetrics and Gynecology

## 2021-09-14 VITALS — BP 122/68 | HR 60 | Resp 14 | Ht 61.5 in | Wt 127.0 lb

## 2021-09-14 DIAGNOSIS — Z01419 Encounter for gynecological examination (general) (routine) without abnormal findings: Secondary | ICD-10-CM | POA: Diagnosis not present

## 2021-09-14 MED ORDER — PREMARIN 0.625 MG/GM VA CREA: TOPICAL_CREAM | VAGINAL | 1 refills | Status: DC

## 2021-09-14 NOTE — Telephone Encounter (Signed)
Close encounter 

## 2021-09-14 NOTE — Patient Instructions (Signed)

## 2021-09-14 NOTE — Progress Notes (Signed)
80 y.o. G6P2004 Widowed Caucasian female here for annual exam.   Uses Premarin vaginal cream for atrophy, and this is helpful for dryness.  Using 1/2 twice a week.   Dealing with bursitis.   PCP:  Mickey Farber, MD   No LMP recorded (lmp unknown). Patient has had a hysterectomy.           Sexually active: No.  The current method of family planning is status post hysterectomy.    Exercising: Yes.     Walks 2 miles/da Smoker:  no  Health Maintenance: Pap:  04-27-18 Neg, 04-13-16 Neg, 2002 Neg History of abnormal Pap:  yes,  hx abnormal paps in her 30s and the reason for her hysterectomy MMG:  07-15-20 3D/Neg/BiRads1.  Has an appointment in October.  Colonoscopy: 2018 normal per patient--no need for further testing  BMD:   11-27-19 Result :Osteopenia--scheduled towards end of year.  Taking Prolia.  Dr. Erlene Senters following her bone density.  TDaP:  PCP Gardasil:   no HIV: 04-27-18 NR Hep C: 04-27-18 Neg Screening Labs:  PCP.   reports that she has never smoked. She has never used smokeless tobacco. She reports current alcohol use of about 2.0 standard drinks per week. She reports that she does not use drugs.  Past Medical History:  Diagnosis Date   Abnormal Pap smear of cervix    --hx abnormal paps in her 30s and per patient this was reason for her Hysterectomy   Anxiety    Bursitis 12/26/2020   Depression    Dry eye    Hypertension    Osteoarthritis    hands   Osteoporosis    Polymyalgia rheumatica (HCC)     Past Surgical History:  Procedure Laterality Date   ABDOMINAL HYSTERECTOMY     BREAST SURGERY  2016   benign mass removed in [place], [place].   FACIAL COSMETIC SURGERY     REVERSE SHOULDER ARTHROPLASTY Left 04/16/2020   Procedure: REVERSE SHOULDER ARTHROPLASTY;  Surgeon: Francena Hanly, MD;  Location: [facility];  Service: Orthopedics;  Laterality: Left;    TOTAL VAGINAL HYSTERECTOMY  age 32   --due to abnormal pap smears per patient    Current Outpatient Medications   Medication Sig Dispense Refill   acetaminophen (TYLENOL) 500 MG tablet Take 1,000 mg by mouth at bedtime.     amLODipine (NORVASC) 10 MG tablet Take 1 tablet (10 mg total) by mouth daily. 90 tablet 3   B Complex-Biotin-FA (B-COMPLEX PO) Take 1 tablet by mouth daily. Super     Calcium Carb-Cholecalciferol (CALCIUM CARBONATE-VITAMIN D3) 600-400 MG-UNIT TABS Take 1 tablet by mouth daily.      Cholecalciferol (VITAMIN D3) 50 MCG (2000 UT) TABS Take 2,000 mg by mouth daily.     conjugated estrogens (PREMARIN) vaginal cream Place 1/2 gram per vagina at bedtime twice weekly. 30 g 1   denosumab (PROLIA) 60 MG/ML SOSY injection Inject 60 mg into the skin every 6 (six) months.     lamoTRIgine (LAMICTAL) 100 MG tablet TAKE ONE TABLET BY MOUTH DAILY 90 tablet 1   Multiple Vitamins-Minerals (MULTIVITAMIN PO) Take 1 tablet by mouth daily. Centrum silver     Omega-3 Fatty Acids (FISH OIL) 1000 MG CAPS Take 1,000 mg by mouth daily.      Polyethyl Glycol-Propyl Glycol (SYSTANE ULTRA OP) Place 1 drop into both eyes in the morning and at bedtime.      White Petrolatum-Mineral Oil (SYSTANE NIGHTTIME) OINT Place 1 application into both eyes at bedtime as  needed (Dry eye).      No current facility-administered medications for this visit.    Family History  Problem Relation Age of Onset   Congestive Heart Failure Mother    Stroke Mother    Diabetes Father    Colon cancer Brother    Diabetes Brother    Diabetes Brother    Stroke Brother    Colon cancer Daughter     Review of Systems  All other systems reviewed and are negative.  Exam:   BP 122/68   Pulse 60 Comment: slightly irregular  Resp 14   Ht 5' 1.5" (1.562 m)   Wt 127 lb (57.6 kg)   LMP  (LMP Unknown)   BMI 23.61 kg/m     General appearance: alert, cooperative and appears stated age Head: normocephalic, without obvious abnormality, atraumatic Neck: no adenopathy, supple, symmetrical, trachea midline and thyroid normal to inspection and  palpation Lungs: clear to auscultation bilaterally Breasts: normal appearance, no masses or tenderness, No nipple retraction or dimpling, No nipple discharge or bleeding, No axillary adenopathy Heart: regular rate and rhythm Abdomen: soft, non-tender; no masses, no organomegaly Extremities: extremities normal, atraumatic, no cyanosis or edema Skin: skin color, texture, turgor normal. No rashes or lesions Lymph nodes: cervical, supraclavicular, and axillary nodes normal. Neurologic: grossly normal  Pelvic: External genitalia:  no lesions              No abnormal inguinal nodes palpated.              Urethra:  normal appearing urethra with no masses, tenderness or lesions              Bartholins and Skenes: normal                 Vagina: normal appearing vagina with normal color and discharge, no lesions              Cervix: absent              Pap taken: no Bimanual Exam:  Uterus:  absent              Adnexa: no mass, fullness, tenderness              Rectal exam: yes.  Confirms.              Anus:  normal sphincter tone, no lesions  Chaperone was present for exam:  Marchelle Folks, CMA  Assessment:   Well woman visit with gynecologic exam. Status post TVH for abnormal paps in her 30s.  Ovaries remain.  Vaginal atrophy.  Osteopenia.  On Prolia.  Depression.   Plan: Mammogram screening discussed. Self breast awareness reviewed. Pap and HR HPV as above. Guidelines for Calcium, Vitamin D, regular exercise program including cardiovascular and weight bearing exercise. Refill of Premarin vaginal cream.  I discussed potential effect on breast cancer.  Labs with PCP.   Follow up annually and prn.   After visit summary provided.

## 2021-09-15 ENCOUNTER — Ambulatory Visit (HOSPITAL_COMMUNITY)
Admission: RE | Admit: 2021-09-15 | Payer: Federal, State, Local not specified - PPO | Source: Ambulatory Visit | Attending: Cardiology | Admitting: Cardiology
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
## 2021-09-24 ENCOUNTER — Telehealth (HOSPITAL_COMMUNITY): Payer: Self-pay | Admitting: *Deleted

## 2021-09-24 NOTE — Telephone Encounter (Signed)
Close encounter 

## 2021-09-29 ENCOUNTER — Other Ambulatory Visit: Payer: Self-pay

## 2021-09-29 ENCOUNTER — Ambulatory Visit (HOSPITAL_COMMUNITY)
Admission: RE | Admit: 2021-09-29 | Discharge: 2021-09-29 | Disposition: A | Discharge status: Home / Self Care | Payer: Federal, State, Local not specified - PPO | Source: Ambulatory Visit | Attending: Cardiovascular Disease | Admitting: Cardiovascular Disease

## 2021-09-29 ENCOUNTER — Ambulatory Visit (INDEPENDENT_AMBULATORY_CARE_PROVIDER_SITE_OTHER): Payer: Federal, State, Local not specified - PPO

## 2021-09-29 DIAGNOSIS — R0609 Other forms of dyspnea: Secondary | ICD-10-CM | POA: Insufficient documentation

## 2021-09-29 DIAGNOSIS — M810 Age-related osteoporosis without current pathological fracture: Secondary | ICD-10-CM | POA: Diagnosis not present

## 2021-09-29 LAB — EXERCISE TOLERANCE TEST
Angina Index: 0
Duke Treadmill Score: 8
Estimated workload: 10.1
Exercise duration (min): 8 min
Exercise duration (sec): 11 s
MPHR: 139 {beats}/min
Peak HR: 118 {beats}/min
Percent HR: 84 %
Rest HR: 64 {beats}/min
ST Depression (mm): 0 mm

## 2021-09-29 MED ORDER — DENOSUMAB 60 MG/ML ~~LOC~~ SOSY
60.0000 mg | PREFILLED_SYRINGE | Freq: Once | SUBCUTANEOUS | Status: AC
  Administered 2021-09-29: 60 mg via SUBCUTANEOUS

## 2021-09-29 NOTE — Progress Notes (Signed)
Christine Scott 80 yr old female presents to office today for 6 month PROLIA injection. Administered DENOSUMAB 60 mg/mL subcu right arm. Patient tolerated well.

## 2021-09-30 ENCOUNTER — Ambulatory Visit: Payer: Federal, State, Local not specified - PPO | Admitting: Adult Health

## 2021-10-01 ENCOUNTER — Other Ambulatory Visit: Payer: Self-pay

## 2021-10-01 ENCOUNTER — Ambulatory Visit: Payer: Federal, State, Local not specified - PPO | Admitting: Rheumatology

## 2021-10-01 ENCOUNTER — Encounter: Payer: Self-pay | Admitting: Rheumatology

## 2021-10-01 VITALS — BP 133/71 | HR 66 | Ht 61.0 in | Wt 127.0 lb

## 2021-10-01 DIAGNOSIS — M19041 Primary osteoarthritis, right hand: Secondary | ICD-10-CM | POA: Diagnosis not present

## 2021-10-01 DIAGNOSIS — Z8659 Personal history of other mental and behavioral disorders: Secondary | ICD-10-CM

## 2021-10-01 DIAGNOSIS — M7062 Trochanteric bursitis, left hip: Secondary | ICD-10-CM

## 2021-10-01 DIAGNOSIS — M353 Polymyalgia rheumatica: Secondary | ICD-10-CM | POA: Diagnosis not present

## 2021-10-01 DIAGNOSIS — M3501 Sicca syndrome with keratoconjunctivitis: Secondary | ICD-10-CM | POA: Diagnosis not present

## 2021-10-01 DIAGNOSIS — M5030 Other cervical disc degeneration, unspecified cervical region: Secondary | ICD-10-CM

## 2021-10-01 DIAGNOSIS — R0602 Shortness of breath: Secondary | ICD-10-CM

## 2021-10-01 DIAGNOSIS — M19042 Primary osteoarthritis, left hand: Secondary | ICD-10-CM

## 2021-10-01 DIAGNOSIS — M19072 Primary osteoarthritis, left ankle and foot: Secondary | ICD-10-CM

## 2021-10-01 DIAGNOSIS — M5136 Other intervertebral disc degeneration, lumbar region: Secondary | ICD-10-CM

## 2021-10-01 DIAGNOSIS — R413 Other amnesia: Secondary | ICD-10-CM

## 2021-10-01 DIAGNOSIS — Z8679 Personal history of other diseases of the circulatory system: Secondary | ICD-10-CM

## 2021-10-01 DIAGNOSIS — Z96612 Presence of left artificial shoulder joint: Secondary | ICD-10-CM | POA: Diagnosis not present

## 2021-10-01 DIAGNOSIS — M19071 Primary osteoarthritis, right ankle and foot: Secondary | ICD-10-CM

## 2021-10-01 DIAGNOSIS — M810 Age-related osteoporosis without current pathological fracture: Secondary | ICD-10-CM

## 2021-10-01 NOTE — Patient Instructions (Addendum)
Cervical Strain and Sprain Rehab Ask your health care provider which exercises are safe for you. Do exercises exactly as told by your health care provider and adjust them as directed. It is normal to feel mild stretching, pulling, tightness, or discomfort as you do these exercises. Stop right away if you feel sudden pain or your pain gets worse. Do not begin these exercises until told by your health care provider. Stretching and range-of-motion exercises Cervical side bending  Using good posture, sit on a stable chair or stand up. Without moving your shoulders, slowly tilt your left / right ear to your shoulder until you feel a stretch in the opposite side neck muscles. You should be looking straight ahead. Hold for __________ seconds. Repeat with the other side of your neck. Repeat __________ times. Complete this exercise __________ times a day. Cervical rotation  Using good posture, sit on a stable chair or stand up. Slowly turn your head to the side as if you are looking over your left / right shoulder. Keep your eyes level with the ground. Stop when you feel a stretch along the side and the back of your neck. Hold for __________ seconds. Repeat this by turning to your other side. Repeat __________ times. Complete this exercise __________ times a day. Thoracic extension and pectoral stretch Roll a towel or a small blanket so it is about 4 inches (10 cm) in diameter. Lie down on your back on a firm surface. Put the towel lengthwise, under your spine in the middle of your back. It should not be under your shoulder blades. The towel should line up with your spine from your middle back to your lower back. Put your hands behind your head and let your elbows fall out to your sides. Hold for __________ seconds. Repeat __________ times. Complete this exercise __________ times a day. Strengthening exercises Isometric upper cervical flexion Lie on your back with a thin pillow behind your head  and a small rolled-up towel under your neck. Gently tuck your chin toward your chest and nod your head down to look toward your feet. Do not lift your head off the pillow. Hold for __________ seconds. Release the tension slowly. Relax your neck muscles completely before you repeat this exercise. Repeat __________ times. Complete this exercise __________ times a day. Isometric cervical extension  Stand about 6 inches (15 cm) away from a wall, with your back facing the wall. Place a soft object, about 6-8 inches (15-20 cm) in diameter, between the back of your head and the wall. A soft object could be a small pillow, a ball, or a folded towel. Gently tilt your head back and press into the soft object. Keep your jaw and forehead relaxed. Hold for __________ seconds. Release the tension slowly. Relax your neck muscles completely before you repeat this exercise. Repeat __________ times. Complete this exercise __________ times a day. Posture and body mechanics Body mechanics refers to the movements and positions of your body while you do your daily activities. Posture is part of body mechanics. Good posture and healthy body mechanics can help to relieve stress in your body's tissues and joints. Good posture means that your spine is in its natural S-curve position (your spine is neutral), your shoulders are pulled back slightly, and your head is not tipped forward. The following are general guidelines for applying improved posture and body mechanics to your everyday activities. Sitting  When sitting, keep your spine neutral and keep your feet flat on the floor.   Use a footrest, if necessary, and keep your thighs parallel to the floor. Avoid rounding your shoulders, and avoid tilting your head forward. When working at a desk or a computer, keep your desk at a height where your hands are slightly lower than your elbows. Slide your chair under your desk so you are close enough to maintain good posture. When  working at a computer, place your monitor at a height where you are looking straight ahead and you do not have to tilt your head forward or downward to look at the screen. Standing  When standing, keep your spine neutral and keep your feet about hip-width apart. Keep a slight bend in your knees. Your ears, shoulders, and hips should line up. When you do a task in which you stand in one place for a long time, place one foot up on a stable object that is 2-4 inches (5-10 cm) high, such as a footstool. This helps keep your spine neutral. Resting When lying down and resting, avoid positions that are most painful for you. Try to support your neck in a neutral position. You can use a contour pillow or a small rolled-up towel. Your pillow should support your neck but not push on it. This information is not intended to replace advice given to you by your health care provider. Make sure you discuss any questions you have with your health care provider. Document Revised: 04/03/2019 Document Reviewed: 09/12/2018 Elsevier Patient Education  2022 [employer] Iliotibial Band Syndrome Rehab Ask your health care provider which exercises are safe for you. Do exercises exactly as told by your health care provider and adjust them as directed. It is normal to feel mild stretching, pulling, tightness, or discomfort as you do these exercises. Stop right away if you feel sudden pain or your pain gets significantly worse. Do not begin these exercises until told by your health care provider. Stretching and range-of-motion exercises These exercises warm up your muscles and joints and improve the movement and flexibility of your hip and pelvis. Quadriceps stretch, prone  Lie on your abdomen (prone position) on a firm surface, such as a bed or padded floor. Bend your left / right knee and reach back to hold your ankle or pant leg. If you cannot reach your ankle or pant leg, loop a belt around your foot and grab the belt  instead. Gently pull your heel toward your buttocks. Your knee should not slide out to the side. You should feel a stretch in the front of your thigh and knee (quadriceps). Hold this position for __________ seconds. Repeat __________ times. Complete this exercise __________ times a day. Iliotibial band stretch An iliotibial band is a strong band of muscle tissue that runs from the outer side of your hip to the outer side of your thigh and knee. Lie on your side with your left / right leg in the top position. Bend both of your knees and grab your left / right ankle. Stretch out your bottom arm to help you balance. Slowly bring your top knee back so your thigh goes behind your trunk. Slowly lower your top leg toward the floor until you feel a gentle stretch on the outside of your left / right hip and thigh. If you do not feel a stretch and your knee will not fall farther, place the heel of your other foot on top of your knee and pull your knee down toward the floor with your foot. Hold this position for __________ seconds.  Repeat __________ times. Complete this exercise __________ times a day. Strengthening exercises These exercises build strength and endurance in your hip and pelvis. Endurance is the ability to use your muscles for a long time, even after they get tired. Straight leg raises, side-lying This exercise strengthens the muscles that rotate the leg at the hip and move it away from your body (hip abductors). Lie on your side with your left / right leg in the top position. Lie so your head, shoulder, hip, and knee line up. You may bend your bottom knee to help you balance. Roll your hips slightly forward so your hips are stacked directly over each other and your left / right knee is facing forward. Tense the muscles in your outer thigh and lift your top leg 4-6 inches (10-15 cm). Hold this position for __________ seconds. Slowly lower your leg to return to the starting position. Let your  muscles relax completely before doing another repetition. Repeat __________ times. Complete this exercise __________ times a day. Leg raises, prone This exercise strengthens the muscles that move the hips backward (hip extensors). Lie on your abdomen (prone position) on your bed or a firm surface. You can put a pillow under your hips if that is more comfortable for your lower back. Bend your left / right knee so your foot is straight up in the air. Squeeze your buttocks muscles and lift your left / right thigh off the bed. Do not let your back arch. Tense your thigh muscle as hard as you can without increasing any knee pain. Hold this position for __________ seconds. Slowly lower your leg to return to the starting position and allow it to relax completely. Repeat __________ times. Complete this exercise __________ times a day. Hip hike Stand sideways on a bottom step. Stand on your left / right leg with your other foot unsupported next to the step. You can hold on to a railing or wall for balance if needed. Keep your knees straight and your torso square. Then lift your left / right hip up toward the ceiling. Slowly let your left / right hip lower toward the floor, past the starting position. Your foot should get closer to the floor. Do not lean or bend your knees. Repeat __________ times. Complete this exercise __________ times a day. This information is not intended to replace advice given to you by your health care provider. Make sure you discuss any questions you have with your health care provider. Document Revised: 02/19/2020 Document Reviewed: 02/19/2020 Elsevier Patient Education  2022 [employer].

## 2021-10-06 ENCOUNTER — Ambulatory Visit
Admission: RE | Admit: 2021-10-06 | Discharge: 2021-10-06 | Disposition: A | Discharge status: Home / Self Care | Payer: Federal, State, Local not specified - PPO | Source: Ambulatory Visit | Attending: Obstetrics and Gynecology | Admitting: Obstetrics and Gynecology

## 2021-10-06 ENCOUNTER — Other Ambulatory Visit: Payer: Self-pay

## 2021-10-06 DIAGNOSIS — Z1231 Encounter for screening mammogram for malignant neoplasm of breast: Secondary | ICD-10-CM

## 2021-10-11 ENCOUNTER — Encounter: Payer: Federal, State, Local not specified - PPO | Admitting: Physician Assistant

## 2021-10-18 ENCOUNTER — Ambulatory Visit: Payer: Federal, State, Local not specified - PPO | Admitting: Family

## 2021-10-18 ENCOUNTER — Encounter: Payer: Self-pay | Admitting: Family

## 2021-10-18 ENCOUNTER — Telehealth: Payer: Self-pay | Admitting: Family

## 2021-10-18 ENCOUNTER — Other Ambulatory Visit: Payer: Self-pay

## 2021-10-18 ENCOUNTER — Other Ambulatory Visit (HOSPITAL_BASED_OUTPATIENT_CLINIC_OR_DEPARTMENT_OTHER): Payer: Self-pay

## 2021-10-18 VITALS — BP 140/80 | HR 73 | Temp 97.6°F | Ht 61.0 in

## 2021-10-18 DIAGNOSIS — M810 Age-related osteoporosis without current pathological fracture: Secondary | ICD-10-CM

## 2021-10-18 DIAGNOSIS — I10 Essential (primary) hypertension: Secondary | ICD-10-CM | POA: Diagnosis not present

## 2021-10-18 DIAGNOSIS — Z23 Encounter for immunization: Secondary | ICD-10-CM | POA: Insufficient documentation

## 2021-10-18 HISTORY — DX: Encounter for immunization: Z23

## 2021-10-18 MED ORDER — ZOSTER VAC RECOMB ADJUVANTED 50 MCG/0.5ML IM SUSR
INTRAMUSCULAR | 1 refills | Status: DC
  Filled 2021-10-18: qty 0.5, 1d supply, fill #0

## 2021-10-18 MED ORDER — BOOSTRIX 5-2.5-18.5 LF-MCG/0.5 IM SUSY
PREFILLED_SYRINGE | INTRAMUSCULAR | 0 refills | Status: DC
  Filled 2021-10-18: qty 0.5, 1d supply, fill #0

## 2021-10-18 NOTE — Assessment & Plan Note (Addendum)
Stable on Norvasc qd. Last labs done in 06/2021 all in normal range.

## 2021-10-18 NOTE — Patient Instructions (Addendum)
It was very nice to see you today!  Please schedule a 3 month follow up visit.  As discussed, it is not necessary to wait to receive your next Covid19 booster shot. You are OK to go ahead and receive your Shingles and Tetanus/Diptheria (TD) vaccines through your pharmacy also, but wait 1 week inbetween. Call them to schedule.   PLEASE NOTE:  If you had any lab tests please let us know if you have not heard back within a few days. You may see your results on mychart before we have a chance to review them but we will give you a call once they are reviewed by [place]. If we ordered any referrals today, please let us know if you have not heard from their office within the next week.   Please try these tips to maintain a healthy lifestyle:  Eat most of your calories during the day when you are active. Eliminate processed foods including packaged sweets (pies, cakes, cookies), reduce intake of potatoes, white bread, white pasta, and white rice. Look for whole grain options, oat flour or almond flour.  Each meal should contain half fruits/vegetables, one quarter protein, and one quarter carbs (no bigger than a computer mouse).  Cut down on sweet beverages. This includes juice, soda, and sweet tea. Also watch fruit intake, though this is a healthier sweet option, it still contains natural sugar! Limit to 3 servings daily.  Drink at least 1 glass of water with each meal and aim for at least 8 glasses per day  Exercise at least 150 minutes every week.

## 2021-10-18 NOTE — Assessment & Plan Note (Signed)
Last Prolia injection 09/29/2021, told to wait 6 weeks before getting her covid19 booster shot, advised patient that she does not to wait that long, she should be fine to get the booster now.

## 2021-10-18 NOTE — Progress Notes (Signed)
Subjective:     Patient ID: Christine Scott, female    DOB: [birth year], 80 y.o.   MRN: [id number]  Chief Complaint  Patient presents with   Hypertension   Osteoarthritis   Transitions Of Care   HPI: Patient is in today to establish care and for follow up. Patient is currently maintained on the following medications for blood pressure: Norvasc Failed meds include none. Patient reports good compliance with blood pressure medications. Patient denies chest pain, shortness of breath or swelling. Last 3 blood pressure readings in our office are as follows: BP Readings from Last 3 Encounters:  10/18/21 140/80  10/01/21 133/71  09/14/21 122/68   Osteoporosis: Patient complains of osteoporosis. She was first diagnosed with osteoporosis by bone density scan in 2010. Patient denies history of fracture. She started taking Prolia around 2016 and is tolerating. Last injection was 09/29/2021 and she was told that she should not have her next Covid19 booster shot until 6 weeks after, wanting to confirm if this is true.   Health Maintenance Due  Topic Date Due   Zoster Vaccines- Shingrix (1 of 2) Never done   TETANUS/TDAP  12/27/2019    Past Medical History:  Diagnosis Date   Abnormal Pap smear of cervix    --hx abnormal paps in her 30s and per patient this was reason for her Hysterectomy   Anxiety    Bursitis 12/26/2020   Depression    Dry eye    Hypertension    Osteoarthritis    hands   Osteoporosis    Polymyalgia rheumatica (HCC)     Past Surgical History:  Procedure Laterality Date   ABDOMINAL HYSTERECTOMY     BREAST SURGERY  2016   benign mass removed in [place], [place].   FACIAL COSMETIC SURGERY     REVERSE SHOULDER ARTHROPLASTY Left 04/16/2020   Procedure: REVERSE SHOULDER ARTHROPLASTY;  Surgeon: Francena Hanly, MD;  Location: [facility];  Service: Orthopedics;  Laterality: Left;    TOTAL VAGINAL HYSTERECTOMY  age 63   --due to abnormal pap smears per patient     Outpatient Medications Prior to Visit  Medication Sig Dispense Refill   acetaminophen (TYLENOL) 500 MG tablet Take 1,000 mg by mouth at bedtime.     amLODipine (NORVASC) 10 MG tablet Take 1 tablet (10 mg total) by mouth daily. 90 tablet 3   B Complex-Biotin-FA (B-COMPLEX PO) Take 1 tablet by mouth daily. Super     Calcium Carb-Cholecalciferol (CALCIUM CARBONATE-VITAMIN D3) 600-400 MG-UNIT TABS Take 1 tablet by mouth daily.      Cholecalciferol (VITAMIN D3) 50 MCG (2000 UT) TABS Take 2,000 mg by mouth daily.     conjugated estrogens (PREMARIN) vaginal cream Place 1/2 gram per vagina at bedtime twice weekly. 30 g 1   denosumab (PROLIA) 60 MG/ML SOSY injection Inject 60 mg into the skin every 6 (six) months.     lamoTRIgine (LAMICTAL) 100 MG tablet TAKE ONE TABLET BY MOUTH DAILY 90 tablet 1   Multiple Vitamins-Minerals (MULTIVITAMIN PO) Take 1 tablet by mouth daily. Centrum silver     Omega-3 Fatty Acids (FISH OIL) 1000 MG CAPS Take 1,000 mg by mouth daily.      Polyethyl Glycol-Propyl Glycol (SYSTANE ULTRA OP) Place 1 drop into both eyes in the morning and at bedtime.      White Petrolatum-Mineral Oil (SYSTANE NIGHTTIME) OINT Place 1 application into both eyes at bedtime as needed (Dry eye).      No facility-administered medications prior to  visit.    No Known Allergies      Objective:    Physical Exam Vitals and nursing note reviewed.  Constitutional:      Appearance: Normal appearance.  Cardiovascular:     Rate and Rhythm: Normal rate and regular rhythm.  Pulmonary:     Effort: Pulmonary effort is normal.     Breath sounds: Normal breath sounds.  Musculoskeletal:        General: Normal range of motion.  Skin:    General: Skin is warm and dry.  Neurological:     Mental Status: She is alert.  Psychiatric:        Mood and Affect: Mood normal.        Behavior: Behavior normal.    BP 140/80   Pulse 73   Temp 97.6 F (36.4 C) (Temporal)   Ht 5\' 1"  (1.549 m)   LMP   (LMP Unknown)   SpO2 97%   BMI 24.00 kg/m  Wt Readings from Last 3 Encounters:  10/01/21 127 lb (57.6 kg)  09/14/21 127 lb (57.6 kg)  09/08/21 125 lb (56.7 kg)       Assessment & Plan:   Problem List Items Addressed This Visit       Cardiovascular and Mediastinum   Essential hypertension - Primary    Stable on Norvasc qd. Last labs done in 06/2021 all in normal range.        Musculoskeletal and Integument   Osteoporosis    Last Prolia injection 09/29/2021, told to wait 6 weeks before getting her covid19 booster shot, advised patient that she does not to wait that long, she should be fine to get the booster now.         Other   Encounter for herpes zoster vaccination    I am having 11/29/2021. Walters maintain her B Complex-Biotin-FA (B-COMPLEX PO), Fish Oil, Multiple Vitamins-Minerals (MULTIVITAMIN PO), Calcium Carbonate-Vitamin D3, Polyethyl Glycol-Propyl Glycol (SYSTANE ULTRA OP), Systane Nighttime, Vitamin D3, acetaminophen, amLODipine, lamoTRIgine, denosumab, and Premarin.  No orders of the defined types were placed in this encounter.

## 2021-10-18 NOTE — Assessment & Plan Note (Signed)
Received a steroid injection last month and lasted a few weeks. She has a f/u appt on Thursday, advised she discuss the next steps with them, PT, other meds, etc.

## 2021-10-19 NOTE — Telephone Encounter (Signed)
Pt was called to give message below. Pt states that she received her Shingles and Tdap vaccines on 10/18/2021. She cannot remember the name of the pharmacy. Immunizations have been updated.

## 2021-10-19 NOTE — Telephone Encounter (Signed)
-----   Message from Dulce Sellar, NP sent at 10/18/2021  3:55 PM EDT ----- Regarding: covid19 vaccine Please call Narelle and let her know I researched again about waiting on her Covid19 booster shot and she does NOT have to wait any longer to get this.  It is only recommended to wait 1 week, and she is well past that. So remind her she can also get the covid booster from her pharmacist and probably a good idea to wait 1 week before getting the Shingrix vaccine to not overwhelm her immune system.  Thanks

## 2021-10-21 ENCOUNTER — Other Ambulatory Visit: Payer: Self-pay | Admitting: Sports Medicine

## 2021-10-21 DIAGNOSIS — M545 Low back pain, unspecified: Secondary | ICD-10-CM

## 2021-10-28 ENCOUNTER — Telehealth: Payer: Self-pay

## 2021-10-28 NOTE — Telephone Encounter (Signed)
Patient approved for Prolia 09/18/21 - 10/18/2022  This PA determination only applies to medications dispensed by the pharmacy

## 2021-11-03 ENCOUNTER — Encounter: Payer: Self-pay | Admitting: Psychiatry

## 2021-11-03 ENCOUNTER — Other Ambulatory Visit: Payer: Self-pay

## 2021-11-03 ENCOUNTER — Ambulatory Visit: Payer: Federal, State, Local not specified - PPO | Admitting: Psychiatry

## 2021-11-03 DIAGNOSIS — F411 Generalized anxiety disorder: Secondary | ICD-10-CM | POA: Diagnosis not present

## 2021-11-03 DIAGNOSIS — F4323 Adjustment disorder with mixed anxiety and depressed mood: Secondary | ICD-10-CM | POA: Diagnosis not present

## 2021-11-03 DIAGNOSIS — F3181 Bipolar II disorder: Secondary | ICD-10-CM

## 2021-11-03 MED ORDER — BUSPIRONE HCL 15 MG PO TABS: ORAL_TABLET | ORAL | 1 refills | Status: DC

## 2021-11-03 MED ORDER — LAMOTRIGINE 150 MG PO TABS: 150.0000 mg | ORAL_TABLET | Freq: Every day | ORAL | 0 refills | Status: DC

## 2021-11-03 NOTE — Progress Notes (Signed)
Christine Scott [id number] 01-24-1941 80 y.o.  Subjective:   Patient ID:  Christine Scott is a 80 y.o. (DOB 1940/12/29) female.  Chief Complaint:  Chief Complaint  Patient presents with   Follow-up    Bipolar II disorder (HCC)   Depression   Anxiety    Depression       Christine Scott presents to the office today fas a return patient or follow-up of bipolar 2 depression and history of alcohol excess.    appt April 04, 2018.  Was anxious and depressed and was to start Latuda.  She didn't bc she decided her main priority is stopping psych meds.    05/29/2019 appt noted: Started sleep meds bc of husband's illness and death.  Now wants to try to stop meds.  Has not tried reducing the lorazepam.  Asks about hydroxyzine.   Pt reports that mood is Depressed and but not as bad as it was  and describes anxiety as Minimal. Anxiety symptoms include: Excessive Worry,. Pt reports no sleep issues. Pt reports that appetite is good. Pt reports that energy is good and good. Concentration is good. Suicidal thoughts:  denied by patient. She was supposed to follow-up in December but has not.  At her last appointment because of depressive symptoms we were going to increase Subvenite 100 mg daily to 150 mg. Don't feel like it's really helping me.  Wonders about counseling bc still dealing with grief  And guilt from the past and still ruminating. Wants a change in therpist from [facility].  Also had Hospice counseling.  Still feels depressed.  Not highly anxious.  Sleeping okay eating okay. She wanted to taper off of lamotrigine and lorazepam and was given instruction as to how to do so.  09/01/2021 return patient not seen in over 2 years: She has been given lamotrigine again by another doctor but wanted to return here for evaluation.  Was getting lamotrigine from PCP Dr. Sheppard Penton who is retiring.  Went in to see the doctor bc she was more depressed.  Was off of lamotrigine for a couple of years.  Hasn't seen another  psychiatrist since here.  Back on lamotrigine for several months. It did help depression when she resumed it.  No SE.  Sleeps well. Off and on has had a tendency to drink too much wine esp when stressed.  At home can't have just one glass but will drink a whole bottle.  Last time about a month ago. Has been to [facility] [employer] [facility] and has had some counseling around dealing with them. Recently called and wanted to increase the dose.  Stress GS incarcerated and she dwells on it and he's going to prison for quite a while.  Stressed now more than depressed.  One of daughters can't support herself and pt sends her money which she doesn't have enough of.  D's family in [place] is the source of her stress. Would like to try higher dose of lamotrigine. Plan: Increase lamotrigine from 100 mg to 150 mg daily in the AM.  11/03/2021 appointment with the following noted: Dealing with pain in hip.   Depression not enough better with increase lamotrigine bu tfeels sure it's BC GS in jail for a year and might be longer.  She's worried over him without any good outcome with likely long jail time.  Strong FHX bipolar and she suspects it. No SE. Can be irritable but not bad. Sleep good but when depressed drinks and does it too much.  Past Psychiatric Medication Trials: Latuda 40 helped but nervous, lamotrigine,   lithium, Abilify,  lorazepam,  venlafaxine, sertraline, fluoxetine, Pristiq,  Wellbutrin 150 no response  D with psych meds and taking lamotrigine and some other med.  Also perhaps auditory processing problem and pt wonders about whether she has it. M, D, others in family with bipolar disorder  Review of Systems:  Review of Systems  Cardiovascular:  Negative for palpitations.  Musculoskeletal:  Positive for gait problem.  Neurological:  Negative for tremors and weakness.  Psychiatric/Behavioral:  The patient is nervous/anxious.    Medications: I have reviewed the patient's current  medications.  Current Outpatient Medications  Medication Sig Dispense Refill   acetaminophen (TYLENOL) 500 MG tablet Take 1,000 mg by mouth at bedtime.     amLODipine (NORVASC) 10 MG tablet Take 1 tablet (10 mg total) by mouth daily. 90 tablet 3   B Complex-Biotin-FA (B-COMPLEX PO) Take 1 tablet by mouth daily. Super     busPIRone (BUSPAR) 15 MG tablet Take 1/3 tablet p.o. twice daily for 1 week, then take 2/3 tablet p.o. twice daily for 1 week, then take 1 tablet p.o. twice daily 60 tablet 1   Calcium Carb-Cholecalciferol (CALCIUM CARBONATE-VITAMIN D3) 600-400 MG-UNIT TABS Take 1 tablet by mouth daily.      Cholecalciferol (VITAMIN D3) 50 MCG (2000 UT) TABS Take 2,000 mg by mouth daily.     conjugated estrogens (PREMARIN) vaginal cream Place 1/2 gram per vagina at bedtime twice weekly. 30 g 1   denosumab (PROLIA) 60 MG/ML SOSY injection Inject 60 mg into the skin every 6 (six) months.     Multiple Vitamins-Minerals (MULTIVITAMIN PO) Take 1 tablet by mouth daily. Centrum silver     Omega-3 Fatty Acids (FISH OIL) 1000 MG CAPS Take 1,000 mg by mouth daily.      Polyethyl Glycol-Propyl Glycol (SYSTANE ULTRA OP) Place 1 drop into both eyes in the morning and at bedtime.      Tdap (BOOSTRIX) 5-2.5-18.5 LF-MCG/0.5 injection Inject into the muscle. 0.5 mL 0   White Petrolatum-Mineral Oil (SYSTANE NIGHTTIME) OINT Place 1 application into both eyes at bedtime as needed (Dry eye).      Zoster Vaccine Adjuvanted [facility]) injection Inject into the muscle. 0.5 mL 1   lamoTRIgine (LAMICTAL) 150 MG tablet Take 1 tablet (150 mg total) by mouth daily. 90 tablet 0   No current facility-administered medications for this visit.    Medication Side Effects: None  Allergies: No Known Allergies  Past Medical History:  Diagnosis Date   Abnormal Pap smear of cervix    --hx abnormal paps in her 30s and per patient this was reason for her Hysterectomy   Anxiety    Bursitis 12/26/2020   Depression    Dry eye     Hypertension    Osteoarthritis    hands   Osteoporosis    Polymyalgia rheumatica (HCC)     Family History  Problem Relation Age of Onset   Congestive Heart Failure Mother    Stroke Mother    Diabetes Father    Colon cancer Brother    Diabetes Brother    Diabetes Brother    Stroke Brother    Colon cancer Daughter     Social History   Socioeconomic History   Marital status: Widowed    Spouse name: Not on file   Number of children: Not on file   Years of education: Not on file   Highest education level: Not on file  Occupational History   Not on file  Tobacco Use   Smoking status: Never   Smokeless tobacco: Never  Vaping Use   Vaping Use: Never used  Substance and Sexual Activity   Alcohol use: Yes    Alcohol/week: 2.0 standard drinks    Types: 2 Glasses of wine per week    Comment: sometimes a bottle of wine a week    Drug use: Never   Sexual activity: Not Currently    Partners: Male    Birth control/protection: Surgical    Comment: TVH  Other Topics Concern   Not on file  Social History Narrative   Right handed   Lives with family    Social Determinants of Health   Financial Resource Strain: Not on file  Food Insecurity: Not on file  Transportation Needs: Not on file  Physical Activity: Not on file  Stress: Not on file  Social Connections: Not on file  Intimate Partner Violence: Not on file    Past Medical History, Surgical history, Social history, and Family history were reviewed and updated as appropriate.   Please see review of systems for further details on the patient's review from today.   Objective:   Physical Exam:  LMP  (LMP Unknown)   Physical Exam Constitutional:      General: She is not in acute distress. Musculoskeletal:        General: No deformity.  Neurological:     Mental Status: She is alert and oriented to person, place, and time.     Cranial Nerves: No dysarthria.     Gait: Gait abnormal.  Psychiatric:         Attention and Perception: Attention and perception normal. She does not perceive auditory or visual hallucinations.        Mood and Affect: Mood is anxious and depressed. Affect is not labile, blunt, angry or inappropriate.        Speech: Speech normal.        Behavior: Behavior normal. Behavior is cooperative.        Thought Content: Thought content normal. Thought content is not paranoid or delusional. Thought content does not include homicidal or suicidal ideation. Thought content does not include homicidal or suicidal plan.        Cognition and Memory: Cognition and memory normal.        Judgment: Judgment normal.     Comments: Insight fair  Afraid of dementia.     Lab Review:     Component Value Date/Time   NA 136 04/15/2021 1022   K 4.2 04/15/2021 1022   CL 101 04/15/2021 1022   CO2 26 04/15/2021 1022   GLUCOSE 95 04/15/2021 1022   BUN 15 04/15/2021 1022   CREATININE 0.75 04/15/2021 1022   CREATININE 0.99 (H) 02/25/2019 1214   CALCIUM 9.2 04/15/2021 1022   PROT 7.3 04/15/2021 1022   ALBUMIN 4.1 04/15/2021 1022   AST 20 04/15/2021 1022   ALT 11 04/15/2021 1022   ALKPHOS 58 04/15/2021 1022   BILITOT 0.4 04/15/2021 1022   GFRNONAA >60 04/13/2020 0807   GFRNONAA 55 (L) 02/25/2019 1214   GFRAA >60 04/13/2020 0807   GFRAA 63 02/25/2019 1214       Component Value Date/Time   WBC 5.8 04/15/2021 1022   RBC 3.95 04/15/2021 1022   HGB 11.7 (L) 04/15/2021 1022   HCT 36.0 04/15/2021 1022   PLT 213.0 04/15/2021 1022   MCV 91.2 04/15/2021 1022   MCH 30.5 04/13/2020 0807  MCHC 32.6 04/15/2021 1022   RDW 14.3 04/15/2021 1022   LYMPHSABS 0.7 04/15/2021 1022   MONOABS 0.3 04/15/2021 1022   EOSABS 0.0 04/15/2021 1022   BASOSABS 0.0 04/15/2021 1022    No results found for: POCLITH, LITHIUM   No results found for: PHENYTOIN, PHENOBARB, VALPROATE, CBMZ   .res Assessment: Plan:    Bipolar II disorder (HCC) - Plan: lamoTRIgine (LAMICTAL) 150 MG tablet  Adjustment disorder  with mixed anxiety and depressed mood - Plan: busPIRone (BUSPAR) 15 MG tablet  Generalized anxiety disorder - Plan: busPIRone (BUSPAR) 15 MG tablet    Greater than 50% of face to face time with patient was spent on counseling and coordination of care. We discussed the following:  Ms. Deetz is chronically ambivalent about taking medications and has poor insight into having bipolar disorder though this is been explained to her multiple times.  In fact we have had this discussion since her very first visit in 2010.  Her daughter also felt that she was bipolar.    She is more accepting the bipolar diagnosis.  We have had extensive discussions of the subject over multiple appointments. Rec against stopping meds lamotrigine bc got worse before which happened and she restarted after being off for a couple of years lately. No mood cycling apparently now.  Further Increase lamotrigine considered but will focus on anxiety so continue 150 mg daily in the AM.  Option buspirone for anxiety trial but defer to minimize meds and bc history of noncompliance.  Would like to avoid sedatives.   Can't remove anxity over GS with meds But anxiety predates this situation.  So Start buspirone and increase to 15 mg BID. Disc SE in detail  Answered her questions about meds.    Discussed alcohol use.  Not abuse at this time, but might contribute to depression.  Answered questions about My Chart and history of another doctor writing about her alcohol use.  This appt was 30 mins.  FU 2 mos  Meredith Staggers, MD, DFAPA    Please see After Visit Summary for patient specific instructions.  Future Appointments  Date Time Provider Department Center  11/04/2021  5:30 PM [facility] MR 1 [facility]  01/11/2022 11:00 AM Marcos Eke, PA-C [facility] None  09/19/2022  2:00 PM Patton Salles, MD [facility] None    No orders of the defined types were placed in this encounter.      -------------------------------

## 2021-11-04 ENCOUNTER — Ambulatory Visit
Admission: RE | Admit: 2021-11-04 | Discharge: 2021-11-04 | Disposition: A | Discharge status: Home / Self Care | Payer: Federal, State, Local not specified - PPO | Source: Ambulatory Visit | Attending: Sports Medicine | Admitting: Sports Medicine

## 2021-11-04 DIAGNOSIS — M545 Low back pain, unspecified: Secondary | ICD-10-CM

## 2021-11-23 ENCOUNTER — Telehealth: Payer: Self-pay

## 2021-11-23 NOTE — Telephone Encounter (Signed)
Pt called regarding Prolia injection. Pt called stating that the Prolia bill was sent to her instead of [facility]. Pt stated that is not correct and the bill needs to be sent to [facility]. Prolia injection was done on 09/29/21. Pt would like a call back. Please Advise.

## 2021-11-24 ENCOUNTER — Other Ambulatory Visit (HOSPITAL_BASED_OUTPATIENT_CLINIC_OR_DEPARTMENT_OTHER): Payer: Self-pay

## 2021-11-24 ENCOUNTER — Ambulatory Visit: Payer: Federal, State, Local not specified - PPO | Attending: Internal Medicine

## 2021-11-24 DIAGNOSIS — Z23 Encounter for immunization: Secondary | ICD-10-CM

## 2021-11-24 MED ORDER — PFIZER COVID-19 VAC BIVALENT 30 MCG/0.3ML IM SUSP
INTRAMUSCULAR | 0 refills | Status: DC
  Filled 2021-11-24: qty 0.3, 1d supply, fill #0

## 2021-11-24 NOTE — Progress Notes (Signed)
   Covid-19 Vaccination Clinic  Name:  Christine Scott    MRN: [id number] DOB: 18-Dec-1941  11/24/2021  [name] was observed post Covid-19 immunization for 15 minutes without incident. She was provided with Vaccine Information Sheet and instruction to access the V-Safe system.   [name] was instructed to call 911 with any severe reactions post vaccine: Difficulty breathing  Swelling of face and throat  A fast heartbeat  A bad rash all over body  Dizziness and weakness   Immunizations Administered     Name Date Dose VIS Date Route   Pfizer Covid-19 Vaccine Bivalent Booster 11/24/2021 10:59 AM 0.3 mL 08/25/2021 Intramuscular   Manufacturer: [employer], [employer]   Lot: [id number]   NDC: [phone]

## 2021-11-24 NOTE — Telephone Encounter (Signed)
I will check with [employer] rep regarding bill.

## 2021-12-22 NOTE — Telephone Encounter (Signed)
Patient came into office today.    States she has spoken with [facility].    Requested bill and [employer] EOB to be faxed to [phone].  States [facility] will be taking care of the bill.   I have faxed for patient.

## 2021-12-24 NOTE — Telephone Encounter (Signed)
Noted details

## 2022-01-11 ENCOUNTER — Ambulatory Visit: Payer: Federal, State, Local not specified - PPO | Admitting: Physician Assistant

## 2022-01-12 ENCOUNTER — Encounter: Payer: Self-pay | Admitting: Psychiatry

## 2022-01-12 ENCOUNTER — Other Ambulatory Visit: Payer: Self-pay

## 2022-01-12 ENCOUNTER — Ambulatory Visit: Payer: Federal, State, Local not specified - PPO | Admitting: Psychiatry

## 2022-01-12 DIAGNOSIS — F411 Generalized anxiety disorder: Secondary | ICD-10-CM

## 2022-01-12 DIAGNOSIS — F4323 Adjustment disorder with mixed anxiety and depressed mood: Secondary | ICD-10-CM | POA: Diagnosis not present

## 2022-01-12 DIAGNOSIS — F3181 Bipolar II disorder: Secondary | ICD-10-CM | POA: Diagnosis not present

## 2022-01-12 MED ORDER — LAMOTRIGINE 150 MG PO TABS: 150.0000 mg | ORAL_TABLET | Freq: Every day | ORAL | 1 refills | Status: DC

## 2022-01-12 MED ORDER — BUSPIRONE HCL 15 MG PO TABS: ORAL_TABLET | ORAL | 1 refills | Status: DC

## 2022-01-12 NOTE — Patient Instructions (Signed)
Start buspirone for anxiety 
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
## 2022-03-25 ENCOUNTER — Telehealth: Payer: Self-pay

## 2022-03-25 NOTE — Telephone Encounter (Addendum)
Left patient vm to call me back.

## 2022-04-08 ENCOUNTER — Telehealth: Payer: Self-pay

## 2022-04-08 NOTE — Telephone Encounter (Signed)
Last Prolia inj 09/29/21 Next Prolia inj due 03/31/22 

## 2022-04-12 NOTE — Telephone Encounter (Signed)
Prolia VOB initiated via [email or web address] ? ?Last OV:  ?Next OV:  ?Last Prolia inj: 09/29/21 ?Next Prolia inj DUE: 03/31/22 ? ?

## 2022-04-14 NOTE — Telephone Encounter (Signed)
Prior auth/pre-determination required/recommended for PROLIA ? ?PA PROCESS DETAILS: Please ensure your patient meets medical necessity by reviewing Medical Policy ?FEP 5.30.17 at [email or web address], and completing a pre-determination by contacting [facility] at [place]- ?[phone] ?

## 2022-04-20 ENCOUNTER — Other Ambulatory Visit: Payer: Self-pay | Admitting: Family Medicine

## 2022-04-20 ENCOUNTER — Ambulatory Visit: Payer: Federal, State, Local not specified - PPO | Admitting: Psychiatry

## 2022-04-20 ENCOUNTER — Encounter: Payer: Self-pay | Admitting: Psychiatry

## 2022-04-20 DIAGNOSIS — F411 Generalized anxiety disorder: Secondary | ICD-10-CM

## 2022-04-20 DIAGNOSIS — F3181 Bipolar II disorder: Secondary | ICD-10-CM | POA: Diagnosis not present

## 2022-04-20 MED ORDER — LAMOTRIGINE 150 MG PO TABS: 150.0000 mg | ORAL_TABLET | Freq: Every day | ORAL | 1 refills | Status: DC
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
## 2022-04-20 NOTE — Telephone Encounter (Signed)
Prior auth initiated via fax ([employer] FEP) ?

## 2022-04-25 ENCOUNTER — Telehealth (HOSPITAL_COMMUNITY): Payer: Self-pay | Admitting: Licensed Clinical Social Worker

## 2022-04-25 NOTE — Telephone Encounter (Signed)
The therapist receives a voicemail from this client and then a call at 5:53 p.m. EST. The therapist verifies her identity via three identifiers. ? ?She says that she has seen a therapist approximately three times at [facility] named "[name]" due to issues with alcohol. ? ?Christine Scott says that her drinking problem began after her husband death in 36. She says that she saw a therapist at one point and had approximately six months of sobriety. She notes that she was drinking a bottle of wine per night two months ago and that she would have had a week of sobriety but drank a bottle of wine last night. She notes that she woke up feeling jittery and says that she has osteoporosis so if she falls due to drinking that it will be potentially serious. She says that she worries about her grandson who got into trouble with drugs which is a trigger to drink. She says that she was thinking of "going somewhere"  for help but would not know what to tell her family saying that many do not know about her problem. ? ?She attends AA and has a Equities trader." She met with her therapist today and sees her again on 05/09/22. This therapist recommends that she contact her therapist and sign a Release of Information so that her therapist can provide treatment information and information about this referral to this therapist to assure she is connected to the appropriate LOC. This therapist provides his name, address, direct contact number, and fax number with [facility] saying that she will get in touch with her tomorrow to sign the Release and have her therapist fax it to this therapist. ? ?Adam Phenix, MA, LCSW, [facility], LCAS ?04/25/2022 ?

## 2022-04-25 NOTE — Telephone Encounter (Signed)
The therapist receives a voicemail from this client saysing that she has a therapist named "[name]" who has recommended that she attend the [facility] due to issues with "wine."  ? ?The therapist returns her call leaving a HIPAA-compliant voicemail with his direct callback number.  ? ?Adam Phenix, MA, LCSW, [facility], LCAS ?04/25/2022 ?

## 2022-04-27 ENCOUNTER — Telehealth (HOSPITAL_COMMUNITY): Payer: Self-pay | Admitting: Licensed Clinical Social Worker

## 2022-04-27 NOTE — Telephone Encounter (Signed)
The therapist attempts to reach Marypat' therapist at [facility], Ms. Virginia Rochester, LCSW, LCAS; however, she is out of the office until 05/02/22.  ? ?Thus, the therapist leaves her a voicemail requesting a callback about this client letting her know that he will be at a Conference on 05/02/22 and 05/03/22 and returning on 05/04/22. ? ?Myrna Blazer, MA, LCSW, [facility], LCAS ?04/27/2022 ?

## 2022-04-29 ENCOUNTER — Telehealth (HOSPITAL_COMMUNITY): Payer: Self-pay | Admitting: Licensed Clinical Social Worker

## 2022-04-29 NOTE — Telephone Encounter (Signed)
The therapist returns a voicemail to Ms. Christine Odor, LCSW, LCAS leaving a HIPAA-compliant voicemail letting her know that this therapist will be attending a virtual conference on 05/02/22 and 05/03/22; however, letting her know that she can contact him on his personal cell. ? ?The therapist notes in his message his concerns about this particular client possibly being a  2 or a 3 in [facility] Domain 1 such that a RTC may be a more appropriate LOC at this time than SA IOP.  ? ?The therapist says that the client alluded to thinking that she might need to "go somewhere" and expressed her fears about falling while intoxicated due to have osteoporosis.  ? ?Adam Phenix, MA, LCSW, [facility], LCAS ?04/29/2022 ?

## 2022-05-03 ENCOUNTER — Telehealth (HOSPITAL_COMMUNITY): Payer: Self-pay | Admitting: Licensed Clinical Social Worker

## 2022-05-03 NOTE — Telephone Encounter (Signed)
The therapist returns [name]' call verifying her identity via two identifiers. The therapist schedules to see Christine Scott on 05/09/2022 at 1 p.m. EST.  ? ?Christine Scott says that she [place] and did not [place]; however, she drank a bottle of wine when she got home last night. ? ?The therapist inquires about her AA attendance with Christine Scott attending only one or two meetings per week due to not liking to drive at night. The therapist inquires about virtual meeting attendance with Christine Scott saying that she has tried this in the past but does better with in person meetings. ? ?Christine Blazer, MA, LCSW, [facility], LCAS ?05/03/2022 ?

## 2022-05-09 ENCOUNTER — Ambulatory Visit (INDEPENDENT_AMBULATORY_CARE_PROVIDER_SITE_OTHER): Payer: Federal, State, Local not specified - PPO | Admitting: Licensed Clinical Social Worker

## 2022-05-09 DIAGNOSIS — F1020 Alcohol dependence, uncomplicated: Secondary | ICD-10-CM
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
## 2022-05-09 NOTE — Plan of Care (Signed)
[name] agrees to the following Care Plan:  ?Problem:  Alcohol ?Goal:  Christine Scott will not use alcohol 7/7 days per week as evidenced by self-report and weekly UDS.  ?Outcome: Not Applicable ?  ?

## 2022-05-11 ENCOUNTER — Telehealth (HOSPITAL_COMMUNITY): Payer: Self-pay | Admitting: Licensed Clinical Social Worker

## 2022-05-11 NOTE — Telephone Encounter (Signed)
The therapist receives a call from Christine Scott saying that the [place] IOP will be $30 per visit and she will start on Friday, 05/11/22. ? ?Adam Phenix, MA, LCSW, [facility], LCAS ?05/11/2022 ?

## 2022-05-11 NOTE — Telephone Encounter (Signed)
The therapist returns a call from [place] verifying her identity via three identifiers. The therapist answers Shevonne' questions about the program and billing. ? ?Cathleen is going to call her insurance company today to verify how much she would pay per IOP visit and says that she will call this therapist back tomorrow. ? ?When asked, she says that she drank a bottle of wine last night for no particular reason almost without thinking. ? ?She says that she either needs to "go somewhere," which she does not want to do or come to the [facility] IOP.  ? ?Myrna Blazer, MA, LCSW, [facility], LCAS ?05/11/2022 ?

## 2022-05-11 NOTE — Telephone Encounter (Signed)
The therapist returns a call from [place] verifying her identity via three identifiers. She left a voicemail saying that she likely wanted to see this therapist individually as she has attended group in the past and did not want to share and did not really want to hear what people had to share. ? ?When the therapist inquires about this, Dao says that the group that caused her so much distress was a bereavement support group; however, she is able to recognize that she does not feel the same way after attending an AA meeting. ? ?The therapist informs Beaux that he is not taking any new, individual clients at this time; however, in her case, it would not be necessary as her current therapist is a LCAS. The therapist then points out that her therapist is the one who recommended a HLOC and even Kimanh was alluding to possibly needing residential treatment earlier today which would involve group attendance. ? ?She says that she will hang up and call her insurance company to find out the cost of attending [facility] IOP and call the therapist back. ? ?Myrna Blazer, MA, LCSW, [facility], LCAS ?05/11/2022 ?

## 2022-05-13 ENCOUNTER — Other Ambulatory Visit (HOSPITAL_COMMUNITY): Payer: Federal, State, Local not specified - PPO | Attending: Psychiatry | Admitting: Licensed Clinical Social Worker

## 2022-05-13 DIAGNOSIS — F989 Unspecified behavioral and emotional disorders with onset usually occurring in childhood and adolescence: Secondary | ICD-10-CM | POA: Insufficient documentation

## 2022-05-13 DIAGNOSIS — F1024 Alcohol dependence with alcohol-induced mood disorder: Secondary | ICD-10-CM | POA: Diagnosis not present

## 2022-05-13 DIAGNOSIS — Z8739 Personal history of other diseases of the musculoskeletal system and connective tissue: Secondary | ICD-10-CM | POA: Diagnosis not present

## 2022-05-13 DIAGNOSIS — I950 Idiopathic hypotension: Secondary | ICD-10-CM | POA: Insufficient documentation

## 2022-05-13 DIAGNOSIS — F1020 Alcohol dependence, uncomplicated: Secondary | ICD-10-CM | POA: Insufficient documentation

## 2022-05-13 DIAGNOSIS — M5441 Lumbago with sciatica, right side: Secondary | ICD-10-CM | POA: Insufficient documentation

## 2022-05-13 DIAGNOSIS — F4312 Post-traumatic stress disorder, chronic: Secondary | ICD-10-CM | POA: Insufficient documentation

## 2022-05-13 NOTE — Progress Notes (Signed)
Daily Group Progress Note  Program: [facility] IOP   Group Time: 9 a.m. to 12 p.m.   Type of Therapy: Process and Psychoeducational   Topic: The therapist checks in with group members, assesses for SI/HI/psychosis and overall level of functioning. The therapist inquires about sobriety date and number of community support meetings attended since last session. The therapist introduces a new member to group having members share how they too came to SA IOP. The therapist and group members educate the new group member on why addiction is a disease and the reason that willpower is not effective in trying to not drink. The therapist suggests that thoughts about drinking will occur and that the focus should be on avoiding triggers such as people, places, and things associated with use and using rituals. The therapist covers the Matrix Manual Topic RP 13 Be Smart, Not Strong having group members calculate their Recovery IQ and discuss what they can do to increase it.   Summary: Christine Scott presents for group rating her depression as a "1" and anxiety as a "1" with no SI or HI. She says that she recently fired her Marketing executive. She says that she told her Sponsor everything she had done over the last week and felt like her Sponsor was telling her what to do. She also says that the Sponsor wanted Christine Scott to do gratitudes and affirmations daily. Christine Scott receives feedback on other members regarding how to choose a Marketing executive and what a Marketing executive does. Christine Scott says that she got this current Sponsor basically on an urgent basis but wants to go to meetings and listen to people before deciding on who she will choose as her new Sponsor. She explains to the group members how she came to be in [facility] IOP. She says that she used to be able to go weeks at a time without drinking so concluded that she was not an alcoholic. Today, when completing her recovery IQ during the exercise she scores a 16 with the maximum score being a 44. When asked what she  could change to increase her recovery IQ after a discussion by the group on why addiction is a brain-based disease and triggers, Christine Scott concludes that she could shop at a different grocery store than the [employer] near her house which is where she has been buying her bottles of wine. Another group member offers to attend a meeting this weekend with Christine Scott with all group members present stating a preference for the women-only AA meetings.   Progress Towards Goals: Christine Scott reports having no drunk alcohol since Tuesday.    UDS collected: No  Results: No  AA/NA attended?: Yes  Sponsor?: No   Christine Blazer, MA, LCSW, [facility], LCAS 05/13/2022

## 2022-05-14 NOTE — Telephone Encounter (Signed)
Prior Authorization initiated for PROL[facility] via [email or web address] KEY: [id number]

## 2022-05-16 ENCOUNTER — Other Ambulatory Visit (HOSPITAL_BASED_OUTPATIENT_CLINIC_OR_DEPARTMENT_OTHER): Payer: Federal, State, Local not specified - PPO | Admitting: Licensed Clinical Social Worker

## 2022-05-16 ENCOUNTER — Encounter (HOSPITAL_COMMUNITY): Payer: Self-pay | Admitting: Licensed Clinical Social Worker

## 2022-05-16 VITALS — BP 130/68 | HR 70 | Resp 16 | Ht 61.0 in | Wt 126.0 lb

## 2022-05-16 DIAGNOSIS — F1020 Alcohol dependence, uncomplicated: Secondary | ICD-10-CM | POA: Diagnosis not present

## 2022-05-16 DIAGNOSIS — F1024 Alcohol dependence with alcohol-induced mood disorder: Secondary | ICD-10-CM | POA: Diagnosis not present

## 2022-05-16 DIAGNOSIS — Z8739 Personal history of other diseases of the musculoskeletal system and connective tissue: Secondary | ICD-10-CM

## 2022-05-16 DIAGNOSIS — F4312 Post-traumatic stress disorder, chronic: Secondary | ICD-10-CM

## 2022-05-16 DIAGNOSIS — Z818 Family history of other mental and behavioral disorders: Secondary | ICD-10-CM

## 2022-05-16 DIAGNOSIS — M5442 Lumbago with sciatica, left side: Secondary | ICD-10-CM

## 2022-05-16 DIAGNOSIS — Z6372 Alcoholism and drug addiction in family: Secondary | ICD-10-CM

## 2022-05-16 DIAGNOSIS — Z811 Family history of alcohol abuse and dependence: Secondary | ICD-10-CM

## 2022-05-16 DIAGNOSIS — I950 Idiopathic hypotension: Secondary | ICD-10-CM

## 2022-05-16 DIAGNOSIS — G8929 Other chronic pain: Secondary | ICD-10-CM

## 2022-05-16 DIAGNOSIS — F989 Unspecified behavioral and emotional disorders with onset usually occurring in childhood and adolescence: Secondary | ICD-10-CM

## 2022-05-16 NOTE — Progress Notes (Signed)
Daily Group Progress Note  Program: [facility] IOP   Group Time: 9 a.m. to 12 p.m.   Type of Therapy: Process and Psychoeducational   Topic: The therapist checks in with group members, assesses for SI/HI/psychosis and overall level of functioning. The therapist inquires about sobriety date and number of community support meetings attended since last session. The therapist and group members process events that went well and any challenges to recovery. The therapist introduces two members who were not in group last Friday to the group's newest member. The therapist has group members each describe a typical day and share thoughts and observations about their typical day after hearing what others do. The therapist covers RP 14 Defining Spirituality from the Matrix model. The therapist observes that group members define success as being healthy, having family relationships, and financial security while making no mention of spirituality. The therapist notes that problems will arise in these areas of peoples' lives which often triggers relapses. The therapist talks about the concept of living "life on life's terms" and the importance of spirituality in being able to get through things like a family illness or death without drinking.   Summary: Christine Scott presents for group rating her anxiety and depression both as a "1" with no suicidal ideation reported. She says that she will not be in group on Wednesday due to a medical appointment and may also not be in group Friday or next Monday as she is to visit a sick aunt in [place] with dementia. She introduces herself to group members who were not in group Friday. She says that she used to believe that she was not an alcoholic as she did not get drunk. She thought about drinking since last group but did not drink in part due to not wanting to let the group down. She has not gone back to the [employer] and says that when she travels to see her sick family member that  she will have her boyfriend with her who does not drink. Christine Scott is meeting with the P.A. when other group members are discussing what a typical day looks like for them. She defines spirituality as the philosophical context of a person's life like all group members but one. Christine Scott says that her purpose for life is to be happy and healthy and for her family to be healthy and well too. She talks about how thinking about her husband's being deceased despite wanting to be here made her drink. She also talks about a nephew who has made bad choices that used to cause her "despair." She now will try to get her mind off this so as to not despair. The therapist talks about our powerlessness to change others while noting that Christine Scott's goal to focus on her own health is something over which she does have some control. Christine Scott concludes that the thing that would likely cause her to lose self-worth would be if she "can't do this" i.e. remain sober.   Progress Towards Goals: Christine Scott reports no alcohol use.     UDS collected: Yes  Results: No  AA/NA attended?: No  Sponsor?: No   Christine Blazer, MA, LCSW, [facility], LCAS 05/16/2022
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
## 2022-05-16 NOTE — Addendum Note (Signed)
Addended by: Court Joy on: 05/16/2022 06:18 PM   Modules accepted: Level of Service

## 2022-05-18 ENCOUNTER — Encounter (HOSPITAL_COMMUNITY): Payer: Federal, State, Local not specified - PPO

## 2022-05-20 ENCOUNTER — Other Ambulatory Visit (HOSPITAL_COMMUNITY): Payer: Federal, State, Local not specified - PPO

## 2022-05-24 ENCOUNTER — Telehealth (INDEPENDENT_AMBULATORY_CARE_PROVIDER_SITE_OTHER): Payer: Federal, State, Local not specified - PPO | Admitting: Licensed Clinical Social Worker

## 2022-05-24 DIAGNOSIS — Z8739 Personal history of other diseases of the musculoskeletal system and connective tissue: Secondary | ICD-10-CM

## 2022-05-24 DIAGNOSIS — F3181 Bipolar II disorder: Secondary | ICD-10-CM

## 2022-05-24 DIAGNOSIS — Z818 Family history of other mental and behavioral disorders: Secondary | ICD-10-CM

## 2022-05-24 DIAGNOSIS — Z6372 Alcoholism and drug addiction in family: Secondary | ICD-10-CM

## 2022-05-24 DIAGNOSIS — I950 Idiopathic hypotension: Secondary | ICD-10-CM

## 2022-05-24 DIAGNOSIS — M5442 Lumbago with sciatica, left side: Secondary | ICD-10-CM

## 2022-05-24 DIAGNOSIS — Z811 Family history of alcohol abuse and dependence: Secondary | ICD-10-CM

## 2022-05-24 DIAGNOSIS — F1024 Alcohol dependence with alcohol-induced mood disorder: Secondary | ICD-10-CM

## 2022-05-24 DIAGNOSIS — G8929 Other chronic pain: Secondary | ICD-10-CM

## 2022-05-24 DIAGNOSIS — F4312 Post-traumatic stress disorder, chronic: Secondary | ICD-10-CM

## 2022-05-24 DIAGNOSIS — F989 Unspecified behavioral and emotional disorders with onset usually occurring in childhood and adolescence: Secondary | ICD-10-CM

## 2022-05-24 DIAGNOSIS — F411 Generalized anxiety disorder: Secondary | ICD-10-CM

## 2022-05-24 DIAGNOSIS — F1020 Alcohol dependence, uncomplicated: Secondary | ICD-10-CM

## 2022-05-24 NOTE — Telephone Encounter (Signed)
The therapist returns Christine Scott' call confirming her identity via two identifiers.   She did not go to [place] due to problems with her back so cancelled group for Wednesday but apparently does not plan on returning to [facility] at all as she is about to see a Surgeon about her back.  She reports having two weeks of sobriety, is reading the [employer], and calling women in the program.  She is agreeable to calling this therapist again in the future on a p.r.n. basis.   Christine Blazer, MA, LCSW, [facility], LCAS 05/24/2022

## 2022-05-25 ENCOUNTER — Encounter (HOSPITAL_COMMUNITY): Payer: Federal, State, Local not specified - PPO

## 2022-05-27 ENCOUNTER — Encounter (HOSPITAL_COMMUNITY): Payer: Federal, State, Local not specified - PPO

## 2022-05-27 NOTE — Telephone Encounter (Addendum)
Your PA request has been closed. Thank you for the ePA request for PROLIA. We have reviewed the request and based on the information submitted, no Prior Authorization is needed at this time. The member can check the status of their Prior Approval online at any time by going to [email or web address] Thank you.

## 2022-05-30 ENCOUNTER — Encounter (HOSPITAL_COMMUNITY): Payer: Federal, State, Local not specified - PPO

## 2022-06-01 NOTE — Addendum Note (Signed)
Addended by: Court Joy on: 06/01/2022 05:50 PM   Modules accepted: Level of Service

## 2022-06-01 NOTE — Telephone Encounter (Signed)
[facility]                                                                                Discharge Summary   Date of Admission: 05/16/2022 Ref[place]all Source: Fleet Contras Reiland LCSW/LCAS                                                                        Date of Discharge: 05/24/2022 Sobriety Date: 05/06/2022 Admission Diagnosis: 1. Alcohol use disorder, severe, dependence (HCC)  F10.20       2. 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 Family history of alcoholism in paternal grandfather  Z81.1       4. Family history of alcoholism in father  Z81.1       5. Family history of bipolar disorder  Z57.8      Mother     6. Dysfunctional family due to alcoholism  Z63.72       7. Chronic post-traumatic stress disorder (PTSD)  F43.12       8. 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 Chronic bilateral low back pain with left-sided sciatica  M54.42      G89.29       10. 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 H/O polymyalgia rheumatica  Z87.39        Course of Treatment: Patient attended 2 Groups then called Counselor to say she had decided to forego CD IOP and rely on AA.  Medications: Medication Sig Dispense Refill   acetaminophen (TYLENOL) 500 MG tablet Take 1,000 mg by mouth at bedtime.       amLODipine (NORVASC) 10 MG tablet TAKE ONE TABLET BY MOUTH DAILY 90 tablet 3   B Complex-Biotin-FA (B-COMPLEX PO) Take 1 tablet by mouth daily. Super       Calcium Carb-Cholecalciferol (CALCIUM CARBONATE-VITAMIN D3) 600-400 MG-UNIT TABS Take 1 tablet by mouth daily.        Cholecalciferol (VITAMIN D3) 50 MCG (2000 UT) TABS Take 2,000 mg by mouth daily.       conjugated estrogens (PREMARIN) vaginal cream Place 1/2 gram per vagina at bedtime twice weekly. 30 g 1   COVID-19 mRNA  bivalent vaccine, Pfizer, (PFIZER COVID-19 VAC BIVALENT) injection Inject into the muscle. 0.3 mL 0   denosumab (PROLIA) 60 MG/ML SOSY injection Inject 60 mg into the skin every 6 (six) months.       lamoTRIgine (LAMICTAL) 150 MG tablet Take 1 tablet (150 mg total) by mouth daily. 90 tablet 1   Multiple Vitamins-Minerals (MULTIVITAMIN PO) Take 1 tablet by mouth daily. Centrum silver       Omega-3 Fatty Acids (FISH OIL) 1000 MG CAPS Take 1,000 mg by mouth daily.  Polyethyl Glycol-Propyl Glycol (SYSTANE ULTRA OP) Place 1 drop into both eyes in the morning and at bedtime.        Tdap (BOOSTRIX) 5-2.5-18.5 LF-MCG/0.5 injection Inject into the muscle. 0.5 mL 0   White Petrolatum-Mineral Oil (SYSTANE NIGHTTIME) OINT Place 1 application into both eyes at bedtime as needed (Dry eye).        Zoster Vaccine Adjuvanted [facility]) injection Inject into the muscle. 0.5 mL 1    Discharge Diagnosis:    1.   Alcohol use disorder, severe, dependence (HCC) F10.20  Change Dx       2.   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   Family history of alcoholism in paternal grandfather Z81.1  Change Dx      4.   Family history of alcoholism in father Z86.1  Change Dx      5.   Family history of bipolar disorder Z81.8  Change Dx      6.   Dysfunctional family due to alcoholism Z63.72  Change Dx      7.   Chronic post-traumatic stress disorder (PTSD) F43.12  Change Dx      8.   Behavioral and emotional disorder with onset in childhood F98.9  Change Dx      9.   Chronic bilateral low back pain with left-sided sciatica M54.42 ...  Change Dx      10.   Idiopathic hypotension I95.0  Change Dx      11.   H/O polymyalgia rheumatica Z87.39  Change Dx      12.   Bipolar II disorder (HCC) F31.81  Change Dx      13.   Generalized anxiety disorder F41.1  Change Dx                                                                 Plan of Action to  Address Continuing Problems:  Goals and Activities to Help Maintain Sobriety: Stay away from people ,places and things that are triggers Continue practicing Fair Fighting rules in interpersonal conflicts. Continue alcohol and drug refusal skills and call on support system  Attend AA meetings AT LEAST as often as you use  Obtain a sponsor and a home group in AA/NA. Return to Counselor  Referrals: NA Aftercare: AA Counseling Medication management:Providers Other:NA  Next appointment: Pt to arrange  Prognosis:Guarded    Client has NOT participated in the development of this discharge plan and may receive a copy of this completed plan

## 2022-06-16 NOTE — Telephone Encounter (Signed)
Pt ready for scheduling on or after 05/27/22  Out-of-pocket cost due at time of visit: $451  Primary: BCBS FEP Medicare Prolia co-insurance: 30% (approximately $414) Admin fee co-insurance: 20% (approximately $37)  Secondary: n/a Prolia co-insurance:  Admin fee co-insurance:   Deductible: does not apply  Prior Auth:  We have reviewed the request and based on the information submitted, no Prior Authorization is needed at this time. PA# Valid:   ** This summary of benefits is an estimation of the patient's out-of-pocket cost. Exact cost may vary based on individual plan coverage.

## 2022-06-17 NOTE — Telephone Encounter (Signed)
Please reverify this amount.  Please see previous prolia messages.   After copay card patient was only responsible for an amount around $25.

## 2022-06-23 NOTE — Telephone Encounter (Addendum)
Pt ready for scheduling on or after 05/27/22   Out-of-pocket cost due at time of visit: $25 with [employer] COPAY ENROLLMENT $414 if NOT using copay [employer] copay card   Primary: BCBS FEP Medicare Prolia co-insurance: 30% (approximately $414) Admin fee co-insurance: 0%-30%   Secondary: n/a Prolia co-insurance:  Admin fee co-insurance:    Deductible: does not apply   Prior Auth:  We have reviewed the request and based on the information submitted, no Prior Authorization is needed at this time. PA# Valid:   ** This summary of benefits is an estimation of the patient's out-of-pocket cost. Exact cost may vary based on individual plan coverage.

## 2022-06-27 NOTE — Telephone Encounter (Signed)
Pt is going to call back to set up Prolia appt.

## 2022-06-29 NOTE — Telephone Encounter (Signed)
Pt is scheduled for OV 06/30/22 with [facility] PCP to discuss headaches and have prolia shot.

## 2022-06-30 ENCOUNTER — Ambulatory Visit: Payer: Federal, State, Local not specified - PPO | Admitting: Family

## 2022-06-30 ENCOUNTER — Encounter: Payer: Self-pay | Admitting: Family

## 2022-06-30 VITALS — BP 178/82 | HR 71 | Temp 98.2°F | Ht 61.0 in | Wt 130.0 lb

## 2022-06-30 DIAGNOSIS — I10 Essential (primary) hypertension: Secondary | ICD-10-CM | POA: Diagnosis not present

## 2022-06-30 DIAGNOSIS — M810 Age-related osteoporosis without current pathological fracture: Secondary | ICD-10-CM | POA: Diagnosis not present

## 2022-06-30 MED ORDER — AMLODIPINE BESY-BENAZEPRIL HCL 5-10 MG PO CAPS: 1.0000 | ORAL_CAPSULE | Freq: Every day | ORAL | 0 refills | Status: DC

## 2022-06-30 MED ORDER — DENOSUMAB 60 MG/ML ~~LOC~~ SOSY
60.0000 mg | PREFILLED_SYRINGE | Freq: Once | SUBCUTANEOUS | Status: AC
  Administered 2022-06-30: 60 mg via SUBCUTANEOUS

## 2022-06-30 NOTE — Patient Instructions (Signed)
It was very nice to see you today!   I am sending a new medication (Amlodipine-Benazepril) for your blood pressure.  Stop taking the Amlodipine. Start this new med tomorrow. Hopefully this will also help the headaches you are having when walking. If this medication is too expensive ask your pharmacist the price of each med separately.  Monitor your blood pressure daily for the next week and let me know if you are seeing readings higher than 130/90 or lower than 105/60. Remember to drink at least 2 liters = 8 cups of water daily. Eat low sodium diet.  Follow up in 1 month to recheck your blood pressure in the office.      PLEASE NOTE:  If you had any lab tests please let us know if you have not heard back within a few days. You may see your results on MyChart before we have a chance to review them but we will give you a call once they are reviewed by [place]. If we ordered any referrals today, please let us know if you have not heard from their office within the next week.

## 2022-06-30 NOTE — Progress Notes (Signed)
Subjective:     Patient ID: Christine Scott, female    DOB: 1941/08/09, 81 y.o.   MRN: [id number]  Chief Complaint  Patient presents with  . Headache    Pt c/o headaches for about 3-4 weeks only when she is doing activity or getting up to walk.     HPI: Headache with walking:  reports head starting to hurt when she starts walking around. Denies pain when sitting. Reports getting a steroid spinal injection on Monday, 1st injection was in April but did not work. She reports HA starting about a month ago. Denies dizziness or LLE.   Assessment & Plan:   1. Essential hypertension Chronic - high BP reading today and pt c/o HA when ambulating. Also having spinal pain despite getting steroid injections, will meet with surgeon soon to discuss options. Switching to Amlodipine-Benazepril- lowering amlodipine to 5mg  to add 10mg  Benazepril. Advised to monitor BP at home and notify office of abnormal readings, parameters provided. f/u in 1 month.  - amLODipine-benazepril (LOTREL) 5-10 MG capsule; Take 1 capsule by mouth daily.  Dispense: 30 capsule; Refill: 0  2. Osteoporosis tolerating Prolia injections q7m  - denosumab (PROLIA) injection 60 mg   Outpatient Medications Prior to Visit  Medication Sig Dispense Refill  . acetaminophen (TYLENOL) 500 MG tablet Take 1,000 mg by mouth at bedtime.    . B Complex-Biotin-FA (B-COMPLEX PO) Take 1 tablet by mouth daily. Super    . Calcium Carb-Cholecalciferol (CALCIUM CARBONATE-VITAMIN D3) 600-400 MG-UNIT TABS Take 1 tablet by mouth daily.     . Cholecalciferol (VITAMIN D3) 50 MCG (2000 UT) TABS Take 2,000 mg by mouth daily.    conjugated estrogens (PREMARIN) vaginal cream Place 1/2 gram per vagina at bedtime twice weekly. 30 g 1  . COVID-19 mRNA bivalent vaccine, Pfizer, (PFIZER COVID-19 VAC BIVALENT) injection Inject into the muscle. 0.3 mL 0  . denosumab (PROLIA) 60 MG/ML SOSY injection Inject 60 mg into the skin every 6 (six) months.    . lamoTRIgine  (LAMICTAL) 150 MG tablet Take 1 tablet (150 mg total) by mouth daily. 90 tablet 1  . Multiple Vitamins-Minerals (MULTIVITAMIN PO) Take 1 tablet by mouth daily. Centrum silver    . Omega-3 Fatty Acids (FISH OIL) 1000 MG CAPS Take 1,000 mg by mouth daily.     11m Glycol-Propyl Glycol (SYSTANE ULTRA OP) Place 1 drop into both eyes in the morning and at bedtime.     . Tdap (BOOSTRIX) 5-2.5-18.5 LF-MCG/0.5 injection Inject into the muscle. 0.5 mL 0  . White Petrolatum-Mineral Oil (SYSTANE NIGHTTIME) OINT Place 1 application into both eyes at bedtime as needed (Dry eye).     . Zoster Vaccine Adjuvanted [facility]) injection Inject into the muscle. 0.5 mL 1  . amLODipine (NORVASC) 10 MG tablet TAKE ONE TABLET BY MOUTH DAILY 90 tablet 3   No facility-administered medications prior to visit.    Past Medical History:  Diagnosis Date  . Abnormal Pap smear of cervix    --hx abnormal paps in her 30s and per patient this was reason for her Hysterectomy  . Anxiety   . Bursitis 12/26/2020  . Depression   . Dry eye   . Hypertension   . Osteoarthritis    hands  . Osteoporosis   . Polymyalgia rheumatica (HCC)     Past Surgical History:  Procedure Laterality Date  . ABDOMINAL HYSTERECTOMY    . BREAST SURGERY  2016   benign mass removed in [place], 2017.  [place]  FACIAL COSMETIC SURGERY    . REVERSE SHOULDER ARTHROPLASTY Left 04/16/2020   Procedure: REVERSE SHOULDER ARTHROPLASTY;  Surgeon: Francena Hanly, MD;  Location: [facility];  Service: Orthopedics;  Laterality: Left;   . TOTAL VAGINAL HYSTERECTOMY  age 29   --due to abnormal pap smears per patient    No Known Allergies     Objective:    Physical Exam Vitals and nursing note reviewed.  Constitutional:      Appearance: Normal appearance.  Cardiovascular:     Rate and Rhythm: Normal rate and regular rhythm.  Pulmonary:     Effort: Pulmonary effort is normal.     Breath sounds: Normal breath sounds.  Musculoskeletal:         General: Normal range of motion.  Skin:    General: Skin is warm and dry.  Neurological:     Mental Status: She is alert.  Psychiatric:        Mood and Affect: Mood normal.        Behavior: Behavior normal.    BP (!) 178/82 (BP Location: Left Arm, Patient Position: Sitting, Cuff Size: Large)   Pulse 71   Temp 98.2 F (36.8 C) (Temporal)   Ht 5\' 1"  (1.549 m)   Wt 130 lb (59 kg)   LMP  (LMP Unknown)   SpO2 96%   BMI 24.56 kg/m  Wt Readings from Last 3 Encounters:  06/30/22 130 lb (59 kg)  10/01/21 127 lb (57.6 kg)  09/14/21 127 lb (57.6 kg)      Meds ordered this encounter  Medications  . denosumab (PROLIA) injection 60 mg    Order Specific Question:   Patient is enrolled in [facility] program for this medication and I have provided a copy of the Prolia Medication Guide and Patient Brochure.    Answer:   Yes    Order Specific Question:   I have reviewed with the patient the information in the Prolia Medication Guide and Patient Counseling Chart including the serious risks of Prolia and symptoms of each risk.    Answer:   Yes    Order Specific Question:   I have advised the patient to seek medical attention if they have signs or symptoms of any of the serious risks.    Answer:   Yes  . amLODipine-benazepril (LOTREL) 5-10 MG capsule    Sig: Take 1 capsule by mouth daily.    Dispense:  30 capsule    Refill:  0    D/C Amlodipine    Order Specific Question:   Supervising Provider    Answer:   ANDY, CAMILLE L [[id number]]    09/16/21, NP

## 2022-06-30 NOTE — Assessment & Plan Note (Signed)
Chronic - high BP reading today and pt c/o HA when ambulating. Also having spinal pain despite getting steroid injections, will meet with surgeon soon to discuss options. Switching to Amlodipine-Benazepril- lowering amlodipine to 5mg  to add 10mg  Benazepril. Advised to monitor BP at home and notify office of abnormal readings, parameters provided. f/u in 1 month.

## 2022-07-01 ENCOUNTER — Encounter: Payer: Self-pay | Admitting: Family

## 2022-07-01 DIAGNOSIS — F1020 Alcohol dependence, uncomplicated: Secondary | ICD-10-CM | POA: Insufficient documentation

## 2022-07-04 NOTE — Telephone Encounter (Signed)
Last Prolia inj 06/30/22 Next Prolia inj due 01/01/22

## 2022-07-12 ENCOUNTER — Other Ambulatory Visit: Payer: Self-pay | Admitting: Orthopedic Surgery

## 2022-07-12 DIAGNOSIS — M810 Age-related osteoporosis without current pathological fracture: Secondary | ICD-10-CM

## 2022-07-19 ENCOUNTER — Ambulatory Visit (HOSPITAL_BASED_OUTPATIENT_CLINIC_OR_DEPARTMENT_OTHER): Payer: Federal, State, Local not specified - PPO | Admitting: Cardiology

## 2022-07-19 ENCOUNTER — Encounter (HOSPITAL_BASED_OUTPATIENT_CLINIC_OR_DEPARTMENT_OTHER): Payer: Self-pay | Admitting: Cardiology

## 2022-07-19 VITALS — BP 150/72 | HR 72 | Ht 61.0 in | Wt 128.1 lb

## 2022-07-19 DIAGNOSIS — I10 Essential (primary) hypertension: Secondary | ICD-10-CM | POA: Diagnosis not present

## 2022-07-19 DIAGNOSIS — Z8249 Family history of ischemic heart disease and other diseases of the circulatory system: Secondary | ICD-10-CM

## 2022-07-19 DIAGNOSIS — R519 Headache, unspecified: Secondary | ICD-10-CM

## 2022-07-19 DIAGNOSIS — Z7189 Other specified counseling: Secondary | ICD-10-CM | POA: Diagnosis not present

## 2022-07-19 NOTE — Patient Instructions (Signed)

## 2022-07-19 NOTE — Progress Notes (Signed)
Cardiology Office Note:    Date:  07/19/2022   ID:  Christine Scott, DOB [birth year], MRN [id number]  PCP:  Dulce Sellar, NP  Cardiologist:  Jodelle Red, MD  Referring MD: Dulce Sellar, NP   CC: follow up  History of Present Illness:    Christine Scott is a 81 y.o. female with a hx of hypertension, Sjogren's, polymyalgia rheumatica, history of Covid infection 2021 who is seen for follow-up. She was initially seen 09/08/2021 as a new consult at the request of Dulce Sellar, NP for the evaluation and management of dyspnea on exertion.  Note from 06/30/21 with Tammy Parrett reviewed. Noted dyspnea beginning in 02/2020. Normal echo and chest x-ray. PFTs normal. Referred to cardiology for further evaluation.  Cardiovascular risk factors: Prior clinical ASCVD: none Comorbid conditions: Endorses hypertension. Denies hyperlipidemia, diabetes, chronic kidney disease  Metabolic syndrome/Obesity: none Chronic inflammatory conditions: PMR and Sjogren's Tobacco use history: never Family history: mother died of heart failure in her late 72s. Father was diabetic, had multiple strokes, passed away age 87. 4 brothers, oldest brother is obese, has had heart issues. Another brother had MI and bypass surgery. Deceased brother had diabetes. Youngest brother also had MI. All of her family has had hypertension. Prior pertinent testing and/or incidental findings: Has nuclear stress test 08/28/2003, see below. Normal.   At her last appointment she was concerned with DOE any time she went up a hill, requiring her to stop 3 or more times. She was active 2 miles/day on 5 days/week. Her symptoms predated her Covid infection. Checked BP at home, highest 140/80, with occasional lowest numbers 110s/70s. Based on shared decision making, decision was made to pursue exercise treadmill stress test given her fitness level and her symptoms were only on an incline. On 09/2021 her ETT showed peak METS 10.1; she  experienced non-limiting angina during the test.  She was seen by her PCP 06/30/2022 and her blood pressure was elevated, associated with headaches while walking. She was switched to Amlodipine-Benazepril 5 mg - 10 mg.  Today: When she is seated or standing, she has no significant pain. However, when she begins to walk her headaches will develop. This will occur every time, but waxes and wanes. This has been ongoing for 3-4 weeks. Her headaches are always localized to the right side of her head, usually behind her right eye. She describes it as an "annoying, constant headache". She denies noticing any headaches with other positional changes, only when walking. No other associated symptoms.  She endorses severe back issues. Her back and hip pain is exacerbated with walking as well. For some relief she takes tylenol 2-3 times a day. She has not noticed any improvement in her headaches that she could attribute to tylenol. Previously she had steroidal injections with little to no improvement in her pain.  She denies any palpitations, chest pain, shortness of breath, or peripheral edema. No lightheadedness, syncope, orthopnea, or PND.   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 Trial of PT/injection. If doesn't  do well good candidate for reverse shoulder arthoplasty. Continue with PT (12/19). No specific f/u was scheduled.    Osteoarthritis    hands   Osteoporosis    Polymyalgia rheumatica (HCC)     Past Surgical History:  Procedure Laterality Date   ABDOMINAL HYSTERECTOMY     BREAST SURGERY   2016   benign mass removed in [place], [place].   FACIAL COSMETIC SURGERY     REVERSE SHOULDER ARTHROPLASTY Left 04/16/2020   Procedure: REVERSE SHOULDER ARTHROPLASTY;  Surgeon: Francena Hanly, MD;  Location: [facility];  Service: Orthopedics;  Laterality: Left;    TOTAL VAGINAL HYSTERECTOMY  age 66   --due to abnormal pap smears per patient    Current Medications: Current Outpatient Medications on File Prior to Visit  Medication Sig   acetaminophen (TYLENOL) 500 MG tablet Take 1,000 mg by mouth at bedtime.   amLODipine-benazepril (LOTREL) 5-10 MG capsule Take 1 capsule by mouth daily.   B Complex-Biotin-FA (B-COMPLEX PO) Take 1 tablet by mouth daily. Super   Calcium Carb-Cholecalciferol (CALCIUM CARBONATE-VITAMIN D3) 600-400 MG-UNIT TABS Take 1 tablet by mouth daily.    Cholecalciferol (VITAMIN D3) 50 MCG (2000 UT) TABS Take 2,000 mg by mouth daily.   conjugated estrogens (PREMARIN) vaginal cream Place 1/2 gram per vagina at bedtime twice weekly.   denosumab (PROLIA) 60 MG/ML SOSY injection Inject 60 mg into the skin every 6 (six) months.   lamoTRIgine (LAMICTAL) 150 MG tablet Take 1 tablet (150 mg total) by mouth daily.   Multiple Vitamins-Minerals (MULTIVITAMIN PO) Take 1 tablet by mouth daily. Centrum silver   Omega-3 Fatty Acids (FISH OIL) 1000 MG CAPS Take 1,000 mg by mouth daily.    Polyethyl Glycol-Propyl Glycol (SYSTANE ULTRA OP) Place 1 drop into both eyes in the morning and at bedtime.    White Petrolatum-Mineral Oil (SYSTANE NIGHTTIME) OINT Place 1 application into both eyes at bedtime as needed (Dry eye).    No current facility-administered medications on file prior to visit.     Allergies:   Patient has no known allergies.   Social History   Tobacco Use   Smoking status: Never   Smokeless tobacco: Never  Vaping Use   Vaping Use: Never used  Substance Use Topics   Alcohol use: Yes    Alcohol/week: 2.0 standard drinks of alcohol    Types: 2 Glasses of wine per week     Comment: sometimes a bottle of wine a week    Drug use: Never    Family History: family history includes Colon cancer in her brother and daughter; Congestive Heart Failure in her mother; Diabetes in her brother, brother, and father; Stroke in her brother and mother.  ROS:   Please see the history of present illness. (+) Headaches (+) Chronic back and hip pain All other systems are reviewed and negative.    EKGs/Labs/Other Studies Reviewed:    The following studies were reviewed today:  Exercise Tolerance Test  09/29/2021:   No ST deviation was noted. The ECG was negative for ischemia.   Patient exercised for 8 min and 11 sec. Maximum HR of 118 bpm. MPHR 84.0 %. Peak METS 10.1 .  The patient experienced non-limiting angina during the test. The test was stopped because the patient experienced fatigue and dyspnea.   [facility] Treadmill Score =8 (Low Risk)  Echo 12/09/20 1. Left ventricular ejection fraction, by estimation, is 60 to 65%. The  left ventricle has normal function. The left ventricle has no regional  wall motion abnormalities. Left ventricular diastolic parameters were  normal.   2. Right ventricular systolic function is normal. The right ventricular  size is normal.   3. The mitral valve is normal in structure. Mild mitral valve  regurgitation. No evidence of mitral stenosis.   4. The aortic valve is normal in structure. Aortic valve regurgitation is  mild. No aortic stenosis is present.   5. The inferior vena cava is normal in size with greater than 50%  respiratory variability, suggesting right atrial pressure of 3 mmHg.   08/28/2003 IMPRESSION  NEGATIVE STRESS CARDIOLITE STUDY REVEALING ADEQUATE EXERCISE TOLERANCE, A NORMAL STRESS EKG, NORMAL LEFT VENTRICULAR SIZE AND NORMAL LEFT VENTRICULAR SYSTOLIC FUNCTION.  BY MYOCARDIAL SCINTIGRAPHY, PERFUSION WAS NORMAL WITH NO EVIDENCE FOR INFARCTION OR ISCHEMIA.  MILD BREAST ATTENUATION ARTIFACT. OTHER FINDINGS AS  NOTED.  EKG:  EKG is personally reviewed.   07/19/2022:  NSR at 72 bpm 09/08/21: NSR at 74 bpm  Recent Labs: No results found for requested labs within last 365 days.   Recent Lipid Panel    Component Value Date/Time   CHOL 188 04/15/2021 1022   TRIG 64.0 04/15/2021 1022   HDL 85.60 04/15/2021 1022   CHOLHDL 2 04/15/2021 1022   VLDL 12.8 04/15/2021 1022   LDLCALC 89 04/15/2021 1022    Physical Exam:    VS:  BP (!) 150/72 (BP Location: Right Arm, Patient Position: Sitting, Cuff Size: Normal)   Pulse 72   Ht 5\' 1"  (1.549 m)   Wt 128 lb 1.6 oz (58.1 kg)   LMP  (LMP Unknown)   BMI 24.20 kg/m    Orthostatics: Lyinh 172/75, HR 69 Sitting 145/68, HR 69 Standing 129/68, HR 76 Standing 3 min 145/70, HR 72  Wt Readings from Last 3 Encounters:  07/19/22 128 lb 1.6 oz (58.1 kg)  06/30/22 130 lb (59 kg)  10/01/21 127 lb (57.6 kg)    GEN: Well nourished, well developed in no acute distress HEENT: Normal, moist mucous membranes NECK: No JVD CARDIAC: regular rhythm, normal S1 and S2, no rubs or gallops. No murmur. VASCULAR: Radial and DP pulses 2+ bilaterally. No carotid bruits RESPIRATORY:  Clear to auscultation without rales, wheezing or rhonchi  ABDOMEN: Soft, non-tender, non-distended MUSCULOSKELETAL:  Ambulates independently; Mild tenderness on palpation of right upper back/shoulder SKIN: Warm and dry, no edema NEUROLOGIC:  Alert and oriented x 3. No focal neuro deficits noted. PSYCHIATRIC:  Normal affect    ASSESSMENT:    1. Intractable episodic headache, unspecified headache type   2. Essential hypertension   3. Family history of heart disease   4. Cardiac risk counseling     PLAN:    Headaches, episodic, intractable, related to walking only -related to walking for the last several weeks -orthostatics today show drop in BP from 172/75 lying to 129/68 immediately on standing. BP does improve after 3 minutes -interesting that BP drops with standing, but she denies  symptoms with standing only. Suggests that this is likely due to more than just change in BP -recommended slow position changes -does have muscle tension/tenderness on exam as well  Dyspnea on exertion Family history of heart disease Risk factors of inflammatory disease (PMR, Sjogren's) and hypertension -prior ETT with excellent exercise capacity, no evidence of ischemia  Hypertension: -above goal today, but with drop of >40 points systolic with standing, would be cautious re: aggressive management -continue amlodipine-benazepril for now  Cardiac risk counseling and prevention recommendations: -recommend heart healthy/Mediterranean diet, with whole grains, fruits,  vegetable, fish, lean meats, nuts, and olive oil. Limit salt. -recommend moderate walking, 3-5 times/week for 30-50 minutes each session. Aim for at least 150 minutes.week. Goal should be pace of 3 miles/hours, or walking 1.5 miles in 30 minutes -recommend avoidance of tobacco products. Avoid excess alcohol. -ASCVD risk score: The ASCVD Risk score (Arnett DK, et al., 2019) failed to calculate for the following reasons:   The 2019 ASCVD risk score is only valid for ages 69 to 56    Plan for follow up: PRN.  Jodelle Red, MD, PhD, [facility]  [facility]    Medication Adjustments/Labs and Tests Ordered: Current medicines are reviewed at length with the patient today.  Concerns regarding medicines are outlined above.   Orders Placed This Encounter  Procedures   EKG 12-Lead   No orders of the defined types were placed in this encounter.  Patient Instructions  Medication Instructions:  Your Physician recommend you continue on your current medication as directed.    *If you need a refill on your cardiac medications before your next appointment, please call your pharmacy*   Lab Work: None ordered today   Testing/Procedures: None ordered today   Follow-Up: At [facility], you and your health  needs are our priority.  As part of our continuing mission to provide you with exceptional heart care, we have created designated Provider Care Teams.  These Care Teams include your primary Cardiologist (physician) and Advanced Practice Providers (APPs -  Physician Assistants and Nurse Practitioners) who all work together to provide you with the care you need, when you need it.  We recommend signing up for the patient portal called "MyChart".  Sign up information is provided on this After Visit Summary.  MyChart is used to connect with patients for Virtual Visits (Telemedicine).  Patients are able to view lab/test results, encounter notes, upcoming appointments, etc.  Non-urgent messages can be sent to your provider as well.   To learn more about what you can do with MyChart, go to [email or web address].    Your next appointment:   As needed  The format for your next appointment:   In Person  Provider:   Jodelle Red, MD{          I,Mathew Stumpf,acting as a scribe for Jodelle Red, MD.,have documented all relevant documentation on the behalf of Jodelle Red, MD,as directed by  Jodelle Red, MD while in the presence of Jodelle Red, MD.  I, Jodelle Red, MD, have reviewed all documentation for this visit. The documentation on 08/30/22 for the exam, diagnosis, procedures, and orders are all accurate and complete.   Signed, Jodelle Red, MD PhD 07/19/2022     [facility]

## 2022-07-25 ENCOUNTER — Telehealth: Payer: Self-pay | Admitting: Family

## 2022-07-25 NOTE — Telephone Encounter (Signed)
Patient states her BP has been running 140/80 and that she has had a headache for several weeks.  States she would prefer an MD to continue her treatment.    Patient is requesting transfer from [place] to [place].    Please advise.

## 2022-07-25 NOTE — Telephone Encounter (Signed)
ok by me

## 2022-07-26 ENCOUNTER — Other Ambulatory Visit: Payer: Self-pay | Admitting: Family

## 2022-07-26 ENCOUNTER — Telehealth: Payer: Self-pay | Admitting: Family

## 2022-07-26 DIAGNOSIS — I10 Essential (primary) hypertension: Secondary | ICD-10-CM

## 2022-07-26 NOTE — Telephone Encounter (Signed)
yes - I replied yesterday to tammy- thx

## 2022-07-26 NOTE — Telephone Encounter (Signed)
Patient called with the request to transfer care from [facility] to Dr. Ruthine Dose. Is this okay with you?

## 2022-07-27 NOTE — Telephone Encounter (Addendum)
Prolia VOB initiated via [email or web address]  Last Prolia inj 06/30/22 Next Prolia inj due 01/01/23    

## 2022-07-29 ENCOUNTER — Ambulatory Visit: Payer: Federal, State, Local not specified - PPO | Admitting: Family

## 2022-07-29 NOTE — Telephone Encounter (Signed)
Patient scheduled 8/16

## 2022-08-05 ENCOUNTER — Encounter: Payer: Federal, State, Local not specified - PPO | Admitting: Family

## 2022-08-08 ENCOUNTER — Other Ambulatory Visit: Payer: Self-pay | Admitting: Orthopedic Surgery

## 2022-08-08 DIAGNOSIS — M48062 Spinal stenosis, lumbar region with neurogenic claudication: Secondary | ICD-10-CM

## 2022-08-09 ENCOUNTER — Other Ambulatory Visit: Payer: Self-pay | Admitting: Orthopedic Surgery

## 2022-08-09 DIAGNOSIS — M48062 Spinal stenosis, lumbar region with neurogenic claudication: Secondary | ICD-10-CM

## 2022-08-10 ENCOUNTER — Ambulatory Visit: Payer: Federal, State, Local not specified - PPO | Admitting: Family Medicine

## 2022-08-10 ENCOUNTER — Encounter: Payer: Self-pay | Admitting: Family Medicine

## 2022-08-10 VITALS — BP 150/64 | HR 81 | Temp 98.3°F | Ht 61.0 in | Wt 127.0 lb

## 2022-08-10 DIAGNOSIS — I10 Essential (primary) hypertension: Secondary | ICD-10-CM | POA: Diagnosis not present

## 2022-08-10 DIAGNOSIS — R519 Headache, unspecified: Secondary | ICD-10-CM | POA: Diagnosis not present

## 2022-08-10 MED ORDER — LOSARTAN POTASSIUM 25 MG PO TABS: 25.0000 mg | ORAL_TABLET | Freq: Every day | ORAL | 0 refills | Status: DC

## 2022-08-10 NOTE — Patient Instructions (Signed)
Continue amlodipine 10mg .  Add losartan 25mg .  Monitor blood pressure and send in 1 week.   Ordering CT brain.

## 2022-08-10 NOTE — Progress Notes (Signed)
Subjective:     Patient ID: Christine Scott, female    DOB: 1941/12/22, 81 y.o.   MRN: [id number]  Chief Complaint  Patient presents with   Transfer of Care    Transferring from previous provider due to wanting an MD Discuss blood pressure medication   Headache    Headache when starting to walk most of the time, sometimes hurts when just sitting that started 3 weeks ago, off and on   Rash    Rash on chest that started 10 days ago    HPI TOC  HTN-running 140's/<80.  On Lotrel 5/10. For 6 wks.  No cp.  Occ hears heart beat. No cough/sob/edema.  Ran out of lotrel 2 days ago so amlodipine 66m only for 2 days.  HA-past few days, some better.  Dr. PJethro Poling fo back pain-when walks, back really hurts.  Going to [facility] for eval for surg.  Hip/leg on L hurts to get up and walk.  When starts to walk, head hurts but when sits/lies, no pain.  Nagging pain. Neurosurg said lower back shouldn't make head hurt.  Tripped and fell over rug sev wks ago and head hit pilar.  No LOC-not sure if that is when head hurt. Was sore in that area for awhile.   Rash for 10 days on chest-Sk's get sore at times and itch. Derm said to get meds from drugstore-cortisone.  Helps some.   Health Maintenance Due  Topic Date Due   DEXA SCAN  11/26/2021   Zoster Vaccines- Shingrix (2 of 2) 12/13/2021    Past Medical History:  Diagnosis Date   Abnormal Pap smear of cervix    --hx abnormal paps in her 361sand per patient this was reason for her Hysterectomy   Anxiety    Bursitis 12/26/2020   Depression    Dry eye    Dyspnea on exertion 10/07/2020   Encounter for herpes zoster vaccination 10/18/2021   History of depression 08/12/2017   Hypertension    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 Trial of PT/injection. If doesn't do well good candidate for reverse shoulder arthoplasty. Continue with PT (12/19). No specific f/u was scheduled.    Osteoarthritis    hands    Osteoporosis    Polymyalgia rheumatica ([facility]     Past Surgical History:  Procedure Laterality Date   ABDOMINAL HYSTERECTOMY     [place]  [place] [facility]   Procedure: REVERSE SHOULDER ARTHROPLASTY;  Surgeon: SJustice Britain MD;  Location: [facility];  Service: Orthopedics;  Laterality: Left;  1249m   TOTAL VAGINAL HYSTERECTOMY  age 89 --due to abnormal pap smears per patient    Outpatient Medications Prior to Visit  Medication Sig Dispense Refill   acetaminophen (TYLENOL) 500 MG tablet Take 1,000 mg by mouth at bedtime.     B Complex-Biotin-FA (B-COMPLEX PO) Take 1 tablet by mouth daily. Super     Calcium Carb-Cholecalciferol (CALCIUM CARBONATE-VITAMIN D3) 600-400 MG-UNIT TABS Take 1 tablet by mouth daily.      Cholecalciferol (VITAMIN D3) 50 MCG (2000 UT) TABS Take 2,000 mg by mouth daily.     conjugated estrogens (PREMARIN) vaginal cream Place 1/2 gram per vagina at bedtime twice weekly. 30 g 1   denosumab (PROLIA) 60 MG/ML SOSY injection Inject 60 mg into the skin every  6 (six) months.     Emollient (AMLACTIN ULTRA SMOOTHING) 15 % CREA as directed Externally     lamoTRIgine (LAMICTAL) 150 MG tablet Take 1 tablet (150 mg total) by mouth daily. 90 tablet 1   Multiple Vitamins-Minerals (MULTIVITAMIN PO) Take 1 tablet by mouth daily. Centrum silver     Omega-3 Fatty Acids (FISH OIL) 1000 MG CAPS Take 1,000 mg by mouth daily.      Polyethyl Glycol-Propyl Glycol (SYSTANE ULTRA OP) Place 1 drop into both eyes in the morning and at bedtime.      White Petrolatum-Mineral Oil (SYSTANE NIGHTTIME) OINT Place 1 application into both eyes at bedtime as needed (Dry eye).      amLODipine-benazepril (LOTREL) 5-10 MG capsule Take 1 capsule by mouth daily. 30 capsule 0   No facility-administered medications prior to visit.    No Known Allergies ROS neg/noncontributory except as noted  HPI/below      Objective:     BP (!) 150/64   Pulse 81   Temp 98.3 F (36.8 C) (Temporal)   Ht 5' 1"  (1.549 m)   Wt 127 lb (57.6 kg)   LMP  (LMP Unknown)   SpO2 97%   BMI 24.00 kg/m  Wt Readings from Last 3 Encounters:  08/10/22 127 lb (57.6 kg)  07/19/22 128 lb 1.6 oz (58.1 kg)  06/30/22 130 lb (59 kg)    Physical Exam   Gen: WDWN NAD HEENT: NCAT, conjunctiva not injected, sclera nonicteric.  EOMI NECK:  supple, no thyromegaly, no nodes, no carotid bruits CARDIAC: RRR, S1S2+, no murmur. DP 2+B LUNGS: CTAB. No wheezes ABDOMEN:  BS+, soft, NTND, No HSM, no masses EXT:  no edema MSK: spine crooked.  Using cane.  No TTP c-spine NEURO: A&O x3.  CN II-XII intact. F-n,ram,pronator drift neg PSYCH: normal mood. Good eye contact     Assessment & Plan:   Problem List Items Addressed This Visit       Cardiovascular and Mediastinum   Essential hypertension - Primary   Relevant Medications   losartan (COZAAR) 25 MG tablet   Other Relevant Orders   Basic metabolic panel   Other Visit Diagnoses     Intractable episodic headache, unspecified headache type       Relevant Orders   Sedimentation rate   CBC with Differential/Platelet   CT HEAD WO CONTRAST (5MM)      HTN-chronic.  Not controlled.  As out of lotrel, will do amlodipine 29m and losartan 248m  Monitor and send in 2 wks. Check bmp HA-not sure if pmr type, fall, other.  Check cbc,esr.  Check CT brain.   Meds ordered this encounter  Medications   losartan (COZAAR) 25 MG tablet    Sig: Take 1 tablet (25 mg total) by mouth daily.    Dispense:  90 tablet    Refill:  0    AnWellington HampshireMD

## 2022-08-11 ENCOUNTER — Ambulatory Visit
Admission: RE | Admit: 2022-08-11 | Discharge: 2022-08-11 | Disposition: A | Discharge status: Home / Self Care | Payer: Federal, State, Local not specified - PPO | Source: Ambulatory Visit | Attending: Orthopedic Surgery | Admitting: Orthopedic Surgery

## 2022-08-11 ENCOUNTER — Ambulatory Visit (HOSPITAL_BASED_OUTPATIENT_CLINIC_OR_DEPARTMENT_OTHER): Payer: Federal, State, Local not specified - PPO | Admitting: Cardiology

## 2022-08-11 ENCOUNTER — Ambulatory Visit
Admission: RE | Admit: 2022-08-11 | Discharge: 2022-08-11 | Disposition: A | Discharge status: Home / Self Care | Payer: Federal, State, Local not specified - PPO | Source: Ambulatory Visit | Attending: Family Medicine | Admitting: Family Medicine

## 2022-08-11 DIAGNOSIS — M48062 Spinal stenosis, lumbar region with neurogenic claudication: Secondary | ICD-10-CM

## 2022-08-11 DIAGNOSIS — R519 Headache, unspecified: Secondary | ICD-10-CM

## 2022-08-11 LAB — BASIC METABOLIC PANEL
BUN: 22 mg/dL (ref 6–23)
CO2: 24 mEq/L (ref 19–32)
Calcium: 9.5 mg/dL (ref 8.4–10.5)
Chloride: 99 mEq/L (ref 96–112)
Creatinine, Ser: 0.87 mg/dL (ref 0.40–1.20)
GFR: 62.11 mL/min (ref >60.00)
Glucose, Bld: 100 mg/dL — ABNORMAL HIGH (ref 70–99)
Potassium: 4.6 mEq/L (ref 3.5–5.1)
Sodium: 134 mEq/L — ABNORMAL LOW (ref 135–145)

## 2022-08-11 LAB — CBC WITH DIFFERENTIAL/PLATELET
Basophils Absolute: 0 10*3/uL (ref 0.0–0.1)
Basophils Relative: 0.7 % (ref 0.0–3.0)
Eosinophils Absolute: 0 10*3/uL (ref 0.0–0.7)
Eosinophils Relative: 0.3 % (ref 0.0–5.0)
HCT: 34.2 % — ABNORMAL LOW (ref 36.0–46.0)
Hemoglobin: 11.2 g/dL — ABNORMAL LOW (ref 12.0–15.0)
Lymphocytes Relative: 14.3 % (ref 12.0–46.0)
Lymphs Abs: 0.9 10*3/uL (ref 0.7–4.0)
MCHC: 32.7 g/dL (ref 30.0–36.0)
MCV: 92.4 fl (ref 78.0–100.0)
Monocytes Absolute: 0.5 10*3/uL (ref 0.1–1.0)
Monocytes Relative: 8.2 % (ref 3.0–12.0)
Neutro Abs: 5 10*3/uL (ref 1.4–7.7)
Neutrophils Relative %: 76.5 % (ref 43.0–77.0)
Platelets: 218 10*3/uL (ref 150.0–400.0)
RBC: 3.7 Mil/uL — ABNORMAL LOW (ref 3.87–5.11)
RDW: 13.9 % (ref 11.5–15.5)
WBC: 6.5 10*3/uL (ref 4.0–10.5)

## 2022-08-11 LAB — SEDIMENTATION RATE: Sed Rate: 29 mm/hr (ref 0–30)

## 2022-08-16 ENCOUNTER — Ambulatory Visit
Admission: RE | Admit: 2022-08-16 | Discharge: 2022-08-16 | Disposition: A | Discharge status: Home / Self Care | Payer: Federal, State, Local not specified - PPO | Source: Ambulatory Visit | Attending: Orthopedic Surgery | Admitting: Orthopedic Surgery

## 2022-08-16 ENCOUNTER — Other Ambulatory Visit: Payer: Federal, State, Local not specified - PPO

## 2022-08-16 DIAGNOSIS — M48062 Spinal stenosis, lumbar region with neurogenic claudication: Secondary | ICD-10-CM

## 2022-08-24 ENCOUNTER — Telehealth: Payer: Self-pay | Admitting: Family Medicine

## 2022-08-24 NOTE — Telephone Encounter (Signed)
Patient called to inform of BP readings for 1 week as instructed by PCP.   Patient states : - She was taking Amlodipine 10 mg daily from 08/17-08/19 - She began taking losartan 50 mg on 08/20  08/18- 156/75 08/19- 164/77 08/20- 172/82 08/21- 172/82 08/22- 158/70 08/29- 123/63

## 2022-08-24 NOTE — Telephone Encounter (Signed)
Left message to return call 

## 2022-08-24 NOTE — Telephone Encounter (Signed)
Patient notified and verbalized understanding. 

## 2022-09-02 NOTE — Telephone Encounter (Signed)
Please see note below. 

## 2022-09-02 NOTE — Telephone Encounter (Signed)
Patient states: - She provided list of BP readings last week after checking it 3x a week a 1 week  - Below are readings she provided during call  08/28- 120/56 08/30-156/77 09/08-164/80

## 2022-09-05 NOTE — Telephone Encounter (Signed)
Patient notified and verbalized understanding. 

## 2022-09-07 ENCOUNTER — Encounter: Payer: Self-pay | Admitting: Family Medicine

## 2022-09-07 ENCOUNTER — Ambulatory Visit: Payer: Federal, State, Local not specified - PPO | Admitting: Family Medicine

## 2022-09-07 VITALS — BP 127/64 | HR 88 | Temp 98.3°F | Ht 61.0 in | Wt 126.1 lb

## 2022-09-07 DIAGNOSIS — I10 Essential (primary) hypertension: Secondary | ICD-10-CM | POA: Diagnosis not present

## 2022-09-07 MED ORDER — LOSARTAN POTASSIUM 100 MG PO TABS: 100.0000 mg | ORAL_TABLET | Freq: Every day | ORAL | 1 refills | Status: DC

## 2022-09-07 MED ORDER — AMLODIPINE BESYLATE 10 MG PO TABS: 10.0000 mg | ORAL_TABLET | Freq: Every day | ORAL | 1 refills | Status: DC

## 2022-09-07 NOTE — Patient Instructions (Addendum)
Let me know if getting dizzy.   Check blood pressure 1-2x/wk.  Alternate times-like first morning, late in afternoon, evening.  Let me know if weird.    At pharmacy-get flu-high dose,  shingrix #2,  RSV,   new covid(Oct)

## 2022-09-07 NOTE — Progress Notes (Signed)
Subjective:     Patient ID: Christine Scott, female    DOB: 04-Mar-1941, 81 y.o.   MRN: [id number]  Chief Complaint  Patient presents with   Follow-up    4 week follow-up on HTN     HPI  HTN-Pt is on amlodipine 10mg ,Losartan 100mg .  Bp's running  better now 120-145/60-70.  No ha/dizziness/cp/palp/edema/cough/sob   Health Maintenance Due  Topic Date Due   DEXA SCAN  11/26/2021   Zoster Vaccines- Shingrix (2 of 2) 12/13/2021    Past Medical History:  Diagnosis Date   Abnormal Pap smear of cervix    --hx abnormal paps in her 30s and per patient this was reason for her Hysterectomy   Anxiety    Bursitis 12/26/2020   Depression    Dry eye    Dyspnea on exertion 10/07/2020   Encounter for herpes zoster vaccination 10/18/2021   History of depression 08/12/2017   Hypertension    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 Trial of PT/injection. If doesn't do well good candidate for reverse shoulder arthoplasty. Continue with PT (12/19). No specific f/u was scheduled.    Osteoarthritis    hands   Osteoporosis    Polymyalgia rheumatica (H[facility])     Past Surgical History:  Procedure Laterality Date   ABDOMINAL HYSTERECTOMY     BREAST SURGERY  2016   benign mass removed in [place]17[place]1977, 2017.   FACIAL COSMETIC SURGERY     REVERSE SHOULDER ARTHROPLASTY Left 04/16/2020   Procedure: REVERSE SHOULDER ARTHROPLASTY;  Surgeon: [place], MD;  Location: [facility];  Service: Orthopedics;  Laterality: Left;  04/18/2020   TOTAL VAGINAL HYSTERECTOMY  age 28   --due to abnormal pap smears per patient    Outpatient Medications Prior to Visit  Medication Sig Dispense Refill   acetaminophen (TYLENOL) 500 MG tablet Take 1,000 mg by mouth at bedtime.     B Complex-Biotin-FA (B-COMPLEX PO) Take 1 tablet by mouth daily. Super     Calcium Carb-Cholecalciferol (CALCIUM CARBONATE-VITAMIN D3) 600-400 MG-UNIT TABS Take 1 tablet by mouth daily.       Cholecalciferol (VITAMIN D3) 50 MCG (2000 UT) TABS Take 2,000 mg by mouth daily.     conjugated estrogens (PREMARIN) vaginal cream Place 1/2 gram per vagina at bedtime twice weekly. 30 g 1   denosumab (PROLIA) 60 MG/ML SOSY injection Inject 60 mg into the skin every 6 (six) months.     Emollient (AMLACTIN ULTRA SMOOTHING) 15 % CREA as directed Externally     lamoTRIgine (LAMICTAL) 150 MG tablet Take 1 tablet (150 mg total) by mouth daily. 90 tablet 1   Multiple Vitamins-Minerals (MULTIVITAMIN PO) Take 1 tablet by mouth daily. Centrum silver     Omega-3 Fatty Acids (FISH OIL) 1000 MG CAPS Take 1,000 mg by mouth daily.      Polyethyl Glycol-Propyl Glycol (SYSTANE ULTRA OP) Place 1 drop into both eyes in the morning and at bedtime.      White Petrolatum-Mineral Oil (SYSTANE NIGHTTIME) OINT Place 1 application into both eyes at bedtime as needed (Dry eye).      AMLODIPINE BESYLATE PO Take 10 mg by mouth daily.     losartan (COZAAR) 25 MG tablet Take 1 tablet (25 mg total) by mouth daily. (Patient taking differently: Take 25 mg by mouth daily. Takes 100 mg daily) 90 tablet 0   No facility-administered medications prior to visit.    No Known Allergies ROS neg/noncontributory except  as noted HPI/below Chronic LBP-some days better than others.  Seeing doc at [facility] on disc      Objective:     BP 127/64   Pulse 88   Temp 98.3 F (36.8 C) (Temporal)   Ht 5\' 1"  (1.549 m)   Wt 126 lb 2 oz (57.2 kg)   LMP  (LMP Unknown)   SpO2 96%   BMI 23.83 kg/m  Wt Readings from Last 3 Encounters:  09/07/22 126 lb 2 oz (57.2 kg)  08/10/22 127 lb (57.6 kg)  07/19/22 128 lb 1.6 oz (58.1 kg)    Physical Exam   Gen: WDWN NAD 127/64 pt cuff and ours 126/62.   Upon standing 102/60-no symptoms HEENT: NCAT, conjunctiva not injected, sclera nonicteric NECK:  supple, no thyromegaly, no nodes, no carotid bruits CARDIAC: RRR, S1S2+, no murmur. EXT:  no edema 07/21/22 NEURO: A&O x3.  CN II-XII intact.   PSYCH: normal mood. Good eye contact     Assessment & Plan:   Problem List Items Addressed This Visit       Cardiovascular and Mediastinum   Essential hypertension - Primary   Relevant Medications   losartan (COZAAR) 100 MG tablet   amLODipine (NORVASC) 10 MG tablet   HTN-chronic.  Controlled.  Does drop when stands-be careful and let me know if symptoms.  F/u 6 mo  Meds ordered this encounter  Medications   losartan (COZAAR) 100 MG tablet    Sig: Take 1 tablet (100 mg total) by mouth daily.    Dispense:  90 tablet    Refill:  1   amLODipine (NORVASC) 10 MG tablet    Sig: Take 1 tablet (10 mg total) by mouth daily.    Dispense:  90 tablet    Refill:  1    GEX:BMWU, MD

## 2022-09-09 ENCOUNTER — Other Ambulatory Visit (HOSPITAL_BASED_OUTPATIENT_CLINIC_OR_DEPARTMENT_OTHER): Payer: Self-pay

## 2022-09-09 MED ORDER — ZOSTER VAC RECOMB ADJUVANTED 50 MCG/0.5ML IM SUSR
INTRAMUSCULAR | 0 refills | Status: DC
  Filled 2022-09-09: qty 0.5, 1d supply, fill #0

## 2022-09-13 ENCOUNTER — Other Ambulatory Visit (HOSPITAL_BASED_OUTPATIENT_CLINIC_OR_DEPARTMENT_OTHER): Payer: Self-pay

## 2022-09-13 MED ORDER — AREXVY 120 MCG/0.5ML IM SUSR
INTRAMUSCULAR | 0 refills | Status: DC
  Filled 2022-09-13: qty 0.5, 1d supply, fill #0

## 2022-09-15 ENCOUNTER — Other Ambulatory Visit (HOSPITAL_BASED_OUTPATIENT_CLINIC_OR_DEPARTMENT_OTHER): Payer: Self-pay

## 2022-09-15 MED ORDER — INFLUENZA VAC A&B SA ADJ QUAD 0.5 ML IM PRSY
PREFILLED_SYRINGE | INTRAMUSCULAR | 0 refills | Status: DC
  Filled 2022-09-15: qty 0.5, 1d supply, fill #0

## 2022-09-16 ENCOUNTER — Other Ambulatory Visit (HOSPITAL_BASED_OUTPATIENT_CLINIC_OR_DEPARTMENT_OTHER): Payer: Self-pay

## 2022-09-19 ENCOUNTER — Ambulatory Visit: Payer: Federal, State, Local not specified - PPO | Admitting: Obstetrics and Gynecology

## 2022-09-29 ENCOUNTER — Ambulatory Visit: Payer: Federal, State, Local not specified - PPO | Admitting: Obstetrics and Gynecology

## 2022-09-30 ENCOUNTER — Ambulatory Visit: Payer: Federal, State, Local not specified - PPO | Admitting: Obstetrics and Gynecology

## 2022-10-04 NOTE — Progress Notes (Signed)
81 y.o. G38P2004 Widowed Caucasian female here for annual exam.    No GYN concerns.   Still using Premarin cream.   Needs a refill.   She has some moles under her bra line. Has a dermatologist.   Having low back issues and will see a Psychologist, sport and exercise at [facility].   PCP:   Tawnya Crook MD   No LMP recorded (lmp unknown). Patient has had a hysterectomy.           Sexually active: No.  The current method of family planning is status post hysterectomy.    Exercising: Yes.     Walking  Smoker:  no  Health Maintenance: Pap:   04-27-18 Neg, 04-13-16 Neg, 2002 Neg History of abnormal Pap:    yes,  hx abnormal paps in her 67s and the reason for her hysterectomy MMG:  10/06/21 density B Bi-rads 1 neg  Colonoscopy:  08/06/21 virtual colonoscopy normal  BMD:   11/27/19  Result  Osteopenia.  On Prolia.  PCP.  TDaP:  10/18/21 Gardasil:   no HIV:2019 Hep C:2019 Screening Labs:  PCP Covid vaccine:  last week.  Flu vaccine:  completed.    reports that she has never smoked. She has never used smokeless tobacco. She reports current alcohol use of about 2.0 standard drinks of alcohol per week. She reports that she does not use drugs.  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 Trial of PT/injection. If doesn't do well good candidate for reverse shoulder arthoplasty. Continue with PT (12/19). No specific f/u was scheduled.    Osteoarthritis    hands   Osteoporosis    Polymyalgia rheumatica ([place])     Past Surgical History:  Procedure Laterality Date   ABDOMINAL HYSTERECTOMY     BREAST SURGERY  2016    benign mass removed in [place] ARTHROPLASTY Left 04/16/2020   Procedure: REVERSE SHOULDER ARTHROPLASTY;  Surgeon: Justice Britain, MD;  Location: [facility];  Service: Orthopedics;  Laterality: Left;  118min   TOTAL VAGINAL HYSTERECTOMY  age 37   --due to abnormal pap smears per patient    Current Outpatient Medications  Medication Sig Dispense Refill   acetaminophen (TYLENOL) 500 MG tablet Take 1,000 mg by mouth at bedtime.     amLODipine (NORVASC) 10 MG tablet Take 1 tablet (10 mg total) by mouth daily. 90 tablet 1   B Complex-Biotin-FA (B-COMPLEX PO) Take 1 tablet by mouth daily. Super     Calcium Carb-Cholecalciferol (CALCIUM CARBONATE-VITAMIN D3) 600-400 MG-UNIT TABS Take 1 tablet by mouth daily.      Cholecalciferol (VITAMIN D3) 50 MCG (2000 UT) TABS Take 2,000 mg by mouth daily.     conjugated estrogens (PREMARIN) vaginal cream Place 1/2 gram per vagina at bedtime twice weekly. 30 g 1   COVID-19 mRNA vaccine 2023-2024 (COMIRNATY) SUSP injection Inject into the muscle. 0.3 mL 0   denosumab (PROLIA) 60 MG/ML SOSY injection Inject 60 mg into the skin every 6 (  six) months.     lamoTRIgine (LAMICTAL) 150 MG tablet Take 1 tablet (150 mg total) by mouth daily. 90 tablet 1   losartan (COZAAR) 100 MG tablet Take 1 tablet (100 mg total) by mouth daily. 90 tablet 1   Multiple Vitamins-Minerals (MULTIVITAMIN PO) Take 1 tablet by mouth daily. Centrum silver     Omega-3 Fatty Acids (FISH OIL) 1000 MG CAPS Take 1,000 mg by mouth daily.      Polyethyl Glycol-Propyl Glycol (SYSTANE ULTRA OP) Place 1 drop into both eyes in the morning and at bedtime.      White Petrolatum-Mineral Oil (SYSTANE NIGHTTIME) OINT Place 1 application into both eyes at bedtime as needed (Dry eye).      Zoster Vaccine Adjuvanted [facility]) injection Inject into the muscle. 0.5 mL 0   Emollient (AMLACTIN ULTRA SMOOTHING) 15 % CREA as directed Externally     influenza vaccine  adjuvanted (FLUAD) 0.5 ML injection Inject into the muscle. (Patient not taking: Reported on 10/11/2022) 0.5 mL 0   RSV vaccine recomb adjuvanted (AREXVY) 120 MCG/0.5ML injection Inject into the muscle. (Patient not taking: Reported on 10/11/2022) 0.5 mL 0   No current facility-administered medications for this visit.    Family History  Problem Relation Age of Onset   Congestive Heart Failure Mother    Stroke Mother    Diabetes Father    Colon cancer Brother    Diabetes Brother    Diabetes Brother    Stroke Brother    Colon cancer Daughter     Review of Systems  All other systems reviewed and are negative.   Exam:   BP 134/76   Pulse 82   Ht 5' (1.524 m)   Wt 126 lb (57.2 kg)   LMP  (LMP Unknown)   SpO2 100%   BMI 24.61 kg/m     General appearance: alert, cooperative and appears stated age Head: normocephalic, without obvious abnormality, atraumatic Neck: no adenopathy, supple, symmetrical, trachea midline and thyroid normal to inspection and palpation Lungs: clear to auscultation bilaterally Breasts: normal appearance, no masses or tenderness, No nipple retraction or dimpling, No nipple discharge or bleeding, No axillary adenopathy Heart: regular rate and rhythm Abdomen: soft, non-tender; no masses, no organomegaly Extremities: extremities normal, atraumatic, no cyanosis or edema Skin: skin color, texture, turgor normal. No rashes or lesions Lymph nodes: cervical, supraclavicular, and axillary nodes normal. Neurologic: grossly normal  Pelvic: External genitalia:  no lesions              No abnormal inguinal nodes palpated.              Urethra:  normal appearing urethra with no masses, tenderness or lesions              Bartholins and Skenes: normal                 Vagina: normal appearing vagina with normal color and discharge, no lesions              Cervix: absent              Pap taken: no Bimanual Exam:  Uterus:  absent              Adnexa: no mass, fullness,  tenderness              Rectal exam: yes.  Confirms.              Anus:  normal sphincter tone, no lesions  Chaperone was present for exam:  Eustace Pen, CMA  Assessment:   Well woman visit with gynecologic exam. Status post TVH for abnormal paps in her 68s.  Ovaries remain.  Vaginal atrophy.  Osteoporosis.  On Prolia.   Plan: Mammogram screening discussed.  She will schedule.  Self breast awareness reviewed. Pap and HR HPV not indicated.  Guidelines for Calcium, Vitamin D, regular exercise program including cardiovascular and weight bearing exercise. Refill Premarin cream.  I discussed potential effect on breast cancer. BMD and Prolia through PCP.  Follow up annually and prn.   After visit summary provided.

## 2022-10-05 IMAGING — MG MM DIGITAL SCREENING BILAT W/ TOMO AND CAD
8 series · 8 of 24 positions shown · non-contrast
Comparison: Previous exam(s).

CLINICAL DATA: Screening.

EXAM:
DIGITAL SCREENING BILATERAL MAMMOGRAM WITH TOMOSYNTHESIS AND CAD
TECHNIQUE: Bilateral screening digital craniocaudal and mediolateral oblique
mammograms were obtained. Bilateral screening digital breast
tomosynthesis was performed. The images were evaluated with
computer-aided detection.

[R CC synth-2D]
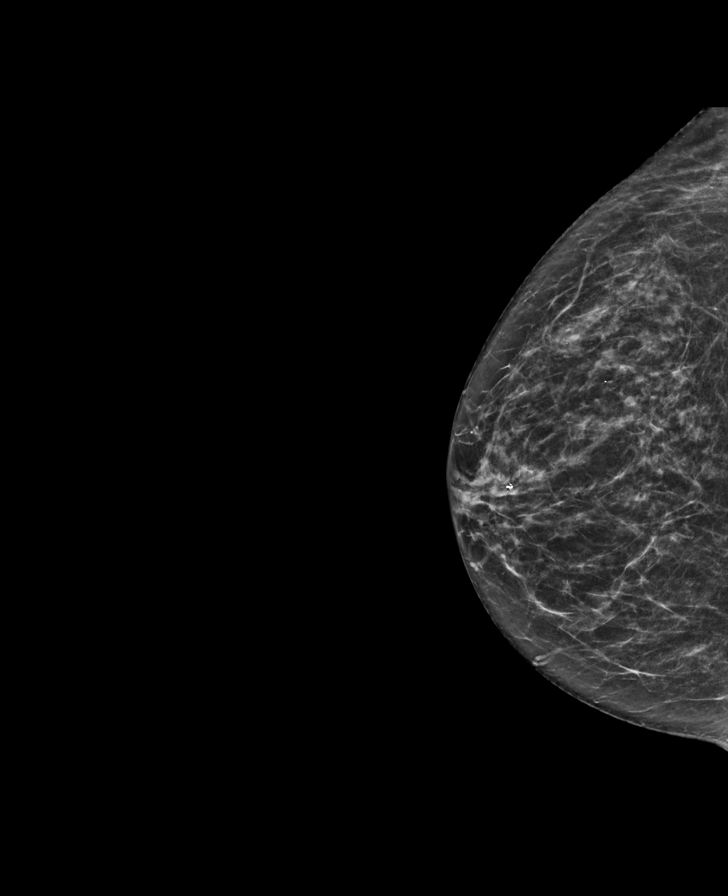

[L CC synth-2D]
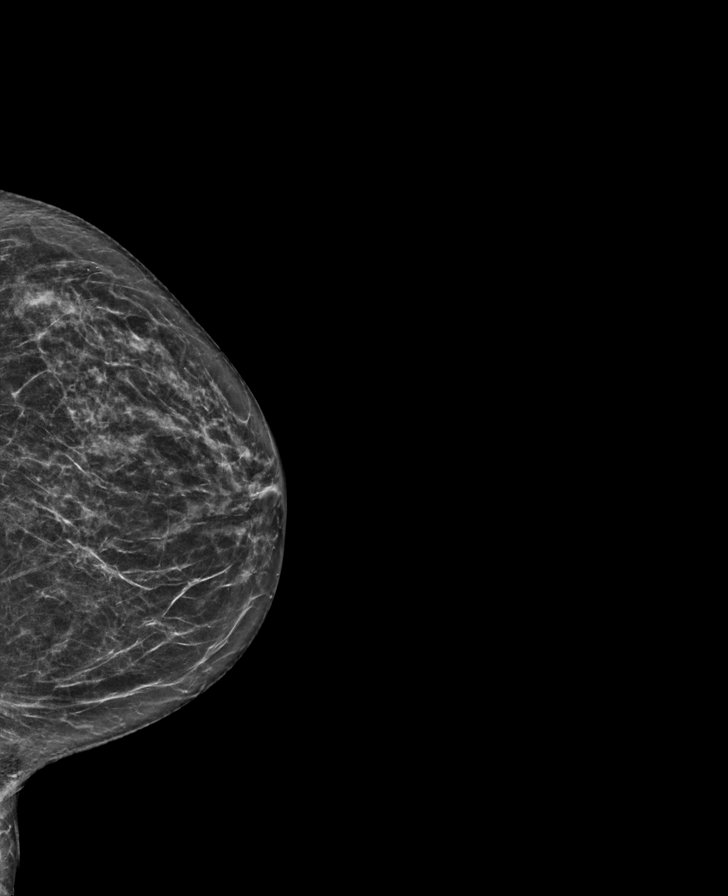

[R MLO synth-2D]
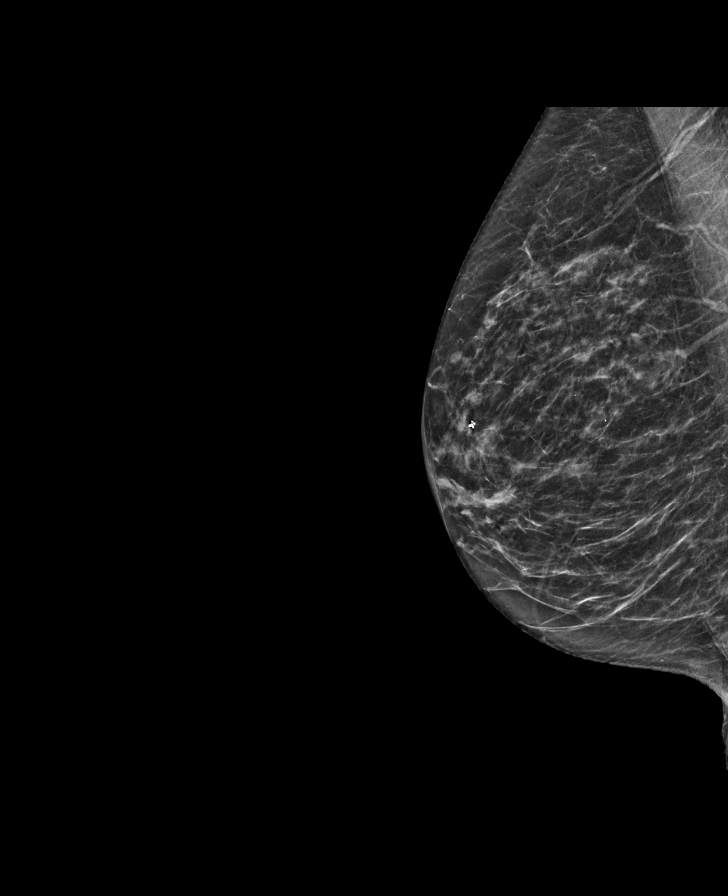

[L MLO synth-2D]
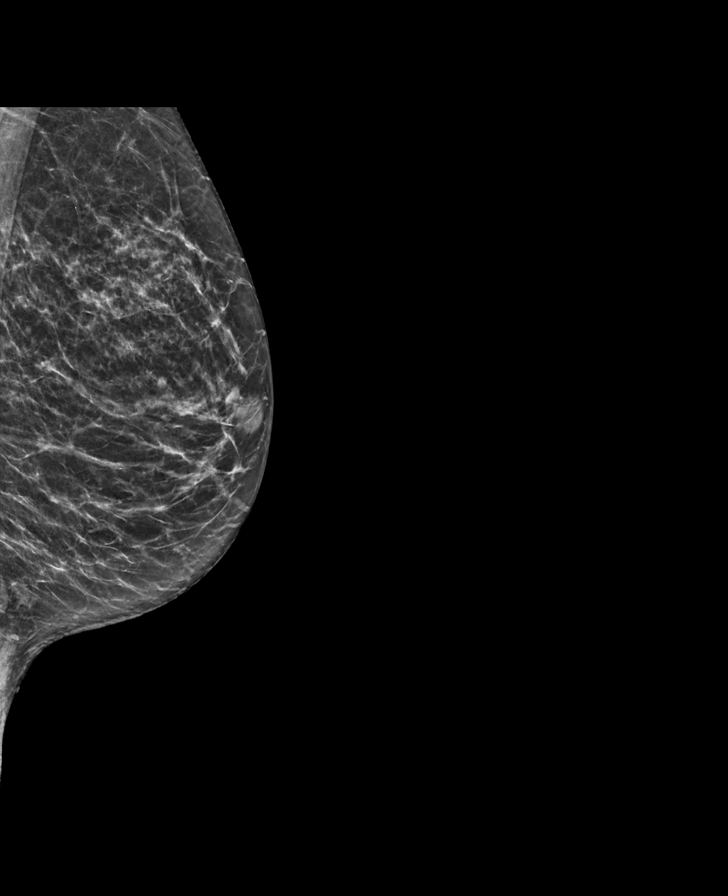

[R CC tomo · tomo slice 31/61.0]
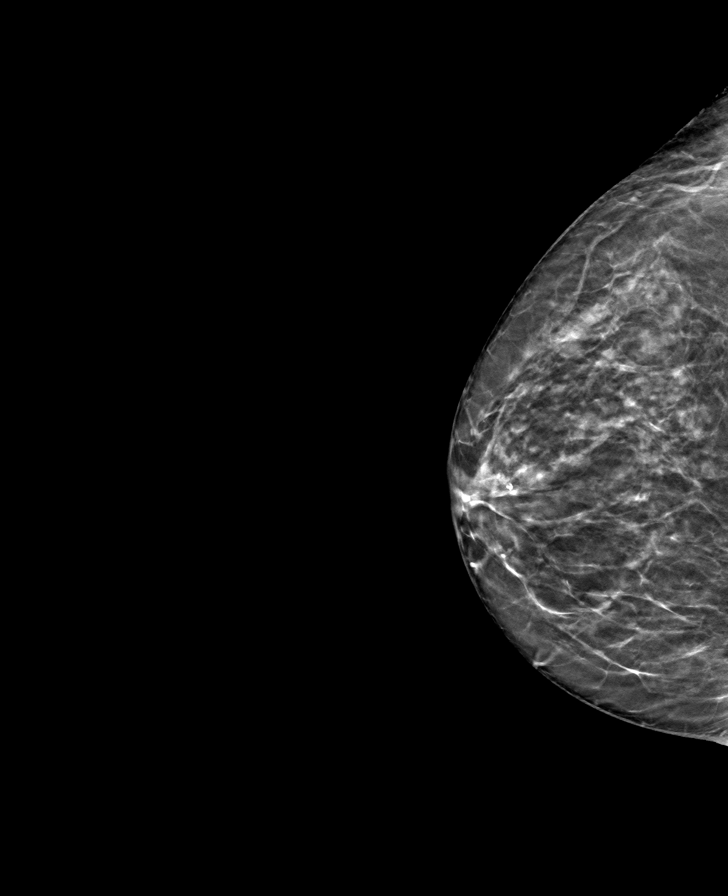

[R MLO tomo · tomo slice 31/62.0]
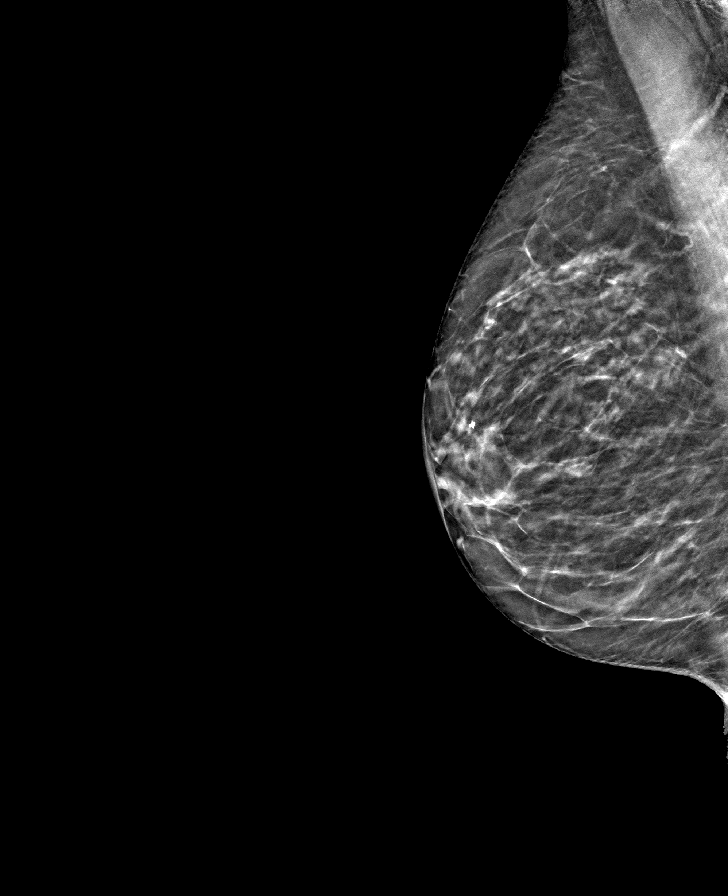

[L CC tomo · tomo slice 29/58.0]
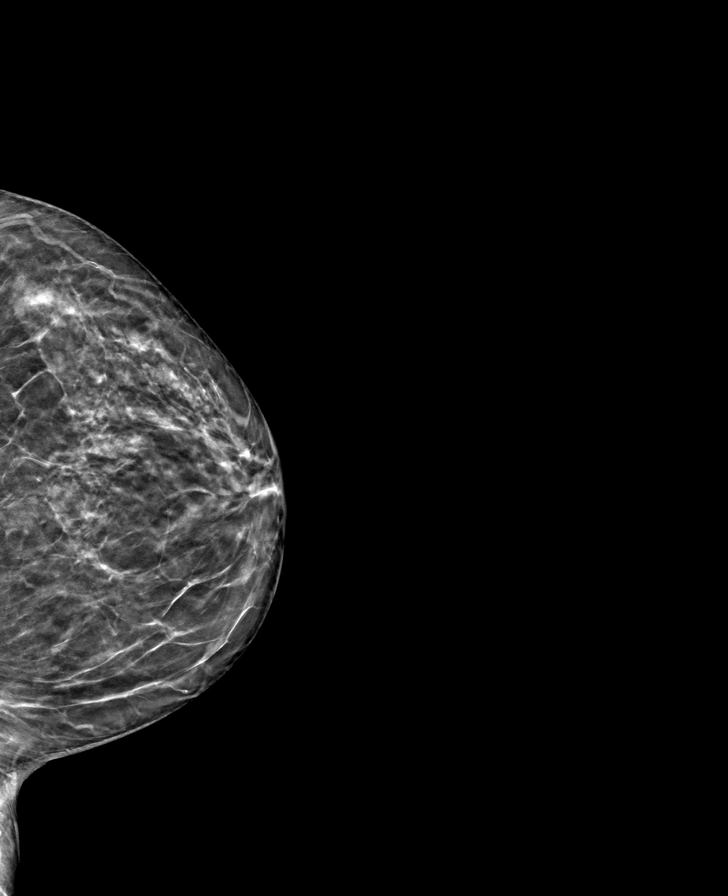

[L MLO tomo · tomo slice 29/57.0]
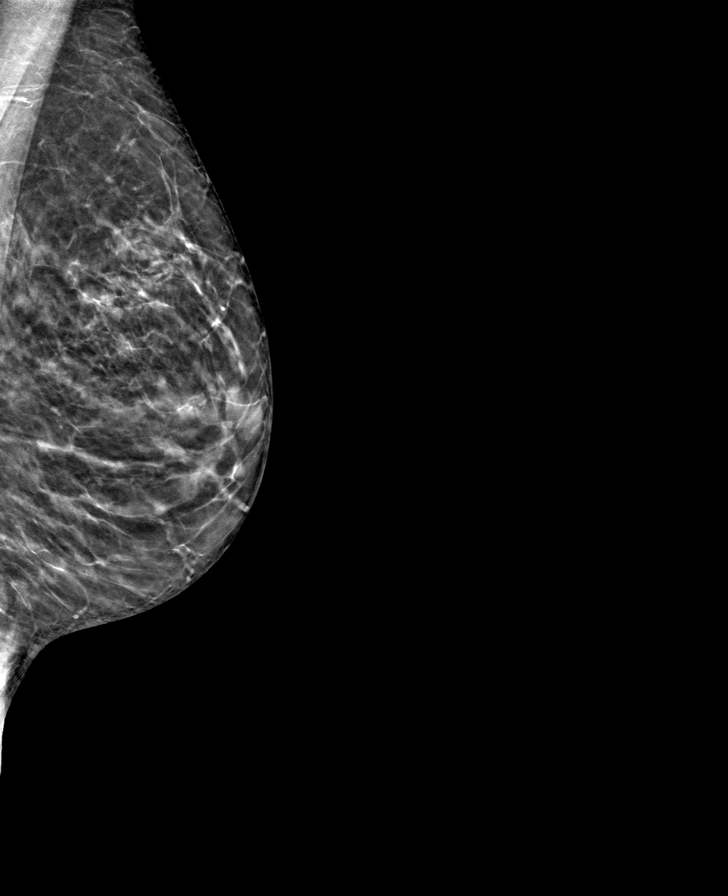

[8 of 24 positions shown; findings below may reference images not displayed]

ACR Breast Density Category b: There are scattered areas of
fibroglandular density.
FINDINGS: There are no findings suspicious for malignancy.
IMPRESSION: No mammographic evidence of malignancy. A result letter of this
screening mammogram will be mailed directly to the patient.

RECOMMENDATION:
Screening mammogram in one year. (Code:51-O-LD2)

BI-RADS CATEGORY  1: Negative.

## 2022-10-07 ENCOUNTER — Other Ambulatory Visit (HOSPITAL_BASED_OUTPATIENT_CLINIC_OR_DEPARTMENT_OTHER): Payer: Self-pay

## 2022-10-07 MED ORDER — COVID-19 MRNA 2023-2024 VACCINE (COMIRNATY) 0.3 ML INJECTION
INTRAMUSCULAR | 0 refills | Status: DC
  Filled 2022-10-07: qty 0.3, 1d supply, fill #0

## 2022-10-11 ENCOUNTER — Ambulatory Visit (INDEPENDENT_AMBULATORY_CARE_PROVIDER_SITE_OTHER): Payer: Federal, State, Local not specified - PPO | Admitting: Obstetrics and Gynecology

## 2022-10-11 ENCOUNTER — Encounter: Payer: Self-pay | Admitting: Obstetrics and Gynecology

## 2022-10-11 VITALS — BP 134/76 | HR 82 | Ht 60.0 in | Wt 126.0 lb

## 2022-10-11 DIAGNOSIS — Z01419 Encounter for gynecological examination (general) (routine) without abnormal findings: Secondary | ICD-10-CM | POA: Diagnosis not present

## 2022-10-11 DIAGNOSIS — N952 Postmenopausal atrophic vaginitis: Secondary | ICD-10-CM | POA: Diagnosis not present

## 2022-10-11 MED ORDER — PREMARIN 0.625 MG/GM VA CREA: TOPICAL_CREAM | VAGINAL | 1 refills | Status: DC

## 2022-10-11 NOTE — Patient Instructions (Signed)

## 2022-10-19 ENCOUNTER — Encounter: Payer: Self-pay | Admitting: Psychiatry

## 2022-10-19 ENCOUNTER — Ambulatory Visit (INDEPENDENT_AMBULATORY_CARE_PROVIDER_SITE_OTHER): Payer: Federal, State, Local not specified - PPO | Admitting: Psychiatry

## 2022-10-19 DIAGNOSIS — F411 Generalized anxiety disorder: Secondary | ICD-10-CM | POA: Diagnosis not present

## 2022-10-19 DIAGNOSIS — F3181 Bipolar II disorder: Secondary | ICD-10-CM

## 2022-10-19 DIAGNOSIS — F4323 Adjustment disorder with mixed anxiety and depressed mood: Secondary | ICD-10-CM

## 2022-10-19 DIAGNOSIS — G8929 Other chronic pain: Secondary | ICD-10-CM

## 2022-10-19 DIAGNOSIS — F1024 Alcohol dependence with alcohol-induced mood disorder: Secondary | ICD-10-CM

## 2022-10-19 DIAGNOSIS — M5442 Lumbago with sciatica, left side: Secondary | ICD-10-CM | POA: Diagnosis not present

## 2022-10-19 MED ORDER — LAMOTRIGINE 150 MG PO TABS: 150.0000 mg | ORAL_TABLET | Freq: Every day | ORAL | 1 refills | Status: DC

## 2022-10-19 MED ORDER — NALTREXONE HCL 50 MG PO TABS: ORAL_TABLET | ORAL | 0 refills | Status: DC
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
## 2022-10-19 NOTE — Patient Instructions (Signed)
[facility], [facility]

## 2022-10-27 ENCOUNTER — Other Ambulatory Visit: Payer: Self-pay | Admitting: Obstetrics and Gynecology

## 2022-10-27 DIAGNOSIS — Z1231 Encounter for screening mammogram for malignant neoplasm of breast: Secondary | ICD-10-CM

## 2022-11-01 ENCOUNTER — Ambulatory Visit
Admission: RE | Admit: 2022-11-01 | Discharge: 2022-11-01 | Disposition: A | Discharge status: Home / Self Care | Payer: Federal, State, Local not specified - PPO | Source: Ambulatory Visit | Attending: Obstetrics and Gynecology | Admitting: Obstetrics and Gynecology

## 2022-11-01 DIAGNOSIS — Z1231 Encounter for screening mammogram for malignant neoplasm of breast: Secondary | ICD-10-CM

## 2022-12-08 ENCOUNTER — Encounter: Payer: Self-pay | Admitting: *Deleted

## 2022-12-08 NOTE — Telephone Encounter (Signed)
Forwarding to RX Prior Auth Team 

## 2022-12-09 ENCOUNTER — Other Ambulatory Visit (HOSPITAL_COMMUNITY): Payer: Self-pay

## 2022-12-09 NOTE — Telephone Encounter (Signed)
Prolia VOB initiated via [email or web address] 

## 2022-12-10 ENCOUNTER — Emergency Department (HOSPITAL_BASED_OUTPATIENT_CLINIC_OR_DEPARTMENT_OTHER): Payer: Federal, State, Local not specified - PPO

## 2022-12-10 ENCOUNTER — Emergency Department (HOSPITAL_BASED_OUTPATIENT_CLINIC_OR_DEPARTMENT_OTHER)
Admission: EM | Admit: 2022-12-10 | Discharge: 2022-12-10 | Disposition: A | Discharge status: Home / Self Care | Payer: Federal, State, Local not specified - PPO | Attending: Emergency Medicine | Admitting: Emergency Medicine

## 2022-12-10 ENCOUNTER — Other Ambulatory Visit: Payer: Self-pay

## 2022-12-10 ENCOUNTER — Encounter (HOSPITAL_BASED_OUTPATIENT_CLINIC_OR_DEPARTMENT_OTHER): Payer: Self-pay

## 2022-12-10 DIAGNOSIS — Z79899 Other long term (current) drug therapy: Secondary | ICD-10-CM | POA: Insufficient documentation

## 2022-12-10 DIAGNOSIS — S01111A Laceration without foreign body of right eyelid and periocular area, initial encounter: Secondary | ICD-10-CM | POA: Diagnosis not present

## 2022-12-10 DIAGNOSIS — Y92009 Unspecified place in unspecified non-institutional (private) residence as the place of occurrence of the external cause: Secondary | ICD-10-CM | POA: Insufficient documentation

## 2022-12-10 DIAGNOSIS — S0181XA Laceration without foreign body of other part of head, initial encounter: Secondary | ICD-10-CM

## 2022-12-10 DIAGNOSIS — W19XXXA Unspecified fall, initial encounter: Secondary | ICD-10-CM

## 2022-12-10 DIAGNOSIS — S0990XA Unspecified injury of head, initial encounter: Secondary | ICD-10-CM | POA: Diagnosis not present

## 2022-12-10 DIAGNOSIS — W01198A Fall on same level from slipping, tripping and stumbling with subsequent striking against other object, initial encounter: Secondary | ICD-10-CM | POA: Insufficient documentation

## 2022-12-10 DIAGNOSIS — S0993XA Unspecified injury of face, initial encounter: Secondary | ICD-10-CM | POA: Diagnosis present

## 2022-12-10 MED ORDER — LIDOCAINE HCL 2 % IJ SOLN
10.0000 mL | Freq: Once | INTRAMUSCULAR | Status: AC
  Administered 2022-12-10: 200 mg
  Filled 2022-12-10: qty 20

## 2022-12-10 NOTE — Discharge Instructions (Addendum)
Christine Scott,  I'm so sorry about your fall. Thankfully it doesn't look like you broke anything or caused any bleeding into your head.  We repaired the laceration to your forehead with some sutures.  These are dissolvable sutures but if you do not want to wait for them to dissolve, you can see your primary care doctor to have these removed, preferably on Thursday or Friday of next week. If you start to develop fevers or new redness around the area, please see your doctor sooner or come back to see [place] here.   Be well,  Dorothyann Gibbs, MD

## 2022-12-10 NOTE — ED Provider Notes (Signed)
[facility] EMERGENCY DEPT Provider Note   CSN: [id number] Arrival date & time: 12/10/22  1638     History  Chief Complaint  Patient presents with   Christine Scott is a 81 y.o. female.   Fall Pertinent negatives include no headaches.  Patient presents after a fall at home when she tripped over her slippers. Hit her head on the metal railing of her bed.  This happened at about 11:00 last night.  She did not lose consciousness.  Denies any visual changes, headache, dizziness since the event.  She is not on blood thinners.  Feels well otherwise great Last TDaP in 09/2021.      Home Medications Prior to Admission medications   Medication Sig Start Date End Date Taking? Authorizing Provider  acetaminophen (TYLENOL) 500 MG tablet Take 1,000 mg by mouth at bedtime.    [provider]  amLODipine (NORVASC) 10 MG tablet Take 1 tablet (10 mg total) by mouth daily. 09/07/22   [name], MD  B Complex-Biotin-FA (B-COMPLEX PO) Take 1 tablet by mouth daily. Super    [provider]  Calcium Carb-Cholecalciferol (CALCIUM CARBONATE-VITAMIN D3) 600-400 MG-UNIT TABS Take 1 tablet by mouth daily.  07/23/15   [provider]  Cholecalciferol (VITAMIN D3) 50 MCG (2000 UT) TABS Take 2,000 mg by mouth daily.    [provider]  conjugated estrogens (PREMARIN) vaginal cream Place 1/2 gram per vagina at bedtime twice weekly. 10/11/22   Patton Salles, MD  denosumab (PROLIA) 60 MG/ML SOSY injection Inject 60 mg into the skin every 6 (six) months.    [provider]  Emollient (AMLACTIN ULTRA SMOOTHING) 15 % CREA as directed Externally 08/02/22   [provider]  lamoTRIgine (LAMICTAL) 150 MG tablet Take 1 tablet (150 mg total) by mouth daily. 10/19/22   Cottle, Steva Ready., MD  losartan (COZAAR) 100 MG tablet Take 1 tablet (100 mg total) by mouth daily. 09/07/22   [name], MD  Multiple Vitamins-Minerals  (MULTIVITAMIN PO) Take 1 tablet by mouth daily. Centrum silver    [provider]  naltrexone (DEPADE) 50 MG tablet 1/2 tablet nightly for 1 week then 1 nightly 10/19/22   Cottle, Steva Ready., MD  Omega-3 Fatty Acids (FISH OIL) 1000 MG CAPS Take 1,000 mg by mouth daily.     [provider]  Polyethyl Glycol-Propyl Glycol (SYSTANE ULTRA OP) Place 1 drop into both eyes in the morning and at bedtime.     [provider]  White Petrolatum-Mineral Oil (SYSTANE NIGHTTIME) OINT Place 1 application into both eyes at bedtime as needed (Dry eye).     [provider]      Allergies    Patient has no known allergies.    Review of Systems   Review of Systems  Eyes:  Negative for pain and visual disturbance.  Musculoskeletal:  Negative for neck pain and neck stiffness.  Neurological:  Negative for dizziness, syncope, weakness and headaches.  All other systems reviewed and are negative.   Physical Exam Updated Vital Signs BP (!) 171/67 (BP Location: Right Arm)   Pulse 84   Temp 98 F (36.7 C) (Oral)   Resp 16   Ht 5' (1.524 m)   Wt 57.2 kg   LMP  (LMP Unknown)   SpO2 99%   BMI 24.63 kg/m  Physical Exam Vitals reviewed.  Constitutional:      General: She is not in  acute distress. HENT:     Head:     Comments: Bruising about R eye, 1.5cm laceration just above R eyebrow with focal clot within wound Eyes:     Extraocular Movements: Extraocular movements intact.     Pupils: Pupils are equal, round, and reactive to light.  Neck:     Comments: Palpation of the cervical spine without bony abnormality or tenderness. FROM in all planes without pain.  Cardiovascular:     Rate and Rhythm: Normal rate and regular rhythm.     Pulses: Normal pulses.  Pulmonary:     Effort: Pulmonary effort is normal.  Neurological:     Mental Status: She is alert and oriented to person, place, and time.     Cranial Nerves: No cranial nerve deficit.     Sensory: No sensory  deficit.     Motor: No weakness.  Psychiatric:        Mood and Affect: Mood normal.     ED Results / Procedures / Treatments   Labs (all labs ordered are listed, but only abnormal results are displayed) Labs Reviewed - No data to display  EKG None  Radiology CT Maxillofacial Wo Contrast  Result Date: 12/10/2022 CLINICAL DATA:  Head trauma EXAM: CT HEAD WITHOUT CONTRAST CT MAXILLOFACIAL WITHOUT CONTRAST CT CERVICAL SPINE WITHOUT CONTRAST TECHNIQUE: Multidetector CT imaging of the head, cervical spine, and maxillofacial structures were performed using the standard protocol without intravenous contrast. Multiplanar CT image reconstructions of the cervical spine and maxillofacial structures were also generated. RADIATION DOSE REDUCTION: This exam was performed according to the departmental dose-optimization program which includes automated exposure control, adjustment of the mA and/or kV according to patient size and/or use of iterative reconstruction technique. COMPARISON:  None Available. FINDINGS: CT HEAD FINDINGS Brain: There is no mass, hemorrhage or extra-axial collection. The size and configuration of the ventricles and extra-axial CSF spaces are normal. There is hypoattenuation of the periventricular white matter, most commonly indicating chronic ischemic microangiopathy. Vascular: No abnormal hyperdensity of the major intracranial arteries or dural venous sinuses. No intracranial atherosclerosis. Skull: The visualized skull base, calvarium and extracranial soft tissues are normal. CT MAXILLOFACIAL FINDINGS Osseous: No acute fracture Orbits: The globes are intact. Normal appearance of the intra- and extraconal fat. Symmetric extraocular muscles and optic nerves. Sinuses: No fluid levels or advanced mucosal thickening. Soft tissues: Small right supraorbital laceration. CT CERVICAL SPINE FINDINGS Alignment: Grade 1 anterolisthesis at C3-4 and C4-5. Skull base and vertebrae: No acute fracture.  Soft tissues and spinal canal: No prevertebral fluid or swelling. No visible canal hematoma. Disc levels: No advanced spinal canal or neural foraminal stenosis. Upper chest: No pneumothorax, pulmonary nodule or pleural effusion. Other: Normal visualized paraspinal cervical soft tissues. IMPRESSION: 1. No acute intracranial abnormality. 2. Small right supraorbital laceration without facial fracture. 3. No acute fracture or static subluxation of the cervical spine. Electronically Signed   By: Deatra Robinson M.D.   On: 12/10/2022 20:45   CT Head Wo Contrast  Result Date: 12/10/2022 CLINICAL DATA:  Head trauma EXAM: CT HEAD WITHOUT CONTRAST CT MAXILLOFACIAL WITHOUT CONTRAST CT CERVICAL SPINE WITHOUT CONTRAST TECHNIQUE: Multidetector CT imaging of the head, cervical spine, and maxillofacial structures were performed using the standard protocol without intravenous contrast. Multiplanar CT image reconstructions of the cervical spine and maxillofacial structures were also generated. RADIATION DOSE REDUCTION: This exam was performed according to the departmental dose-optimization program which includes automated exposure control, adjustment of the mA and/or kV according to patient size and/or  use of iterative reconstruction technique. COMPARISON:  None Available. FINDINGS: CT HEAD FINDINGS Brain: There is no mass, hemorrhage or extra-axial collection. The size and configuration of the ventricles and extra-axial CSF spaces are normal. There is hypoattenuation of the periventricular white matter, most commonly indicating chronic ischemic microangiopathy. Vascular: No abnormal hyperdensity of the major intracranial arteries or dural venous sinuses. No intracranial atherosclerosis. Skull: The visualized skull base, calvarium and extracranial soft tissues are normal. CT MAXILLOFACIAL FINDINGS Osseous: No acute fracture Orbits: The globes are intact. Normal appearance of the intra- and extraconal fat. Symmetric extraocular  muscles and optic nerves. Sinuses: No fluid levels or advanced mucosal thickening. Soft tissues: Small right supraorbital laceration. CT CERVICAL SPINE FINDINGS Alignment: Grade 1 anterolisthesis at C3-4 and C4-5. Skull base and vertebrae: No acute fracture. Soft tissues and spinal canal: No prevertebral fluid or swelling. No visible canal hematoma. Disc levels: No advanced spinal canal or neural foraminal stenosis. Upper chest: No pneumothorax, pulmonary nodule or pleural effusion. Other: Normal visualized paraspinal cervical soft tissues. IMPRESSION: 1. No acute intracranial abnormality. 2. Small right supraorbital laceration without facial fracture. 3. No acute fracture or static subluxation of the cervical spine. Electronically Signed   By: Deatra RobinsonKevin  Herman M.D.   On: 12/10/2022 20:45   CT Cervical Spine Wo Contrast  Result Date: 12/10/2022 CLINICAL DATA:  Head trauma EXAM: CT HEAD WITHOUT CONTRAST CT MAXILLOFACIAL WITHOUT CONTRAST CT CERVICAL SPINE WITHOUT CONTRAST TECHNIQUE: Multidetector CT imaging of the head, cervical spine, and maxillofacial structures were performed using the standard protocol without intravenous contrast. Multiplanar CT image reconstructions of the cervical spine and maxillofacial structures were also generated. RADIATION DOSE REDUCTION: This exam was performed according to the departmental dose-optimization program which includes automated exposure control, adjustment of the mA and/or kV according to patient size and/or use of iterative reconstruction technique. COMPARISON:  None Available. FINDINGS: CT HEAD FINDINGS Brain: There is no mass, hemorrhage or extra-axial collection. The size and configuration of the ventricles and extra-axial CSF spaces are normal. There is hypoattenuation of the periventricular white matter, most commonly indicating chronic ischemic microangiopathy. Vascular: No abnormal hyperdensity of the major intracranial arteries or dural venous sinuses. No  intracranial atherosclerosis. Skull: The visualized skull base, calvarium and extracranial soft tissues are normal. CT MAXILLOFACIAL FINDINGS Osseous: No acute fracture Orbits: The globes are intact. Normal appearance of the intra- and extraconal fat. Symmetric extraocular muscles and optic nerves. Sinuses: No fluid levels or advanced mucosal thickening. Soft tissues: Small right supraorbital laceration. CT CERVICAL SPINE FINDINGS Alignment: Grade 1 anterolisthesis at C3-4 and C4-5. Skull base and vertebrae: No acute fracture. Soft tissues and spinal canal: No prevertebral fluid or swelling. No visible canal hematoma. Disc levels: No advanced spinal canal or neural foraminal stenosis. Upper chest: No pneumothorax, pulmonary nodule or pleural effusion. Other: Normal visualized paraspinal cervical soft tissues. IMPRESSION: 1. No acute intracranial abnormality. 2. Small right supraorbital laceration without facial fracture. 3. No acute fracture or static subluxation of the cervical spine. Electronically Signed   By: Deatra RobinsonKevin  Herman M.D.   On: 12/10/2022 20:45    Procedures .Marland Kitchen.Laceration Repair  Date/Time: 12/10/2022 9:37 PM  Performed by: Alicia AmelSanford, Galilea Quito B, MD Authorized by: Rondel BatonPaterson, Robert C, MD   Consent:    Consent obtained:  Verbal   Consent given by:  Patient   Risks, benefits, and alternatives were discussed: yes     Risks discussed:  Pain, poor cosmetic result and poor wound healing   Alternatives discussed:  No treatment Universal protocol:  Procedure explained and questions answered to patient or proxy's satisfaction: yes     Imaging studies available: yes   Anesthesia:    Anesthesia method:  Nerve block   Block location:  Supraorbital   Block needle gauge:  25 G   Block anesthetic:  Lidocaine 2% w/o epi   Block injection procedure:  Anatomic landmarks palpated, introduced needle and negative aspiration for blood   Block outcome:  Anesthesia achieved Laceration details:    Location:   Face   Face location:  R eyebrow   Length (cm):  1.5   Depth (mm):  1 Pre-procedure details:    Preparation:  Patient was prepped and draped in usual sterile fashion and imaging obtained to evaluate for foreign bodies Exploration:    Limited defect created (wound extended): no     Hemostasis achieved with:  Direct pressure Treatment:    Area cleansed with:  Saline   Amount of cleaning:  Standard   Irrigation solution:  Sterile saline   Irrigation method:  Syringe   Visualized foreign bodies/material removed: yes     Debridement:  None   Undermining:  None   Scar revision: no   Skin repair:    Repair method:  Sutures   Suture size:  5-0   Wound skin closure material used: Vicryl Rapide.   Suture technique:  Simple interrupted   Number of sutures:  3 Approximation:    Approximation:  Close   Medications Ordered in ED Medications  lidocaine (XYLOCAINE) 2 % (with pres) injection 200 mg (200 mg Infiltration Given 12/10/22 2017)    ED Course/ Medical Decision Making/ A&P                           Medical Decision Making Patient presents after a fall at home with head trauma, now with laceration to the right eyebrow.  Injury happened approximately 20 hours prior to presentation.  Patient without bony tenderness to neck.  No neurologic deficits alert and oriented.  Bruising to the face.  CT head, maxillofacial, C-spine all negative for acute fracture.  Laceration repaired as above, patient stable for discharge home with PCP follow-up in 5-6 days  Amount and/or Complexity of Data Reviewed Radiology: ordered.  Risk Prescription drug management.    Final Clinical Impression(s) / ED Diagnoses Final diagnoses:  Fall, initial encounter  Facial laceration, initial encounter    Rx / DC Orders ED Discharge Orders     None      Dorothyann Gibbs, MD    Alicia Amel, MD 12/10/22 2146    Rondel Baton, MD 12/12/22 [phone]

## 2022-12-10 NOTE — ED Triage Notes (Signed)
Patient here POV from Home.  Endorses Falling Last PM due to Poor Slipper Choice. Slipped onto Metal Railing. Bruising surrounding Right Eye. 2-3 cm laceration above Right Eye.   Unknown Tetanus Status. No LOC. No Anticoagulants.   NAD Noted during Triage. A&Ox4. GCS 15. Ambulatory.

## 2022-12-16 ENCOUNTER — Encounter: Payer: Self-pay | Admitting: Family Medicine

## 2022-12-16 ENCOUNTER — Ambulatory Visit (INDEPENDENT_AMBULATORY_CARE_PROVIDER_SITE_OTHER): Payer: Federal, State, Local not specified - PPO | Admitting: Family Medicine

## 2022-12-16 VITALS — BP 130/60 | HR 77 | Temp 98.5°F | Ht 61.0 in | Wt 129.5 lb

## 2022-12-16 DIAGNOSIS — Z4802 Encounter for removal of sutures: Secondary | ICD-10-CM

## 2022-12-16 DIAGNOSIS — S01101A Unspecified open wound of right eyelid and periocular area, initial encounter: Secondary | ICD-10-CM | POA: Diagnosis not present

## 2022-12-16 NOTE — Patient Instructions (Signed)
Merry Christmas  Monitor for infection.   Can use neosporin few more days.

## 2022-12-16 NOTE — Progress Notes (Signed)
Subjective:     Patient ID: Christine Scott, female    DOB: 12/17/1941, 81 y.o.   MRN: [id number]  Chief Complaint  Patient presents with   Stictch removal    Remove stitches from right side forehead     HPI Suture removal-fell 12/15-tripped over slippers-went to ER on 12/16-1.5cm lac R eyebrow-reviewed notes.  Repaired w/3 sutures.  Here for removal.  Doing well. Tdap utd.  CT spine, head, face-neg for fx/bleed.  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 Trial of PT/injection. If doesn't do well good candidate for reverse shoulder arthoplasty. Continue with PT (12/19). No specific f/u was scheduled.    Osteoarthritis    hands   Osteoporosis    Polymyalgia rheumatica (HCC)     Past Surgical History:  Procedure Laterality Date   ABDOMINAL HYSTERECTOMY     BREAST SURGERY  2016   benign mass removed in [place], [place].   FACIAL COSMETIC SURGERY     REVERSE SHOULDER ARTHROPLASTY Left 04/16/2020   Procedure: REVERSE SHOULDER ARTHROPLASTY;  Surgeon: Francena Hanly, MD;  Location: [facility];  Service: Orthopedics;  Laterality: Left;    TOTAL VAGINAL HYSTERECTOMY  age 76   --due to abnormal pap smears per patient    Outpatient Medications Prior to Visit  Medication Sig Dispense Refill   acetaminophen (TYLENOL) 500 MG tablet Take 1,000 mg by mouth at bedtime.     amLODipine (NORVASC) 10 MG tablet Take 1 tablet (10 mg total) by mouth daily. 90 tablet 1   B Complex-Biotin-FA (B-COMPLEX PO) Take 1 tablet by mouth  daily. Super     Calcium Carb-Cholecalciferol (CALCIUM CARBONATE-VITAMIN D3) 600-400 MG-UNIT TABS Take 1 tablet by mouth daily.      Cholecalciferol (VITAMIN D3) 50 MCG (2000 UT) TABS Take 2,000 mg by mouth daily.     conjugated estrogens (PREMARIN) vaginal cream Place 1/2 gram per vagina at bedtime twice weekly. 30 g 1   denosumab (PROLIA) 60 MG/ML SOSY injection Inject 60 mg into the skin every 6 (six) months.     Emollient (AMLACTIN ULTRA SMOOTHING) 15 % CREA as directed Externally     lamoTRIgine (LAMICTAL) 150 MG tablet Take 1 tablet (150 mg total) by mouth daily. 90 tablet 1   losartan (COZAAR) 100 MG tablet Take 1 tablet (100 mg total) by mouth daily. 90 tablet 1   Multiple Vitamins-Minerals (MULTIVITAMIN PO) Take 1 tablet by mouth daily. Centrum silver     naltrexone (DEPADE) 50 MG tablet 1/2 tablet nightly for 1 week then 1 nightly 90 tablet 0   Omega-3 Fatty Acids (FISH OIL) 1000 MG CAPS Take 1,000 mg by mouth daily.      Polyethyl Glycol-Propyl Glycol (SYSTANE ULTRA OP) Place 1 drop into both eyes in the morning and at bedtime.      White Petrolatum-Mineral Oil (SYSTANE NIGHTTIME) OINT Place 1 application into both eyes at bedtime as needed (Dry eye).  No facility-administered medications prior to visit.    No Known Allergies ROS neg/noncontributory except as noted HPI/below      Objective:     BP 130/60   Pulse 77   Temp 98.5 F (36.9 C) (Temporal)   Ht 5\' 1"  (1.549 m)   Wt 129 lb 8 oz (58.7 kg)   LMP  (LMP Unknown)   SpO2 98%   BMI 24.47 kg/m  Wt Readings from Last 3 Encounters:  12/16/22 129 lb 8 oz (58.7 kg)  12/10/22 126 lb 1.7 oz (57.2 kg)  10/11/22 126 lb (57.2 kg)    Physical Exam   Gen: WDWN NAD HEENT: [place], conjunctiva not injected, sclera nonicteric eomi MSK: no gross abnormalities.  Some bruising of orbit R.  No s/s infection.  1.5cm lac healing.  Removed all 3 sutures-scab embedded.  No s/s infection.  Used saline for soaks as embedded NEURO:  A&O x3.  CN II-XII intact.  PSYCH: normal mood. Good eye contact  Procedure-informed consent verbally obtained.  Removed 3 sutures.  Pt tol well.  No bleeding.       Assessment & Plan:   Problem List Items Addressed This Visit   None Visit Diagnoses     Open wound of eyebrow without complication, right, initial encounter    -  Primary   Visit for suture removal          Wound R eyebrow-repaired in ER-here for suture removal-reviewed er records/studies.  Removed 3 sutures.  F/u prn  No orders of the defined types were placed in this encounter.   10/13/22, MD

## 2022-12-27 ENCOUNTER — Ambulatory Visit: Payer: Federal, State, Local not specified - PPO

## 2023-01-02 ENCOUNTER — Telehealth: Payer: Self-pay

## 2023-01-02 ENCOUNTER — Other Ambulatory Visit (HOSPITAL_COMMUNITY): Payer: Self-pay

## 2023-01-02 NOTE — Telephone Encounter (Signed)
Pharmacy Patient Advocate Encounter   Received notification from [facility] that prior authorization for Prolia is required/requested.  Per Test Claim: Prior auth required   PA submitted on 01/02/23 to (ins) [employer] via CoverMyMeds  Key [id number]  Status is pending

## 2023-01-05 ENCOUNTER — Other Ambulatory Visit (HOSPITAL_COMMUNITY): Payer: Self-pay

## 2023-01-05 NOTE — Telephone Encounter (Signed)
Pt ready for scheduling on or after 01/05/23  Out-of-pocket cost due at time of visit: $301  Primary: [employer] of [place] - FEP Prolia co-insurance: 30% (approximately $276) Admin fee co-insurance:30% (approximately $25)  Secondary: [place] Medicare Prolia co-insurance:  Admin fee co-insurance:   Deductible: $0  Prior Auth: approved PA# PA Case ID: [id number] Valid: 12/03/22 to 01/02/24  Per test claim, copay $255.00 through pharmacy benefits  ** This summary of benefits is an estimation of the patient's out-of-pocket cost. Exact cost may vary based on individual plan coverage.

## 2023-01-09 ENCOUNTER — Encounter: Payer: Self-pay | Admitting: Cardiology

## 2023-01-09 NOTE — Telephone Encounter (Signed)
error 

## 2023-01-11 ENCOUNTER — Telehealth: Payer: Self-pay | Admitting: Family Medicine

## 2023-01-11 NOTE — Telephone Encounter (Signed)
Patient states: - She has been seeing Dr. Lonzo Candy @ [facility] and audiology  - Audiologist believes patient's hearing is worsening; states would like to do hearing test  - Audiologist needs a referral before being able to complete hearing test   I offered pt an OV but she states she doesn't have the money to come in for another visit at this time. Please Advise.

## 2023-01-12 ENCOUNTER — Other Ambulatory Visit: Payer: Self-pay | Admitting: *Deleted

## 2023-01-12 DIAGNOSIS — Z01118 Encounter for examination of ears and hearing with other abnormal findings: Secondary | ICD-10-CM

## 2023-01-12 NOTE — Telephone Encounter (Signed)
Referral placed.

## 2023-01-13 ENCOUNTER — Telehealth (HOSPITAL_BASED_OUTPATIENT_CLINIC_OR_DEPARTMENT_OTHER): Payer: Self-pay | Admitting: Cardiology

## 2023-01-13 NOTE — Addendum Note (Signed)
Addended by: Gerald Stabs on: 01/13/2023 11:40 AM   Modules accepted: Orders

## 2023-01-13 NOTE — Telephone Encounter (Signed)
Pt c/o medication issue:  1. Name of Medication:   denosumab (PROLIA) 60 MG/ML SOSY injection   2. How are you currently taking this medication (dosage and times per day)?   3. Are you having a reaction (difficulty breathing--STAT)?   4. What is your medication issue?  Caller stated patient will need a prescription for this medication sent to establish service with them ([place] ).   Fax# [phone]

## 2023-01-16 ENCOUNTER — Ambulatory Visit: Payer: Federal, State, Local not specified - PPO | Attending: Audiologist | Admitting: Audiologist

## 2023-01-16 DIAGNOSIS — H903 Sensorineural hearing loss, bilateral: Secondary | ICD-10-CM | POA: Diagnosis present

## 2023-01-16 NOTE — Telephone Encounter (Signed)
Vml for aptient to call back and sch prolia

## 2023-01-16 NOTE — Telephone Encounter (Addendum)
SPECIALTY PHARMACY ([facility] [phone]) PA#  Valid: 12/03/22-01/02/24

## 2023-01-16 NOTE — Telephone Encounter (Signed)
PRIOR AUTH REQUIRED  Pt ready for scheduling on or after 01/01/23  Out-of-pocket cost due at time of visit: $460  Primary: [employer] FEP Prolia co-insurance: 30% (approx $435) Admin fee co-insurance: 30% (approx $25)  Deductible: does not apply  Secondary: Medicare Part A only Prolia co-insurance: not covered Admin fee co-insurance: not covered  Deductible: n/a  Prior Auth: Please ensure your patient meets medical necessity by reviewing Medical Policy FEP [email or web address] at [email or web address], and completing a pre-determination by contacting [place] [facility] [place] at [place]. PA# Valid:     ** This summary of benefits is an estimation of the patient's out-of-pocket cost. Exact cost may very based on individual plan coverage.

## 2023-01-16 NOTE — Telephone Encounter (Signed)
Per determination from [place] only valid through Specialty Pharmacy Part D benefits - see below. Preferred Specialty Pharmacy: [facility].   Otis Brace A, CPhT11 days ago    Pt ready for scheduling on or after 01/05/23   Out-of-pocket cost due at time of visit: $301   Primary: [employer] of [place] - FEP Prolia co-insurance: 30% (approximately $276) Admin fee co-insurance:30% (approximately $25)   Secondary: [place] Medicare Prolia co-insurance:  Admin fee co-insurance:    Deductible: $0   Prior Auth: approved PA# PA Case ID: [id number] Valid: 12/03/22 to 01/02/24   Per test claim, copay $255.00 through pharmacy benefits   ** This summary of benefits is an estimation of the patient's out-of-pocket cost. Exact cost may vary based on individual plan coverage.

## 2023-01-16 NOTE — Procedures (Signed)
  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 Christine Scott was accompanied to the appointment by her husband who stayed in the lobby. Christine Scott is receiving a hearing evaluation due to concerns for changes in her hearing. Christine Scott has hearing aids from [facility]. She was counseled on arrival to appointment and when scheduling that Christine Scott does not work with aids. Avleen still asked to have hearing test with Christine Scott. Christine Scott has difficulty hearing in crowds, the TV, and in restaurants. This difficulty began gradually. No pain or pressure reported in either ear. Tinnitus denied for both ears. Christine Scott has bilateral Resound hearing aids. She said she is turning up the aids to maximum volume to hear. However she is also wondering if using hearing aids is necessary. Medical history negative for a condition which is a risk factor for hearing loss. No other relevant case history reported.   Evaluation:  Otoscopy showed a clear view of the tympanic membranes, bilaterally Tympanometry results were consistent with normal middle ear function, bilaterally   Audiometric testing was completed using conventional audiometry with supraural transducer. Speech Recognition Thresholds were 45 dB in the right ear and 45 dB in the left ear. Word Recognition was performed 40dB SL, scored  84% in the right ear and 88% in the left ear. Pure tone thresholds show sloping mild to moderate sensorineural hearing loss bilaterally. Consistent with presbycusis.  Results:  The test results were reviewed with Christine Scott. The  nature and degree of loss was explained. She has a slight sloping to moderate sensorineural hearing loss in each ear. Her hearing is symmetric. She needs to be using her hearing aids daily. She reported understanding and asked that results be sent to her fitting audiologist Christine Scott.    Recommendations: Amplification is necessary for both ears. Christine Scott has hearing aids from [employer]. Recommend she follow up with them for care and use hearing aids daily. Copy of audiogram given to patient and faxed to [facility]. HIPAA release signed and scanned under media with audiogram.    25 minutes spent testing and counseling on results.   Christine Scott  Audiologist, Au.D., CCC-A 01/16/2023  10:30 AM  Cc: Tawnya Crook, MD

## 2023-01-19 ENCOUNTER — Ambulatory Visit: Payer: Federal, State, Local not specified - PPO | Admitting: Psychiatry

## 2023-01-19 ENCOUNTER — Encounter: Payer: Self-pay | Admitting: Psychiatry

## 2023-01-19 DIAGNOSIS — F3181 Bipolar II disorder: Secondary | ICD-10-CM | POA: Diagnosis not present

## 2023-01-19 DIAGNOSIS — F411 Generalized anxiety disorder: Secondary | ICD-10-CM

## 2023-01-19 DIAGNOSIS — F1020 Alcohol dependence, uncomplicated: Secondary | ICD-10-CM | POA: Diagnosis not present

## 2023-01-19 MED ORDER — ACAMPROSATE CALCIUM 333 MG PO TBEC: 666.0000 mg | DELAYED_RELEASE_TABLET | Freq: Three times a day (TID) | ORAL | 1 refills | Status: DC

## 2023-01-19 MED ORDER — NALTREXONE HCL 50 MG PO TABS: ORAL_TABLET | ORAL | 1 refills | Status: DC

## 2023-01-19 NOTE — Patient Instructions (Addendum)
Start Campral (accomprosate) 1 tablet 3 times daily for 4 days then 2 tablets 3 times daily. After 2 weeks if you need more help to stop drinking add 1/2 tablet of naltrexone at night
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
## 2023-01-26 ENCOUNTER — Telehealth: Payer: Self-pay | Admitting: Psychiatry

## 2023-01-26 NOTE — Telephone Encounter (Signed)
Christine Scott had a back injection at [facility] for pain. The doctor who gave it to her used Nelhyprednisol and used a new medication called acitate Campral. Also is taking Calcium Carb 300 mg and naxtrexone 50 mg. She wants to know if these are safe to get when she had this back injection. Her phone number is [phone]?

## 2023-01-26 NOTE — Telephone Encounter (Signed)
Please advise 

## 2023-01-26 NOTE — Telephone Encounter (Signed)
Yes it should be safe.  Steroid injection has a low risk of irritability and anxiety but it's low risk.

## 2023-01-27 NOTE — Telephone Encounter (Signed)
LVM with info and to rtc with any questions  

## 2023-02-01 NOTE — Telephone Encounter (Signed)
Per the [employer] portal, there is no coverage for Prolia through the patient's secondary plan [facility] due to only having Part A coverage.   She may be able to use the [employer] SupportPlus co-pay program to help lower her costs. The patient may enroll at [email or web address] or by phone at [phone].

## 2023-02-01 NOTE — Telephone Encounter (Signed)
Patient states she does not have $301 to pay OOP, However, she stated last time she had her prolia shot there was a miscommunication between insurance and prolia company. Patient stated after contacting insurance office should contact [place], and they [employer])  would provide with her OOP cost. Patient informed me she received her money back last time due to miscommunication.   Patient states she should not have to pay for this injection. Stated she never paid with Dr  in office.   Please review prolia case and advise next steps.

## 2023-02-01 NOTE — Telephone Encounter (Signed)
Pt informed of the [employer] co-pay card.  she alread enrolled for the last times, they gave her a card with her account; she is going to call them to renew her account and call us back to schedule her nurse visit for Prolia.

## 2023-02-01 NOTE — Telephone Encounter (Signed)
Per Brandy's note in previous encounters "  Out-of-pocket cost due at time of visit: $25 with [employer] COPAY ENROLLMENT $414 if NOT using copay [employer]gen copay card" I think this is what the patient id referring to; Would someone please help this pt getting the [place]?

## 2023-02-02 NOTE — Telephone Encounter (Signed)
Spoke to [place] with [employer] [phone]. Per Rebeka, Patient is awarded $1500 per calendar year for 2x prolia shots.  Patient has a DEBIT card with her that will be swiped.   Patients needs to have prolia shot in office, We have to send the bill to them with EOB. It takes 7/10 business days. Money will be loaded to the patients account. They will only pay for shot, not the admin fee of $25.00 of which patient agrees.  We do not charge patient upfront, it will be billed then paid a week after.   Please advise if rules are ok with [facility].

## 2023-02-07 NOTE — Telephone Encounter (Signed)
VML FOR PATIENT TO CALL BACK AND SPEAK WITH LUCIA   Spoke to [place] with [employer] [phone]. Kennyth Lose explained that PATIENT must receive Prolia shot before they make payments.   Patients insurances [employer] needs to be billed, once EOB is received, patient should call [employer] and they will advise patient how to upload EOB.   If patient is unable to upload EOB office should fax EOB to [phone].  VML FOR PATIENT TO CALL BACK AND SPEAK WITH LUCIA

## 2023-02-15 ENCOUNTER — Ambulatory Visit (INDEPENDENT_AMBULATORY_CARE_PROVIDER_SITE_OTHER): Payer: Federal, State, Local not specified - PPO

## 2023-02-15 DIAGNOSIS — M810 Age-related osteoporosis without current pathological fracture: Secondary | ICD-10-CM

## 2023-02-15 MED ORDER — DENOSUMAB 60 MG/ML ~~LOC~~ SOSY
60.0000 mg | PREFILLED_SYRINGE | Freq: Once | SUBCUTANEOUS | Status: AC
  Administered 2023-02-15: 60 mg via SUBCUTANEOUS

## 2023-02-15 NOTE — Progress Notes (Signed)
Pt received Prolia in left arm subcutaneously, pt tolerated well.

## 2023-02-15 NOTE — Telephone Encounter (Signed)
Patient called back and has been sch for prolia 02/15/23 at 2 pm - patient agrees to be billed for prolia then will bring EOB to office - Leda Quail will fax.

## 2023-03-01 ENCOUNTER — Ambulatory Visit: Payer: Federal, State, Local not specified - PPO | Admitting: Family Medicine

## 2023-03-15 ENCOUNTER — Ambulatory Visit: Payer: Federal, State, Local not specified - PPO | Admitting: Family Medicine

## 2023-03-22 ENCOUNTER — Ambulatory Visit: Payer: Medicare Other | Admitting: Psychiatry

## 2023-03-27 ENCOUNTER — Ambulatory Visit: Payer: Federal, State, Local not specified - PPO | Admitting: Physician Assistant

## 2023-03-27 ENCOUNTER — Encounter: Payer: Self-pay | Admitting: Physician Assistant

## 2023-03-27 VITALS — BP 120/70 | HR 87 | Temp 97.7°F | Ht 61.0 in | Wt 124.4 lb

## 2023-03-27 DIAGNOSIS — J029 Acute pharyngitis, unspecified: Secondary | ICD-10-CM | POA: Diagnosis not present

## 2023-03-27 DIAGNOSIS — B009 Herpesviral infection, unspecified: Secondary | ICD-10-CM | POA: Diagnosis not present

## 2023-03-27 LAB — POCT RAPID STREP A (OFFICE): Rapid Strep A Screen: POSITIVE — AB

## 2023-03-27 LAB — POC COVID19 BINAXNOW: SARS Coronavirus 2 Ag: NEGATIVE

## 2023-03-27 MED ORDER — AMOXICILLIN 500 MG PO CAPS: 500.0000 mg | ORAL_CAPSULE | Freq: Two times a day (BID) | ORAL | 0 refills | Status: AC

## 2023-03-27 MED ORDER — VALACYCLOVIR HCL 1 G PO TABS: ORAL_TABLET | ORAL | 2 refills | Status: DC

## 2023-03-27 NOTE — Patient Instructions (Addendum)
It was great to see you!  You have strep throat - please start amoxicillin antibiotic  I've also sent in Valtrex for your cold sores  Recommend trial 12-hour delsym over the counter cough syrup (generic is fine!)   Keep [place] posted on your symptoms!  Follow-up if any concerns.  Take care,  Inda Coke PA-C

## 2023-03-27 NOTE — Progress Notes (Signed)
Christine Scott is a 82 y.o. female here for a new problem.  History of Present Illness:   Chief Complaint  Patient presents with   Sore Throat    Pt c/o sore throat since last Wed, also having cough and expectorating sputum, headache off and on. Has been taking Tylenol.   fever blister    Pt c/o blisters on right side of upper and lower lip started on Friday.    HPI  Sore throat: She complains of a sore throat, productive cough, and congestion beginning last Wednesday.  She also endorses an on and off headache.  She does not believe her headache is a sinus headache.  Her cough has been waking her up at night.  She reports exposure while visiting family in [place]. She has been taking Tylenol. Denies fevers, chills, n/v/d.  Fever blisters: She has cold sores on her right side of upper and lower lip since Friday.  She has a previous hx which reoccurs when sick.   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 Trial of PT/injection. If doesn't do well good candidate for reverse shoulder arthoplasty. Continue with PT (12/19). No specific f/u was scheduled.    Osteoarthritis    hands   Osteoporosis    Polymyalgia rheumatica      Social History   Tobacco Use   Smoking status: Never   Smokeless tobacco: Never  Vaping Use   Vaping Use: Never used  Substance Use Topics   Alcohol use: Yes    Alcohol/week: 2.0 standard drinks of alcohol    Types: 2 Glasses of wine per week    Comment: sometimes a bottle of wine a week    Drug use: Never    Past Surgical History:  Procedure  Laterality Date   ABDOMINAL HYSTERECTOMY     BREAST SURGERY  2016   benign mass removed in [place], [place] ARTHROPLASTY Left 04/16/2020   Procedure: REVERSE SHOULDER ARTHROPLASTY;  Surgeon: Justice Britain, MD;  Location: [facility];  Service: Orthopedics;  Laterality: Left;  176min   TOTAL VAGINAL HYSTERECTOMY  age 71   --due to abnormal pap smears per patient    Family History  Problem Relation Age of Onset   Congestive Heart Failure Mother    Stroke Mother    Diabetes Father    Colon cancer Brother    Diabetes Brother    Diabetes Brother    Stroke Brother    Colon cancer Daughter     No Known Allergies  Current Medications:   Current Outpatient Medications:    acetaminophen (TYLENOL) 500 MG tablet, Take 1,000 mg by mouth at bedtime., Disp: , Rfl:    amLODipine (NORVASC) 10 MG tablet, Take 1 tablet (10 mg total) by mouth daily., Disp: 90 tablet, Rfl: 1   amoxicillin (AMOXIL) 500 MG capsule, Take 1 capsule (500 mg total) by mouth 2 (two) times daily for 10 days., Disp: 20 capsule, Rfl: 0   B Complex-Biotin-FA (  B-COMPLEX PO), Take 1 tablet by mouth daily. Super, Disp: , Rfl:    Calcium Carb-Cholecalciferol (CALCIUM CARBONATE-VITAMIN D3) 600-400 MG-UNIT TABS, Take 1 tablet by mouth daily. , Disp: , Rfl:    Cholecalciferol (VITAMIN D3) 50 MCG (2000 UT) TABS, Take 2,000 mg by mouth daily., Disp: , Rfl:    conjugated estrogens (PREMARIN) vaginal cream, Place 1/2 gram per vagina at bedtime twice weekly., Disp: 30 g, Rfl: 1   lamoTRIgine (LAMICTAL) 150 MG tablet, Take 1 tablet (150 mg total) by mouth daily., Disp: 90 tablet, Rfl: 1   Multiple Vitamins-Minerals (MULTIVITAMIN PO), Take 1 tablet by mouth daily. Centrum silver, Disp: , Rfl:    Omega-3 Fatty Acids (FISH OIL) 1000 MG CAPS, Take 1,000 mg by mouth daily. , Disp: , Rfl:    Polyethyl Glycol-Propyl Glycol (SYSTANE ULTRA OP), Place 1 drop into both eyes in the morning and at bedtime. ,  Disp: , Rfl:    valACYclovir (VALTREX) 1000 MG tablet, Take two tablets ( total 2000 mg) by mouth q12h x 1 day; Start: ASAP after symptom onset, Disp: 6 tablet, Rfl: 2   White Petrolatum-Mineral Oil (SYSTANE NIGHTTIME) OINT, Place 1 application into both eyes at bedtime as needed (Dry eye). , Disp: , Rfl:    losartan (COZAAR) 100 MG tablet, Take 1 tablet (100 mg total) by mouth daily. (Patient not taking: Reported on 03/27/2023), Disp: 90 tablet, Rfl: 1   naltrexone (DEPADE) 50 MG tablet, 1/2 tablet for 2 weeks then 1 daily (Patient not taking: Reported on 03/27/2023), Disp: 30 tablet, Rfl: 1   Review of Systems:   Review of Systems  HENT:  Positive for congestion and sore throat.   Respiratory:  Positive for cough (productive) and sputum production.   Neurological:  Positive for headaches (on and off).    Vitals:   Vitals:   03/27/23 1144  BP: 120/70  Pulse: 87  Temp: 97.7 F (36.5 C)  TempSrc: Temporal  SpO2: 97%  Weight: 124 lb 6.1 oz (56.4 kg)  Height: 5\' 1"  (1.549 m)     Body mass index is 23.5 kg/m.  Physical Exam:   Physical Exam Vitals and nursing note reviewed.  Constitutional:      General: She is not in acute distress.    Appearance: She is well-developed. She is not ill-appearing or toxic-appearing.  HENT:     Head: Normocephalic and atraumatic.     Right Ear: Tympanic membrane, ear canal and external ear normal. Tympanic membrane is not erythematous, retracted or bulging.     Left Ear: Tympanic membrane, ear canal and external ear normal. Tympanic membrane is not erythematous, retracted or bulging.     Nose: Nose normal.     Right Sinus: No maxillary sinus tenderness or frontal sinus tenderness.     Left Sinus: No maxillary sinus tenderness or frontal sinus tenderness.     Mouth/Throat:     Pharynx: Uvula midline. Posterior oropharyngeal erythema present.     Tonsils: No tonsillar exudate. 1+ on the right. 1+ on the left.  Eyes:     General: Lids are normal.      Conjunctiva/sclera: Conjunctivae normal.  Neck:     Trachea: Trachea normal.  Cardiovascular:     Rate and Rhythm: Normal rate and regular rhythm.     Heart sounds: Normal heart sounds, S1 normal and S2 normal.  Pulmonary:     Effort: Pulmonary effort is normal.     Breath sounds: Normal breath sounds. No decreased breath  sounds, wheezing, rhonchi or rales.  Lymphadenopathy:     Cervical: No cervical adenopathy.  Skin:    General: Skin is warm and dry.  Neurological:     Mental Status: She is alert.  Psychiatric:        Speech: Speech normal.        Behavior: Behavior normal. Behavior is cooperative.     Assessment and Plan:   Sore throat Strep test positive No red flags on exam.  Will initiate amoxicillin per orders. Discussed taking medications as prescribed. Reviewed return precautions including worsening fever, SOB, worsening cough or other concerns. Push fluids and rest. I recommend that patient follow-up if symptoms worsen or persist despite treatment x 7-10 days, sooner if needed.   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  I, Inda Coke, [place], have reviewed all documentation for this visit. The documentation on 03/27/23 for the exam, diagnosis, procedures, and orders are all accurate and complete.]  Inda Coke, PA-C

## 2023-03-30 ENCOUNTER — Ambulatory Visit (INDEPENDENT_AMBULATORY_CARE_PROVIDER_SITE_OTHER): Payer: Federal, State, Local not specified - PPO | Admitting: Family

## 2023-03-30 ENCOUNTER — Encounter: Payer: Self-pay | Admitting: Family

## 2023-03-30 VITALS — BP 166/73 | HR 72 | Temp 97.8°F | Ht 61.0 in | Wt 126.4 lb

## 2023-03-30 DIAGNOSIS — R053 Chronic cough: Secondary | ICD-10-CM

## 2023-03-30 MED ORDER — PREDNISONE 20 MG PO TABS: ORAL_TABLET | ORAL | 0 refills | Status: DC

## 2023-03-30 NOTE — Patient Instructions (Addendum)
It was very nice to see you today!    I have sent over prednisone you can start to help calm your cough. You can start today, and then take earlier in the morning the next 4 days Continue the Delsym cough syrup as needed. Take Tylenol for pain, sore throat, or fever. If sinus symptoms continue after you finish the prednisone, you can try over the counter generic Nasacort or Flonase, 1 squirt each nostril daily. Drink plenty of water!     PLEASE NOTE:  If you had any lab tests please let us know if you have not heard back within a few days. You may see your results on MyChart before we have a chance to review them but we will give you a call once they are reviewed by [place]. If we ordered any referrals today, please let us know if you have not heard from their office within the next week.

## 2023-03-30 NOTE — Progress Notes (Signed)
Patient ID: Christine Scott, female    DOB: June 07, 1941, 82 y.o.   MRN: [id number]  Chief Complaint  Patient presents with   Sore Throat    Dx with strep on 03/27/2023, Pt c/o still having a cough with no change, slight sore throat.    HPI:      Cough:  pt having URI sx starting last Wednesday, she had been flying back from visiting her sister,  dx w/strep throat on Monday and started on AMOX, and her throat is better, but her cough is still bad. She is taking Delsym syrup twice a day.       Assessment & Plan:  1. Persistent cough-  sending low dose prednisone, advised pt on use & SE, continue using Delsym, and lozenges. Take Tylenol for shoulder & back pain r/t coughing. If sx get better, but return, advised to start OTC generic Nasacort, 1 squirt ea nostril qd, use saline nasal spray prior to Nasacort and prn.  - predniSONE (DELTASONE) 20 MG tablet; Take 2 pills in the morning with breakfast for 3 days, then 1 pill for 2 days  Dispense: 8 tablet; Refill: 0  Subjective:    Outpatient Medications Prior to Visit  Medication Sig Dispense Refill   acetaminophen (TYLENOL) 500 MG tablet Take 1,000 mg by mouth at bedtime.     amLODipine (NORVASC) 10 MG tablet Take 1 tablet (10 mg total) by mouth daily. 90 tablet 1   amoxicillin (AMOXIL) 500 MG capsule Take 1 capsule (500 mg total) by mouth 2 (two) times daily for 10 days. 20 capsule 0   B Complex-Biotin-FA (B-COMPLEX PO) Take 1 tablet by mouth daily. Super     Calcium Carb-Cholecalciferol (CALCIUM CARBONATE-VITAMIN D3) 600-400 MG-UNIT TABS Take 1 tablet by mouth daily.      Cholecalciferol (VITAMIN D3) 50 MCG (2000 UT) TABS Take 2,000 mg by mouth daily.     conjugated estrogens (PREMARIN) vaginal cream Place 1/2 gram per vagina at bedtime twice weekly. 30 g 1   lamoTRIgine (LAMICTAL) 150 MG tablet Take 1 tablet (150 mg total) by mouth daily. 90 tablet 1   losartan (COZAAR) 100 MG tablet Take 1 tablet (100 mg total) by mouth daily. 90 tablet 1    Multiple Vitamins-Minerals (MULTIVITAMIN PO) Take 1 tablet by mouth daily. Centrum silver     naltrexone (DEPADE) 50 MG tablet 1/2 tablet for 2 weeks then 1 daily 30 tablet 1   Omega-3 Fatty Acids (FISH OIL) 1000 MG CAPS Take 1,000 mg by mouth daily.      Polyethyl Glycol-Propyl Glycol (SYSTANE ULTRA OP) Place 1 drop into both eyes in the morning and at bedtime.      valACYclovir (VALTREX) 1000 MG tablet Take two tablets ( total 2000 mg) by mouth q12h x 1 day; Start: ASAP after symptom onset 6 tablet 2   White Petrolatum-Mineral Oil (SYSTANE NIGHTTIME) OINT Place 1 application into both eyes at bedtime as needed (Dry eye).      No facility-administered medications prior to visit.   Past Medical History:  Diagnosis Date   Abnormal Pap smear of cervix    --hx abnormal paps in her 30s and per patient this was reason for her Hysterectomy   Anxiety    Bursitis 12/26/2020   Depression    Dry eye    Dyspnea on exertion 10/07/2020   Encounter for herpes zoster vaccination 10/18/2021   History of depression 08/12/2017   Hypertension    Left rotator cuff tear arthropathy  12/06/2018   [facility]. Trial of PT/injection. If doesn't do well good candidate for reverse shoulder arthoplasty. Continue with PT (12/19). No specific f/u was scheduled.    Osteoarthritis    hands   Osteoporosis    Polymyalgia rheumatica    Past Surgical History:  Procedure Laterality Date   ABDOMINAL HYSTERECTOMY     BREAST SURGERY  2016   benign mass removed in [place], [place] ARTHROPLASTY Left 04/16/2020   Procedure: REVERSE SHOULDER ARTHROPLASTY;  Surgeon: Justice Britain, MD;  Location: [facility];  Service: Orthopedics;  Laterality: Left;  164min   TOTAL VAGINAL HYSTERECTOMY  age 59   --due to abnormal pap smears per patient   No Known Allergies    Objective:    Physical Exam Vitals and nursing note reviewed.  Constitutional:       Appearance: Normal appearance. She is not ill-appearing.     Interventions: Face mask in place.  HENT:     Right Ear: Tympanic membrane and ear canal normal.     Left Ear: Tympanic membrane and ear canal normal.     Nose:     Right Sinus: No frontal sinus tenderness.     Left Sinus: No frontal sinus tenderness.     Mouth/Throat:     Mouth: Mucous membranes are moist.     Pharynx: Posterior oropharyngeal erythema present. No pharyngeal swelling, oropharyngeal exudate or uvula swelling.     Tonsils: No tonsillar exudate or tonsillar abscesses.  Cardiovascular:     Rate and Rhythm: Normal rate and regular rhythm.  Pulmonary:     Effort: Pulmonary effort is normal.     Breath sounds: Normal breath sounds.  Musculoskeletal:        General: Normal range of motion.  Lymphadenopathy:     Head:     Right side of head: No preauricular or posterior auricular adenopathy.     Left side of head: No preauricular or posterior auricular adenopathy.     Cervical: No cervical adenopathy.  Skin:    General: Skin is warm and dry.  Neurological:     Mental Status: She is alert.  Psychiatric:        Mood and Affect: Mood normal.        Behavior: Behavior normal.    BP (!) 166/73 (BP Location: Left Arm, Patient Position: Sitting, Cuff Size: Large)   Pulse 72   Temp 97.8 F (36.6 C) (Temporal)   Ht 5\' 1"  (1.549 m)   Wt 126 lb 6 oz (57.3 kg)   LMP  (LMP Unknown)   SpO2 96%   BMI 23.88 kg/m  Wt Readings from Last 3 Encounters:  03/30/23 126 lb 6 oz (57.3 kg)  03/27/23 124 lb 6.1 oz (56.4 kg)  12/16/22 129 lb 8 oz (58.7 kg)      Jeanie Sewer, NP

## 2023-04-10 NOTE — Telephone Encounter (Signed)
Patient walked in and provided EOB.  EOB/ Prolia injection DOS and Notes have been faxed to [employer] at [phone]. Confirmation received.   [employer] to load $452.34 to patients patient assistance visa card.   Should take aprox 7 business days.

## 2023-04-10 NOTE — Telephone Encounter (Signed)
Confirmation fax received. 

## 2023-04-18 NOTE — Progress Notes (Signed)
Office Visit Note  Patient: Christine Scott             Date of Birth: Sep 29, 1941           MRN: [id number]             PCP: [name], MD Referring: [name], MD Visit Date: 05/02/2023 Occupation: @GUAROCC @  Subjective:  Lower back pain  History of Present Illness: Christine Scott is a 82 y.o. female with history of polymyalgia rheumatica, osteoarthritis, degenerative disc disease and osteoporosis.  She returns today after her last visit in October 2022.  2 weeks ago she started having pain and discomfort in the left hip which radiates into her groin.  She states the pain has lingered for the last 2 weeks.  She continues to have some neck and lower back pain.  She states her hands are not painful but she continues to have discomfort in her feet.  Left shoulder replacement is doing well.  She continues to have dry mouth and dry eyes.  She was evaluated by pulmonologist in the past for shortness of breath and the workup was negative.  She continues to be on Prolia injections for osteoporosis.  She states that her gait is not stable and she is concerned about falling.    Activities of Daily Living:  Patient reports morning stiffness for 10-15 minutes.   Patient Denies nocturnal pain.  Difficulty dressing/grooming: Denies Difficulty climbing stairs: Denies Difficulty getting out of chair: Denies Difficulty using hands for taps, buttons, cutlery, and/or writing: Denies  Review of Systems  Constitutional:  Negative for fatigue.  HENT:  Positive for mouth dryness. Negative for mouth sores.   Eyes:  Positive for dryness.  Respiratory:  Negative for shortness of breath.   Cardiovascular:  Positive for swelling in legs/feet. Negative for chest pain and palpitations.  Gastrointestinal:  Negative for blood in stool, constipation and diarrhea.  Endocrine: Negative for increased urination.  Genitourinary:  Negative for involuntary urination.  Musculoskeletal:  Positive for joint  pain, joint pain, myalgias, morning stiffness and myalgias. Negative for gait problem, joint swelling, muscle weakness and muscle tenderness.  Skin:  Negative for color change, rash, hair loss and sensitivity to sunlight.  Allergic/Immunologic: Negative for susceptible to infections.  Neurological:  Positive for headaches. Negative for dizziness.  Hematological:  Negative for swollen glands.  Psychiatric/Behavioral:  Negative for depressed mood and sleep disturbance. The patient is not nervous/anxious.     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   Greater trochanteric bursitis of left hip 07/04/2019   DDD (degenerative disc disease), lumbar 02/25/2019   Bipolar II disorder (HCC) 12/07/2018   Essential hypertension 08/12/2017   Lumbar radiculopathy 06/15/2017   Sjogren's syndrome 12/25/2016   Polymyalgia rheumatica (HCC) 12/25/2016   Primary osteoarthritis of both hands 12/25/2016   Primary osteoarthritis of both feet 12/25/2016   Osteoporosis 12/25/2016    Past Medical History:  Diagnosis Date   Abnormal Pap smear of cervix    --hx abnormal paps in her 30s and per patient this was reason for her Hysterectomy   Anxiety    Bursitis 12/26/2020   Depression    Dry eye    Dyspnea on exertion 10/07/2020   Encounter for herpes zoster vaccination 10/18/2021   History of depression 08/12/2017   Hypertension    Left rotator cuff tear arthropathy  12/06/2018   [facility]. Trial of PT/injection. If doesn't do well good candidate for reverse shoulder arthoplasty. Continue with PT (12/19). No specific f/u was scheduled.    Osteoarthritis    hands   Osteoporosis    Polymyalgia rheumatica (HCC)     Family History  Problem Relation Age of Onset   Congestive Heart Failure Mother    Stroke Mother    Diabetes Father    Colon cancer Brother     Diabetes Brother    Diabetes Brother    Stroke Brother    Colon cancer Daughter    Past Surgical History:  Procedure Laterality Date   ABDOMINAL HYSTERECTOMY     BREAST SURGERY  2016   benign mass removed in [place], [place].   FACIAL COSMETIC SURGERY     REVERSE SHOULDER ARTHROPLASTY Left 04/16/2020   Procedure: REVERSE SHOULDER ARTHROPLASTY;  Surgeon: Francena Hanly, MD;  Location: [facility];  Service: Orthopedics;  Laterality: Left;    TOTAL VAGINAL HYSTERECTOMY  age 67   --due to abnormal pap smears per patient   Social History   Social History Narrative   Right handed   Lives alone   Immunization History  Administered Date(s) Administered   COVID-19, mRNA, vaccine(Comirnaty)12 years and older 10/07/2022   Fluad Quad(high Dose 65+) 08/28/2019, 11/24/2020, 09/09/2021   Influenza, High Dose Seasonal PF 10/21/2017, 11/27/2018, 09/15/2022   Influenza-Unspecified 10/13/2016   PFIZER Comirnaty(Gray Top)Covid-19 Tri-Sucrose Vaccine 05/31/2021   PFIZER(Purple Top)SARS-COV-2 Vaccination 01/12/2020, 02/05/2020, 11/07/2020   Pfizer Covid-19 Vaccine Bivalent Booster 27yrs & up 11/24/2021   Pneumococcal Conjugate-13 08/17/2018   Pneumococcal Polysaccharide-23 01/19/2016   Tdap 12/26/2009, 10/18/2021   Zoster Recombinat (Shingrix) 10/18/2021, 09/09/2022     Objective: Vital Signs: BP 109/63 (BP Location: Left Arm, Patient Position: Sitting, Cuff Size: Normal)   Pulse 76   Resp 12   Ht 5\' 3"  (1.6 m)   Wt 129 lb 3.2 oz (58.6 kg)   LMP  (LMP Unknown)   BMI 22.89 kg/m    Physical Exam Vitals and nursing note reviewed.  Constitutional:      Appearance: She is well-developed.  HENT:     Head: Normocephalic and atraumatic.  Eyes:     Conjunctiva/sclera: Conjunctivae normal.  Cardiovascular:     Rate and Rhythm: Normal rate and regular rhythm.     Heart sounds: Normal heart sounds.  Pulmonary:     Effort: Pulmonary effort is normal.     Breath sounds: Normal breath  sounds.  Abdominal:     General: Bowel sounds are normal.     Palpations: Abdomen is soft.  Musculoskeletal:     Cervical back: Normal range of motion.  Lymphadenopathy:     Cervical: No cervical adenopathy.  Skin:    General: Skin is warm and dry.     Capillary Refill: Capillary refill takes less than 2 seconds.  Neurological:     Mental Status: She is alert and oriented to person, place, and time.  Psychiatric:        Behavior: Behavior normal.      Musculoskeletal Exam: She had limitation with range of motion cervical spine.  She had limited painful range of motion of the lumbar spine with some tenderness in the lower lumbar region.  She has some tenderness over left SI joint.  She had tenderness over left trochanteric bursa.  Left shoulder joint was replaced and had limited internal rotation.  Right shoulder joint was in full range  of motion.  Elbow joints and wrist joints in good range of motion.  She had bilateral PIP and DIP thickening consistent with osteoarthritis.  No synovitis was noted.  Hip joints and knee joints in good range of motion.  She had no tenderness over ankles or MTPs.  CDAI Exam: CDAI Score: -- Patient Global: --; Provider Global: -- Swollen: --; Tender: -- Joint Exam 05/02/2023   No joint exam has been documented for this visit   There is currently no information documented on the homunculus. Go to the Rheumatology activity and complete the homunculus joint exam.  Investigation: No additional findings.  Imaging: No results found.  Recent Labs: Lab Results  Component Value Date   WBC 6.5 08/10/2022   HGB 11.2 (L) 08/10/2022   PLT 218.0 08/10/2022   NA 134 (L) 08/10/2022   K 4.6 08/10/2022   CL 99 08/10/2022   CO2 24 08/10/2022   GLUCOSE 100 (H) 08/10/2022   BUN 22 08/10/2022   CREATININE 0.87 08/10/2022   BILITOT 0.4 04/15/2021   ALKPHOS 58 04/15/2021   AST 20 04/15/2021   ALT 11 04/15/2021   PROT 7.3 04/15/2021   ALBUMIN 4.1 04/15/2021    CALCIUM 9.5 08/10/2022   GFRAA >60 04/13/2020    Speciality Comments: Osteoporosis managed by PCP.  Procedures:  No procedures performed Allergies: Patient has no known allergies.   Assessment / Plan:     Visit Diagnoses: Polymyalgia rheumatica (HCC) -patient had no muscular weakness or tenderness on the examination today.  Her PMR is under remission.  Sjogren's syndrome -history of positive ANA, positive SSA antibody and hypocomplementemia.  She continues to have sicca symptoms.  Over-the-counter products were discussed at length.  There is no history of lymphadenopathy or shortness of breath.  Patient had pulmonary evaluation in the past which was unremarkable.  Increased risk of ILD and lymphoma with Sjogren's was discussed.  Patient was advised to contact us if she develops any new symptoms.  Will obtain following labs today.  Plan: CBC with Differential/Platelet, COMPLETE METABOLIC PANEL WITH GFR, ANA, Sjogrens syndrome-A extractable nuclear antibody, C3 and C4, Rheumatoid factor, Serum protein electrophoresis with reflex, Sedimentation rate.  Will contact her once the lab results are available.  History of left shoulder replacement -she denies any discomfort today.  She had limited internal rotation.  She had reverse shoulder arthroplasty by Dr. Rennis Chris in April 2021.  Primary osteoarthritis of both hands-she has bilateral PIP and DIP thickening.  Joint protection muscle strengthening was discussed.  Trochanteric bursitis, left hip -she had tenderness on palpation over left trochanteric bursa.  She declined cortisone injection.  I will refer her to physical therapy.  IT band stretches were demonstrated in the office today.  Plan: Ambulatory referral to Physical Therapy  Primary osteoarthritis of both feet-she continues to have pain and discomfort in her feet.  She had bilateral PIP and DIP thickening with no synovitis.  Proper fitting shoes with arch support were advised.  DDD  (degenerative disc disease), cervical-she has limited range of motion without any discomfort.  DDD (degenerative disc disease), lumbar -she has been experiencing lower back pain.  She also has lumbar scoliosis.  I will refer her to physical therapy.  Plan: Ambulatory referral to Physical Therapy  Osteoporosis - She is on Prolia. Her osteoporosis has been managed by her PCP.  Unstable gait -patient gives history of gait instability.  She states she has fear of falling.  I will refer her to physical therapy for lower  extremity muscle strengthening.  Plan: Ambulatory referral to Physical Therapy  History of hypertension-blood pressure was normal at 109/63 today.  History of depression  Memory loss - referred to neuro in the past.  Orders: Orders Placed This Encounter  Procedures   CBC with Differential/Platelet   COMPLETE METABOLIC PANEL WITH GFR   ANA   Sjogrens syndrome-A extractable nuclear antibody   C3 and C4   Rheumatoid factor   Serum protein electrophoresis with reflex   Sedimentation rate   Ambulatory referral to Physical Therapy   No orders of the defined types were placed in this encounter.   Follow-Up Instructions: Return in about 6 months (around 11/02/2023), or if symptoms worsen or fail to improve, for PMR, OA, OP.   Pollyann Savoy, MD  Note - This record has been created using Animal nutritionist.  Chart creation errors have been sought, but may not always  have been located. Such creation errors do not reflect on  the standard of medical care.

## 2023-04-29 ENCOUNTER — Other Ambulatory Visit: Payer: Self-pay | Admitting: Family Medicine

## 2023-05-01 NOTE — Telephone Encounter (Signed)
Unable to reach pt , lvm requesting she cb to get scheduled for a ov

## 2023-05-02 ENCOUNTER — Ambulatory Visit: Payer: Federal, State, Local not specified - PPO | Attending: Rheumatology | Admitting: Rheumatology

## 2023-05-02 ENCOUNTER — Encounter: Payer: Self-pay | Admitting: Rheumatology

## 2023-05-02 VITALS — BP 109/63 | HR 76 | Resp 12 | Ht 63.0 in | Wt 129.2 lb

## 2023-05-02 DIAGNOSIS — M3501 Sicca syndrome with keratoconjunctivitis: Secondary | ICD-10-CM | POA: Diagnosis not present

## 2023-05-02 DIAGNOSIS — M19071 Primary osteoarthritis, right ankle and foot: Secondary | ICD-10-CM

## 2023-05-02 DIAGNOSIS — M353 Polymyalgia rheumatica: Secondary | ICD-10-CM | POA: Diagnosis not present

## 2023-05-02 DIAGNOSIS — M19041 Primary osteoarthritis, right hand: Secondary | ICD-10-CM | POA: Diagnosis not present

## 2023-05-02 DIAGNOSIS — M810 Age-related osteoporosis without current pathological fracture: Secondary | ICD-10-CM

## 2023-05-02 DIAGNOSIS — M19042 Primary osteoarthritis, left hand: Secondary | ICD-10-CM

## 2023-05-02 DIAGNOSIS — Z8679 Personal history of other diseases of the circulatory system: Secondary | ICD-10-CM

## 2023-05-02 DIAGNOSIS — R2681 Unsteadiness on feet: Secondary | ICD-10-CM

## 2023-05-02 DIAGNOSIS — M5030 Other cervical disc degeneration, unspecified cervical region: Secondary | ICD-10-CM

## 2023-05-02 DIAGNOSIS — R413 Other amnesia: Secondary | ICD-10-CM

## 2023-05-02 DIAGNOSIS — Z8659 Personal history of other mental and behavioral disorders: Secondary | ICD-10-CM

## 2023-05-02 DIAGNOSIS — M7062 Trochanteric bursitis, left hip: Secondary | ICD-10-CM

## 2023-05-02 DIAGNOSIS — R0602 Shortness of breath: Secondary | ICD-10-CM

## 2023-05-02 DIAGNOSIS — M19072 Primary osteoarthritis, left ankle and foot: Secondary | ICD-10-CM

## 2023-05-02 DIAGNOSIS — M51369 Other intervertebral disc degeneration, lumbar region without mention of lumbar back pain or lower extremity pain: Secondary | ICD-10-CM

## 2023-05-02 DIAGNOSIS — Z96612 Presence of left artificial shoulder joint: Secondary | ICD-10-CM

## 2023-05-02 DIAGNOSIS — M5136 Other intervertebral disc degeneration, lumbar region: Secondary | ICD-10-CM

## 2023-05-03 ENCOUNTER — Ambulatory Visit: Payer: Federal, State, Local not specified - PPO | Admitting: Family Medicine

## 2023-05-05 LAB — PROTEIN ELECTROPHORESIS, SERUM, WITH REFLEX
Albumin ELP: 3.9 g/dL (ref 3.8–4.8)
Alpha 1: 0.3 g/dL (ref 0.2–0.3)
Alpha 2: 0.7 g/dL (ref 0.5–0.9)
Beta 2: 0.3 g/dL (ref 0.2–0.5)
Beta Globulin: 0.4 g/dL (ref 0.4–0.6)
Gamma Globulin: 0.8 g/dL (ref 0.8–1.7)
Total Protein: 6.4 g/dL (ref 6.1–8.1)

## 2023-05-05 LAB — COMPLETE METABOLIC PANEL WITHOUT GFR
AG Ratio: 1.6 (calc) (ref 1.0–2.5)
ALT: 8 U/L (ref 6–29)
AST: 15 U/L (ref 10–35)
Albumin: 3.9 g/dL (ref 3.6–5.1)
Alkaline phosphatase (APISO): 47 U/L (ref 37–153)
BUN: 25 mg/dL (ref 7–25)
CO2: 27 mmol/L (ref 20–32)
Calcium: 9.1 mg/dL (ref 8.6–10.4)
Chloride: 105 mmol/L (ref 98–110)
Creat: 0.88 mg/dL (ref 0.60–0.95)
Globulin: 2.4 g/dL (ref 1.9–3.7)
Glucose, Bld: 137 mg/dL — ABNORMAL HIGH (ref 65–99)
Potassium: 4.4 mmol/L (ref 3.5–5.3)
Sodium: 140 mmol/L (ref 135–146)
Total Bilirubin: 0.3 mg/dL (ref 0.2–1.2)
Total Protein: 6.3 g/dL (ref 6.1–8.1)
eGFR: 65 mL/min/1.73m2

## 2023-05-05 LAB — CBC WITH DIFFERENTIAL/PLATELET
Absolute Monocytes: 631 {cells}/uL (ref 200–950)
Basophils Absolute: 39 {cells}/uL (ref 0–200)
Basophils Relative: 0.6 %
Eosinophils Absolute: 130 {cells}/uL (ref 15–500)
Eosinophils Relative: 2 %
HCT: 32.6 % — ABNORMAL LOW (ref 35.0–45.0)
Hemoglobin: 10.7 g/dL — ABNORMAL LOW (ref 11.7–15.5)
Lymphs Abs: 1365 {cells}/uL (ref 850–3900)
MCH: 29.8 pg (ref 27.0–33.0)
MCHC: 32.8 g/dL (ref 32.0–36.0)
MCV: 90.8 fL (ref 80.0–100.0)
MPV: 11.1 fL (ref 7.5–12.5)
Monocytes Relative: 9.7 %
Neutro Abs: 4336 {cells}/uL (ref 1500–7800)
Neutrophils Relative %: 66.7 %
Platelets: 228 Thousand/uL (ref 140–400)
RBC: 3.59 Million/uL — ABNORMAL LOW (ref 3.80–5.10)
RDW: 12.8 % (ref 11.0–15.0)
Total Lymphocyte: 21 %
WBC: 6.5 Thousand/uL (ref 3.8–10.8)

## 2023-05-05 LAB — ANTI-NUCLEAR AB-TITER (ANA TITER): ANA Titer 1: 1:40 {titer} — ABNORMAL HIGH

## 2023-05-05 LAB — C3 AND C4
C3 Complement: 62 mg/dL
C4 Complement: 22 mg/dL

## 2023-05-05 LAB — SJOGRENS SYNDROME-A EXTRACTABLE NUCLEAR ANTIBODY: SSA (Ro) (ENA) Antibody, IgG: 5.5 AI — AB

## 2023-05-05 LAB — ANA: Anti Nuclear Antibody (ANA): POSITIVE — AB

## 2023-05-05 LAB — SEDIMENTATION RATE: Sed Rate: 14 mm/h (ref 0–30)

## 2023-05-05 LAB — RHEUMATOID FACTOR: Rheumatoid fact SerPl-aCnc: <10 IU/mL (ref <14)

## 2023-05-07 NOTE — Progress Notes (Signed)
Hemoglobin is low.  CMP is normal.  Patient should take multivitamin with iron.  ANA positive, SSA antibody Sjogren's positive, RF negative, complements normal, sed rate normal, SPEP normal.  Labs are stable.  No change in treatment advised.

## 2023-05-12 ENCOUNTER — Telehealth: Payer: Self-pay | Admitting: Rheumatology

## 2023-05-12 NOTE — Telephone Encounter (Signed)
I called patient, referral sent to [facility].

## 2023-05-12 NOTE — Telephone Encounter (Signed)
Patient left a voicemail stating Dr. Corliss Skains referred her for PT at [facility] on [address] and that is too far from her home.  Patient requested a referral to [facility].

## 2023-05-15 ENCOUNTER — Telehealth: Payer: Self-pay | Admitting: Rheumatology

## 2023-05-15 NOTE — Telephone Encounter (Signed)
[facility] referral faxed to [facility] 05/12/2023.

## 2023-05-15 NOTE — Telephone Encounter (Signed)
Christine Scott called requesting her referral be redirected to [name] [place]. She stated the previous location is too far from her house.

## 2023-05-16 ENCOUNTER — Telehealth: Payer: Self-pay

## 2023-05-16 ENCOUNTER — Ambulatory Visit: Payer: Federal, State, Local not specified - PPO | Admitting: Physician Assistant

## 2023-05-16 ENCOUNTER — Encounter: Payer: Self-pay | Admitting: Physician Assistant

## 2023-05-16 VITALS — BP 174/82 | HR 90 | Temp 98.4°F | Resp 16 | Ht 60.0 in | Wt 126.2 lb

## 2023-05-16 DIAGNOSIS — J020 Streptococcal pharyngitis: Secondary | ICD-10-CM | POA: Diagnosis not present

## 2023-05-16 DIAGNOSIS — I10 Essential (primary) hypertension: Secondary | ICD-10-CM

## 2023-05-16 LAB — POCT RAPID STREP A (OFFICE): Rapid Strep A Screen: POSITIVE — AB

## 2023-05-16 MED ORDER — CEPHALEXIN 500 MG PO CAPS: 500.0000 mg | ORAL_CAPSULE | Freq: Two times a day (BID) | ORAL | 0 refills | Status: AC

## 2023-05-16 NOTE — Assessment & Plan Note (Signed)
Chronic, elevated today She is taking Amlodipine 10 mg and losartan 100 mg daily Asked her to monitor at home & f/up with PCP in a few weeks to adjust medications if necessary.

## 2023-05-16 NOTE — Telephone Encounter (Signed)
Patient states she is wanting to do PT for her back pain. Was suggested to her by a provider at [facility], [place].

## 2023-05-16 NOTE — Patient Instructions (Addendum)
Good to see you today. Your strep throat test was positive. Take the cephalexin twice daily for full 10 days. Change toothbrush at end of treatment.   Your blood pressure was elevated today. Please continue to take your medications as directed. Monitor at home. Follow up with Dr. Ruthine Dose in a few weeks to recheck blood pressure.

## 2023-05-16 NOTE — Progress Notes (Signed)
Subjective:    Patient ID: Christine Scott, female    DOB: Jul 31, 1941, 82 y.o.   MRN: [id number]  Chief Complaint  Patient presents with   Sore Throat    X 3 days     Sore Throat    Patient is in today for sore throat x 3 days. Feels tender and sore in neck near tonsils. No fever or chills. No body aches. No other symptoms. Tested positive for strep throat last month and treated with amoxicillin; symptoms resolved at that time.    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 Trial of PT/injection. If doesn't do well good candidate for reverse shoulder arthoplasty. Continue with PT (12/19). No specific f/u was scheduled.    Osteoarthritis    hands   Osteoporosis    Polymyalgia rheumatica (HCC)     Past Surgical History:  Procedure Laterality Date   ABDOMINAL HYSTERECTOMY     BREAST SURGERY  2016   benign mass removed in [place], [place].   FACIAL COSMETIC SURGERY     REVERSE SHOULDER ARTHROPLASTY Left 04/16/2020   Procedure: REVERSE SHOULDER ARTHROPLASTY;  Surgeon: Francena Hanly, MD;  Location: [facility];  Service: Orthopedics;  Laterality: Left;    TOTAL VAGINAL HYSTERECTOMY  age 69   --due to abnormal pap smears per patient    Family History  Problem Relation Age of Onset   Congestive Heart Failure Mother    Stroke Mother    Diabetes Father    Colon cancer Brother    Diabetes Brother    Diabetes Brother    Stroke Brother    Colon cancer Daughter     Social History   Tobacco Use   Smoking status: Never    Passive exposure: Never   Smokeless tobacco: Never   Vaping Use   Vaping Use: Never used  Substance Use Topics   Alcohol use: Yes    Alcohol/week: 2.0 standard drinks of alcohol    Types: 2 Glasses of wine per week    Comment: wine occ   Drug use: Never     No Known Allergies  Review of Systems NEGATIVE UNLESS OTHERWISE INDICATED IN HPI      Objective:     BP (!) 174/82   Pulse 90   Temp 98.4 F (36.9 C) (Temporal)   Resp 16   Ht 5' (1.524 m)   Wt 126 lb 3.2 oz (57.2 kg)   LMP  (LMP Unknown)   SpO2 97%   BMI 24.65 kg/m   Wt Readings from Last 3 Encounters:  05/16/23 126 lb 3.2 oz (57.2 kg)  05/02/23 129 lb 3.2 oz (58.6 kg)  03/30/23 126 lb 6 oz (57.3 kg)    BP Readings from Last 3 Encounters:  05/16/23 (!) 174/82  05/02/23 109/63  03/30/23 (!) 166/73     Physical Exam Vitals and nursing note reviewed.  Constitutional:      General: She is not in acute distress.    Appearance: Normal appearance. She is not ill-appearing.  HENT:     Head: Normocephalic.     Right Ear: Tympanic membrane, ear canal and external ear normal.     Left Ear: Tympanic membrane, ear canal and external ear normal.     Nose: No congestion or rhinorrhea.     Mouth/Throat:     Mouth: Mucous membranes are moist. No oral lesions.     Pharynx: Uvula midline. Posterior oropharyngeal erythema present. No pharyngeal swelling or oropharyngeal exudate.  Eyes:     Extraocular Movements: Extraocular movements intact.     Conjunctiva/sclera: Conjunctivae normal.     Pupils: Pupils are equal, round, and reactive to light.  Cardiovascular:     Rate and Rhythm: Normal rate and regular rhythm.     Pulses: Normal pulses.     Heart sounds: Normal heart sounds. No murmur heard. Pulmonary:     Effort: Pulmonary effort is normal. No respiratory distress.     Breath sounds: Normal breath sounds. No wheezing.  Musculoskeletal:     Cervical back: Normal range of motion.  Skin:    General: Skin is warm.  Neurological:     Mental Status: She is alert  and oriented to person, place, and time.  Psychiatric:        Mood and Affect: Mood normal.        Behavior: Behavior normal.        Assessment & Plan:  Strep pharyngitis -     POCT rapid strep A -     Cephalexin; Take 1 capsule (500 mg total) by mouth 2 (two) times daily for 10 days.  Dispense: 20 capsule; Refill: 0  Essential hypertension Assessment & Plan: Chronic, elevated today She is taking Amlodipine 10 mg and losartan 100 mg daily Asked her to monitor at home & f/up with PCP in a few weeks to adjust medications if necessary.         Return in about 2 weeks (around 05/30/2023) for blood pressure check with Dr. Ruthine Dose.    Estefania Kamiya M Theophil Thivierge, PA-C

## 2023-05-16 NOTE — Telephone Encounter (Signed)
Patient is requesting referral to [facility] at [facility] for PT. OK to place referral? Please advise

## 2023-05-17 ENCOUNTER — Other Ambulatory Visit: Payer: Self-pay

## 2023-05-17 DIAGNOSIS — G8929 Other chronic pain: Secondary | ICD-10-CM

## 2023-05-17 NOTE — Telephone Encounter (Signed)
Patient states she received bill in mail for $452.34 from her prolia. States she spoke with [employer] who told her that they didn't receive the EOB needed to load her debit card. I informed patient that we did fax it over on 4/15 and she verbalized understanding.   States she called billing and explained the issue but they were brief with her. She wants to know if we could call billing or [employer] since she is not responsible for the bill. Annette Stable has gone to collections.

## 2023-05-17 NOTE — Telephone Encounter (Signed)
Ref to PT with [facility] at [facility] placed.

## 2023-05-31 ENCOUNTER — Ambulatory Visit: Payer: Federal, State, Local not specified - PPO | Admitting: Physical Therapy

## 2023-05-31 NOTE — Telephone Encounter (Signed)
Called pt to check if she got any information from her insurance. She stated that she didn't and that last thing she heard that we need to fax EOB again to [employer].  Called [employer] and spoke with [facility], she stated that the EOB they received didn't have the insurance information. She asked to fax itemized EOB. She also confirmed that pt should of received the card information in the mail. Faxed Itemized EOB to number provided by [employer] [phone]. Nea explained that we have to call them back in 3-5 business days to check on the status of this claim. Called pt back and informed her with updates above. Asked her if she ever received the card, she said yes she did. I asked her to keep hold of the card and bring it to the office to process the payment when we hear back from [employer].  *Patient's [employer] ID: [id number]

## 2023-06-07 ENCOUNTER — Encounter: Payer: Self-pay | Admitting: Family Medicine

## 2023-06-07 ENCOUNTER — Ambulatory Visit: Payer: Federal, State, Local not specified - PPO | Admitting: Family Medicine

## 2023-06-07 VITALS — BP 130/62 | HR 72 | Temp 98.1°F | Resp 16 | Ht 60.0 in | Wt 126.5 lb

## 2023-06-07 DIAGNOSIS — M5416 Radiculopathy, lumbar region: Secondary | ICD-10-CM | POA: Diagnosis not present

## 2023-06-07 DIAGNOSIS — D649 Anemia, unspecified: Secondary | ICD-10-CM | POA: Diagnosis not present

## 2023-06-07 DIAGNOSIS — I10 Essential (primary) hypertension: Secondary | ICD-10-CM

## 2023-06-07 DIAGNOSIS — K130 Diseases of lips: Secondary | ICD-10-CM | POA: Diagnosis not present

## 2023-06-07 NOTE — Progress Notes (Signed)
Subjective:     Patient ID: Christine Scott, female    DOB: 09-10-1941, 82 y.o.   MRN: [id number]  Chief Complaint  Patient presents with   Medical Management of Chronic Issues    Follow-up on blood pressure    Abdominal Pain    Both sides of stomach and lower back pain for 5 or 6 days   Burning with urination    Started 5 or 6 days ago     HPI  HTN--Pt is on amlodipine 10mg ,not taking Losartan 100mg .  Bp's running past 2 weeks(quit checking prior). 135-170's/70-80.  No ha/dizziness/cp/palp/cough/sob x dyspnea on exertion at times(old).  Occasional edema. Some stress Some LBP but better(was few days ago) and lower abdomen pain.  Occasional dysuria. Some increase frequency.  Anemia-from labs few weeks ago-told to take MVI but constipation so stopped. No vag bleeding. Stools can be dark occasional.   Dry lips.  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 Trial of PT/injection. If doesn't do well good candidate for reverse shoulder arthoplasty. Continue with PT (12/19). No specific f/u was scheduled.    Osteoarthritis    hands   Osteoporosis    Polymyalgia rheumatica (HCC)     Past Surgical History:  Procedure Laterality Date   ABDOMINAL HYSTERECTOMY     BREAST SURGERY  2016   benign mass removed in [place], [place].   FACIAL COSMETIC SURGERY     REVERSE SHOULDER ARTHROPLASTY Left 04/16/2020   Procedure: REVERSE SHOULDER ARTHROPLASTY;  Surgeon: Francena Hanly, MD;  Location: [facility];  Service:  Orthopedics;  Laterality: Left;    TOTAL VAGINAL HYSTERECTOMY  age 55   --due to abnormal pap smears per patient     Current Outpatient Medications:    acetaminophen (TYLENOL) 500 MG tablet, Take 1,000 mg by mouth at bedtime., Disp: , Rfl:    amLODipine (NORVASC) 10 MG tablet, TAKE 1 TABLET BY MOUTH DAILY, Disp: 90 tablet, Rfl: 0   B Complex-Biotin-FA (B-COMPLEX PO), Take 1 tablet by mouth daily. Super, Disp: , Rfl:    Calcium Carb-Cholecalciferol (CALCIUM CARBONATE-VITAMIN D3) 600-400 MG-UNIT TABS, Take 1 tablet by mouth daily. , Disp: , Rfl:    Cholecalciferol (VITAMIN D3) 50 MCG (2000 UT) TABS, Take 2,000 mg by mouth daily., Disp: , Rfl:    conjugated estrogens (PREMARIN) vaginal cream, Place 1/2 gram per vagina at bedtime twice weekly., Disp: 30 g, Rfl: 1   lamoTRIgine (LAMICTAL) 150 MG tablet, Take 1 tablet (150 mg total) by mouth daily., Disp: 90 tablet, Rfl: 1   Multiple Vitamins-Minerals (MULTIVITAMIN PO), Take 1 tablet by mouth daily. Centrum silver, Disp: , Rfl:    Omega-3 Fatty Acids (FISH OIL) 1000 MG CAPS, Take 1,000 mg by mouth daily. , Disp: , Rfl:    Polyethyl Glycol-Propyl Glycol (SYSTANE ULTRA OP), Place 1 drop into both eyes in the morning and at bedtime. , Disp: , Rfl:  valACYclovir (VALTREX) 1000 MG tablet, Take two tablets ( total 2000 mg) by mouth q12h x 1 day; Start: ASAP after symptom onset, Disp: 6 tablet, Rfl: 2   White Petrolatum-Mineral Oil (SYSTANE NIGHTTIME) OINT, Place 1 application into both eyes at bedtime as needed (Dry eye). , Disp: , Rfl:   No Known Allergies ROS neg/noncontributory except as noted HPI/below      Objective:     BP 130/62   Pulse 72   Temp 98.1 F (36.7 C) (Temporal)   Resp 16   Ht 5' (1.524 m)   Wt 126 lb 8 oz (57.4 kg)   LMP  (LMP Unknown)   SpO2 97%   BMI 24.71 kg/m  Wt Readings from Last 3 Encounters:  06/07/23 126 lb 8 oz (57.4 kg)  05/16/23 126 lb 3.2 oz (57.2 kg)  05/02/23 129 lb 3.2 oz (58.6 kg)     Physical Exam   Gen: [facility] NAD patient cuff 136/75.  Ours 132/72 HEENT: NCAT, conjunctiva not injected, sclera nonicteric.  Lips dry NECK:  supple, no thyromegaly, no nodes, no carotid bruits CARDIAC: RRR, S1S2+, +click vs split. no murmur. DP 2+B LUNGS: CTAB. No wheezes ABDOMEN:  BS+, soft, NTND, No HSM, no masses EXT:  no edema MSK: cane  NEURO: A&O x3.  CN II-XII intact.  PSYCH: normal mood. Good eye contact  Reviewed labs, rheum note, referral    Assessment & Plan:  Essential hypertension Assessment & Plan: Chronic.  Controlled here today, but up and down at home and other appointments.  Has only been checking for the past 2 weeks or so.  Stopped losartan.  Not quite sure why.  Check blood pressures twice daily.  I checked her cuff and ours today and they would agree.  Follow-up in 2 weeks   Lumbar radiculopathy Assessment & Plan: Chronic.  Seeing [facility].  They told her to get physical therapy.  She showed me on her phone where [facility] did talk to her.  Advised to call them back and get scheduled.  She does have an appointment to see a physician there.  (So referral from Dr. Corliss Skains)   Anemia, unspecified type  Dry lips  3.  Anemia-no obvious bleeding.  She got constipated with multivitamin with iron.  Return in 2 weeks for follow-up and will check labs with iron studies and B12.  Will need to assess alcohol use status 4.  Dysuria-patient forgot to stop by the lab 5.  Dry lips-advised to get Aquaphor ointment  Return in about 2 weeks (around 06/21/2023) for HTN,anemia.  Angelena Sole, MD

## 2023-06-07 NOTE — Assessment & Plan Note (Signed)
Chronic.  Seeing [facility].  They told her to get physical therapy.  She showed me on her phone where [facility] did talk to her.  Advised to call them back and get scheduled.  She does have an appointment to see a physician there.  (So referral from Dr. Corliss Skains)

## 2023-06-07 NOTE — Patient Instructions (Addendum)
Check blood pressure twice/day.   Bring blood pressure readings to visit.  Aquaphor ointment-or for lips and use several times per day.

## 2023-06-07 NOTE — Assessment & Plan Note (Signed)
Chronic.  Controlled here today, but up and down at home and other appointments.  Has only been checking for the past 2 weeks or so.  Stopped losartan.  Not quite sure why.  Check blood pressures twice daily.  I checked her cuff and ours today and they would agree.  Follow-up in 2 weeks

## 2023-06-26 NOTE — Telephone Encounter (Signed)
Patient called back for an update on [facility] bill. I re-visited the information described below by Hasna and pt stated she hadn't received any card information. Due to mixed communication I reached out to [employer] and spoke with Marylene Land.   Marylene Land informed me that card information had been mailed to patient and was load with $427.24 since pt has $25 co-pay. I called pt back and informed her of this. Per my advice, pt re-looked through mail and found card information. Pt was transferred over to billing to pay bill securely.

## 2023-06-28 ENCOUNTER — Ambulatory Visit: Payer: Federal, State, Local not specified - PPO | Admitting: Family Medicine

## 2023-07-06 ENCOUNTER — Ambulatory Visit (INDEPENDENT_AMBULATORY_CARE_PROVIDER_SITE_OTHER): Payer: Federal, State, Local not specified - PPO

## 2023-07-06 ENCOUNTER — Ambulatory Visit
Admission: EM | Admit: 2023-07-06 | Discharge: 2023-07-06 | Disposition: A | Discharge status: Home / Self Care | Payer: Federal, State, Local not specified - PPO | Attending: Family Medicine | Admitting: Family Medicine

## 2023-07-06 DIAGNOSIS — M25532 Pain in left wrist: Secondary | ICD-10-CM

## 2023-07-06 DIAGNOSIS — M25531 Pain in right wrist: Secondary | ICD-10-CM

## 2023-07-06 MED ORDER — MELOXICAM 7.5 MG PO TABS: 7.5000 mg | ORAL_TABLET | Freq: Every day | ORAL | 0 refills | Status: DC

## 2023-07-06 NOTE — Discharge Instructions (Addendum)
The radiologist did in the end send this report, and there are no broken bones.  There are arthritic changes.  Meloxicam 7.5 mg--1 daily for joint pain. You may also take Tylenol for pain with this meloxicam medication.  Ice and rest your right wrist.  Please follow-up with your primary care or your rheumatologist about this pain

## 2023-07-06 NOTE — ED Triage Notes (Signed)
Pt reports right wrist pain x 1 day. Reports pain increased this morning and is bruise.

## 2023-07-06 NOTE — ED Provider Notes (Signed)
[facility]    CSN: [id number] Arrival date & time: 07/06/23  1112      History   Chief Complaint Chief Complaint  Patient presents with   Wrist Pain    HPI Christine Scott is a 82 y.o. female.    Wrist Pain  Here for right wrist pain.  Yesterday when she was walking with her cane she noticed some discomfort in her right wrist.  Then this morning it seemed more painful and she noticed some bruising.  The bruising is on the dorsum of the right wrist.  No injury was recalled.  No fall.  No fever or chills and no itching in the area  She does have a history of polymyalgia rheumatica and of Sjogren syndrome.  She is currently not having to take any prednisone.  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 Trial of PT/injection. If doesn't do well good candidate for reverse shoulder arthoplasty. Continue with PT (12/19). No specific f/u was scheduled.    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   FACIAL COSMETIC SURGERY     REVERSE SHOULDER ARTHROPLASTY Left 04/16/2020   Procedure: REVERSE SHOULDER ARTHROPLASTY;  Surgeon: Francena Hanly, MD;  Location: [facility];  Service: Orthopedics;  Laterality: Left;    TOTAL VAGINAL HYSTERECTOMY  age 72   --due to abnormal pap smears per patient    OB History     Gravida  2   Para  2   Term  2   Preterm      AB      Living  4      SAB      IAB      Ectopic      Multiple      Live Births  2            Home Medications    Prior to Admission medications   Medication Sig Start Date End Date Taking? Authorizing Provider  meloxicam (MOBIC) 7.5 MG tablet Take 1 tablet (7.5 mg total) by mouth daily. 07/06/23  Yes Zenia Resides, MD  acetaminophen (TYLENOL) 500 MG tablet Take 1,000 mg by mouth at bedtime.    [provider]  amLODipine (NORVASC) 10 MG tablet TAKE 1  TABLET BY MOUTH DAILY 04/30/23   [name], MD  B Complex-Biotin-FA (B-COMPLEX PO) Take 1 tablet by mouth daily. Super    [provider]  Calcium Carb-Cholecalciferol (CALCIUM CARBONATE-VITAMIN D3) 600-400 MG-UNIT TABS Take 1 tablet by mouth daily.  07/23/15   [provider]  Cholecalciferol (VITAMIN D3) 50 MCG (2000 UT) TABS Take 2,000 mg by mouth daily.    [provider]  conjugated estrogens (PREMARIN) vaginal cream Place 1/2 gram per vagina at bedtime twice weekly. 10/11/22   Patton Salles, MD  lamoTRIgine (LAMICTAL) 150 MG tablet Take 1 tablet (150 mg total) by mouth daily. 10/19/22   Cottle, Steva Ready., MD  Multiple Vitamins-Minerals (MULTIVITAMIN PO) Take 1 tablet by mouth daily. Centrum silver    [provider]  Omega-3 Fatty Acids (FISH OIL) 1000 MG CAPS Take 1,000 mg by mouth daily.     [provider]   Polyethyl Glycol-Propyl Glycol (SYSTANE ULTRA OP) Place 1 drop into both eyes in the morning and at bedtime.     [provider]  valACYclovir (VALTREX) 1000 MG tablet Take two tablets ( total 2000 mg) by mouth q12h x 1 day; Start: ASAP after symptom onset 03/27/23   Jarold Motto, PA  White Petrolatum-Mineral Oil (SYSTANE NIGHTTIME) OINT Place 1 application into both eyes at bedtime as needed (Dry eye).     [provider]    Family History Family History  Problem Relation Age of Onset   Congestive Heart Failure Mother    Stroke Mother    Diabetes Father    Colon cancer Brother    Diabetes Brother    Diabetes Brother    Stroke Brother    Colon cancer Daughter     Social History Social History   Tobacco Use   Smoking status: Never    Passive exposure: Never   Smokeless tobacco: Never  Vaping Use   Vaping status: Never Used  Substance Use Topics   Alcohol use: Yes    Alcohol/week: 2.0 standard drinks of alcohol    Types: 2 Glasses of wine per week    Comment: wine occ   Drug use: Never     Allergies   Patient has no known allergies.   Review of Systems Review of Systems   Physical Exam Triage Vital Signs ED Triage Vitals  Encounter Vitals Group     BP 07/06/23 1123 (!) 152/72     Systolic BP Percentile --      Diastolic BP Percentile --      Pulse Rate 07/06/23 1123 82     Resp 07/06/23 1123 16     Temp 07/06/23 1123 98.1 F (36.7 C)     Temp Source 07/06/23 1123 Oral     SpO2 07/06/23 1123 96 %     Weight --      Height --      Head Circumference --      Peak Flow --      Pain Score 07/06/23 1127 6     Pain Loc --      Pain Education --      Exclude from Growth Chart --    No data found.  Updated Vital Signs BP (!) 152/72 (BP Location: Right Arm)   Pulse 82   Temp 98.1 F (36.7 C) (Oral)   Resp 16   LMP  (LMP Unknown)   SpO2 96%   Visual Acuity Right Eye Distance:   Left Eye  Distance:   Bilateral Distance:    Right  Eye Near:   Left Eye Near:    Bilateral Near:     Physical Exam Vitals reviewed.  Constitutional:      General: She is not in acute distress.    Appearance: She is not ill-appearing, toxic-appearing or diaphoretic.  Musculoskeletal:     Comments: There is some ecchymosis and soft tissue swelling over the dorsum of the right wrist near the radial side.  It is about 2 cm in diameter.  It is soft in the skin is not indurated.  There is no warmth overlying the swelling compared to the surrounding tissues.  There is full range of motion of the distal joints and there is good range of motion of the right wrist, though it causes some pain in the swollen area when she extends at the wrist.  Pulses are normal  Skin:    Coloration: Skin is not pale.  Neurological:     General: No focal deficit present.     Mental Status: She is alert and oriented to person, place, and time.  Psychiatric:        Behavior: Behavior normal.      UC Treatments / Results  Labs (all labs ordered are listed, but only abnormal results are displayed) Labs Reviewed - No data to display  EKG   Radiology DG Wrist Complete Right  Result Date: 07/06/2023 CLINICAL DATA:  Pain and swelling EXAM: RIGHT WRIST - COMPLETE 3+ VIEW COMPARISON:  None Available. FINDINGS: No recent fracture or dislocation is seen. Lunate is slightly smaller than usual with patchy areas of sclerosis. Osteopenia is seen in bony structures. There is soft tissue swelling over the distal ulna. IMPRESSION: No recent fracture or dislocation is seen in right wrist. Osteopenia. Patchy sclerosis seen in the lunate may be residual from previous injury. Electronically Signed   By: Ernie Avena M.D.   On: 07/06/2023 12:24    Procedures Procedures (including critical care time)  Medications Ordered in UC Medications - No data to display  Initial Impression / Assessment and Plan / UC Course  I have reviewed the triage vital signs and the nursing  notes.  Pertinent labs & imaging results that were available during my care of the patient were reviewed by me and considered in my medical decision making (see chart for details).        X-ray is read as negative for fracture.  There are degenerative changes and osteopenia.   wrist brace is applied.  7-day supply of low-dose meloxicam is sent in.  I recommended Tylenol to take with this.  I have asked her to follow-up with her primary care and rheumatologist about this wrist pain Final Clinical Impressions(s) / UC Diagnoses   Final diagnoses:  Right wrist pain     Discharge Instructions      The radiologist did in the end send this report, and there are no broken bones.  There are arthritic changes.  Meloxicam 7.5 mg--1 daily for joint pain. You may also take Tylenol for pain with this meloxicam medication.  Ice and rest your right wrist.  Please follow-up with your primary care or your rheumatologist about this pain       ED Prescriptions     Medication Sig Dispense Auth. Provider   meloxicam (MOBIC) 7.5 MG tablet Take 1 tablet (7.5 mg total) by mouth daily. 7 tablet Azim Gillingham, Janace Aris, MD      PDMP not reviewed this encounter.  Zenia Resides, MD 07/06/23 [phone]

## 2023-08-04 ENCOUNTER — Ambulatory Visit (INDEPENDENT_AMBULATORY_CARE_PROVIDER_SITE_OTHER): Payer: Federal, State, Local not specified - PPO | Admitting: Family

## 2023-08-04 ENCOUNTER — Encounter (HOSPITAL_BASED_OUTPATIENT_CLINIC_OR_DEPARTMENT_OTHER): Payer: Self-pay | Admitting: Family

## 2023-08-04 VITALS — BP 154/78 | HR 85 | Ht 60.0 in | Wt 127.0 lb

## 2023-08-04 DIAGNOSIS — I10 Essential (primary) hypertension: Secondary | ICD-10-CM

## 2023-08-04 MED ORDER — LOSARTAN POTASSIUM 25 MG PO TABS: 25.0000 mg | ORAL_TABLET | Freq: Every day | ORAL | 5 refills | Status: DC

## 2023-08-04 NOTE — Patient Instructions (Addendum)
Medication Instructions:  Your physician has recommended you make the following change in your medication:   START Losartan 25mg  once per day  *If you need a refill on your cardiac medications before your next appointment, please call your pharmacy*   Lab Work: Your physician recommends that you return for lab work in: 1-2 weeks for BMP  Please return for Lab work. You may come to the...   [facility] (3rd floor) [address], [place], [place] [address]  Open: 8am-Noon and 1pm-4:30pm  Please ring the doorbell on the small table when you exit the elevator and the Lab Tech will come get you  [facility] at [facility] [address], [place], [place] [address] Open: 8am-1pm, then 2pm-4:30pm   [facility]- Please see attached locations sheet stapled to your lab work with address and hours.    If you have labs (blood work) drawn today and your tests are completely normal, you will receive your results only by: MyChart Message (if you have MyChart) OR A paper copy in the mail If you have any lab test that is abnormal or we need to change your treatment, we will call you to review the results.   Testing/Procedures: Your EKG looked great today!   Follow-Up: At [facility], you and your health needs are our priority.  As part of our continuing mission to provide you with exceptional heart care, we have created designated Provider Care Teams.  These Care Teams include your primary Cardiologist (physician) and Advanced Practice Providers (APPs -  Physician Assistants and Nurse Practitioners) who all work together to provide you with the care you need, when you need it.  We recommend signing up for the patient portal called "MyChart".  Sign up information is provided on this After Visit Summary.  MyChart is used to connect with patients for Virtual Visits (Telemedicine).  Patients are able to view lab/test results, encounter notes,  upcoming appointments, etc.  Non-urgent messages can be sent to your provider as well.   To learn more about what you can do with MyChart, go to [email or web address].    Your next appointment:   1 month(s)  Provider:   Jodelle Red, MD or Gillian Shields, NP    Other Instructions   Check BP once per day at least an hour after medications Your blood pressure cuff was found to read 10 points high today.   Tips to Measure your Blood Pressure Correctly  Here's what you can do to ensure a correct reading:  Don't drink a caffeinated beverage or smoke during the 30 minutes before the test.  Sit quietly for five minutes before the test begins.  During the measurement, sit in a chair with your feet on the floor and your arm supported so your elbow is at about heart level.  The inflatable part of the cuff should completely cover at least 80% of your upper arm, and the cuff should be placed on bare skin, not over a shirt.  Don't talk during the measurement.  Blood pressure categories  Blood pressure category SYSTOLIC (upper number)  DIASTOLIC (lower number)  Normal Less than 120 mm Hg and Less than 80 mm Hg  Elevated 120-129 mm Hg and Less than 80 mm Hg  High blood pressure: Stage 1 hypertension 130-139 mm Hg or 80-89 mm Hg  High blood pressure: Stage 2 hypertension 140 mm Hg or higher or 90 mm Hg or higher  Hypertensive crisis (consult your doctor immediately)  Higher than 180 mm Hg and/or Higher than 120 mm Hg  Source: American Heart Association and American Stroke Association. For more on getting your blood pressure under control, buy Controlling Your Blood Pressure, a Special Health Report from [facility].   Blood Pressure Log   Date   Time  Blood Pressure  Example: Nov 1 9 AM 124/78

## 2023-08-04 NOTE — Progress Notes (Unsigned)
Cardiology Office Note:  .   Date:  08/04/2023  ID:  Christine Scott, DOB 10/30/1941, MRN [id number] PCP: [name], MD  [facility] Providers Cardiologist:  Jodelle Red, MD { Click to update primary MD,subspecialty MD or APP then REFRESH:1}   History of Present Illness: .   Christine Scott is a 82 y.o. female ***  Saw PCP 05/2023 with labile BP, was not taking Losartan.    Felt slightle swimmy headed and started checking her BP at home.   She takes Amlodipine 10mg  daily in the morning for hypertension.   Coffee in the morning, then water during the day and maybe a decaf coffee in the evening. Eats breakfast of nabs, salad for lunch, meat and sides for dinner.   With Dr. Sheppard Penton BP was low - took off losartan? 118/56, 133/63, 144/69, 155/78, 162/78, 138/71, 141/65, 147/79, 116/63, 138/71, 139/73  ROS: Please see the history of present illness.    All other systems reviewed and are negative.   Studies Reviewed: Marland Kitchen   EKG Interpretation Date/Time:  Friday August 04 2023 11:06:21 EDT Ventricular Rate:  85 PR Interval:  140 QRS Duration:  86 QT Interval:  346 QTC Calculation: 411 R Axis:   58  Text Interpretation: Normal sinus rhythm Confirmed by Gillian Shields ([id number]) on 08/04/2023 11:56:42 AM    Cardiac Studies & Procedures     STRESS TESTS  EXERCISE TOLERANCE TEST (ETT) 09/29/2021  Narrative   No ST deviation was noted. The ECG was negative for ischemia.   Patient exercised for 8 min and 11 sec. Maximum HR of 118 bpm. MPHR 84.0 %. Peak METS 10.1 .  The patient experienced non-limiting angina during the test. The test was stopped because the patient experienced fatigue and dyspnea.   [facility] Treadmill Score =8 (Low Risk)   ECHOCARDIOGRAM  ECHOCARDIOGRAM COMPLETE 12/09/2020  Narrative ECHOCARDIOGRAM REPORT    Patient Name:   Christine Scott Date of Exam: 12/09/2020 Medical Rec #:  [id number]     Height:       61.0 in Accession #:    [id number]    Weight:        127.6 lb Date of Birth:  01-Dec-1941      BSA:          1.560 m Patient Age:    79 years      BP:           126/64 mmHg Patient Gender: F             HR:           72 bpm. Exam Location:  [address]  Procedure: 2D Echo, Cardiac Doppler and Color Doppler  Indications:    R06.00 Dyspnea  History:        Patient has no prior history of Echocardiogram examinations. Risk Factors:Hypertension. COVID-19. Sjogren's Syndrome.  Sonographer:    Cathie Beams RCS Referring Phys: [phone] RAKESH V ALVA  IMPRESSIONS   1. Left ventricular ejection fraction, by estimation, is 60 to 65%. The left ventricle has normal function. The left ventricle has no regional wall motion abnormalities. Left ventricular diastolic parameters were normal. 2. Right ventricular systolic function is normal. The right ventricular size is normal. 3. The mitral valve is normal in structure. Mild mitral valve regurgitation. No evidence of mitral stenosis. 4. The aortic valve is normal in structure. Aortic valve regurgitation is mild. No aortic stenosis is present. 5. The inferior vena cava is normal in  size with greater than 50% respiratory variability, suggesting right atrial pressure of 3 mmHg.  FINDINGS Left Ventricle: Left ventricular ejection fraction, by estimation, is 60 to 65%. The left ventricle has normal function. The left ventricle has no regional wall motion abnormalities. The left ventricular internal cavity size was normal in size. There is no left ventricular hypertrophy. Left ventricular diastolic parameters were normal.  Right Ventricle: The right ventricular size is normal. No increase in right ventricular wall thickness. Right ventricular systolic function is normal.  Left Atrium: Left atrial size was normal in size.  Right Atrium: Right atrial size was normal in size.  Pericardium: There is no evidence of pericardial effusion.  Mitral Valve: The mitral valve is normal in structure. Mild mitral  valve regurgitation. No evidence of mitral valve stenosis.  Tricuspid Valve: The tricuspid valve is normal in structure. Tricuspid valve regurgitation is mild . No evidence of tricuspid stenosis.  Aortic Valve: The aortic valve is normal in structure. Aortic valve regurgitation is mild. Aortic regurgitation PHT measures 540 msec. No aortic stenosis is present.  Pulmonic Valve: The pulmonic valve was normal in structure. Pulmonic valve regurgitation is not visualized. No evidence of pulmonic stenosis.  Aorta: The aortic root is normal in size and structure.  Venous: The inferior vena cava is normal in size with greater than 50% respiratory variability, suggesting right atrial pressure of 3 mmHg.  IAS/Shunts: No atrial level shunt detected by color flow Doppler.   LEFT VENTRICLE PLAX 2D LVIDd:         4.00 cm  Diastology LVIDs:         2.60 cm  LV e' medial:    3.86 cm/s LV PW:         1.10 cm  LV E/e' medial:  14.4 LV IVS:        1.10 cm  LV e' lateral:   7.53 cm/s LVOT diam:     2.00 cm  LV E/e' lateral: 7.4 LV SV:         69 LV SV Index:   44 LVOT Area:     3.14 cm   RIGHT VENTRICLE RV Basal diam:  2.50 cm RV S prime:     13.30 cm/s TAPSE (M-mode): 1.6 cm  LEFT ATRIUM             Index       RIGHT ATRIUM           Index LA diam:        3.60 cm 2.31 cm/m  RA Area:     11.00 cm LA Vol (A2C):   34.3 ml 21.98 ml/m RA Volume:   21.60 ml  13.84 ml/m LA Vol (A4C):   44.6 ml 28.58 ml/m LA Biplane Vol: 39.4 ml 25.25 ml/m AORTIC VALVE LVOT Vmax:   91.40 cm/s LVOT Vmean:  54.600 cm/s LVOT VTI:    0.220 m AI PHT:      540 msec  AORTA Ao Root diam: 3.00 cm  MITRAL VALVE MV Area (PHT): 3.12 cm    SHUNTS MV Decel Time: 243 msec    Systemic VTI:  0.22 m MV E velocity: 55.70 cm/s  Systemic Diam: 2.00 cm MV A velocity: 78.40 cm/s MV E/A ratio:  0.71  Charlton Haws MD Electronically signed by Charlton Haws MD Signature Date/Time: 12/09/2020/4:15:51 PM    Final              Risk Assessment/Calculations:     HYPERTENSION CONTROL Vitals:  08/04/23 1104 08/04/23 1203  BP: (!) 140/60 (!) 154/78    The patient's blood pressure is elevated above target today. {Click here to indicate intervention taken to address high BP   Refresh Note :1} *** In order to address the patient's elevated BP:             Physical Exam:   VS:  BP (!) 154/78   Pulse 85   Ht 5' (1.524 m)   Wt 127 lb (57.6 kg)   LMP  (LMP Unknown)   BMI 24.80 kg/m    Wt Readings from Last 3 Encounters:  08/04/23 127 lb (57.6 kg)  06/07/23 126 lb 8 oz (57.4 kg)  05/16/23 126 lb 3.2 oz (57.2 kg)    GEN: Well nourished, well developed in no acute distress NECK: No JVD; No carotid bruits CARDIAC: ***RRR, no murmurs, rubs, gallops RESPIRATORY:  Clear to auscultation without rales, wheezing or rhonchi  ABDOMEN: Soft, non-tender, non-distended EXTREMITIES:  No edema; No deformity   ASSESSMENT AND PLAN: .   ***       Dispo: ***  Signed, Alver Sorrow, NP

## 2023-08-09 ENCOUNTER — Encounter (HOSPITAL_BASED_OUTPATIENT_CLINIC_OR_DEPARTMENT_OTHER): Payer: Self-pay | Admitting: Family

## 2023-08-11 ENCOUNTER — Other Ambulatory Visit (HOSPITAL_BASED_OUTPATIENT_CLINIC_OR_DEPARTMENT_OTHER): Payer: Self-pay

## 2023-08-15 ENCOUNTER — Other Ambulatory Visit: Payer: Self-pay | Admitting: Family Medicine

## 2023-09-08 ENCOUNTER — Encounter (HOSPITAL_BASED_OUTPATIENT_CLINIC_OR_DEPARTMENT_OTHER): Payer: Self-pay | Admitting: Family

## 2023-09-08 ENCOUNTER — Ambulatory Visit (HOSPITAL_BASED_OUTPATIENT_CLINIC_OR_DEPARTMENT_OTHER): Payer: Federal, State, Local not specified - PPO | Admitting: Family

## 2023-09-08 VITALS — BP 126/62 | HR 67 | Ht 60.0 in | Wt 128.0 lb

## 2023-09-08 DIAGNOSIS — R110 Nausea: Secondary | ICD-10-CM

## 2023-09-08 DIAGNOSIS — I10 Essential (primary) hypertension: Secondary | ICD-10-CM

## 2023-09-08 DIAGNOSIS — K219 Gastro-esophageal reflux disease without esophagitis: Secondary | ICD-10-CM | POA: Diagnosis not present

## 2023-09-08 MED ORDER — LOSARTAN POTASSIUM 25 MG PO TABS: 25.0000 mg | ORAL_TABLET | Freq: Every day | ORAL | 3 refills | Status: DC

## 2023-09-08 NOTE — Patient Instructions (Addendum)
Medication Instructions:   Recommend adding Famotidine (Pepcid) twice per day for 3 days to help with stomach upset.   May stop after 3 days.   If still with upset stomach, recommend follow up with Dr. Ruthine Dose.   *If you need a refill on your cardiac medications before your next appointment, please call your pharmacy*   Follow-Up: At [facility], you and your health needs are our priority.  As part of our continuing mission to provide you with exceptional heart care, we have created designated Provider Care Teams.  These Care Teams include your primary Cardiologist (physician) and Advanced Practice Providers (APPs -  Physician Assistants and Nurse Practitioners) who all work together to provide you with the care you need, when you need it.  We recommend signing up for the patient portal called "MyChart".  Sign up information is provided on this After Visit Summary.  MyChart is used to connect with patients for Virtual Visits (Telemedicine).  Patients are able to view lab/test results, encounter notes, upcoming appointments, etc.  Non-urgent messages can be sent to your provider as well.   To learn more about what you can do with MyChart, go to [email or web address].    Your next appointment:   1 year(s)  Provider:   Jodelle Red, MD    Other Instructions  Let us know if your blood pressure at home is consistently more than 130/80.  Recommend checking 3 times per week.

## 2023-09-08 NOTE — Progress Notes (Unsigned)
Cardiology Office Note:  .   Date:  09/10/2023  ID:  Christine Scott, DOB Jan 27, 1941, MRN [id number] PCP: [name], MD  [facility] Providers Cardiologist:  Jodelle Red, MD    History of Present Illness: .   Christine Scott is a 82 y.o. female with a history of hypertension, Sjogren's, polymyalgia rheumatica, COVID-19 2021.    Established with cardiology 08/2021 for exertional dyspnea.  Normal echo and chest x-ray.  12/2020 ETT peak METS 10.1. Seen by PCP 06/30/22 with BP elevated associated with headaches, switched to Amlodipine-Benazpril 5-10mg  daily.  Seen 07/19/22 by Dr. Cristal Deer. Orthostatic BP drop from 175/75 lying to 129/68 standing noted with improvement after 3 minutes. Recommended to be cautious regarding aggressive management.   Saw PCP 05/2023 with labile BP, was not taking Losartan for unclear reason.   Last seen 08/04/23 with elevated BP. She was started on Losartan 25mg  daily (lower dose than previous) and recommended to continue Amlodipine 10mg  daily.   Presents today for follow up.  [place] the mountains since her last visit. Her BP since last visit has been 118/66, 123/62, 125/68, 126/64, 141/71, 127/69. She did have GI bug while in the mountains requiring urgent care visit and PRN Zofran. Notes persistent symptoms are presently after she eats with post-meal nausea. No vertigo, diarrhea, constipation, vomiting. Has been using Pepto Bismol with some improvement.   Reports no shortness of breath nor dyspnea on exertion. Reports no chest pain, pressure, or tightness. No edema, orthopnea, PND. Reports no palpitations.    ROS: Please see the history of present illness.    All other systems reviewed and are negative.   Studies Reviewed: .        Cardiac Studies & Procedures     STRESS TESTS  EXERCISE TOLERANCE TEST (ETT) 09/29/2021  Narrative   No ST deviation was noted. The ECG was negative for ischemia.   Patient exercised for 8 min and 11  sec. Maximum HR of 118 bpm. MPHR 84.0 %. Peak METS 10.1 .  The patient experienced non-limiting angina during the test. The test was stopped because the patient experienced fatigue and dyspnea.   Duke Treadmill Score =8 (Low Risk)   ECHOCARDIOGRAM  ECHOCARDIOGRAM COMPLETE 12/09/2020  Narrative ECHOCARDIOGRAM REPORT    Patient Name:   Christine Scott Date of Exam: 12/09/2020 Medical Rec #:  [id number]     Height:       61.0 in Accession #:    [id number]    Weight:       127.6 lb Date of Birth:  Sep 23, 1941      BSA:          1.560 m Patient Age:    79 years      BP:           126/64 mmHg Patient Gender: F             HR:           72 bpm. Exam Location:  [address]  Procedure: 2D Echo, Cardiac Doppler and Color Doppler  Indications:    R06.00 Dyspnea  History:        Patient has no prior history of Echocardiogram examinations. Risk Factors:Hypertension. COVID-19. Sjogren's Syndrome.  Sonographer:    Cathie Beams RCS Referring Phys: [phone] RAKESH V ALVA  IMPRESSIONS   1. Left ventricular ejection fraction, by estimation, is 60 to 65%. The left ventricle has normal function. The left ventricle has no regional  wall motion abnormalities. Left ventricular diastolic parameters were normal. 2. Right ventricular systolic function is normal. The right ventricular size is normal. 3. The mitral valve is normal in structure. Mild mitral valve regurgitation. No evidence of mitral stenosis. 4. The aortic valve is normal in structure. Aortic valve regurgitation is mild. No aortic stenosis is present. 5. The inferior vena cava is normal in size with greater than 50% respiratory variability, suggesting right atrial pressure of 3 mmHg.  FINDINGS Left Ventricle: Left ventricular ejection fraction, by estimation, is 60 to 65%. The left ventricle has normal function. The left ventricle has no regional wall motion abnormalities. The left ventricular internal cavity size was normal in size. There  is no left ventricular hypertrophy. Left ventricular diastolic parameters were normal.  Right Ventricle: The right ventricular size is normal. No increase in right ventricular wall thickness. Right ventricular systolic function is normal.  Left Atrium: Left atrial size was normal in size.  Right Atrium: Right atrial size was normal in size.  Pericardium: There is no evidence of pericardial effusion.  Mitral Valve: The mitral valve is normal in structure. Mild mitral valve regurgitation. No evidence of mitral valve stenosis.  Tricuspid Valve: The tricuspid valve is normal in structure. Tricuspid valve regurgitation is mild . No evidence of tricuspid stenosis.  Aortic Valve: The aortic valve is normal in structure. Aortic valve regurgitation is mild. Aortic regurgitation PHT measures 540 msec. No aortic stenosis is present.  Pulmonic Valve: The pulmonic valve was normal in structure. Pulmonic valve regurgitation is not visualized. No evidence of pulmonic stenosis.  Aorta: The aortic root is normal in size and structure.  Venous: The inferior vena cava is normal in size with greater than 50% respiratory variability, suggesting right atrial pressure of 3 mmHg.  IAS/Shunts: No atrial level shunt detected by color flow Doppler.   LEFT VENTRICLE PLAX 2D LVIDd:         4.00 cm  Diastology LVIDs:         2.60 cm  LV e' medial:    3.86 cm/s LV PW:         1.10 cm  LV E/e' medial:  14.4 LV IVS:        1.10 cm  LV e' lateral:   7.53 cm/s LVOT diam:     2.00 cm  LV E/e' lateral: 7.4 LV SV:         69 LV SV Index:   44 LVOT Area:     3.14 cm   RIGHT VENTRICLE RV Basal diam:  2.50 cm RV S prime:     13.30 cm/s TAPSE (M-mode): 1.6 cm  LEFT ATRIUM             Index       RIGHT ATRIUM           Index LA diam:        3.60 cm 2.31 cm/m  RA Area:     11.00 cm LA Vol (A2C):   34.3 ml 21.98 ml/m RA Volume:   21.60 ml  13.84 ml/m LA Vol (A4C):   44.6 ml 28.58 ml/m LA Biplane Vol: 39.4 ml  25.25 ml/m AORTIC VALVE LVOT Vmax:   91.40 cm/s LVOT Vmean:  54.600 cm/s LVOT VTI:    0.220 m AI PHT:      540 msec  AORTA Ao Root diam: 3.00 cm  MITRAL VALVE MV Area (PHT): 3.12 cm    SHUNTS MV Decel Time: 243 msec  Systemic VTI:  0.22 m MV E velocity: 55.70 cm/s  Systemic Diam: 2.00 cm MV A velocity: 78.40 cm/s MV E/A ratio:  0.71  Charlton Haws MD Electronically signed by Charlton Haws MD Signature Date/Time: 12/09/2020/4:15:51 PM    Final             Risk Assessment/Calculations:            Physical Exam:   VS:  BP 126/62   Pulse 67   Ht 5' (1.524 m)   Wt 128 lb (58.1 kg)   LMP  (LMP Unknown)   BMI 25.00 kg/m    Wt Readings from Last 3 Encounters:  09/08/23 128 lb (58.1 kg)  08/04/23 127 lb (57.6 kg)  06/07/23 126 lb 8 oz (57.4 kg)    GEN: Well nourished, well developed in no acute distress NECK: No JVD; No carotid bruits CARDIAC: RRR, no murmurs, rubs, gallops RESPIRATORY:  Clear to auscultation without rales, wheezing or rhonchi  ABDOMEN: Soft, non-tender, non-distended EXTREMITIES:  No edema; No deformity   ASSESSMENT AND PLAN: .    HTN - BP well controlled. Continue current antihypertensive regimen Amlodipine 10mg  daily, Losartan 25mg  daily. Refill provided. Discussed to monitor BP at home at least 2 hours after medications and sitting for 5-10 minutes. Recommend aiming for 150 minutes of moderate intensity activity per week and following a heart healthy diet.      Nausea / GI upset / GERD - Describes "burning" in her throat after eating. Recommend OTC Pepcid 20mg  BID. She has seen urgent care with Rx for Zofran. Discussed that if symptoms do not improve she should follow up with her PCP.        Dispo: follow up in 1 year  Signed, Alver Sorrow, NP

## 2023-09-14 NOTE — Progress Notes (Signed)
Office Visit Note  Patient: Christine Scott             Date of Birth: 1941-03-22           MRN: [id number]             PCP: [name], MD Referring: [name], MD Visit Date: 09/15/2023 Occupation: @GUAROCC @  Subjective:  Pain in both hands  History of Present Illness: FLORENCE TRITTO is a 82 y.o. female with polymyalgia rheumatica, osteoarthritis, degenerative disc disease and osteoporosis.  She states few days ago she was having some discomfort in her shoulders which improved.  Recently she has been having increased pain and discomfort in the bilateral hands.  She has difficulty and getting dressed and holding objects.  She notices swelling in her hands. He denies any neck pain today.  She states her lower back continues to hurt.  Feet pain has improved.  Trochanteric bursitis has improved.   Activities of Daily Living:  Patient reports morning stiffness for 1-2 hours.   Patient Reports nocturnal pain.  Difficulty dressing/grooming: Denies Difficulty climbing stairs: Denies Difficulty getting out of chair: Denies Difficulty using hands for taps, buttons, cutlery, and/or writing: Reports  Review of Systems  Constitutional:  Positive for fatigue.  HENT:  Positive for mouth dryness. Negative for mouth sores.   Eyes:  Positive for dryness.  Respiratory:  Negative for shortness of breath.   Cardiovascular:  Negative for chest pain and palpitations.  Gastrointestinal:  Negative for blood in stool, constipation and diarrhea.  Endocrine: Negative for increased urination.  Genitourinary:  Negative for involuntary urination.  Musculoskeletal:  Positive for joint pain, joint pain, joint swelling, myalgias, muscle weakness, morning stiffness, muscle tenderness and myalgias. Negative for gait problem.  Skin:  Negative for color change, rash, hair loss and sensitivity to sunlight.  Allergic/Immunologic: Negative for susceptible to infections.  Neurological:  Negative for  dizziness and headaches.  Hematological:  Negative for swollen glands.  Psychiatric/Behavioral:  Negative for depressed mood and sleep disturbance. The patient is not nervous/anxious.     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   Greater trochanteric bursitis of left hip 07/04/2019   DDD (degenerative disc disease), lumbar 02/25/2019   Bipolar II disorder (HCC) 12/07/2018   Essential hypertension 08/12/2017   Lumbar radiculopathy 06/15/2017   Sjogren's syndrome 12/25/2016   Polymyalgia rheumatica (HCC) 12/25/2016   Primary osteoarthritis of both hands 12/25/2016   Primary osteoarthritis of both feet 12/25/2016   Osteoporosis 12/25/2016    Past Medical History:  Diagnosis Date   Abnormal Pap smear of cervix    --hx abnormal paps in her 30s and per patient this was reason for her Hysterectomy   Anxiety    Bursitis 12/26/2020   Depression    Dry eye    Dyspnea on exertion 10/07/2020   Encounter for herpes zoster vaccination 10/18/2021   History of depression 08/12/2017   Hypertension    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 Trial of PT/injection. If doesn't do well good candidate for reverse shoulder arthoplasty. Continue with PT (12/19). No specific f/u was scheduled.    Osteoarthritis    hands   Osteoporosis    Polymyalgia rheumatica (HCC)     Family History  Problem Relation Age of Onset   Congestive Heart Failure Mother  Stroke Mother    Diabetes Father    Colon cancer Brother    Diabetes Brother    Diabetes Brother    Stroke Brother    Colon cancer Daughter    Past Surgical History:  Procedure Laterality Date   ABDOMINAL HYSTERECTOMY     BREAST SURGERY  2016   benign mass removed in [place], [place].   FACIAL COSMETIC SURGERY     REVERSE SHOULDER ARTHROPLASTY Left 04/16/2020    Procedure: REVERSE SHOULDER ARTHROPLASTY;  Surgeon: Francena Hanly, MD;  Location: [facility];  Service: Orthopedics;  Laterality: Left;    TOTAL VAGINAL HYSTERECTOMY  age 2   --due to abnormal pap smears per patient   Social History   Social History Narrative   Right handed   Lives alone   Immunization History  Administered Date(s) Administered   Fluad Quad(high Dose 65+) 08/28/2019, 11/24/2020, 09/09/2021   Influenza, High Dose Seasonal PF 10/21/2017, 11/27/2018, 09/15/2022   Influenza-Unspecified 10/13/2016   PFIZER Comirnaty(Gray Top)Covid-19 Tri-Sucrose Vaccine 05/31/2021   PFIZER(Purple Top)SARS-COV-2 Vaccination 01/12/2020, 02/05/2020, 11/07/2020   Pfizer Covid-19 Vaccine Bivalent Booster 36yrs & up 11/24/2021   Pfizer(Comirnaty)Fall Seasonal Vaccine 12 years and older 10/07/2022   Pneumococcal Conjugate-13 08/17/2018   Pneumococcal Polysaccharide-23 01/19/2016   Tdap 12/26/2009, 10/18/2021   Zoster Recombinant(Shingrix) 10/18/2021, 09/09/2022     Objective: Vital Signs: BP 131/68 (BP Location: Left Arm, Patient Position: Sitting, Cuff Size: Normal)   Pulse 72   Resp 13   Ht 5' (1.524 m)   Wt 124 lb (56.2 kg)   LMP  (LMP Unknown)   BMI 24.22 kg/m    Physical Exam Vitals and nursing note reviewed.  Constitutional:      Appearance: She is well-developed.  HENT:     Head: Normocephalic and atraumatic.  Eyes:     Conjunctiva/sclera: Conjunctivae normal.  Cardiovascular:     Rate and Rhythm: Normal rate and regular rhythm.     Heart sounds: Normal heart sounds.  Pulmonary:     Effort: Pulmonary effort is normal.     Breath sounds: Normal breath sounds.  Abdominal:     General: Bowel sounds are normal.     Palpations: Abdomen is soft.  Musculoskeletal:     Cervical back: Normal range of motion.  Lymphadenopathy:     Cervical: No cervical adenopathy.  Skin:    General: Skin is warm and dry.     Capillary Refill: Capillary refill takes less than 2 seconds.   Neurological:     Mental Status: She is alert and oriented to person, place, and time.  Psychiatric:        Behavior: Behavior normal.      Musculoskeletal Exam: Patient had limited range of motion of the cervical spine with the stiffness.  Thoracic kyphosis was noted.  There was no point tenderness over thoracic or lumbar spine.  Shoulder joints were in good range of motion except limited internal rotation of her left shoulder which is replaced.  She had discomfort range of motion of her left shoulder joint.  Elbow joints and wrist joints were in good range of motion.  She had synovitis over bilateral wrist joints and some of her MCPs and PIPs as described below.  Hip joints and knee joints were in good range of motion without any warmth swelling or effusion.  She had some tenderness over the lateral aspect of her right foot.  No synovitis was noted.  CDAI Exam: CDAI Score: -- Patient Global: --; Provider Global: --  Swollen: 9 ; Tender: 10  Joint Exam 09/15/2023      Right  Left  Wrist  Swollen Tender     CMC     Swollen Tender  MCP 2  Swollen Tender  Swollen Tender  MCP 3  Swollen Tender     PIP 2 (finger)     Swollen Tender  PIP 3 (finger)  Swollen Tender     PIP 4 (finger)  Swollen Tender  Swollen Tender  MTP 5   Tender        Investigation: No additional findings.  Imaging: No results found.  Recent Labs: Lab Results  Component Value Date   WBC 6.5 05/02/2023   HGB 10.7 (L) 05/02/2023   PLT 228 05/02/2023   NA 140 05/02/2023   K 4.4 05/02/2023   CL 105 05/02/2023   CO2 27 05/02/2023   GLUCOSE 137 (H) 05/02/2023   BUN 25 05/02/2023   CREATININE 0.88 05/02/2023   BILITOT 0.3 05/02/2023   ALKPHOS 58 04/15/2021   AST 15 05/02/2023   ALT 8 05/02/2023   PROT 6.3 05/02/2023   PROT 6.4 05/02/2023   ALBUMIN 4.1 04/15/2021   CALCIUM 9.1 05/02/2023   GFRAA >60 04/13/2020    Speciality Comments: Osteoporosis managed by PCP.  Procedures:  No procedures  performed Allergies: Patient has no known allergies.   Assessment / Plan:     Visit Diagnoses: Polymyalgia rheumatica (HCC) - Her PMR is under remission.  No muscular weakness or tenderness was noted.  Sjogren's syndrome - history of positive ANA, positive SSA antibody and hypocomplementemia.  She continues to have dry mouth and dry eyes.  Her symptoms are manageable with over-the-counter products.- Plan: hydroxychloroquine (PLAQUENIL) 200 MG tablet 1 tablet p.o. twice daily Monday to Friday only.  We may reduce the dose of Plaquenil in the future if arthritis improves.  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  Synovitis was noted in the wrist joints and MCPs and PIPs.  I will obtain labs today.  She is having severe discomfort and difficulty using her hands.  I will give her a prednisone taper starting at 20 mg and taper by 5 mg every 2 days.  I will also add hydroxychloroquine 200 mg p.o. twice daily Monday to Friday only.  Side effects of both medications were discussed at length.  Association of hypertension, diabetes, weight gain, osteoporosis with prednisone was discussed.  A handout was given and consent was taken.  Plan: Sedimentation rate, Rheumatoid factor, Cyclic citrul peptide antibody, IgG, hydroxychloroquine (PLAQUENIL) 200 MG tablet, predniSONE (DELTASONE) 5 MG tablet  Patient was counseled on the purpose, proper use, and adverse effects of hydroxychloroquine including nausea/diarrhea, skin rash, headaches, and sun sensitivity.  Advised patient to wear sunscreen once starting hydroxychloroquine to reduce risk of rash associated with sun sensitivity.  Discussed importance of annual eye exams while on hydroxychloroquine to monitor to ocular toxicity and discussed importance of frequent laboratory monitoring.  Provided patient with eye exam form for baseline ophthalmologic exam.  Provided patient with educational materials on hydroxychloroquine  and answered all questions.  Patient consented to hydroxychloroquine. Will upload consent in the media tab.    Reviewed risk for QTC prolongation when used in combination with other QTc prolonging agents (including but not limited to antiarrhythmics, macrolide antibiotics, flouroquinolone antibiotics, haloperidol, quetiapine, olanzapine, risperidone, droperidol, ziprasidone, amitriptyline, citalopram, ondansetron, migraine triptans, and methadone).  High risk medication use - Plan: CBC with Differential/Platelet, COMPLETE METABOLIC PANEL  WITH GFR today, 1 month and then every 3 months.  History of left shoulder replacement - She had reverse shoulder arthroplasty by Dr. Rennis Chris in April 2021.  She has limited internal rotation.  Pain in both hands -she had pain and swelling in her bilateral wrist joints and MCPs and PIPs as described above.  Plan: XR Hand 2 View Right, XR Hand 2 View Left.  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  Cystic versus erosive changes were noted in the carpal bones.  Primary osteoarthritis of both hands-she is osteoarthritis bilateral PIP and DIP thickening.  I will refer her to PT and OT.  Trochanteric bursitis, left hip-resolved.  Primary osteoarthritis of both feet -she has intermittent discomfort in her feet.  Plan: XR Foot 2 Views Right, XR Foot 2 Views Left.  X-rays of bilateral feet were consistent with osteoarthritis.  DDD (degenerative disc disease), cervical-she had limited range of motion with no discomfort.  DDD (degenerative disc disease), lumbar-she had limited range of motion without discomfort.  Osteoporosis - She is on Prolia. Her osteoporosis has been managed by her PCP.  Patient has not had a DEXA scan since 2020.  I am uncertain if she is getting Prolia injections now.  I advised her to discuss with Dr. Ruthine Dose if she is on Prolia injections.  Vitamin D deficiency -I will check vitamin D  level today.  Plan: VITAMIN D 25 Hydroxy (Vit-D Deficiency, Fractures)  History of hypertension-blood pressure was normal at 131/68.  History of depression  Memory loss - referred to neuro in the past.  Orders: Orders Placed This Encounter  Procedures   XR Hand 2 View Right   XR Hand 2 View Left   XR Foot 2 Views Right   XR Foot 2 Views Left   CBC with Differential/Platelet   COMPLETE METABOLIC PANEL WITH GFR   Sedimentation rate   Rheumatoid factor   Cyclic citrul peptide antibody, IgG   VITAMIN D 25 Hydroxy (Vit-D Deficiency, Fractures)   Meds ordered this encounter  Medications   hydroxychloroquine (PLAQUENIL) 200 MG tablet    Sig: Take 200mg  by mouth twice daily, Monday through Friday only. None on Saturday or Sunday.    Dispense:  40 tablet    Refill:  2   predniSONE (DELTASONE) 5 MG tablet    Sig: Take 4 tabs po qd x 2 days, 3  tabs po qd x 2 days, 2  tabs po qd x 2 days, 1  tab po qd x 2 days    Dispense:  20 tablet    Refill:  0     Follow-Up Instructions: Return in about 6 weeks (around 10/27/2023) for Osteoporosis, PMR, Sjogren's.   Pollyann Savoy, MD  Note - This record has been created using Animal nutritionist.  Chart creation errors have been sought, but may not always  have been located. Such creation errors do not reflect on  the standard of medical care.

## 2023-09-15 ENCOUNTER — Ambulatory Visit: Payer: Federal, State, Local not specified - PPO | Attending: Physician Assistant | Admitting: Rheumatology

## 2023-09-15 ENCOUNTER — Ambulatory Visit (INDEPENDENT_AMBULATORY_CARE_PROVIDER_SITE_OTHER): Payer: Federal, State, Local not specified - PPO

## 2023-09-15 ENCOUNTER — Ambulatory Visit: Payer: Federal, State, Local not specified - PPO

## 2023-09-15 ENCOUNTER — Encounter: Payer: Self-pay | Admitting: Rheumatology

## 2023-09-15 VITALS — BP 131/68 | HR 72 | Resp 13 | Ht 60.0 in | Wt 124.0 lb

## 2023-09-15 DIAGNOSIS — M79642 Pain in left hand: Secondary | ICD-10-CM | POA: Diagnosis not present

## 2023-09-15 DIAGNOSIS — M7062 Trochanteric bursitis, left hip: Secondary | ICD-10-CM

## 2023-09-15 DIAGNOSIS — M19071 Primary osteoarthritis, right ankle and foot: Secondary | ICD-10-CM

## 2023-09-15 DIAGNOSIS — M5136 Other intervertebral disc degeneration, lumbar region: Secondary | ICD-10-CM

## 2023-09-15 DIAGNOSIS — M19072 Primary osteoarthritis, left ankle and foot: Secondary | ICD-10-CM

## 2023-09-15 DIAGNOSIS — M79641 Pain in right hand: Secondary | ICD-10-CM | POA: Diagnosis not present

## 2023-09-15 DIAGNOSIS — M353 Polymyalgia rheumatica: Secondary | ICD-10-CM

## 2023-09-15 DIAGNOSIS — E559 Vitamin D deficiency, unspecified: Secondary | ICD-10-CM

## 2023-09-15 DIAGNOSIS — M810 Age-related osteoporosis without current pathological fracture: Secondary | ICD-10-CM

## 2023-09-15 DIAGNOSIS — Z8659 Personal history of other mental and behavioral disorders: Secondary | ICD-10-CM

## 2023-09-15 DIAGNOSIS — M5030 Other cervical disc degeneration, unspecified cervical region: Secondary | ICD-10-CM

## 2023-09-15 DIAGNOSIS — M3501 Sicca syndrome with keratoconjunctivitis: Secondary | ICD-10-CM | POA: Diagnosis not present

## 2023-09-15 DIAGNOSIS — M51369 Other intervertebral disc degeneration, lumbar region without mention of lumbar back pain or lower extremity pain: Secondary | ICD-10-CM

## 2023-09-15 DIAGNOSIS — R413 Other amnesia: Secondary | ICD-10-CM

## 2023-09-15 DIAGNOSIS — Z96612 Presence of left artificial shoulder joint: Secondary | ICD-10-CM

## 2023-09-15 DIAGNOSIS — Z79899 Other long term (current) drug therapy: Secondary | ICD-10-CM | POA: Diagnosis not present

## 2023-09-15 DIAGNOSIS — Z8679 Personal history of other diseases of the circulatory system: Secondary | ICD-10-CM

## 2023-09-15 DIAGNOSIS — M19042 Primary osteoarthritis, left hand: Secondary | ICD-10-CM

## 2023-09-15 DIAGNOSIS — M19041 Primary osteoarthritis, right hand: Secondary | ICD-10-CM

## 2023-09-15 DIAGNOSIS — M1990 Unspecified osteoarthritis, unspecified site: Secondary | ICD-10-CM | POA: Diagnosis not present

## 2023-09-15 DIAGNOSIS — R2681 Unsteadiness on feet: Secondary | ICD-10-CM

## 2023-09-15 MED ORDER — PREDNISONE 5 MG PO TABS
ORAL_TABLET | ORAL | 0 refills | Status: DC
Start: 2023-09-15 — End: 2023-10-10

## 2023-09-15 MED ORDER — HYDROXYCHLOROQUINE SULFATE 200 MG PO TABS
ORAL_TABLET | ORAL | 2 refills | Status: DC
Start: 2023-09-15 — End: 2024-01-01

## 2023-09-15 NOTE — Telephone Encounter (Signed)
Patient called to schedule Prolia. Needing clarification re: OOP of $301 or billing to insurance. Please advise

## 2023-09-15 NOTE — Patient Instructions (Addendum)
Please discuss next Prolia injection with your PCP.   Standing Labs We placed an order today for your standing lab work.   Please have your standing labs drawn in 1 month and then every 3 months.   Please have your labs drawn 2 weeks prior to your appointment so that the provider can discuss your lab results at your appointment, if possible.  Please note that you may see your imaging and lab results in MyChart before we have reviewed them. We will contact you once all results are reviewed. Please allow our office up to 72 hours to thoroughly review all of the results before contacting the office for clarification of your results.  WALK-IN LAB HOURS  Monday through Thursday from 8:00 am -12:30 pm and 1:00 pm-5:00 pm and Friday from 8:00 am-12:00 pm.  Patients with office visits requiring labs will be seen before walk-in labs.  You may encounter longer than normal wait times. Please allow additional time. Wait times may be shorter on  Monday and Thursday afternoons.  We do not book appointments for walk-in labs. We appreciate your patience and understanding with our staff.   Labs are drawn by [facility]. Please bring your co-pay at the time of your lab draw.  You may receive a bill from [facility] for your lab work.  Please note if you are on Hydroxychloroquine and and an order has been placed for a Hydroxychloroquine level,  you will need to have it drawn 4 hours or more after your last dose.  If you wish to have your labs drawn at another location, please call the office 24 hours in advance so we can fax the orders.  The office is located at [address], [place], [place] [address]   If you have any questions regarding directions or hours of operation,  please call [phone].   As a reminder, please drink plenty of water prior to coming for your lab work. Thanks!    Hydroxychloroquine Tablets What is this medication? HYDROXYCHLOROQUINE (hye drox ee KLOR oh kwin) treats  autoimmune conditions, such as rheumatoid arthritis and lupus. It works by slowing down an overactive immune system. It may also be used to prevent and treat malaria. It works by killing the parasite that causes malaria. It belongs to a group of medications called DMARDs. This medicine may be used for other purposes; ask your health care provider or pharmacist if you have questions. COMMON BRAND NAME(S): Plaquenil, Quineprox, SOVUNA What should I tell my care team before I take this medication? They need to know if you have any of these conditions: Diabetes Eye disease, vision problems Frequently drink alcohol G6PD deficiency Heart disease Irregular heartbeat or rhythm Kidney disease Liver disease Porphyria Psoriasis An unusual or allergic reaction to hydroxychloroquine, other medications, foods, dyes, or preservatives Pregnant or trying to get pregnant Breastfeeding How should I use this medication? Take this medication by mouth with water. Take it as directed on the prescription label. Do not cut, crush, or chew this medication. Swallow the tablets whole. Take it with food. Do not take it more than directed. Take all of this medication unless your care team tells you to stop it early. Keep taking it even if you think you are better. Take products with antacids in them at a different time of day than this medication. Take this medication 4 hours before or 4 hours after antacids. Talk to your care team if you have questions. Talk to your care team about the use  of this medication in children. While this medication may be prescribed for selected conditions, precautions do apply. Overdosage: If you think you have taken too much of this medicine contact a poison control center or emergency room at once. NOTE: This medicine is only for you. Do not share this medicine with others. What if I miss a dose? If you miss a dose, take it as soon as you can. If it is almost time for your next dose, take  only that dose. Do not take double or extra doses. What may interact with this medication? Do not take this medication with any of the following: Cisapride Dronedarone Pimozide Thioridazine This medication may also interact with the following: Ampicillin Antacids Cimetidine Cyclosporine Digoxin Kaolin Medications for diabetes, such as insulin, glipizide, glyburide Medications for seizures, such as carbamazepine, phenobarbital, phenytoin Mefloquine Methotrexate Other medications that cause heart rhythm changes Praziquantel This list may not describe all possible interactions. Give your health care provider a list of all the medicines, herbs, non-prescription drugs, or dietary supplements you use. Also tell them if you smoke, drink alcohol, or use illegal drugs. Some items may interact with your medicine. What should I watch for while using this medication? Visit your care team for regular checks on your progress. Tell your care team if your symptoms do not start to get better or if they get worse. You may need blood work done while you are taking this medication. If you take other medications that can affect heart rhythm, you may need more testing. Talk to your care team if you have questions. Your vision may be tested before and during use of this medication. Tell your care team right away if you have any change in your eyesight. This medication may cause serious skin reactions. They can happen weeks to months after starting the medication. Contact your care team right away if you notice fevers or flu-like symptoms with a rash. The rash may be red or purple and then turn into blisters or peeling of the skin. Or, you might notice a red rash with swelling of the face, lips or lymph nodes in your neck or under your arms. If you or your family notice any changes in your behavior, such as new or worsening depression, thoughts of harming yourself, anxiety, or other unusual or disturbing thoughts,  or memory loss, call your care team right away. What side effects may I notice from receiving this medication? Side effects that you should report to your care team as soon as possible: Allergic reactions--skin rash, itching, hives, swelling of the face, lips, tongue, or throat Aplastic anemia--unusual weakness or fatigue, dizziness, headache, trouble breathing, increased bleeding or bruising Change in vision Heart rhythm changes--fast or irregular heartbeat, dizziness, feeling faint or lightheaded, chest pain, trouble breathing Infection--fever, chills, cough, or sore throat Low blood sugar (hypoglycemia)--tremors or shaking, anxiety, sweating, cold or clammy skin, confusion, dizziness, rapid heartbeat Muscle injury--unusual weakness or fatigue, muscle pain, dark yellow or brown urine, decrease in amount of urine Pain, tingling, or numbness in the hands or feet Rash, fever, and swollen lymph nodes Redness, blistering, peeling, or loosening of the skin, including inside the mouth Thoughts of suicide or self-harm, worsening mood, or feelings of depression Unusual bruising or bleeding Side effects that usually do not require medical attention (report to your care team if they continue or are bothersome): Diarrhea Headache Nausea Stomach pain Vomiting This list may not describe all possible side effects. Call your doctor for medical advice about side  effects. You may report side effects to FDA at 1-800-FDA-1088. Where should I keep my medication? Keep out of the reach of children and pets. Store at room temperature up to 30 degrees C (86 degrees F). Protect from light. Get rid of any unused medication after the expiration date. To get rid of medications that are no longer needed or have expired: Take the medication to a medication take-back program. Check with your pharmacy or law enforcement to find a location. If you cannot return the medication, check the label or package insert to see if  the medication should be thrown out in the garbage or flushed down the toilet. If you are not sure, ask your care team. If it is safe to put it in the trash, empty the medication out of the container. Mix the medication with cat litter, dirt, coffee grounds, or other unwanted substance. Seal the mixture in a bag or container. Put it in the trash. NOTE: This sheet is a summary. It may not cover all possible information. If you have questions about this medicine, talk to your doctor, pharmacist, or health care provider.  2024 Elsevier/Gold Standard (2022-06-20 00:00:00) Vaccines You are taking a medication(s) that can suppress your immune system.  The following immunizations are recommended: Flu annually Covid-19  RSV Td/Tdap (tetanus, diphtheria, pertussis) every 10 years Pneumonia (Prevnar 15 then Pneumovax 23 at least 1 year apart.  Alternatively, can take Prevnar 20 without needing additional dose) Shingrix: 2 doses from 4 weeks to 6 months apart  Please check with your PCP to make sure you are up to date.    Hand Exercises Hand exercises can be helpful for almost anyone. They can strengthen your hands and improve flexibility and movement. The exercises can also increase blood flow to the hands. These results can make your work and daily tasks easier for you. Hand exercises can be especially helpful for people who have joint pain from arthritis or nerve damage from using their hands over and over. These exercises can also help people who injure a hand. Exercises Most of these hand exercises are gentle stretching and motion exercises. It is usually safe to do them often throughout the day. Warming up your hands before exercise may help reduce stiffness. You can do this with gentle massage or by placing your hands in warm water for 10-15 minutes. It is normal to feel some stretching, pulling, tightness, or mild discomfort when you begin new exercises. In time, this will improve. Remember to  always be careful and stop right away if you feel sudden, very bad pain or your pain gets worse. You want to get better and be safe. Ask your health care provider which exercises are safe for you. Do exercises exactly as told by your provider and adjust them as told. Do not begin these exercises until told by your provider. Knuckle bend or "claw" fist  Stand or sit with your arm, hand, and all five fingers pointed straight up. Make sure to keep your wrist straight. Gently bend your fingers down toward your palm until the tips of your fingers are touching your palm. Keep your big knuckle straight and only bend the small knuckles in your fingers. Hold this position for 10 seconds. Straighten your fingers back to your starting position. Repeat this exercise 5-10 times with each hand. Full finger fist  Stand or sit with your arm, hand, and all five fingers pointed straight up. Make sure to keep your wrist straight. Gently bend your fingers  into your palm until the tips of your fingers are touching the middle of your palm. Hold this position for 10 seconds. Extend your fingers back to your starting position, stretching every joint fully. Repeat this exercise 5-10 times with each hand. Straight fist  Stand or sit with your arm, hand, and all five fingers pointed straight up. Make sure to keep your wrist straight. Gently bend your fingers at the big knuckle, where your fingers meet your hand, and at the middle knuckle. Keep the knuckle at the tips of your fingers straight and try to touch the bottom of your palm. Hold this position for 10 seconds. Extend your fingers back to your starting position, stretching every joint fully. Repeat this exercise 5-10 times with each hand. Tabletop  Stand or sit with your arm, hand, and all five fingers pointed straight up. Make sure to keep your wrist straight. Gently bend your fingers at the big knuckle, where your fingers meet your hand, as far down as you  can. Keep the small knuckles in your fingers straight. Think of forming a tabletop with your fingers. Hold this position for 10 seconds. Extend your fingers back to your starting position, stretching every joint fully. Repeat this exercise 5-10 times with each hand. Finger spread  Place your hand flat on a table with your palm facing down. Make sure your wrist stays straight. Spread your fingers and thumb apart from each other as far as you can until you feel a gentle stretch. Hold this position for 10 seconds. Bring your fingers and thumb tight together again. Hold this position for 10 seconds. Repeat this exercise 5-10 times with each hand. Making circles  Stand or sit with your arm, hand, and all five fingers pointed straight up. Make sure to keep your wrist straight. Make a circle by touching the tip of your thumb to the tip of your index finger. Hold for 10 seconds. Then open your hand wide. Repeat this motion with your thumb and each of your fingers. Repeat this exercise 5-10 times with each hand. Thumb motion  Sit with your forearm resting on a table and your wrist straight. Your thumb should be facing up toward the ceiling. Keep your fingers relaxed as you move your thumb. Lift your thumb up as high as you can toward the ceiling. Hold for 10 seconds. Bend your thumb across your palm as far as you can, reaching the tip of your thumb for the small finger (pinkie) side of your palm. Hold for 10 seconds. Repeat this exercise 5-10 times with each hand. Grip strengthening  Hold a stress ball or other soft ball in the middle of your hand. Slowly increase the pressure, squeezing the ball as much as you can without causing pain. Think of bringing the tips of your fingers into the middle of your palm. All of your finger joints should bend when doing this exercise. Hold your squeeze for 10 seconds, then relax. Repeat this exercise 5-10 times with each hand. Contact a health care provider  if: Your hand pain or discomfort gets much worse when you do an exercise. Your hand pain or discomfort does not improve within 2 hours after you exercise. If you have either of these problems, stop doing these exercises right away. Do not do them again unless your provider says that you can. Get help right away if: You develop sudden, severe hand pain or swelling. If this happens, stop doing these exercises right away. Do not do them again  unless your provider says that you can. This information is not intended to replace advice given to you by your health care provider. Make sure you discuss any questions you have with your health care provider. Document Revised: 12/27/2022 Document Reviewed: 12/27/2022 Elsevier Patient Education  2024 [employer].

## 2023-09-17 LAB — COMPLETE METABOLIC PANEL WITH GFR
AG Ratio: 1.5 (calc) (ref 1.0–2.5)
ALT: 10 U/L (ref 6–29)
AST: 16 U/L (ref 10–35)
Albumin: 4.2 g/dL (ref 3.6–5.1)
Alkaline phosphatase (APISO): 57 U/L (ref 37–153)
BUN: 16 mg/dL (ref 7–25)
CO2: 27 mmol/L (ref 20–32)
Calcium: 10.2 mg/dL (ref 8.6–10.4)
Chloride: 100 mmol/L (ref 98–110)
Creat: 0.89 mg/dL (ref 0.60–0.95)
Globulin: 2.8 g/dL (calc) (ref 1.9–3.7)
Glucose, Bld: 87 mg/dL (ref 65–99)
Potassium: 4.7 mmol/L (ref 3.5–5.3)
Sodium: 137 mmol/L (ref 135–146)
Total Bilirubin: 0.5 mg/dL (ref 0.2–1.2)
Total Protein: 7 g/dL (ref 6.1–8.1)
eGFR: 64 mL/min/{1.73_m2} (ref >=60)

## 2023-09-17 LAB — CBC WITH DIFFERENTIAL/PLATELET
Absolute Monocytes: 808 cells/uL (ref 200–950)
Basophils Absolute: 28 cells/uL (ref 0–200)
Basophils Relative: 0.3 %
Eosinophils Absolute: 56 cells/uL (ref 15–500)
Eosinophils Relative: 0.6 %
HCT: 37 % (ref 35.0–45.0)
Hemoglobin: 11.8 g/dL (ref 11.7–15.5)
Lymphs Abs: 1072 cells/uL (ref 850–3900)
MCH: 29.1 pg (ref 27.0–33.0)
MCHC: 31.9 g/dL — ABNORMAL LOW (ref 32.0–36.0)
MCV: 91.4 fL (ref 80.0–100.0)
MPV: 10.7 fL (ref 7.5–12.5)
Monocytes Relative: 8.6 %
Neutro Abs: 7435 cells/uL (ref 1500–7800)
Neutrophils Relative %: 79.1 %
Platelets: 295 10*3/uL (ref 140–400)
RBC: 4.05 10*6/uL (ref 3.80–5.10)
RDW: 12.2 % (ref 11.0–15.0)
Total Lymphocyte: 11.4 %
WBC: 9.4 10*3/uL (ref 3.8–10.8)

## 2023-09-17 LAB — RHEUMATOID FACTOR: Rheumatoid fact SerPl-aCnc: <10 IU/mL (ref <14)

## 2023-09-17 LAB — CYCLIC CITRUL PEPTIDE ANTIBODY, IGG: Cyclic Citrullin Peptide Ab: <16 UNITS

## 2023-09-17 LAB — VITAMIN D 25 HYDROXY (VIT D DEFICIENCY, FRACTURES): Vit D, 25-Hydroxy: 39 ng/mL (ref 30–100)

## 2023-09-17 LAB — SEDIMENTATION RATE: Sed Rate: 33 mm/h — ABNORMAL HIGH (ref 0–30)

## 2023-09-18 ENCOUNTER — Telehealth: Payer: Self-pay | Admitting: *Deleted

## 2023-09-18 NOTE — Telephone Encounter (Signed)
Patient contacted the office stating she was given a prescription for PLQ and Prednisone for continuing inflammation in her hands. Patient states she started both medications. Patient states the prednisone made her jittery, anxious and nauseous. Patient states she stopped taking the Prednisone. Patient states she has continued the PLQ. Patient states she would like to know if there is something else you would like to prescribe to help with the inflammation. Patient states her hands are still swollen but they have improved. Patient states she is still have pain in her fingers. Please advise.

## 2023-09-18 NOTE — Telephone Encounter (Signed)
I returned the patient's call.  I advised her to reduce the dose of prednisone to 5 mg p.o. every morning.  She was advised to contact us if she develops any side effects from the lower dose of prednisone.  Patient voiced understanding.

## 2023-09-18 NOTE — Progress Notes (Signed)
CBC and CMP are normal, sed rate elevated representing inflammation in the joints, tests for rheumatoid arthritis (RF and anti-CCP antibodies) negative, vitamin D is normal.  Patient should continue to take vitamin D.

## 2023-09-20 NOTE — Telephone Encounter (Signed)
Prolia VOB initiated via [email or web address] 

## 2023-09-21 NOTE — Telephone Encounter (Signed)
Called pt and LVM informing patient that we can move forward in scheduling her for her next prolia.

## 2023-09-22 ENCOUNTER — Other Ambulatory Visit (HOSPITAL_BASED_OUTPATIENT_CLINIC_OR_DEPARTMENT_OTHER): Payer: Self-pay

## 2023-09-22 MED ORDER — INFLUENZA VAC A&B SURF ANT ADJ 0.5 ML IM SUSY
0.5000 mL | PREFILLED_SYRINGE | Freq: Once | INTRAMUSCULAR | 0 refills | Status: AC
  Filled 2023-09-22: qty 0.5, 1d supply, fill #0

## 2023-09-22 MED ORDER — COVID-19 MRNA VAC-TRIS(PFIZER) 30 MCG/0.3ML IM SUSY
0.3000 mL | PREFILLED_SYRINGE | Freq: Once | INTRAMUSCULAR | 0 refills | Status: AC
  Filled 2023-09-22: qty 0.3, 1d supply, fill #0

## 2023-09-29 ENCOUNTER — Other Ambulatory Visit (HOSPITAL_COMMUNITY): Payer: Self-pay

## 2023-09-29 NOTE — Telephone Encounter (Addendum)
PA Case ID: [id number]

## 2023-09-29 NOTE — Telephone Encounter (Signed)
Pt ready for scheduling for PROLIA on or after : 09/29/23  Out-of-pocket cost due at time of visit: $510  Primary: [employer]-FEP Prolia co-insurance: 30% Admin fee co-insurance: 30%  Secondary: --- Prolia co-insurance:  Admin fee co-insurance:   Medical Benefit Details: Date Benefits were checked: 09/02/23 Deductible: NO/ Coinsurance: 30%/ Admin Fee: 30%  Prior Auth: APPROVED PA# [id number]  Expiration Date: 12/03/22-01/02/24  Pharmacy benefit: Copay $255 If patient wants fill through the pharmacy benefit please send prescription to:  --- , and include estimated need by date in rx notes. Pharmacy will ship medication directly to the office.  Patient IS eligible for Prolia Copay Card. Copay Card can make patient's cost as little as $25. Link to apply: [email or web address]  ** This summary of benefits is an estimation of the patient's out-of-pocket cost. Exact cost may very based on individual plan coverage.

## 2023-10-04 ENCOUNTER — Other Ambulatory Visit: Payer: Self-pay | Admitting: Family Medicine

## 2023-10-04 ENCOUNTER — Telehealth: Payer: Self-pay

## 2023-10-04 DIAGNOSIS — Z1231 Encounter for screening mammogram for malignant neoplasm of breast: Secondary | ICD-10-CM

## 2023-10-04 NOTE — Progress Notes (Signed)
82 y.o. G68P2004 Widowed Caucasian female here for annual exam.    She is followed for vaginal atrophy.  She used Premarin cream twice weekly.  This helps with vaginal dryness and discomfort.   Her brothers have all passed away. She has a good support system.   She has a companion.   PCP: [name], MD   No LMP recorded (lmp unknown). Patient has had a hysterectomy.           Sexually active: No.  The current method of family planning is status post hysterectomy.    Exercising: Yes.     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  HIV: 2019 neg Hep C: 2019 neg  Immunization History  Administered Date(s) Administered   Fluad Quad(high Dose 65+) 08/28/2019, 11/24/2020, 09/09/2021   Fluad Trivalent(High Dose 65+) 09/22/2023   Influenza, High Dose Seasonal PF 10/21/2017, 11/27/2018, 09/15/2022   Influenza-Unspecified 10/13/2016   PFIZER Comirnaty(Gray Top)Covid-19 Tri-Sucrose Vaccine 05/31/2021   PFIZER(Purple Top)SARS-COV-2 Vaccination 01/12/2020, 02/05/2020, 11/07/2020   Pfizer Covid-19 Vaccine Bivalent Booster 51yrs & up 11/24/2021   Pfizer(Comirnaty)Fall Seasonal Vaccine 12 years and older 10/07/2022, 09/22/2023   Pneumococcal Conjugate-13 08/17/2018   Pneumococcal Polysaccharide-23 01/19/2016   Tdap 12/26/2009, 10/18/2021   Zoster Recombinant(Shingrix) 10/18/2021, 09/09/2022      reports that she has never smoked. She has never been exposed to  tobacco smoke. She has never used smokeless tobacco. She reports current alcohol use of about 2.0 standard drinks of alcohol per week. She reports that she does not use drugs.  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 Trial of PT/injection. If doesn't do well good candidate for reverse shoulder arthoplasty. Continue with PT (12/19). No specific f/u was scheduled.    Osteoarthritis    hands   Osteoporosis    Polymyalgia rheumatica (HCC)     Past Surgical History:  Procedure Laterality Date   ABDOMINAL HYSTERECTOMY     BREAST SURGERY  2016   benign mass removed in [place], [place].   FACIAL COSMETIC SURGERY     REVERSE SHOULDER ARTHROPLASTY Left 04/16/2020   Procedure: REVERSE SHOULDER ARTHROPLASTY;  Surgeon: Francena Hanly, MD;  Location: [facility];  Service: Orthopedics;  Laterality: Left;    TOTAL VAGINAL HYSTERECTOMY  age 27   --due to abnormal pap smears per patient    Current Outpatient Medications  Medication Sig Dispense Refill   acetaminophen (TYLENOL) 500 MG tablet Take 1,000 mg by mouth at bedtime.     amLODipine (NORVASC) 10 MG tablet Take 1 tablet (10 mg total) by mouth daily. 90 tablet 1   B Complex-Biotin-FA (B-COMPLEX PO) Take 1 tablet by mouth daily. Super     Calcium Carb-Cholecalciferol (CALCIUM CARBONATE-VITAMIN D3) 600-400 MG-UNIT TABS Take 1 tablet by mouth daily.      Cholecalciferol (VITAMIN D3) 50 MCG (2000 UT) TABS Take 2,000 mg by mouth daily.     cycloSPORINE (RESTASIS) 0.05 % ophthalmic emulsion SMARTSIG:In Eye(s)     denosumab  (PROLIA) 60 MG/ML SOSY injection Inject 60 mg into the skin every 6 (six) months.     Emollient (AMLACTIN ULTRA SMOOTHING) 15 % CREA as directed Externally     Hydrocortisone (CORTIBALM) 1 % STCK 1 application as needed Externally Three times a day     hydroxychloroquine (PLAQUENIL) 200 MG tablet Take 200mg  by mouth twice daily, Monday through Friday only. None on Saturday or Sunday. 40 tablet 2   lamoTRIgine (LAMICTAL) 150 MG tablet Take 1 tablet (150 mg total) by mouth daily. 90 tablet 1   losartan (COZAAR) 25 MG tablet Take 1 tablet (25 mg total) by mouth daily. 90 tablet 3   Multiple Vitamins-Minerals (MULTIVITAMIN PO) Take 1 tablet by mouth daily. Centrum silver     Omega-3 Fatty Acids (FISH OIL) 1000 MG CAPS Take 1,000 mg by mouth daily.      ondansetron (ZOFRAN) 4 MG tablet Take 4 mg by mouth every 8 (eight) hours as needed for nausea.     Polyethyl Glycol-Propyl Glycol (SYSTANE ULTRA OP) Place 1 drop into both eyes in the morning and at bedtime.      predniSONE (DELTASONE) 5 MG tablet Take 2 tablets then 1 tablet daily 12 tablet 0   White Petrolatum-Mineral Oil (SYSTANE NIGHTTIME) OINT Place 1 application into both eyes at bedtime as needed (Dry eye).      conjugated estrogens (PREMARIN) vaginal cream Place 1/2 gram per vagina at bedtime two to three times weekly. 30 g 1   valACYclovir (VALTREX) 1000 MG tablet Take two tablets ( total 2000 mg) by mouth q12h x 1 day; Start: ASAP after symptom onset 30 tablet 1   No current facility-administered medications for this visit.    Family History  Problem Relation Age of Onset   Congestive Heart Failure Mother    Stroke Mother    Diabetes Father    Colon cancer Brother    Diabetes Brother    Diabetes Brother    Stroke Brother    Colon cancer Daughter     Review of Systems  All other systems reviewed and are negative.   Exam:   BP (!) 112/48 (BP Location: Left Arm, Patient Position: Sitting, Cuff Size: Normal)   Pulse 87   Ht 5'  1.5" (1.562 m)   Wt 128 lb (58.1 kg)   LMP  (LMP Unknown)   SpO2 97%   BMI 23.79 kg/m     General appearance: alert, cooperative and appears stated age Head: normocephalic, without obvious abnormality, atraumatic Neck: no adenopathy, supple, symmetrical, trachea midline and thyroid normal to inspection and palpation Lungs: clear to auscultation bilaterally Breasts: normal appearance, no masses or tenderness, No nipple retraction or dimpling, No nipple discharge or bleeding, No axillary  adenopathy Heart: regular rate and rhythm Abdomen: soft, non-tender; no masses, no organomegaly Extremities: extremities normal, atraumatic, no cyanosis or edema Skin: skin color, texture, turgor normal. No rashes or lesions Lymph nodes: cervical, supraclavicular, and axillary nodes normal. Neurologic: grossly normal  Pelvic: External genitalia:  no lesions              No abnormal inguinal nodes palpated.              Urethra:  normal appearing urethra with no masses, tenderness or lesions              Bartholins and Skenes: normal                 Vagina: normal appearing vagina with normal color and discharge, no lesions              Cervix: absent              Pap taken:  no Bimanual Exam:  Uterus:  absent              Adnexa: no mass, fullness, tenderness              Rectal exam: yes.  Confirms.              Anus:  normal sphincter tone, no lesions  Chaperone was present for exam:  Warren Lacy, CMA   Assessment:  Well woman with gynecologic exam.  Status post TVH for abnormal paps in her 30s.  Ovaries remain.  Vaginal atrophy.  Encounter for medication monitoring. Osteoporosis.  On Prolia through PCP.  FH colon cancer in daughter and her brother. Hx HSV I.  Plan:  Mammogram screening discussed. Self breast awareness reviewed. Pap and HR HPV not indicated. BMD report reviewed.  She will continue Prolia through her PCP. Guidelines for Calcium, Vitamin D, regular exercise program including  cardiovascular and weight bearing exercise. Rx for Premarin cream.  I discussed potential effect on breast cancer. Rx for Valtrex. Fu yearly and prn.   After visit summary provided.

## 2023-10-04 NOTE — Telephone Encounter (Signed)
Prolia benefit investigation is complete it was just added to the old encounter (01/02/23). I routed the encounter to Hasna to finish up with the office part (contact patient, schedule).

## 2023-10-04 NOTE — Telephone Encounter (Signed)
-----   Message from Christine Scott sent at 10/01/2023  7:26 PM EDT ----- Please clarify-Prolia-looks like possibly 10/4-approved but notes are mixed into January.  Also, is pt aware?  Just waiting to schedule?

## 2023-10-04 NOTE — Telephone Encounter (Signed)
Noted  

## 2023-10-09 NOTE — Progress Notes (Signed)
Subjective:     Patient ID: Christine Scott, female    DOB: August 16, 1941, 82 y.o.   MRN: [id number]  No chief complaint on file.   HPI  HTN - Pt is on ***.  Bp's running ***.  No ha/dizziness/cp/palp/edema/cough/sob.  Osteoporosis - ***  *** - ***   Health Maintenance Due  Topic Date Due   DEXA SCAN  11/26/2021    Past Medical History:  Diagnosis Date   Abnormal Pap smear of cervix    --hx abnormal paps in her 30s and per patient this was reason for her Hysterectomy   Anxiety    Bursitis 12/26/2020   Depression    Dry eye    Dyspnea on exertion 10/07/2020   Encounter for herpes zoster vaccination 10/18/2021   History of depression 08/12/2017   Hypertension    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 Trial of PT/injection. If doesn't do well good candidate for reverse shoulder arthoplasty. Continue with PT (12/19). No specific f/u was scheduled.    Osteoarthritis    hands   Osteoporosis    Polymyalgia rheumatica (HCC)     Past Surgical History:  Procedure Laterality Date   ABDOMINAL HYSTERECTOMY     BREAST SURGERY  2016   benign mass removed in [place], [place].   FACIAL COSMETIC SURGERY     REVERSE SHOULDER ARTHROPLASTY Left 04/16/2020   Procedure: REVERSE SHOULDER ARTHROPLASTY;  Surgeon: Francena Hanly, MD;  Location: [facility];  Service: Orthopedics;  Laterality: Left;    TOTAL VAGINAL HYSTERECTOMY  age 23   --due to abnormal pap smears per patient     Current Outpatient Medications:    acetaminophen (TYLENOL) 500 MG tablet, Take 1,000 mg by mouth at bedtime., Disp: , Rfl:    amLODipine (NORVASC) 10 MG tablet, TAKE 1 TABLET BY MOUTH DAILY, Disp: 90 tablet, Rfl: 0   B Complex-Biotin-FA (B-COMPLEX PO), Take 1 tablet by mouth daily. Super, Disp: , Rfl:    Calcium Carb-Cholecalciferol (CALCIUM CARBONATE-VITAMIN D3) 600-400 MG-UNIT TABS, Take 1 tablet by mouth daily. , Disp: , Rfl:    Cholecalciferol (VITAMIN D3)  50 MCG (2000 UT) TABS, Take 2,000 mg by mouth daily., Disp: , Rfl:    conjugated estrogens (PREMARIN) vaginal cream, Place 1/2 gram per vagina at bedtime twice weekly., Disp: 30 g, Rfl: 1   hydroxychloroquine (PLAQUENIL) 200 MG tablet, Take 200mg  by mouth twice daily, Monday through Friday only. None on Saturday or Sunday., Disp: 40 tablet, Rfl: 2   lamoTRIgine (LAMICTAL) 150 MG tablet, Take 1 tablet (150 mg total) by mouth daily., Disp: 90 tablet, Rfl: 1   losartan (COZAAR) 25 MG tablet, Take 1 tablet (25 mg total) by mouth daily., Disp: 90 tablet, Rfl: 3   Multiple Vitamins-Minerals (MULTIVITAMIN PO), Take 1 tablet by mouth daily. Centrum silver, Disp: , Rfl:    Omega-3 Fatty Acids (FISH OIL) 1000 MG CAPS, Take 1,000 mg by mouth daily. , Disp: , Rfl:    ondansetron (ZOFRAN) 4 MG tablet, Take 4 mg by mouth every 8 (eight) hours as needed for nausea., Disp: , Rfl:    Polyethyl Glycol-Propyl Glycol (SYSTANE ULTRA OP), Place 1 drop into both eyes in the morning and at bedtime. , Disp: , Rfl:    predniSONE (DELTASONE) 5 MG tablet, Take 4 tabs po qd x 2 days, 3  tabs po qd x 2 days, 2  tabs po qd x 2 days, 1  tab  po qd x 2 days, Disp: 20 tablet, Rfl: 0   valACYclovir (VALTREX) 1000 MG tablet, Take two tablets ( total 2000 mg) by mouth q12h x 1 day; Start: ASAP after symptom onset (Patient not taking: Reported on 09/15/2023), Disp: 6 tablet, Rfl: 2   White Petrolatum-Mineral Oil (SYSTANE NIGHTTIME) OINT, Place 1 application into both eyes at bedtime as needed (Dry eye). , Disp: , Rfl:   No Known Allergies ROS neg/noncontributory except as noted HPI/below      Objective:     LMP  (LMP Unknown)  Wt Readings from Last 3 Encounters:  09/15/23 124 lb (56.2 kg)  09/08/23 128 lb (58.1 kg)  08/04/23 127 lb (57.6 kg)    Physical Exam   Gen: WDWN NAD HEENT: NCAT, conjunctiva not injected, sclera nonicteric NECK:  supple, no thyromegaly, no nodes, no carotid bruits CARDIAC: RRR, S1S2+, no murmur. DP  2+B LUNGS: CTAB. No wheezes ABDOMEN:  BS+, soft, NTND, No HSM, no masses EXT:  no edema MSK: no gross abnormalities.  NEURO: A&O x3.  CN II-XII intact.  PSYCH: normal mood. Good eye contact     Assessment & Plan:  There are no diagnoses linked to this encounter.  No follow-ups on file.   I, Isabelle Course, acting as a scribe for Angelena Sole, MD., have documented all relevant documentation on the behalf of Angelena Sole, MD, as directed by  Angelena Sole, MD while in the presence of Angelena Sole, MD.  I, Isabelle Course, have reviewed all documentation for this visit. The documentation on 10/09/23 for the exam, diagnosis, procedures, and orders are all accurate and complete.  ***   Isabelle Course

## 2023-10-10 ENCOUNTER — Encounter: Payer: Self-pay | Admitting: Family Medicine

## 2023-10-10 ENCOUNTER — Ambulatory Visit: Payer: Federal, State, Local not specified - PPO | Admitting: Family Medicine

## 2023-10-10 ENCOUNTER — Ambulatory Visit: Payer: Federal, State, Local not specified - PPO | Admitting: Physician Assistant

## 2023-10-10 VITALS — BP 121/71 | HR 72 | Temp 97.9°F | Resp 16 | Ht 60.0 in | Wt 127.2 lb

## 2023-10-10 DIAGNOSIS — I10 Essential (primary) hypertension: Secondary | ICD-10-CM | POA: Diagnosis not present

## 2023-10-10 DIAGNOSIS — R413 Other amnesia: Secondary | ICD-10-CM

## 2023-10-10 DIAGNOSIS — M810 Age-related osteoporosis without current pathological fracture: Secondary | ICD-10-CM | POA: Diagnosis not present

## 2023-10-10 MED ORDER — DENOSUMAB 60 MG/ML ~~LOC~~ SOSY
60.0000 mg | PREFILLED_SYRINGE | Freq: Once | SUBCUTANEOUS | Status: AC
Start: 2023-10-10 — End: 2023-10-10
  Administered 2023-10-10: 60 mg via SUBCUTANEOUS

## 2023-10-10 MED ORDER — AMLODIPINE BESYLATE 10 MG PO TABS
10.0000 mg | ORAL_TABLET | Freq: Every day | ORAL | 1 refills | Status: DC
Start: 2023-10-10 — End: 2024-04-23

## 2023-10-10 NOTE — Patient Instructions (Addendum)
It was very nice to see you today!  Log the getting lost events and what occurred within 24 hours  Get bone density scheduled   PLEASE NOTE:  If you had any lab tests please let us know if you have not heard back within a few days. You may see your results on MyChart before we have a chance to review them but we will give you a call once they are reviewed by [place]. If we ordered any referrals today, please let us know if you have not heard from their office within the next week.   Please try these tips to maintain a healthy lifestyle:  Eat most of your calories during the day when you are active. Eliminate processed foods including packaged sweets (pies, cakes, cookies), reduce intake of potatoes, white bread, white pasta, and white rice. Look for whole grain options, oat flour or almond flour.  Each meal should contain half fruits/vegetables, one quarter protein, and one quarter carbs (no bigger than a computer mouse).  Cut down on sweet beverages. This includes juice, soda, and sweet tea. Also watch fruit intake, though this is a healthier sweet option, it still contains natural sugar! Limit to 3 servings daily.  Drink at least 1 glass of water with each meal and aim for at least 8 glasses per day  Exercise at least 150 minutes every week.

## 2023-10-10 NOTE — Assessment & Plan Note (Signed)
Chronic.  Patient walks regularly.  Taking calcium and vitamin D.  Getting Prolia every 6 months for several years.  Due for bone density

## 2023-10-10 NOTE — Assessment & Plan Note (Signed)
Chronic.  Controlled.  Continue losartan 25 mg and amlodipine 10 mg daily.  Managed by cardiology as well

## 2023-10-15 ENCOUNTER — Telehealth: Payer: Self-pay | Admitting: Internal Medicine

## 2023-10-15 ENCOUNTER — Other Ambulatory Visit: Payer: Self-pay | Admitting: Physician Assistant

## 2023-10-15 DIAGNOSIS — J020 Streptococcal pharyngitis: Secondary | ICD-10-CM

## 2023-10-15 DIAGNOSIS — M3501 Sicca syndrome with keratoconjunctivitis: Secondary | ICD-10-CM

## 2023-10-15 DIAGNOSIS — M353 Polymyalgia rheumatica: Secondary | ICD-10-CM

## 2023-10-15 MED ORDER — PREDNISONE 5 MG PO TABS
ORAL_TABLET | ORAL | 0 refills | Status: DC
Start: 2023-10-15 — End: 2023-11-07

## 2023-10-15 NOTE — Telephone Encounter (Signed)
I spoke with Christine Scott she called due to severe increase in joint pain and swelling affecting both hands. Symptoms have been worsening since 3 days ago. This morning was much worse and with visible swelling and inability to tightly close her grip. She completed a prednisone taper after her last appointment symptoms improved greatly within a few days. I sent a new prednisone prescription with recommendation to resume this taking 10 mg today and then 5 mg daily again for another 10 days or until discussing any longer plan with Christine Scott or Christine Scott.

## 2023-10-16 NOTE — Telephone Encounter (Signed)
Please advise on refill requested

## 2023-10-16 NOTE — Telephone Encounter (Signed)
Patient contacted the office and states that she wanted to make sure Dr. Corliss Skains was okay with her taking the prednisone the way Dr. Dimple Casey prescribed it. I advised the patient Dr. Corliss Skains was out of office today and I could send a message to [facility]. Patient also inquires if she should be seen sooner than she is scheduled for. Advised the patient I would send the message and we would get back to her.

## 2023-10-16 NOTE — Telephone Encounter (Signed)
Ok to complete prednisone taper as prescribed by Dr. Dimple Casey.   Please clarify if she has started taking plaquenil initiated after her last visit on 09/15/23? It can take a full 3 months to notice the maximum improvement with the use of plaquenil so she may just need more time. The prednisone should help to alleviate her symptoms and to bridge her until the plaquenil kicks in. Ok to keep scheduled follow up visit with Dr. Corliss Skains on 11/07/23

## 2023-10-17 ENCOUNTER — Ambulatory Visit (HOSPITAL_BASED_OUTPATIENT_CLINIC_OR_DEPARTMENT_OTHER)
Admission: RE | Admit: 2023-10-17 | Discharge: 2023-10-17 | Disposition: A | Discharge status: Home / Self Care | Payer: Federal, State, Local not specified - PPO | Source: Ambulatory Visit | Attending: Family Medicine | Admitting: Family Medicine

## 2023-10-17 DIAGNOSIS — M810 Age-related osteoporosis without current pathological fracture: Secondary | ICD-10-CM | POA: Diagnosis present

## 2023-10-17 NOTE — Telephone Encounter (Signed)
I would recommend continuing on current dose of plaquenil since she is currently having a flare.

## 2023-10-17 NOTE — Telephone Encounter (Signed)
Patient advised Christine Scott would recommend continuing on current dose of plaquenil since she is currently having a flare. Patient verbalized understanding.

## 2023-10-17 NOTE — Telephone Encounter (Signed)
Attempted to contact the patient and left a message to call the office back. 

## 2023-10-17 NOTE — Telephone Encounter (Signed)
Patient advised Ok to complete prednisone taper as prescribed by Dr. Dimple Casey.    Please clarify if she has started taking plaquenil initiated after her last visit on 09/15/23? It can take a full 3 months to notice the maximum improvement with the use of plaquenil so she may just need more time. The prednisone should help to alleviate her symptoms and to bridge her until the plaquenil kicks in. Ok to keep scheduled follow up visit with Dr. Corliss Skains on 11/07/23.   Patient states she has been taking the hydroxychloroquine as prescribed. Patient states she knows the dose on the bottle is Take 200mg  by mouth twice daily, Monday through Friday only. None on Saturday or Sunday. Patient states she believes she was told in office that she could take one tablet a day. After looking through the chart of the last visit on 09/15/2023, there is a note in the chart that states We may reduce the dose of Plaquenil in the future if arthritis improves. Please advise.

## 2023-10-18 ENCOUNTER — Ambulatory Visit (INDEPENDENT_AMBULATORY_CARE_PROVIDER_SITE_OTHER): Payer: Federal, State, Local not specified - PPO | Admitting: Obstetrics and Gynecology

## 2023-10-18 ENCOUNTER — Encounter: Payer: Self-pay | Admitting: Obstetrics and Gynecology

## 2023-10-18 VITALS — BP 112/48 | HR 87 | Ht 61.5 in | Wt 128.0 lb

## 2023-10-18 DIAGNOSIS — N952 Postmenopausal atrophic vaginitis: Secondary | ICD-10-CM | POA: Diagnosis not present

## 2023-10-18 DIAGNOSIS — B009 Herpesviral infection, unspecified: Secondary | ICD-10-CM

## 2023-10-18 DIAGNOSIS — Z5181 Encounter for therapeutic drug level monitoring: Secondary | ICD-10-CM

## 2023-10-18 DIAGNOSIS — Z01419 Encounter for gynecological examination (general) (routine) without abnormal findings: Secondary | ICD-10-CM

## 2023-10-18 MED ORDER — VALACYCLOVIR HCL 1 G PO TABS: ORAL_TABLET | ORAL | 1 refills | Status: DC

## 2023-10-18 MED ORDER — PREMARIN 0.625 MG/GM VA CREA: TOPICAL_CREAM | VAGINAL | 1 refills | Status: DC

## 2023-10-18 NOTE — Patient Instructions (Signed)

## 2023-10-27 NOTE — Progress Notes (Signed)
Office Visit Note  Patient: Christine Scott             Date of Birth: October 01, 1941           MRN: [id number]             PCP: [name], MD Referring: [name], MD Visit Date: 11/07/2023 Occupation: @GUAROCC @  Subjective:  Pain and swelling in both hands  History of Present Illness: Christine Scott is a 82 y.o. female with polymyalgia rheumatica, inflammatory arthritis and Sjogren's.  She had a flare with increased pain and swelling in her hands on October 15, 2023.  She was given a prednisone taper in October.  She states symptoms resolved after the prednisone taper and then the stiffness and pain recurred after the prednisone was she had been taking hydroxychloroquine since September 23, 2023.  She has noted improvement in her hand swelling.  Although she still continues to have pain and stiffness in her both hands and her feet.  She also has been having intermittent lower back pain.    Activities of Daily Living:  Patient reports morning stiffness for all day. Patient Reports nocturnal pain.  Difficulty dressing/grooming: Denies Difficulty climbing stairs: Denies Difficulty getting out of chair: Denies Difficulty using hands for taps, buttons, cutlery, and/or writing: Reports  Review of Systems  Constitutional:  Positive for fatigue.  HENT:  Positive for mouth dryness. Negative for mouth sores.   Eyes:  Positive for dryness.  Respiratory:  Negative for shortness of breath.   Cardiovascular:  Negative for chest pain and palpitations.  Gastrointestinal:  Negative for blood in stool, constipation and diarrhea.  Endocrine: Negative for increased urination.  Genitourinary:  Negative for involuntary urination.  Musculoskeletal:  Positive for joint pain, joint pain, joint swelling, myalgias, muscle weakness, morning stiffness, muscle tenderness and myalgias. Negative for gait problem.  Skin:  Negative for color change, rash, hair loss and sensitivity to sunlight.   Allergic/Immunologic: Negative for susceptible to infections.  Neurological:  Positive for headaches. Negative for dizziness.  Hematological:  Negative for swollen glands.  Psychiatric/Behavioral:  Negative for depressed mood and sleep disturbance. The patient is not nervous/anxious.     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   Greater trochanteric bursitis of left hip 07/04/2019   DDD (degenerative disc disease), lumbar 02/25/2019   Bipolar II disorder (HCC) 12/07/2018   Essential hypertension 08/12/2017   Lumbar radiculopathy 06/15/2017   Sjogren's syndrome 12/25/2016   Polymyalgia rheumatica (HCC) 12/25/2016   Primary osteoarthritis of both hands 12/25/2016   Primary osteoarthritis of both feet 12/25/2016   Osteoporosis 12/25/2016    Past Medical History:  Diagnosis Date   Abnormal Pap smear of cervix    --hx abnormal paps in her 30s and per patient this was reason for her Hysterectomy   Anxiety    Bursitis 12/26/2020   Depression    Dry eye    Dyspnea on exertion 10/07/2020   Encounter for herpes zoster vaccination 10/18/2021   History of depression 08/12/2017   HSV-1 infection    Hypertension    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 Trial of PT/injection. If doesn't do well good candidate for reverse shoulder arthoplasty. Continue with PT (12/19). No specific f/u was scheduled.    Osteoarthritis    hands   Osteoporosis  Polymyalgia rheumatica (HCC)     Family History  Problem Relation Age of Onset   Congestive Heart Failure Mother    Stroke Mother    Diabetes Father    Colon cancer Brother    Diabetes Brother    Diabetes Brother    Stroke Brother    Heart attack Brother    Colon cancer Daughter    BRCA 1/2 Neg Hx    Breast cancer Neg Hx    Past Surgical History:  Procedure  Laterality Date   ABDOMINAL HYSTERECTOMY     BREAST SURGERY  2016   benign mass removed in [place], [place].   FACIAL COSMETIC SURGERY     REVERSE SHOULDER ARTHROPLASTY Left 04/16/2020   Procedure: REVERSE SHOULDER ARTHROPLASTY;  Surgeon: Francena Hanly, MD;  Location: [facility];  Service: Orthopedics;  Laterality: Left;    TOTAL VAGINAL HYSTERECTOMY  age 48   --due to abnormal pap smears per patient   Social History   Social History Narrative   Right handed   Lives alone   Immunization History  Administered Date(s) Administered   Fluad Quad(high Dose 65+) 08/28/2019, 11/24/2020, 09/09/2021   Fluad Trivalent(High Dose 65+) 09/22/2023   Influenza, High Dose Seasonal PF 10/21/2017, 11/27/2018, 09/15/2022   Influenza-Unspecified 10/13/2016   PFIZER Comirnaty(Gray Top)Covid-19 Tri-Sucrose Vaccine 05/31/2021   PFIZER(Purple Top)SARS-COV-2 Vaccination 01/12/2020, 02/05/2020, 11/07/2020   Pfizer Covid-19 Vaccine Bivalent Booster 60yrs & up 11/24/2021   Pfizer(Comirnaty)Fall Seasonal Vaccine 12 years and older 10/07/2022, 09/22/2023   Pneumococcal Conjugate-13 08/17/2018   Pneumococcal Polysaccharide-23 01/19/2016   Tdap 12/26/2009, 10/18/2021   Zoster Recombinant(Shingrix) 10/18/2021, 09/09/2022     Objective: Vital Signs: BP (!) 140/72 (BP Location: Left Arm, Patient Position: Sitting, Cuff Size: Normal)   Pulse 73   Resp 13   Ht 5' (1.524 m)   Wt 125 lb 9.6 oz (57 kg)   LMP  (LMP Unknown)   BMI 24.53 kg/m    Physical Exam Vitals and nursing note reviewed.  Constitutional:      Appearance: She is well-developed.  HENT:     Head: Normocephalic and atraumatic.  Eyes:     Conjunctiva/sclera: Conjunctivae normal.  Cardiovascular:     Rate and Rhythm: Normal rate and regular rhythm.     Heart sounds: Normal heart sounds.  Pulmonary:     Effort: Pulmonary effort is normal.     Breath sounds: Normal breath sounds.  Abdominal:     General: Bowel sounds are normal.      Palpations: Abdomen is soft.  Musculoskeletal:     Cervical back: Normal range of motion.  Lymphadenopathy:     Cervical: No cervical adenopathy.  Skin:    General: Skin is warm and dry.     Capillary Refill: Capillary refill takes less than 2 seconds.  Neurological:     Mental Status: She is alert and oriented to person, place, and time.  Psychiatric:        Behavior: Behavior normal.      Musculoskeletal Exam: She had limited lateral rotation of the cervical spine.  Thoracic kyphosis was noted.  She had no tenderness over thoracic or lumbar spine.  Shoulders were in good range of motion with some discomfort.  Elbow joints and wrist joints were in good range of motion.  She has synovitis over MCPs and PIPs as described below.  She good range of motion of her hip joints and knee joints.  There was no tenderness over ankles or MTPs.  CDAI Exam:  CDAI Score: -- Patient Global: --; Provider Global: -- Swollen: 9 ; Tender: 11  Joint Exam 11/07/2023      Right  Left  Glenohumeral   Tender   Tender  MCP 1     Swollen Tender  MCP 2  Swollen Tender  Swollen Tender  MCP 3  Swollen Tender     MCP 5  Swollen Tender     PIP 2 (finger)  Swollen Tender     PIP 3 (finger)     Swollen Tender  PIP 4 (finger)  Swollen Tender     PIP 5 (finger)  Swollen Tender        Investigation: No additional findings.  Imaging: MM 3D SCREENING MAMMOGRAM BILATERAL BREAST  Result Date: 11/07/2023 CLINICAL DATA:  Screening. EXAM: DIGITAL SCREENING BILATERAL MAMMOGRAM WITH TOMOSYNTHESIS AND CAD TECHNIQUE: Bilateral screening digital craniocaudal and mediolateral oblique mammograms were obtained. Bilateral screening digital breast tomosynthesis was performed. The images were evaluated with computer-aided detection. COMPARISON:  Previous exam(s). ACR Breast Density Category b: There are scattered areas of fibroglandular density. FINDINGS: There are no findings suspicious for malignancy. IMPRESSION: No  mammographic evidence of malignancy. A result letter of this screening mammogram will be mailed directly to the patient. RECOMMENDATION: Screening mammogram in one year. (Code:SM-B-01Y) BI-RADS CATEGORY  1: Negative. Electronically Signed   By: Elberta Fortis M.D.   On: 11/07/2023 13:14   DG BONE DENSITY (DXA)  Result Date: 10/17/2023 EXAM: DUAL X-RAY ABSORPTIOMETRY (DXA) FOR BONE MINERAL DENSITY IMPRESSION: Referring Physician:  ANN MARIE KULIK Your patient completed a bone mineral density test using GE Lunar iDXA system (analysis version: 16). Technologist: ALW PATIENT: Name: Dachelle, Lilja Patient ID: [id number] Birth Date: 05/09/1941 Height: 60.0 in. Sex: Female Measured: 10/17/2023 Weight: 125.0 lbs. Indications: Advanced Age, Caucasian, Estrogen Deficiency, Family Hist. (Parent hip fracture), Family History of Osteoporosis, Height Loss, History of osteoporosis, Hysterectomy, Lamictal, Polymyalgia Rheumatica, Post Menopausal, Prednisone Fractures: Treatments: Calcium, Estrace Vaginal Cream, Vitamin D, Prolia ASSESSMENT: The BMD measured at Left Forearm Radius 33% is 0.555 g/cm2 with a T-score of -3.7. This patient is considered osteoporotic according to World Health Organization [facility]) criteria. The scan quality is good. Lumbar Spine excluded due to degenerative changes. Site Region Measured Date Measured Age YA BMD Significant CHANGE T-score Left Forearm Radius 33% 10/17/2023 83.4 -3.7 0.555 g/cm2 DualFemur Total Right 10/17/2023 83.4 -2.0 0.759 g/cm2 DualFemur Total Mean 10/17/2023 83.4 -1.5 0.817 g/cm2 World Health [facility]) criteria for post-menopausal, Caucasian Women: Normal       T-score at or above -1 SD Osteopenia   T-score between -1 and -2.5 SD Osteoporosis T-score at or below -2.5 SD RECOMMENDATION: 1. All patients should optimize calcium and vitamin D intake. 2. Consider FDA approved medical therapies in postmenopausal women and men aged 43 years and older, based on the following: a. A  hip or vertebral (clinical or morphometric) fracture b. T-score = -2.5 at the femoral neck or spine after appropriate evaluation to exclude secondary causes c. Low bone mass (T-score between -1.0 and -2.5 at the femoral neck or spine) and a 10- year probability of a hip fracture = 3% or a 10 year probability of a major osteoporosis-related fracture = 20% based on the [place]-adapted WHO algorithm. 3. Clinician judgement and/or patient preference may indicate treatment for people with10-year fracture probabilities above or below these levels. FOLLOW-UP: Patients with diagnosis of osteoporosis or at high risk for fracture should have regular bone mineral density tests. For patients eligible for Medicare  routine testing is allowed once every 2 years. The testing frequency can be increased to one year for patients who have rapidly progressing disease, those who are receiving or discontinuing medical therapy to restore bone mass, or have additional risk factors. I have reviewed this study and agree with the findings. [facility] Electronically Signed   By: Romona Curls M.D.   On: 10/17/2023 13:34    Recent Labs: Lab Results  Component Value Date   WBC 9.4 09/15/2023   HGB 11.8 09/15/2023   PLT 295 09/15/2023   NA 137 09/15/2023   K 4.7 09/15/2023   CL 100 09/15/2023   CO2 27 09/15/2023   GLUCOSE 87 09/15/2023   BUN 16 09/15/2023   CREATININE 0.89 09/15/2023   BILITOT 0.5 09/15/2023   ALKPHOS 58 04/15/2021   AST 16 09/15/2023   ALT 10 09/15/2023   PROT 7.0 09/15/2023   ALBUMIN 4.1 04/15/2021   CALCIUM 10.2 09/15/2023   GFRAA >60 04/13/2020    Speciality Comments: PLQ Eye Exam: 10/06/2023 WNL @ [facility]   Osteoporosis managed by PCP.  Procedures:  No procedures performed Allergies: Patient has no known allergies.   Assessment / Plan:     Visit Diagnoses: Polymyalgia rheumatica (HCC) -intermission.  She had no muscular weakness or tenderness on the  examination.  Sjogren's syndrome, with unspecified organ involvement (HCC) - history of positive ANA, positive SSA antibody and hypocomplementemia.  Sicca symptoms are manageable.  She continues to have dry mouth and dry eyes.  Inflammatory arthritis-she presented with joint pain and joint swelling in multiple joints in September 2024.  She was given a prednisone taper and she was started on hydroxychloroquine.  She had another flare in mid October requiring another prednisone taper.  She presents today with ongoing pain and swelling in multiple joints.  She had synovitis in several of her MCPs and PIP joints.  She had discomfort in her bilateral shoulder joints.  She states that hydroxychloroquine is not effective.  I did detailed discussion with the patient regarding possible use of methotrexate.  Indications side effects contraindications were discussed at length.  A handout was given and consent was taken.  My plan is to start her on methotrexate 3 tablets p.o. weekly along with folic acid 1 mg p.o. daily after the lab results are available.  We will check labs in 2 weeks and then every 3 months.  I will also give her a low-dose gradual prednisone taper starting with 10 mg p.o. daily and taper by 2.5 mg p.o. daily over the next month.  She was advised to continue hydroxychloroquine.  We may discontinue hydroxychloroquine in the future if methotrexate is effective.  Drug Counseling TB Gold: Pending Hepatitis panel: Pending  Chest-xray: December 08, 2020  Contraception: Not indicated  Alcohol use: Reported occasional use of wine  Patient was counseled on the purpose, proper use, and adverse effects of methotrexate including nausea, infection, and signs and symptoms of pneumonitis.  Reviewed instructions with patient to take methotrexate weekly along with folic acid daily.  Discussed the importance of frequent monitoring of kidney and liver function and blood counts, and provided patient with  standing lab instructions.  Counseled patient to avoid NSAIDs and alcohol while on methotrexate.  Provided patient with educational materials on methotrexate and answered all questions.  Advised patient to get annual influenza vaccine and to get a pneumococcal vaccine if patient has not already had one.  Patient voiced understanding.  Patient consented to methotrexate  use.  Will upload into chart.     High risk medication use - hydroxychloroquine 200 mg p.o. twice daily Monday to Friday only. PLQ Eye Exam: 10/06/2023 - Plan: CBC with Differential/Platelet, COMPLETE METABOLIC PANEL WITH GFR, Hepatitis B core antibody, IgM, Hepatitis B surface antigen, Hepatitis C antibody, QuantiFERON-TB Gold Plus, Serum protein electrophoresis with reflex, IgG, IgA, IgM  History of left shoulder replacement - She had reverse shoulder arthroplasty by Dr. Rennis Chris in April 2021.  She had painful range of motion of bilateral shoulders.  Pain in both hands -she complains of pain and discomfort in the bilateral hands.  Synovitis was noted over several of the MCPs and PIP joints as described above.  X-rays obtained in September of bilateral hands consistent with rheumatoid arthritis and inflammatory arthritis overlap. Cystic versus erosive changes were noted in the carpal bones  Primary osteoarthritis of both hands-she has bilateral CMC PIP and DIP thickening consistent with osteoarthritis.  Trochanteric bursitis, left hip-she has intermittent discomfort.  She was asymptomatic today.  Primary osteoarthritis of both feet -she denies discomfort.  No synovitis was noted on the examination today.  X-rays obtained in September of bilateral feet were consistent with osteoarthritis.  DDD (degenerative disc disease), cervical-she had limited painful range of motion of the cervical spine.  Spondylosis of lumbar spine-she is intermittent lower back pain.  Osteoporosis - Her osteoporosis has been managed by her PCP.  Patient has not  had a DEXA scan since 2020.  Patient is on Prolia injections by her PCP.  Vitamin D deficiency  History of hypertension-blood pressure was weighted at 140/72.  She was advised to monitor blood pressure closely.  History of depression  Memory loss   Orders: Orders Placed This Encounter  Procedures   CBC with Differential/Platelet   COMPLETE METABOLIC PANEL WITH GFR   Hepatitis B core antibody, IgM   Hepatitis B surface antigen   Hepatitis C antibody   QuantiFERON-TB Gold Plus   Serum protein electrophoresis with reflex   IgG, IgA, IgM   No orders of the defined types were placed in this encounter.    Follow-Up Instructions: Return in about 3 months (around 02/07/2024) for PMR, Sjogren's, Inflammatory arthritis.   Pollyann Savoy, MD  Note - This record has been created using Animal nutritionist.  Chart creation errors have been sought, but may not always  have been located. Such creation errors do not reflect on  the standard of medical care.

## 2023-11-03 ENCOUNTER — Ambulatory Visit
Admission: RE | Admit: 2023-11-03 | Discharge: 2023-11-03 | Disposition: A | Discharge status: Home / Self Care | Payer: Federal, State, Local not specified - PPO | Source: Ambulatory Visit | Attending: Family Medicine | Admitting: Family Medicine

## 2023-11-03 DIAGNOSIS — Z1231 Encounter for screening mammogram for malignant neoplasm of breast: Secondary | ICD-10-CM

## 2023-11-06 NOTE — Telephone Encounter (Signed)
Received EOB for pt's Prolia and uploaded it to [employer] program portal.

## 2023-11-07 ENCOUNTER — Ambulatory Visit: Payer: Federal, State, Local not specified - PPO | Attending: Rheumatology | Admitting: Rheumatology

## 2023-11-07 ENCOUNTER — Encounter: Payer: Self-pay | Admitting: Rheumatology

## 2023-11-07 ENCOUNTER — Other Ambulatory Visit: Payer: Self-pay

## 2023-11-07 VITALS — BP 140/72 | HR 73 | Resp 13 | Ht 60.0 in | Wt 125.6 lb

## 2023-11-07 DIAGNOSIS — M353 Polymyalgia rheumatica: Secondary | ICD-10-CM

## 2023-11-07 DIAGNOSIS — M3500 Sicca syndrome, unspecified: Secondary | ICD-10-CM | POA: Diagnosis not present

## 2023-11-07 DIAGNOSIS — M1990 Unspecified osteoarthritis, unspecified site: Secondary | ICD-10-CM

## 2023-11-07 DIAGNOSIS — R413 Other amnesia: Secondary | ICD-10-CM

## 2023-11-07 DIAGNOSIS — M7062 Trochanteric bursitis, left hip: Secondary | ICD-10-CM

## 2023-11-07 DIAGNOSIS — M810 Age-related osteoporosis without current pathological fracture: Secondary | ICD-10-CM

## 2023-11-07 DIAGNOSIS — Z79899 Other long term (current) drug therapy: Secondary | ICD-10-CM | POA: Diagnosis not present

## 2023-11-07 DIAGNOSIS — Z8679 Personal history of other diseases of the circulatory system: Secondary | ICD-10-CM

## 2023-11-07 DIAGNOSIS — M1380 Other specified arthritis, unspecified site: Secondary | ICD-10-CM

## 2023-11-07 DIAGNOSIS — E559 Vitamin D deficiency, unspecified: Secondary | ICD-10-CM

## 2023-11-07 DIAGNOSIS — M47816 Spondylosis without myelopathy or radiculopathy, lumbar region: Secondary | ICD-10-CM

## 2023-11-07 DIAGNOSIS — M5030 Other cervical disc degeneration, unspecified cervical region: Secondary | ICD-10-CM

## 2023-11-07 DIAGNOSIS — Z8659 Personal history of other mental and behavioral disorders: Secondary | ICD-10-CM

## 2023-11-07 DIAGNOSIS — Z96612 Presence of left artificial shoulder joint: Secondary | ICD-10-CM

## 2023-11-07 DIAGNOSIS — M19072 Primary osteoarthritis, left ankle and foot: Secondary | ICD-10-CM

## 2023-11-07 DIAGNOSIS — M79641 Pain in right hand: Secondary | ICD-10-CM

## 2023-11-07 DIAGNOSIS — M19071 Primary osteoarthritis, right ankle and foot: Secondary | ICD-10-CM

## 2023-11-07 DIAGNOSIS — M19041 Primary osteoarthritis, right hand: Secondary | ICD-10-CM

## 2023-11-07 DIAGNOSIS — M19042 Primary osteoarthritis, left hand: Secondary | ICD-10-CM

## 2023-11-07 DIAGNOSIS — M79642 Pain in left hand: Secondary | ICD-10-CM

## 2023-11-07 MED ORDER — PREDNISONE 5 MG PO TABS: ORAL_TABLET | ORAL | 0 refills | Status: DC

## 2023-11-07 NOTE — Telephone Encounter (Signed)
Pending lab results, patient will be starting methotrexate tablets and folic acid per Dr. Corliss Skains.   Patient's pharmacy is [facility] [id number] - [address], [place] - [address]   Thanks!

## 2023-11-07 NOTE — Patient Instructions (Signed)
Methotrexate Tablets What is this medication? METHOTREXATE (METH oh TREX ate) treats autoimmune conditions, such as arthritis and psoriasis. It works by decreasing inflammation, which can reduce pain and prevent long-term injury to the joints and skin. It may also be used to treat some types of cancer. It works by slowing down the growth of cancer cells. This medicine may be used for other purposes; ask your health care provider or pharmacist if you have questions. COMMON BRAND NAME(S): Rheumatrex, Trexall What should I tell my care team before I take this medication? They need to know if you have any of these conditions: Dehydration Diabetes Fluid in the stomach area or lungs Frequently drink alcohol Having surgery, including dental surgery High cholesterol Immune system problems Inflammatory bowel disease, such as ulcerative colitis Kidney disease Liver disease Low blood cell levels (white cells, red cells, and platelets) Lung disease Recent or ongoing radiation Recent or upcoming vaccine Stomach ulcers, other stomach or intestine problems An unusual or allergic reaction to methotrexate, other medications, foods, dyes, or preservatives Pregnant or trying to get pregnant Breastfeeding How should I use this medication? Take this medication by mouth with water. Take it as directed on the prescription label. Do not take extra. Keep taking this medication until your care team tells you to stop. Know why you are taking this medication and how you should take it. To treat conditions such as arthritis and psoriasis, this medication is taken ONCE A WEEK as a single dose or divided into 3 smaller doses taken 12 hours apart (do not take more than 3 doses 12 hours apart each week). This medication is NEVER taken daily to treat conditions other than cancer. Taking this medication more often than directed can cause serious side effects, even death. Talk to your care team about why you are taking this  medication, how often you will take it, and what your dose is. Ask your care team to put the reason you take this medication on the prescription. If you take this medication ONCE A WEEK, choose a day of the week before you start. Ask your pharmacist to include the day of the week on the label. Avoid "Monday", which could be misread as "Morning". Handling this medication may be harmful. Talk to your care team about how to handle this medication. Special instructions may apply. Talk to your care team about the use of this medication in children. While it may be prescribed for selected conditions, precautions do apply. Overdosage: If you think you have taken too much of this medicine contact a poison control center or emergency room at once. NOTE: This medicine is only for you. Do not share this medicine with others. What if I miss a dose? If you miss a dose, talk with your care team. Do not take double or extra doses. What may interact with this medication? Do not take this medication with any of the following: Acitretin Live virus vaccines Probenecid This medication may also interact with the following: Alcohol Aspirin and aspirin-like medications Certain antibiotics, such as penicillin, neomycin, sulfamethoxazole; trimethoprim Certain medications for stomach problems, such as lansoprazole, omeprazole, pantoprazole Clozapine Cyclosporine Dapsone Folic acid Foscarnet NSAIDs, medications for pain and inflammation, such as ibuprofen or naproxen Phenytoin Pyrimethamine Steroid medications, such as prednisone or cortisone Tacrolimus Theophylline This list may not describe all possible interactions. Give your health care provider a list of all the medicines, herbs, non-prescription drugs, or dietary supplements you use. Also tell them if you smoke, drink alcohol, or use  illegal drugs. Some items may interact with your medicine. What should I watch for while using this medication? Visit your  care team for regular checks on your progress. It may be some time before you see the benefit from this medication. You may need blood work done while you are taking this medication. If your care team has also prescribed folic acid, they may instruct you to skip your folic acid dose on the day you take methotrexate. This medication can make you more sensitive to the sun. Keep out of the sun. If you cannot avoid being in the sun, wear protective clothing and sunscreen. Do not use sun lamps, tanning beds, or tanning booths. Check with your care team if you have severe diarrhea, nausea, and vomiting, or if you sweat a lot. The loss of too much body fluid may make it dangerous for you to take this medication. This medication may increase your risk of getting an infection. Call your care team for advice if you get a fever, chills, sore throat, or other symptoms of a cold or flu. Do not treat yourself. Try to avoid being around people who are sick. Talk to your care team about your risk of cancer. You may be more at risk for certain types of cancers if you take this medication. Talk to your care team if you or your partner may be pregnant. Serious birth defects can occur if you take this medication during pregnancy and for 6 months after the last dose. You will need a negative pregnancy test before starting this medication. Contraception is recommended while taking this medication and for 6 months after the last dose. Your care team can help you find the option that works for you. If your partner can get pregnant, use a condom during sex while taking this medication and for 3 months after the last dose. Do not breastfeed while taking this medication and for 1 week after the last dose. This medication may cause infertility. Talk to your care team if you are concerned about your fertility. What side effects may I notice from receiving this medication? Side effects that you should report to your care team as soon  as possible: Allergic reactions--skin rash, itching, hives, swelling of the face, lips, tongue, or throat Dry cough, shortness of breath or trouble breathing Infection--fever, chills, cough, sore throat, wounds that don't heal, pain or trouble when passing urine, general feeling of discomfort or being unwell Kidney injury--decrease in the amount of urine, swelling of the ankles, hands, or feet Liver injury--right upper belly pain, loss of appetite, nausea, light-colored stool, dark yellow or brown urine, yellowing skin or eyes, unusual weakness or fatigue Low red blood cell level--unusual weakness or fatigue, dizziness, headache, trouble breathing Pain, tingling, or numbness in the hands or feet, muscle weakness, change in vision, confusion or trouble speaking, loss of balance or coordination, trouble walking, seizures Redness, blistering, peeling, or loosening of the skin, including inside the mouth Stomach bleeding--bloody or black, tar-like stools, vomiting blood or brown material that looks like coffee grounds Stomach pain that is severe, does not away, or gets worse Unusual bruising or bleeding Side effects that usually do not require medical attention (report these to your care team if they continue or are bothersome): Diarrhea Dizziness Hair loss Nausea Pain, redness, or swelling with sores inside the mouth or throat Skin reactions on sun-exposed areas Vomiting This list may not describe all possible side effects. Call your doctor for medical advice about side effects. You  may report side effects to FDA at 1-800-FDA-1088. Where should I keep my medication? Keep out of the reach of children and pets. Store at room temperature between 20 and 25 degrees C (68 and 77 degrees F). Protect from light. Keep the container tightly closed. Get rid of any unused medication after the expiration date. To get rid of medications that are no longer needed or have expired: Take the medication to a  medication take-back program. Check with your pharmacy or law enforcement to find a location. If you cannot return the medication, ask your pharmacist or care team how to get rid of this medication safely. NOTE: This sheet is a summary. It may not cover all possible information. If you have questions about this medicine, talk to your doctor, pharmacist, or health care provider.  2024 Elsevier/Gold Standard (2023-01-03 00:00:00)  Start methotrexate 3 tablets by mouth once a week after labs are available. Folic acid 1 mg tablet, 1 tablet by mouth daily Prednisone 5 mg tablet, 2 tablets by mouth daily for 1 week, 1-1/2 tablet by mouth daily for 1 week, 1 tablet by mouth daily for 1 week, half tablet by mouth daily for 1 week Standing Labs We placed an order today for your standing lab work.   Please have your standing labs drawn in 2 weeks after starting methotrexate and then every 3 months  Please have your labs drawn 2 weeks prior to your appointment so that the provider can discuss your lab results at your appointment, if possible.  Please note that you may see your imaging and lab results in MyChart before we have reviewed them. We will contact you once all results are reviewed. Please allow our office up to 72 hours to thoroughly review all of the results before contacting the office for clarification of your results.  WALK-IN LAB HOURS  Monday through Thursday from 8:00 am -12:30 pm and 1:00 pm-5:00 pm and Friday from 8:00 am-12:00 pm.  Patients with office visits requiring labs will be seen before walk-in labs.  You may encounter longer than normal wait times. Please allow additional time. Wait times may be shorter on  Monday and Thursday afternoons.  We do not book appointments for walk-in labs. We appreciate your patience and understanding with our staff.   Labs are drawn by [facility]. Please bring your co-pay at the time of your lab draw.  You may receive a bill from [facility] for your lab  work.  Please note if you are on Hydroxychloroquine and and an order has been placed for a Hydroxychloroquine level,  you will need to have it drawn 4 hours or more after your last dose.  If you wish to have your labs drawn at another location, please call the office 24 hours in advance so we can fax the orders.  The office is located at [address], [place], [place] [address]   If you have any questions regarding directions or hours of operation,  please call [phone].   As a reminder, please drink plenty of water prior to coming for your lab work. Thanks!   Vaccines You are taking a medication(s) that can suppress your immune system.  The following immunizations are recommended: Flu annually Covid-19  RSV Td/Tdap (tetanus, diphtheria, pertussis) every 10 years Pneumonia (Prevnar 15 then Pneumovax 23 at least 1 year apart.  Alternatively, can take Prevnar 20 without needing additional dose) Shingrix: 2 doses from 4 weeks to 6 months apart  Please check with your  PCP to make sure you are up to date.   If you have signs or symptoms of an infection or start antibiotics: First, call your PCP for workup of your infection. Hold your medication through the infection, until you complete your antibiotics, and until symptoms resolve if you take the following: Injectable medication (Actemra, Benlysta, Cimzia, Cosentyx, Enbrel, Humira, Kevzara, Orencia, Remicade, Simponi, Stelara, Taltz, Tremfya) Methotrexate Leflunomide (Arava) Mycophenolate (Cellcept) Harriette Ohara, Olumiant, or Rinvoq

## 2023-11-08 NOTE — Progress Notes (Signed)
Hemoglobin is low and stable.  CMP is normal.  Please forward results to her PCP.

## 2023-11-10 LAB — CBC WITH DIFFERENTIAL/PLATELET
Absolute Lymphocytes: 726 {cells}/uL — ABNORMAL LOW (ref 850–3900)
Absolute Monocytes: 519 {cells}/uL (ref 200–950)
Basophils Absolute: 41 {cells}/uL (ref 0–200)
Basophils Relative: 0.7 %
Eosinophils Absolute: 59 {cells}/uL (ref 15–500)
Eosinophils Relative: 1 %
HCT: 34.5 % — ABNORMAL LOW (ref 35.0–45.0)
Hemoglobin: 11.3 g/dL — ABNORMAL LOW (ref 11.7–15.5)
MCH: 30.1 pg (ref 27.0–33.0)
MCHC: 32.8 g/dL (ref 32.0–36.0)
MCV: 91.8 fL (ref 80.0–100.0)
MPV: 10.7 fL (ref 7.5–12.5)
Monocytes Relative: 8.8 %
Neutro Abs: 4555 {cells}/uL (ref 1500–7800)
Neutrophils Relative %: 77.2 %
Platelets: 220 10*3/uL (ref 140–400)
RBC: 3.76 10*6/uL — ABNORMAL LOW (ref 3.80–5.10)
RDW: 12.9 % (ref 11.0–15.0)
Total Lymphocyte: 12.3 %
WBC: 5.9 10*3/uL (ref 3.8–10.8)

## 2023-11-10 LAB — HEPATITIS B CORE ANTIBODY, IGM: Hep B C IgM: NONREACTIVE

## 2023-11-10 LAB — COMPLETE METABOLIC PANEL WITH GFR
AG Ratio: 1.4 (calc) (ref 1.0–2.5)
ALT: 11 U/L (ref 6–29)
AST: 21 U/L (ref 10–35)
Albumin: 4.2 g/dL (ref 3.6–5.1)
Alkaline phosphatase (APISO): 67 U/L (ref 37–153)
BUN: 14 mg/dL (ref 7–25)
CO2: 24 mmol/L (ref 20–32)
Calcium: 10 mg/dL (ref 8.6–10.4)
Chloride: 102 mmol/L (ref 98–110)
Creat: 0.71 mg/dL (ref 0.60–0.95)
Globulin: 3 g/dL (ref 1.9–3.7)
Glucose, Bld: 92 mg/dL (ref 65–99)
Potassium: 4.5 mmol/L (ref 3.5–5.3)
Sodium: 137 mmol/L (ref 135–146)
Total Bilirubin: 0.5 mg/dL (ref 0.2–1.2)
Total Protein: 7.2 g/dL (ref 6.1–8.1)
eGFR: 84 mL/min/{1.73_m2} (ref >=60)

## 2023-11-10 LAB — PROTEIN ELECTROPHORESIS, SERUM, WITH REFLEX
Albumin ELP: 4.4 g/dL (ref 3.8–4.8)
Alpha 1: 0.3 g/dL (ref 0.2–0.3)
Alpha 2: 0.8 g/dL (ref 0.5–0.9)
Beta 2: 0.3 g/dL (ref 0.2–0.5)
Beta Globulin: 0.5 g/dL (ref 0.4–0.6)
Gamma Globulin: 1.1 g/dL (ref 0.8–1.7)
Total Protein: 7.4 g/dL (ref 6.1–8.1)

## 2023-11-10 LAB — HEPATITIS B SURFACE ANTIGEN: Hepatitis B Surface Ag: NONREACTIVE

## 2023-11-10 LAB — HEPATITIS C ANTIBODY: Hepatitis C Ab: NONREACTIVE

## 2023-11-10 LAB — IGG, IGA, IGM
IgG (Immunoglobin G), Serum: 1219 mg/dL (ref 600–1540)
IgM, Serum: 167 mg/dL (ref 50–300)
Immunoglobulin A: 276 mg/dL (ref 70–320)

## 2023-11-10 LAB — QUANTIFERON-TB GOLD PLUS
Mitogen-NIL: >10 [IU]/mL
NIL: 0.01 [IU]/mL
QuantiFERON-TB Gold Plus: NEGATIVE
TB1-NIL: 0 [IU]/mL
TB2-NIL: 0 [IU]/mL

## 2023-11-10 MED ORDER — METHOTREXATE SODIUM 2.5 MG PO TABS: 7.5000 mg | ORAL_TABLET | ORAL | 0 refills | Status: DC

## 2023-11-10 MED ORDER — FOLIC ACID 1 MG PO TABS: 1.0000 mg | ORAL_TABLET | Freq: Every day | ORAL | 3 refills | Status: DC

## 2023-11-10 NOTE — Telephone Encounter (Signed)
My plan is to start her on methotrexate 3 tablets p.o. weekly along with folic acid 1 mg p.o. daily after the lab results are available.

## 2023-11-10 NOTE — Progress Notes (Signed)
Hepatitis B negative, hepatitis C negative, SPEP normal, immunoglobulins normal, TB negative.  It is okay to start methotrexate as discussed during the office visit.

## 2023-11-20 NOTE — Telephone Encounter (Signed)
  Per [employer]'s Co-pay portal, claim was accepted. They will load the amount of $ 474.68 to the debit card. Pt has to come to bring that card to the office for payment. Called pt to informed her of this update.

## 2023-11-20 NOTE — Telephone Encounter (Signed)
Per [employer]'s Co-[employer] portal, claim was accepted. They will load the amount of $ 474.68 to the debit card. Pt has to come to bring that card to the office for payment. Called pt to informed her of this update, but no answer, left her message to call us back.

## 2023-11-20 NOTE — Telephone Encounter (Signed)
Patient called back stating she was returning Hasna's call. Please call pt back.

## 2023-11-21 ENCOUNTER — Telehealth: Payer: Self-pay | Admitting: Family Medicine

## 2023-11-21 NOTE — Telephone Encounter (Signed)
Tammy with [facility]illing called re: Assistance/Discount Program for Prolia for this Patient. Advised will call back once we have more info.

## 2023-11-28 NOTE — Telephone Encounter (Signed)
  Per [employer]'s Co-pay portal, claim was accepted. They will load the amount of $ 474.68 to the debit card. Pt has to come to bring that card to the office for payment. Called pt to informed her of this update.

## 2023-12-03 ENCOUNTER — Other Ambulatory Visit: Payer: Self-pay | Admitting: Psychiatry

## 2023-12-03 DIAGNOSIS — F3181 Bipolar II disorder: Secondary | ICD-10-CM

## 2023-12-26 NOTE — Progress Notes (Signed)
 Office Visit Note  Patient: Christine Scott             Date of Birth: 1941/10/13           MRN: [id number]             PCP: Wendolyn Jenkins Jansky, MD Referring: Wendolyn Jenkins Jansky, MD Visit Date: 01/09/2024 Occupation: @GUAROCC @  Subjective:  Pain shoulders and right foot  History of Present Illness: Christine Scott is a 82 y.o. female with polymyalgia rheumatica, inflammatory arthritis and Sjogren's.  She returns today after her last visit in November 2024.  In September she developed pain and swelling in her hands at the time she was given a prednisone  taper and was started on hydroxychloroquine  as she did not respond to hydroxychloroquine .  She returned in November with severe pain and swelling in multiple joints.  At the time she was given on the prednisone  taper and methotrexate  3 tablets p.o. weekly.  Patient states that she took methotrexate  for 1 month and then she discontinued.  She states since then she has had a strep infection which is resolved now.  She recently started experiencing increased pain and discomfort. She complains of pain and discomfort in her neck, shoulders and her right foot.  None of the other joints are painful.  She states the discomfort in these joints have been going on for the last 3 weeks.    Activities of Daily Living:  Patient reports morning stiffness for 0 minute.   Patient Reports nocturnal pain.  Difficulty dressing/grooming: Denies Difficulty climbing stairs: Denies Difficulty getting out of chair: Denies Difficulty using hands for taps, buttons, cutlery, and/or writing: Denies  Review of Systems  Constitutional:  Negative for fatigue.  HENT:  Positive for mouth dryness. Negative for mouth sores.   Eyes:  Negative for dryness.  Respiratory:  Negative for shortness of breath.   Cardiovascular:  Negative for chest pain and palpitations.  Gastrointestinal:  Negative for blood in stool, constipation and diarrhea.  Endocrine: Negative for increased  urination.  Genitourinary:  Negative for involuntary urination.  Musculoskeletal:  Positive for joint pain, gait problem, joint pain, myalgias, muscle weakness, muscle tenderness and myalgias. Negative for joint swelling and morning stiffness.  Skin:  Negative for color change, rash, hair loss and sensitivity to sunlight.  Allergic/Immunologic: Negative for susceptible to infections.  Neurological:  Positive for headaches. Negative for dizziness.  Hematological:  Negative for swollen glands.  Psychiatric/Behavioral:  Negative for depressed mood and sleep disturbance. The patient is not nervous/anxious.     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   Greater trochanteric bursitis of left hip 07/04/2019   DDD (degenerative disc disease), lumbar 02/25/2019   Bipolar II disorder (HCC) 12/07/2018   Essential hypertension 08/12/2017   Lumbar radiculopathy 06/15/2017   Sjogren's syndrome 12/25/2016   Polymyalgia rheumatica (HCC) 12/25/2016   Primary osteoarthritis of both hands 12/25/2016   Primary osteoarthritis of both feet 12/25/2016   Osteoporosis 12/25/2016    Past Medical History:  Diagnosis Date   Abnormal Pap smear of cervix    --hx abnormal paps in her 30s and per patient this was reason for her Hysterectomy   Anxiety    Bursitis 12/26/2020   Depression    Dry eye    Dyspnea on exertion 10/07/2020   Encounter for herpes zoster vaccination 10/18/2021   History of  depression 08/12/2017   HSV-1 infection    Hypertension    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 Trial of PT/injection. If doesn't do well good candidate for reverse shoulder arthoplasty. Continue with PT (12/19). No specific f/u was scheduled.    Osteoarthritis    hands   Osteoporosis    Polymyalgia rheumatica (HCC)     Family History   Problem Relation Age of Onset   Congestive Heart Failure Mother    Stroke Mother    Diabetes Father    Colon cancer Brother    Diabetes Brother    Diabetes Brother    Stroke Brother    Heart attack Brother    Colon cancer Daughter    BRCA 1/2 Neg Hx    Breast cancer Neg Hx    Past Surgical History:  Procedure Laterality Date   ABDOMINAL HYSTERECTOMY     BREAST SURGERY  2016   benign mass removed in [place], [place].   FACIAL COSMETIC SURGERY     REVERSE SHOULDER ARTHROPLASTY Left 04/16/2020   Procedure: REVERSE SHOULDER ARTHROPLASTY;  Surgeon: Melita Drivers, MD;  Location: [facility];  Service: Orthopedics;  Laterality: Left;    TOTAL VAGINAL HYSTERECTOMY  age 40   --due to abnormal pap smears per patient   Social History   Social History Narrative   Right handed   Lives alone   Immunization History  Administered Date(s) Administered   Fluad  Quad(high Dose 65+) 08/28/2019, 11/24/2020, 09/09/2021   Fluad  Trivalent(High Dose 65+) 09/22/2023   Influenza, High Dose Seasonal PF 10/21/2017, 11/27/2018, 09/15/2022   Influenza-Unspecified 10/13/2016   PFIZER Comirnaty (Gray Top)Covid-19 Tri-Sucrose Vaccine 05/31/2021   PFIZER(Purple Top)SARS-COV-2 Vaccination 01/12/2020, 02/05/2020, 11/07/2020   Pfizer Covid-19 Vaccine Bivalent Booster 34yrs & up 11/24/2021   Pfizer(Comirnaty )Fall Seasonal Vaccine 12 years and older 10/07/2022, 09/22/2023   Pneumococcal Conjugate-13 08/17/2018   Pneumococcal Polysaccharide-23 01/19/2016   Tdap 12/26/2009, 10/18/2021   Zoster Recombinant(Shingrix ) 10/18/2021, 09/09/2022     Objective: Vital Signs: BP 123/73 (BP Location: Left Arm, Patient Position: Sitting, Cuff Size: Normal)   Pulse 77   Resp 12   Ht 5' (1.524 m)   Wt 124 lb (56.2 kg)   LMP  (LMP Unknown)   BMI 24.22 kg/m    Physical Exam Vitals and nursing note reviewed.  Constitutional:      Appearance: She is well-developed.  HENT:     Head: Normocephalic and atraumatic.   Eyes:     Conjunctiva/sclera: Conjunctivae normal.  Cardiovascular:     Rate and Rhythm: Normal rate and regular rhythm.     Heart sounds: Normal heart sounds.  Pulmonary:     Effort: Pulmonary effort is normal.     Breath sounds: Normal breath sounds.  Abdominal:     General: Bowel sounds are normal.     Palpations: Abdomen is soft.  Musculoskeletal:     Cervical back: Normal range of motion.  Lymphadenopathy:     Cervical: No cervical adenopathy.  Skin:    General: Skin is warm and dry.     Capillary Refill: Capillary refill takes less than 2 seconds.  Neurological:     Mental Status: She is alert and oriented to person, place, and time.  Psychiatric:        Behavior: Behavior normal.      Musculoskeletal Exam: Patient has stiffness with range of motion of the cervical spine.  She is some discomfort range of motion of her shoulders.  Elbow joints, wrist joints, MCPs PIPs and DIPs in good range of motion.  She had thickening of bilateral second and third MCP joints but no synovitis.  Hip joints and knee joints in good range of motion without any warmth swelling or effusion.  She has some tenderness over the lateral aspect of her right foot with no synovitis.  CDAI Exam: CDAI Score: 8  Patient Global: 20 / 100; Provider Global: 20 / 100 Swollen: 0 ; Tender: 5  Joint Exam 01/09/2024      Right  Left  Glenohumeral   Tender   Tender  MCP 2   Tender     MCP 3   Tender     MTP 5   Tender        Investigation: No additional findings.  Imaging: No results found.  Recent Labs: Lab Results  Component Value Date   WBC 5.9 11/07/2023   HGB 11.3 (L) 11/07/2023   PLT 220 11/07/2023   NA 137 11/07/2023   K 4.5 11/07/2023   CL 102 11/07/2023   CO2 24 11/07/2023   GLUCOSE 92 11/07/2023   BUN 14 11/07/2023   CREATININE 0.71 11/07/2023   BILITOT 0.5 11/07/2023   ALKPHOS 58 04/15/2021   AST 21 11/07/2023   ALT 11 11/07/2023   PROT 7.2 11/07/2023   PROT 7.4 11/07/2023    ALBUMIN 4.1 04/15/2021   CALCIUM  10.0 11/07/2023   GFRAA >60 04/13/2020   QFTBGOLDPLUS NEGATIVE 11/07/2023    Speciality Comments: PLQ Eye Exam: 10/06/2023 WNL @ [facility]   Osteoporosis managed by PCP.  Procedures:  No procedures performed Allergies: Patient has no known allergies.   Assessment / Plan:     Visit Diagnoses: Polymyalgia rheumatica (HCC) -she had no muscular weakness or tenderness.  Her disease is in remission.  Inflammatory arthritis - Started on prednisone  and hydroxychloroquine  September 2024 due to severe inflammatory arthritis.  She had an inadequate response to hydroxychloroquine .  Methotrexate  was added in November due to severe nature of inflammatory arthritis.  Patient took methotrexate  3 tablets p.o. weekly for 1 month and then discontinued.  Patient has been off methotrexate  for almost a month now.  Patient states she has been having increased discomfort in her shoulders, neck, hands and her right foot.  No synovitis was noted.  She has some tender joints as described above.  Advised to resume methotrexate .  I will also obtain labs today to monitor for drug toxicity.- Plan: Sedimentation rate  Sjogren's syndrome, with unspecified organ involvement (HCC) - Positive ANA, positive SSA, hypocomplementemia, sicca symptoms.  Over-the-counter products were discussed.  High risk medication use - Methotrexate  3 tablets p.o. weekly, folic acid  1 mg p.o. daily, (hydroxychloroquine  200 mg p.o. twice daily Monday to Friday-patient discontinued November) Plaquenil  eye exam 10/06/2023 - Plan: CBC with Differential/Platelet, COMPLETE METABOLIC PANEL WITH GFR today.  Patient was advised to get labs in 2 months and then every 3 months.  Information for immunization was placed in the AVS.  She was advised to hold methotrexate  if she develops an infection and resume after the infection resolves.  Status post reverse total shoulder replacement, left - Dr. Melita April  2021  Pain in both hands -she has been experiencing pain and stiffness in her bilateral hands.  Some synovial thickening was noted over MCP joints.  Rheumatoid arthritis and osteoarthritis overlap noted on the x-rays obtained in the past.  Trochanteric bursitis, left hip-resolved.  Primary osteoarthritis of both feet-planes of discomfort over the  lateral aspect of her right foot.  No synovitis was noted.  DDD (degenerative disc disease), cervical-she had good range of motion of the cervical spine.  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  Unstable gait  Osteoporosis - She is on Prolia  injections by her PCP.  Vitamin D  deficiency  History of hypertension  Memory loss  History of depression  Orders: Orders Placed This Encounter  Procedures   CBC with Differential/Platelet   COMPLETE METABOLIC PANEL WITH GFR   Sedimentation rate   No orders of the defined types were placed in this encounter.    Follow-Up Instructions: Return in about 2 months (around 03/08/2024) for PMR, inflammatory arthritis.   Maya Nash, MD  Note - This record has been created using Animal nutritionist.  Chart creation errors have been sought, but may not always  have been located. Such creation errors do not reflect on  the standard of medical care.

## 2024-01-01 ENCOUNTER — Encounter: Payer: Self-pay | Admitting: Family Medicine

## 2024-01-01 ENCOUNTER — Ambulatory Visit: Payer: Self-pay | Admitting: Family Medicine

## 2024-01-01 ENCOUNTER — Ambulatory Visit: Payer: Federal, State, Local not specified - PPO | Admitting: Family Medicine

## 2024-01-01 VITALS — BP 126/60 | HR 84 | Temp 98.3°F | Ht 60.0 in | Wt 126.4 lb

## 2024-01-01 DIAGNOSIS — J020 Streptococcal pharyngitis: Secondary | ICD-10-CM

## 2024-01-01 DIAGNOSIS — Z79899 Other long term (current) drug therapy: Secondary | ICD-10-CM

## 2024-01-01 DIAGNOSIS — M3501 Sicca syndrome with keratoconjunctivitis: Secondary | ICD-10-CM | POA: Diagnosis not present

## 2024-01-01 DIAGNOSIS — J029 Acute pharyngitis, unspecified: Secondary | ICD-10-CM

## 2024-01-01 LAB — POCT RAPID STREP A (OFFICE): Rapid Strep A Screen: POSITIVE — AB

## 2024-01-01 LAB — POC COVID19 BINAXNOW: SARS Coronavirus 2 Ag: NEGATIVE

## 2024-01-01 MED ORDER — PENICILLIN G BENZATHINE 1200000 UNIT/2ML IM SUSY: 1.2000 10*6.[IU] | PREFILLED_SYRINGE | Freq: Once | INTRAMUSCULAR | Status: DC

## 2024-01-01 MED ORDER — BENZONATATE 200 MG PO CAPS: 200.0000 mg | ORAL_CAPSULE | Freq: Two times a day (BID) | ORAL | 0 refills | Status: DC | PRN

## 2024-01-01 NOTE — Telephone Encounter (Signed)
 FYI

## 2024-01-01 NOTE — Telephone Encounter (Signed)
 FYI for team care and PCP

## 2024-01-01 NOTE — Telephone Encounter (Signed)
 Patient is scheduled with Dr. Mardelle Matte for 1/6 at 3pm.

## 2024-01-01 NOTE — Patient Instructions (Signed)
 Please follow up if symptoms do not improve or as needed.    VISIT SUMMARY:  You came in today because of a sore throat that started on Friday and has been getting worse. You also have a persistent cough that sometimes produces phlegm and causes headaches. You have been feeling generally unwell and have been using Tylenol  and gargling with salt water  and Listerine for relief.  YOUR PLAN:  -STREPTOCOCCAL PHARYNGITIS: Streptococcal pharyngitis is a bacterial infection that causes a sore throat. Your strep test came back positive, confirming this diagnosis. We discussed treatment options, and you chose to receive a shot for faster relief. You were given Bisulin LA 1.2 million units as an intramuscular injection. Additionally, Tessalon  Perles was prescribed to help with your tickly cough, and saltwater gargles were recommended to soothe your throat.  -COUGH: Your persistent cough is likely due to your dry and sore throat. We do not suspect a significant respiratory infection. For relief, you can use over-the-counter cough medicine like Delsym or Robitussin.  INSTRUCTIONS:  Please follow up if your symptoms do not improve or if they worsen. Continue using Tylenol  for pain relief as needed and follow the recommendations provided for your sore throat and cough.

## 2024-01-01 NOTE — Telephone Encounter (Signed)
  Chief Complaint: sore throat Symptoms: sore throat, productive cough, body aches, stuffy nose, red eyes Frequency: 4 days Pertinent Negatives: Patient denies SOB, inability to control secretions, fever Disposition: [] ED /[] Urgent Care (no appt availability in office) / [x] Appointment(In office/virtual)/ []  [place] [facility]/ [] Home Care/ [] Refused Recommended Disposition /[] [place] [facility]/ []  Follow-up with PCP Additional Notes: Pt called with symptoms of sore throat 8/10 pain, productive cough- no color, red eyes, hoarse voice, stuffy nose x4 days. Per protocol, pt to be evaluated within 24 hours. Pt scheduled in office with alternate provider for today at 1500. Care advice reviewed, denies further questions. Alerting PCP for review.,   Copied from CRM [id number]. Topic: Clinical - Red Word Triage >> Jan 01, 2024  8:03 AM Robinson H wrote: [facility] that prompted transfer to Nurse Triage: Patient has cough, stuffy nose, sore throat and body aches, states she's been having an off and off temperature 97, 98. Reason for Disposition  SEVERE (e.g., excruciating) throat pain  Answer Assessment - Initial Assessment Questions 1. ONSET: When did the throat start hurting? (Hours or days ago)      4 days 2. SEVERITY: How bad is the sore throat? (Scale 1-10; mild, moderate or severe)   - MILD (1-3):  Doesn't interfere with eating or normal activities.   - MODERATE (4-7): Interferes with eating some solids and normal activities.   - SEVERE (8-10):  Excruciating pain, interferes with most normal activities.   - SEVERE WITH DYSPHAGIA (10): Can't swallow liquids, drooling.     8/10 3. STREP EXPOSURE: Has there been any exposure to strep within the past week? If Yes, ask: What type of contact occurred?      Unknown, was on vacation out of town. 4.  VIRAL SYMPTOMS: Are there any symptoms of a cold, such as a runny nose, cough, hoarse voice or red eyes?      Stuffy nose, productive  cough, hoarse voice, red eyes 5. FEVER: Do you have a fever? If Yes, ask: What is your temperature, how was it measured, and when did it start?     Denies 6. PUS ON THE TONSILS: Is there pus on the tonsils in the back of your throat?     Denies 7. OTHER SYMPTOMS: Do you have any other symptoms? (e.g., difficulty breathing, headache, rash)     Denies  Protocols used: Sore Throat-A-AH

## 2024-01-01 NOTE — Progress Notes (Signed)
 Subjective  CC:  Chief Complaint  Patient presents with   Sore Throat    Pt stated that she has been having a sore throat since 12/29/2022 and it has gotten worse   Same day acute visit; PCP not available. New pt to me. Chart reviewed.   HPI: Christine Scott is a 83 y.o. female who presents to the office today to address the problems listed above in the chief complaint. Discussed the use of AI scribe software for clinical note transcription with the patient, who gave verbal consent to proceed.  History of Present Illness   83 year old female with history of Sjogren's, on methotrexate , history of PMR presents due to 3-day history of sore throat. The patient presents with a sore throat that started on Friday and has progressively worsened each day. She describes the pain as being all around, not just localized to the throat, with the mouth feeling very tender and sensitive. Certain foods and drinks, such as Pepsi and orange juice, cause a burning sensation.  She denies mouth sores.  Accompanying the sore throat is a persistent cough, which is mostly dry. The coughing also causes headaches. The patient denies any respiratory distress or runny nose. She reports feeling generally unwell and has been taking Tylenol  every six hours and gargling with salt water  and Listerine for relief. The patient has not been around anyone who is sick, except for a brief encounter with a child in an elevator about four to five days ago.she has had strep throat in the past. Feels similar. No gi sxs. No sob. Appetite is fair. Mild myalgias.  Recently stopped prednisone : had been chronically immunosuppressed.      Assessment  1. Strep pharyngitis   2. Sore throat   3. Sjogren's syndrome   4. High risk medication use      Plan  Assessment and Plan    Streptococcal Pharyngitis Acute onset of sore throat starting Friday, progressively worsening. Positive strep test confirms streptococcal pharyngitis. Symptoms  include sore throat, tender mouth, and cough. No runny nose, headache, body aches, breathing difficulties, nausea, vomiting, or diarrhea. Discussed treatment options including oral antibiotics and intramuscular injection. Patient prefers a shot for faster relief. Explained that the shot is effective for strep throat but oral antibiotics might be better if a different respiratory infection is suspected. Decided to proceed with the shot as the cough is likely due to dry throat. - Administer Bisulin LA 1.2 million units IM x1 - Prescribe Tessalon  Perles for tickly cough - Recommend saltwater gargles  Cough due to dry throat/sjogrens and strep Persistent cough likely secondary to dry and sore throat. No significant respiratory infection suspected. Cough is described as tickly and causing head pain when severe.  Start tessalon  perles.       Orders Placed This Encounter  Procedures   POCT rapid strep A   POC COVID-19   No orders of the defined types were placed in this encounter.    I reviewed the patients updated PMH, FH, and SocHx.    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   amLODipine  (NORVASC ) 10 MG tablet Take 1 tablet (10 mg total) by mouth daily.   B Complex-Biotin-FA (B-COMPLEX PO) Take 1 tablet by mouth daily. Super   Calcium  Carb-Cholecalciferol   (CALCIUM  CARBONATE-VITAMIN D3) 600-400 MG-UNIT TABS Take 1 tablet by mouth daily.    Cholecalciferol  (VITAMIN D3) 50 MCG (2000 UT) TABS Take 2,000 mg by mouth daily.   conjugated estrogens  (PREMARIN ) vaginal cream Place 1/2 gram per vagina at bedtime two to three times weekly.   cycloSPORINE  (RESTASIS ) 0.05 % ophthalmic emulsion SMARTSIG:In Eye(s)   denosumab  (PROLIA ) 60 MG/ML SOSY injection Inject 60 mg into the skin every 6 (six) months.   Emollient (AMLACTIN ULTRA SMOOTHING) 15 % CREA as directed Externally   folic acid  (FOLVITE ) 1 MG tablet Take 1 tablet (1 mg total) by mouth daily.   lamoTRIgine  (LAMICTAL ) 150 MG tablet Take 1 tablet (150 mg total) by mouth daily.   methotrexate  (RHEUMATREX) 2.5 MG tablet Take 3 tablets (7.5 mg total) by mouth once a week. Caution:Chemotherapy. Protect from light.   Multiple Vitamins-Minerals (MULTIVITAMIN PO) Take 1 tablet by mouth daily. Centrum silver   Omega-3 Fatty Acids (FISH OIL) 1000 MG CAPS Take 1,000 mg by mouth daily.    ondansetron  (ZOFRAN ) 4 MG tablet Take 4 mg by mouth every 8 (eight) hours as needed for nausea.   Polyethyl Glycol-Propyl Glycol (SYSTANE ULTRA OP) Place 1 drop into both eyes in the morning and at bedtime.    valACYclovir  (VALTREX ) 1000 MG tablet Take two tablets ( total 2000 mg) by mouth q12h x 1 day; Start: ASAP after symptom onset   White Petrolatum -Mineral Oil (SYSTANE NIGHTTIME) OINT Place 1 application into both eyes at bedtime as needed (Dry eye).     Allergies: Patient has no known allergies. Family History: Patient family history includes Colon cancer in her brother and daughter; Congestive Heart Failure in her mother; Diabetes in her brother, brother, and father; Heart attack in her brother; Stroke in her brother and mother. Social History:  Patient  reports that she has never smoked. She has never been exposed to tobacco smoke. She has never used smokeless tobacco. She reports current alcohol  use of about 2.0  standard drinks of alcohol  per week. She reports that she does not use drugs.  Review of Systems: Constitutional: Negative for fever malaise or anorexia Cardiovascular: negative for chest pain Respiratory: negative for SOB or persistent cough Gastrointestinal: negative for abdominal pain  Objective  Vitals: BP 126/60   Pulse 84   Temp 98.3 F (36.8 C)   Ht 5' (1.524 m)   Wt 126 lb 6.4 oz (57.3 kg)   LMP  (LMP Unknown)   SpO2 96%   BMI 24.69 kg/m  General: no acute distress , A&Ox3 HEENT: PEERL, conjunctiva normal, neck is supple, post pharynx mildly red. No exudate. No oral sores, no lad Cardiovascular:  RRR without murmur or gallop.  Respiratory:  Good breath sounds bilaterally, CTAB with normal respiratory effort Skin:  Warm, no rashes Office Visit on 01/01/2024  Component Date Value Ref Range Status   Rapid Strep A Screen 01/01/2024 Positive (A)  Negative Final   SARS Coronavirus 2 Ag 01/01/2024 Negative  Negative Final    Commons side effects, risks, benefits, and alternatives for medications and treatment plan prescribed today were discussed, and the patient  expressed understanding of the given instructions. Patient is instructed to call or message via MyChart if he/she has any questions or concerns regarding our treatment plan. No barriers to understanding were identified. We discussed Red Flag symptoms and signs in detail. Patient expressed understanding regarding what to do in case of urgent or emergency type symptoms.  Medication list was reconciled, printed and provided to the patient in AVS. Patient instructions and summary information was reviewed with the patient as documented in the AVS. This note was prepared with assistance of Dragon voice recognition software. Occasional wrong-word or sound-a-like substitutions may have occurred due to the inherent limitations of voice recognition software

## 2024-01-02 ENCOUNTER — Ambulatory Visit: Payer: Self-pay | Admitting: Family Medicine

## 2024-01-02 NOTE — Telephone Encounter (Signed)
  Chief Complaint: headache Symptoms: pounding over eye/temple Frequency: constant  Disposition: [] ED /[] Urgent Care (no appt availability in office) / [x] Appointment(In office/virtual)/ []  [place] [facility]/ [] Home Care/ [] Refused Recommended Disposition /[] [place] [facility]/ []  Follow-up with PCP Additional Notes: Pt was seen in office on 1/4 and had positive strep test. Pt was given shot and tessalon  200mg  was into pharmacy. Pt stated she woke up with pounding headache over eyes and temple and tylenol  isn't helping. Pain is 8/10. Pt is wondering if from IM shot  or tessalon  pearls. Pt is asking if she should stop them or if something else could be called in . RN advised pt on alternating pain medications and gave care advice on how help with headache.  Pt verbalized understanding. Appt made for 01/04/24 incase pt isn't feeling better.        Copied from CRM [id number]. Topic: Clinical - Medical Advice >> Jan 02, 2024  1:55 PM Thersia C wrote: Reason for CRM: Patient called stating she was diagnose with strep throat yesterday they prescribe her benzonatate  (TESSALON ) 200 MG capsule she has stated since taking that medication she has had a ongoing headache. She wants to know what should she do about that and is requesting a callback Reason for Disposition  [1] MILD-MODERATE headache AND [2] present > 72 hours  Answer Assessment - Initial Assessment Questions 1. LOCATION: Where does it hurt?      Over eyes/temple 2. ONSET: When did the headache start? (Minutes, hours or days)      yesterday 3. PATTERN: Does the pain come and go, or has it been constant since it started?     Constant/pounding with heart rate  4. SEVERITY: How bad is the pain? and What does it keep you from doing?  (e.g., Scale 1-10; mild, moderate, or severe)   - MILD (1-3): doesn't interfere with normal activities    - MODERATE (4-7): interferes with normal activities or awakens from sleep    - SEVERE  (8-10): excruciating pain, unable to do any normal activities        8 5. RECURRENT SYMPTOM: Have you ever had headaches before? If Yes, ask: When was the last time? and What happened that time?      no 6. CAUSE: What do you think is causing the headache?     Tessalon  or abx shot 7. MIGRAINE: Have you been diagnosed with migraine headaches? If Yes, ask: Is this headache similar?      no 8. HEAD INJURY: Has there been any recent injury to the head?      no 9. OTHER SYMPTOMS: Do you have any other symptoms? (fever, stiff neck, eye pain, sore throat, cold symptoms)     Sore throat, temp 96.8 at 2p, neck hurts  Protocols used: Headache-A-AH

## 2024-01-02 NOTE — Telephone Encounter (Signed)
 Patient notified of message below and verbalized understanding.

## 2024-01-03 ENCOUNTER — Ambulatory Visit: Payer: Federal, State, Local not specified - PPO | Admitting: Family Medicine

## 2024-01-03 ENCOUNTER — Ambulatory Visit: Payer: Self-pay | Admitting: Family Medicine

## 2024-01-03 ENCOUNTER — Encounter: Payer: Self-pay | Admitting: Family Medicine

## 2024-01-03 VITALS — BP 134/67 | HR 76 | Temp 97.5°F | Resp 18 | Ht 60.0 in | Wt 126.5 lb

## 2024-01-03 DIAGNOSIS — J020 Streptococcal pharyngitis: Secondary | ICD-10-CM

## 2024-01-03 DIAGNOSIS — J069 Acute upper respiratory infection, unspecified: Secondary | ICD-10-CM

## 2024-01-03 MED ORDER — CEPHALEXIN 500 MG PO CAPS: 500.0000 mg | ORAL_CAPSULE | Freq: Two times a day (BID) | ORAL | 0 refills | Status: AC

## 2024-01-03 NOTE — Patient Instructions (Signed)
 It was very nice to see you today!  Take the new meds.  A lot of fluids.   Worse ER Let me know if not better next week   PLEASE NOTE:  If you had any lab tests please let us  know if you have not heard back within a few days. You may see your results on MyChart before we have a chance to review them but we will give you a call once they are reviewed by us . If we ordered any referrals today, please let us  know if you have not heard from their office within the next week.   Please try these tips to maintain a healthy lifestyle:  Eat most of your calories during the day when you are active. Eliminate processed foods including packaged sweets (pies, cakes, cookies), reduce intake of potatoes, white bread, white pasta, and white rice. Look for whole grain options, oat flour or almond flour.  Each meal should contain half fruits/vegetables, one quarter protein, and one quarter carbs (no bigger than a computer mouse).  Cut down on sweet beverages. This includes juice, soda, and sweet tea. Also watch fruit intake, though this is a healthier sweet option, it still contains natural sugar! Limit to 3 servings daily.  Drink at least 1 glass of water  with each meal and aim for at least 8 glasses per day  Exercise at least 150 minutes every week.

## 2024-01-03 NOTE — Telephone Encounter (Signed)
 Patient scheduled for this afternoon, will discuss then.

## 2024-01-03 NOTE — Telephone Encounter (Signed)
 Copied from CRM [id number]. Topic: Clinical - Medical Advice >> Jan 03, 2024  9:18 AM Macario HERO wrote: Reason for CRM: Patient said she has a sore throat and feels worse today. Is asking for advice on what she can take to soothe her throat.   Chief Complaint: Sore throat  Additional Notes:   Patient stated that she has a sore throat that seems to be getting worse. It is difficult for her to swallow. She has been recently treated for strep throat. She stated she got a Penicillin  shot. She also has a prescription for Tessalon . Patient reported that she stopped taking it due to headaches. Patient has been taking Tylenol , but she doesn't think that it is helping with the sore throat. Patient seeking further advice. Patient already has an appointment scheduled for this afternoon.   This RN recommended saltwater gargle, Ibuprofen, cough drops/ throat lozenges, warm tea with honey and instructed patient to keep her appointment today for further instruction.  Reason for Disposition  Health Information question, no triage required and triager able to answer question  Answer Assessment - Initial Assessment Questions 1. REASON FOR CALL or QUESTION: What is your reason for calling today? or How can I best help you? or What question do you have that I can help answer?     Requesting advice for sore throat  Protocols used: Information Only Call - No Triage-A-AH

## 2024-01-03 NOTE — Progress Notes (Signed)
 Subjective:     Patient ID: Christine Scott, female    DOB: 1941-06-11, 83 y.o.   MRN: [id number]  Chief Complaint  Patient presents with   Sore Throat    Sore throat has gotten worse, unable to swallow Dx with strep on Monday     HPI Sore throat getting worse.  Hard to swallow. As sore.    Dx strep 2 days ago and given IM bicillin .  Also uri and got HA w/tessalon  perles  no f/c    There are no preventive care reminders to display for this patient.  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 Trial of PT/injection. If doesn't do well good candidate for reverse shoulder arthoplasty. Continue with PT (12/19). No specific f/u was scheduled.    Osteoarthritis    hands   Osteoporosis    Polymyalgia rheumatica (HCC)     Past Surgical History:  Procedure Laterality Date   ABDOMINAL HYSTERECTOMY     BREAST SURGERY  2016   benign mass removed in [place], [place].   FACIAL COSMETIC SURGERY     REVERSE SHOULDER ARTHROPLASTY Left 04/16/2020   Procedure: REVERSE SHOULDER ARTHROPLASTY;  Surgeon: Melita Drivers, MD;  Location: [facility];  Service: Orthopedics;  Laterality: Left;    TOTAL VAGINAL HYSTERECTOMY  age 55   --due to abnormal pap smears per patient     Current Outpatient Medications:    acetaminophen  (TYLENOL ) 500 MG tablet, Take 1,000 mg by mouth at bedtime., Disp: , Rfl:    amLODipine  (NORVASC ) 10 MG tablet, Take 1 tablet (10 mg total) by mouth daily., Disp: 90 tablet, Rfl: 1   B Complex-Biotin-FA (B-COMPLEX PO), Take 1 tablet by mouth daily. Super, Disp:  , Rfl:    Calcium  Carb-Cholecalciferol  (CALCIUM  CARBONATE-VITAMIN D3) 600-400 MG-UNIT TABS, Take 1 tablet by mouth daily. , Disp: , Rfl:    cephALEXin  (KEFLEX ) 500 MG capsule, Take 1 capsule (500 mg total) by mouth 2 (two) times daily for 7 days. Take for 7 days, Disp: 14 capsule, Rfl: 0   Cholecalciferol  (VITAMIN D3) 50 MCG (2000 UT) TABS, Take 2,000 mg by mouth daily., Disp: , Rfl:    conjugated estrogens  (PREMARIN ) vaginal cream, Place 1/2 gram per vagina at bedtime two to three times weekly., Disp: 30 g, Rfl: 1   cycloSPORINE  (RESTASIS ) 0.05 % ophthalmic emulsion, SMARTSIG:In Eye(s), Disp: , Rfl:    denosumab  (PROLIA ) 60 MG/ML SOSY injection, Inject 60 mg into the skin every 6 (six) months., Disp: , Rfl:    Emollient (AMLACTIN ULTRA SMOOTHING) 15 % CREA, as directed Externally, Disp: , Rfl:    folic acid  (FOLVITE ) 1 MG tablet, Take 1 tablet (1 mg total) by mouth daily., Disp: 90 tablet, Rfl: 3   lamoTRIgine  (LAMICTAL ) 150 MG tablet, Take 1 tablet (150 mg total) by mouth daily., Disp: 90 tablet, Rfl: 1   methotrexate  (RHEUMATREX) 2.5 MG tablet, Take 3 tablets (7.5 mg total) by mouth once a week. Caution:Chemotherapy. Protect from light., Disp:  36 tablet, Rfl: 0   Multiple Vitamins-Minerals (MULTIVITAMIN PO), Take 1 tablet by mouth daily. Centrum silver, Disp: , Rfl:    Omega-3 Fatty Acids (FISH OIL) 1000 MG CAPS, Take 1,000 mg by mouth daily. , Disp: , Rfl:    ondansetron  (ZOFRAN ) 4 MG tablet, Take 4 mg by mouth every 8 (eight) hours as needed for nausea., Disp: , Rfl:    Polyethyl Glycol-Propyl Glycol (SYSTANE ULTRA OP), Place 1 drop into both eyes in the morning and at bedtime. , Disp: , Rfl:    valACYclovir  (VALTREX ) 1000 MG tablet, Take two tablets ( total 2000 mg) by mouth q12h x 1 day; Start: ASAP after symptom onset, Disp: 30 tablet, Rfl: 1   White Petrolatum -Mineral Oil (SYSTANE NIGHTTIME) OINT, Place 1 application into both eyes at bedtime as needed (Dry eye). , Disp: , Rfl:    losartan   (COZAAR ) 25 MG tablet, Take 1 tablet (25 mg total) by mouth daily., Disp: 90 tablet, Rfl: 3  No Known Allergies ROS neg/noncontributory except as noted HPI/below      Objective:     BP 134/67   Pulse 76   Temp (!) 97.5 F (36.4 C) (Temporal)   Resp 18   Ht 5' (1.524 m)   Wt 126 lb 8 oz (57.4 kg)   LMP  (LMP Unknown)   SpO2 98%   BMI 24.71 kg/m  Wt Readings from Last 3 Encounters:  01/03/24 126 lb 8 oz (57.4 kg)  01/01/24 126 lb 6.4 oz (57.3 kg)  11/07/23 125 lb 9.6 oz (57 kg)    Physical Exam   Gen: WDWN NAD HEENT: NCAT, conjunctiva not injected, sclera nonicteric TM WNL B, OP moist, no exudates  but still really red NECK:  supple, no thyromegaly, + submand nodes CARDIAC: RRR, S1S2+, no murmur.  LUNGS: CTAB. No wheezes ABDOMEN:  BS+, soft, NTND, No HSM, no masses EXT:  no edema MSK: no gross abnormalities.  NEURO: A&O x3.  CN II-XII intact.  PSYCH: normal mood. Good eye contact     Assessment & Plan:  Strep pharyngitis  Upper respiratory tract infection, unspecified type  Other orders -     Cephalexin ; Take 1 capsule (500 mg total) by mouth 2 (two) times daily for 7 days. Take for 7 days  Dispense: 14 capsule; Refill: 0  Strep-s/p bicillin  IM  pt getting worse  will do keflex  500 bid URI-resolving  symptomatic tx  Return if symptoms worsen or fail to improve.  Jenkins CHRISTELLA Carrel, MD

## 2024-01-04 ENCOUNTER — Ambulatory Visit: Payer: Federal, State, Local not specified - PPO | Admitting: Family Medicine

## 2024-01-09 ENCOUNTER — Ambulatory Visit: Payer: Federal, State, Local not specified - PPO | Attending: Rheumatology | Admitting: Rheumatology

## 2024-01-09 ENCOUNTER — Encounter: Payer: Self-pay | Admitting: Rheumatology

## 2024-01-09 ENCOUNTER — Other Ambulatory Visit: Payer: Self-pay | Admitting: Psychiatry

## 2024-01-09 VITALS — BP 123/73 | HR 77 | Resp 12 | Ht 60.0 in | Wt 124.0 lb

## 2024-01-09 DIAGNOSIS — M19072 Primary osteoarthritis, left ankle and foot: Secondary | ICD-10-CM

## 2024-01-09 DIAGNOSIS — E559 Vitamin D deficiency, unspecified: Secondary | ICD-10-CM

## 2024-01-09 DIAGNOSIS — M3500 Sicca syndrome, unspecified: Secondary | ICD-10-CM

## 2024-01-09 DIAGNOSIS — M353 Polymyalgia rheumatica: Secondary | ICD-10-CM | POA: Diagnosis not present

## 2024-01-09 DIAGNOSIS — M5030 Other cervical disc degeneration, unspecified cervical region: Secondary | ICD-10-CM

## 2024-01-09 DIAGNOSIS — R2681 Unsteadiness on feet: Secondary | ICD-10-CM

## 2024-01-09 DIAGNOSIS — Z96612 Presence of left artificial shoulder joint: Secondary | ICD-10-CM

## 2024-01-09 DIAGNOSIS — F3181 Bipolar II disorder: Secondary | ICD-10-CM

## 2024-01-09 DIAGNOSIS — M19071 Primary osteoarthritis, right ankle and foot: Secondary | ICD-10-CM

## 2024-01-09 DIAGNOSIS — M79642 Pain in left hand: Secondary | ICD-10-CM

## 2024-01-09 DIAGNOSIS — Z79899 Other long term (current) drug therapy: Secondary | ICD-10-CM

## 2024-01-09 DIAGNOSIS — R413 Other amnesia: Secondary | ICD-10-CM

## 2024-01-09 DIAGNOSIS — M7062 Trochanteric bursitis, left hip: Secondary | ICD-10-CM

## 2024-01-09 DIAGNOSIS — M47816 Spondylosis without myelopathy or radiculopathy, lumbar region: Secondary | ICD-10-CM

## 2024-01-09 DIAGNOSIS — M1990 Unspecified osteoarthritis, unspecified site: Secondary | ICD-10-CM

## 2024-01-09 DIAGNOSIS — M810 Age-related osteoporosis without current pathological fracture: Secondary | ICD-10-CM

## 2024-01-09 DIAGNOSIS — Z8679 Personal history of other diseases of the circulatory system: Secondary | ICD-10-CM

## 2024-01-09 DIAGNOSIS — Z8659 Personal history of other mental and behavioral disorders: Secondary | ICD-10-CM

## 2024-01-09 DIAGNOSIS — M79641 Pain in right hand: Secondary | ICD-10-CM

## 2024-01-09 NOTE — Patient Instructions (Signed)
 Continue methotrexate  2.5 mg tablet, 3 tablets only once a week Continue folic acid  1 mg tablet, 1 tablet daily  Standing Labs We placed an order today for your standing lab work.   Please have your standing labs drawn in 2 months and then every 3 months  Please have your labs drawn 2 weeks prior to your appointment so that the provider can discuss your lab results at your appointment, if possible.  Please note that you may see your imaging and lab results in MyChart before we have reviewed them. We will contact you once all results are reviewed. Please allow our office up to 72 hours to thoroughly review all of the results before contacting the office for clarification of your results.  WALK-IN LAB HOURS  Monday through Thursday from 8:00 am -12:30 pm and 1:00 pm-5:00 pm and Friday from 8:00 am-12:00 pm.  Patients with office visits requiring labs will be seen before walk-in labs.  You may encounter longer than normal wait times. Please allow additional time. Wait times may be shorter on  Monday and Thursday afternoons.  We do not book appointments for walk-in labs. We appreciate your patience and understanding with our staff.   Labs are drawn by [facility]. Please bring your co-pay at the time of your lab draw.  You may receive a bill from [facility] for your lab work.  Please note if you are on Hydroxychloroquine  and and an order has been placed for a Hydroxychloroquine  level,  you will need to have it drawn 4 hours or more after your last dose.  If you wish to have your labs drawn at another location, please call the office 24 hours in advance so we can fax the orders.  The office is located at [address], [place], [place] [address]   If you have any questions regarding directions or hours of operation,  please call [phone].   As a reminder, please drink plenty of water  prior to coming for your lab work. Thanks!   Vaccines You are taking a medication(s) that can  suppress your immune system.  The following immunizations are recommended: Flu annually Covid-19 RSV  Td/Tdap (tetanus, diphtheria, pertussis) every 10 years Pneumonia (Prevnar 15 then Pneumovax 23 at least 1 year apart.  Alternatively, can take Prevnar 20 without needing additional dose) Shingrix : 2 doses from 4 weeks to 6 months apart  Please check with your PCP to make sure you are up to date.   If you have signs or symptoms of an infection or start antibiotics: First, call your PCP for workup of your infection. Hold your medication through the infection, until you complete your antibiotics, and until symptoms resolve if you take the following: Injectable medication (Actemra, Benlysta, Cimzia, Cosentyx, Enbrel, Humira, Kevzara, Orencia, Remicade, Simponi, Stelara, Taltz, Tremfya) Methotrexate  Leflunomide (Arava) Mycophenolate (Cellcept) Xeljanz, Olumiant, or Rinvoq

## 2024-01-10 ENCOUNTER — Telehealth: Payer: Self-pay | Admitting: Internal Medicine

## 2024-01-10 ENCOUNTER — Other Ambulatory Visit: Payer: Self-pay | Admitting: *Deleted

## 2024-01-10 ENCOUNTER — Other Ambulatory Visit: Payer: Self-pay | Admitting: Rheumatology

## 2024-01-10 DIAGNOSIS — Z79899 Other long term (current) drug therapy: Secondary | ICD-10-CM

## 2024-01-10 DIAGNOSIS — M3501 Sicca syndrome with keratoconjunctivitis: Secondary | ICD-10-CM

## 2024-01-10 DIAGNOSIS — M353 Polymyalgia rheumatica: Secondary | ICD-10-CM

## 2024-01-10 DIAGNOSIS — D708 Other neutropenia: Secondary | ICD-10-CM

## 2024-01-10 DIAGNOSIS — M3500 Sicca syndrome, unspecified: Secondary | ICD-10-CM

## 2024-01-10 DIAGNOSIS — M1990 Unspecified osteoarthritis, unspecified site: Secondary | ICD-10-CM

## 2024-01-10 LAB — CBC WITH DIFFERENTIAL/PLATELET
Absolute Lymphocytes: 570 {cells}/uL — ABNORMAL LOW (ref 850–3900)
Absolute Lymphocytes: 850 {cells}/uL (ref 850–3900)
Absolute Monocytes: 682 {cells}/uL (ref 200–950)
Absolute Monocytes: 751 {cells}/uL (ref 200–950)
Basophils Absolute: 20 {cells}/uL (ref 0–200)
Basophils Absolute: 19 {cells}/uL (ref 0–200)
Basophils Relative: 1.2 %
Basophils Relative: 0.8 %
Eosinophils Absolute: 61 {cells}/uL (ref 15–500)
Eosinophils Absolute: 79 {cells}/uL (ref 15–500)
Eosinophils Relative: 3.6 %
Eosinophils Relative: 3.3 %
HCT: 37.4 % (ref 35.0–45.0)
HCT: 35.7 % (ref 35.0–45.0)
Hemoglobin: 12.6 g/dL (ref 11.7–15.5)
Hemoglobin: 11.8 g/dL (ref 11.7–15.5)
MCH: 30 pg (ref 27.0–33.0)
MCH: 30.1 pg (ref 27.0–33.0)
MCHC: 33.7 g/dL (ref 32.0–36.0)
MCHC: 33.1 g/dL (ref 32.0–36.0)
MCV: 89 fL (ref 80.0–100.0)
MCV: 91.1 fL (ref 80.0–100.0)
MPV: 12.6 fL — ABNORMAL HIGH (ref 7.5–12.5)
MPV: 10.9 fL (ref 7.5–12.5)
Monocytes Relative: 40.1 %
Monocytes Relative: 31.3 %
Neutro Abs: 367 {cells}/uL — CL (ref 1500–7800)
Neutro Abs: 701 {cells}/uL — ABNORMAL LOW (ref 1500–7800)
Neutrophils Relative %: 21.6 %
Neutrophils Relative %: 29.2 %
Platelets: 71 10*3/uL — ABNORMAL LOW (ref 140–400)
Platelets: 71 10*3/uL — ABNORMAL LOW (ref 140–400)
RBC: 4.2 10*6/uL (ref 3.80–5.10)
RBC: 3.92 10*6/uL (ref 3.80–5.10)
RDW: 13.3 % (ref 11.0–15.0)
RDW: 13.6 % (ref 11.0–15.0)
Total Lymphocyte: 33.5 %
Total Lymphocyte: 35.4 %
WBC: 1.7 10*3/uL — ABNORMAL LOW (ref 3.8–10.8)
WBC: 2.4 10*3/uL — ABNORMAL LOW (ref 3.8–10.8)

## 2024-01-10 LAB — COMPLETE METABOLIC PANEL WITH GFR
AG Ratio: 1.2 (calc) (ref 1.0–2.5)
ALT: 44 U/L — ABNORMAL HIGH (ref 6–29)
AST: 53 U/L — ABNORMAL HIGH (ref 10–35)
Albumin: 3.9 g/dL (ref 3.6–5.1)
Alkaline phosphatase (APISO): 79 U/L (ref 37–153)
BUN: 17 mg/dL (ref 7–25)
CO2: 28 mmol/L (ref 20–32)
Calcium: 9.8 mg/dL (ref 8.6–10.4)
Chloride: 101 mmol/L (ref 98–110)
Creat: 0.71 mg/dL (ref 0.60–0.95)
Globulin: 3.3 g/dL (ref 1.9–3.7)
Glucose, Bld: 97 mg/dL (ref 65–99)
Potassium: 3.6 mmol/L (ref 3.5–5.3)
Sodium: 135 mmol/L (ref 135–146)
Total Bilirubin: 0.3 mg/dL (ref 0.2–1.2)
Total Protein: 7.2 g/dL (ref 6.1–8.1)
eGFR: 84 mL/min/{1.73_m2} (ref >=60)

## 2024-01-10 LAB — SEDIMENTATION RATE: Sed Rate: 55 mm/h — ABNORMAL HIGH (ref 0–30)

## 2024-01-10 NOTE — Telephone Encounter (Signed)
Pt is scheduled for 3/25

## 2024-01-10 NOTE — Telephone Encounter (Signed)
 Received call from [facility] regarding critical abnormal labs. Absolute neutrophil count is low at 367. Of note on lab review WBC is low at 1.7, lymphocytes low at 570, and platelets low at 71. Recent treatment of strep pharyngitis but had reported clinical improvement at office note yesterday but also with active symptoms attributed to PMR activity. Looks like recent addition of methotrexate  but had already discontinued medication. Plan to reach out later this morning and discuss.

## 2024-01-10 NOTE — Telephone Encounter (Signed)
 LMOM for patient to bring bottle of methotrexate  when she comes for lab draw today.

## 2024-01-10 NOTE — Telephone Encounter (Signed)
 I called patient this morning.  She states she has been off hydroxychloroquine  and methotrexate .  She had been taking antibiotics.  She denies any infection or fever today.  She had recent infection for which she was given antibiotics.  I advised her to come in today to have repeat CBC with differential.  I advised patient not to start methotrexate .  Please call the pharmacy and cancel the prescription for methotrexate  .  If her white cell count stays low we will refer her to hematology.

## 2024-01-10 NOTE — Telephone Encounter (Signed)
 Please schedule pt an appt. LV 1/25 DUE BACK IN 2 MONTHS.

## 2024-01-10 NOTE — Telephone Encounter (Signed)
 I called [facility], patient picked up methotrexate  on 11/12/2023.

## 2024-01-10 NOTE — Progress Notes (Signed)
 Please have patient repeat CBC with differential on Monday.  Patient should not take methotrexate  or hydroxychloroquine .  Patient should avoid all NSAIDs.  I will forward results to Dr. Waldo Guitar.  Most likely cause of low white cell count and platelets could be antibiotics or methotrexate  or recent infection.  Patient had been off methotrexate  for more than a month.

## 2024-01-10 NOTE — Telephone Encounter (Signed)
 Patient brought remaining methotrexate  to office, there were 24 pills left in the bottle out of 36, remaining methotrexate  discarded as medication was discontinued by Dr. Alvira Josephs.

## 2024-01-11 ENCOUNTER — Telehealth: Payer: Self-pay | Admitting: *Deleted

## 2024-01-11 ENCOUNTER — Encounter: Payer: Self-pay | Admitting: *Deleted

## 2024-01-11 NOTE — Telephone Encounter (Signed)
Patient advised she can try using a heating pad.  Dr. Corliss Skains would avoid muscle relaxers due to increased risk of dizziness and falling.  Sent handout on neck exercises via my chart.

## 2024-01-11 NOTE — Telephone Encounter (Signed)
Please advise using heating pad.  I would avoid muscle relaxers due to increased risk of dizziness and falling.  You may also send handout on neck exercises.

## 2024-01-11 NOTE — Telephone Encounter (Signed)
Patient contacted the office and states she is having pain in her neck. Patient describes it as a "crick" in her neck. Patient is concerned with it being a pinched nerve. Patient has tried Tylenol with no relief. Patient states it started yesterday afternoon. Patient states it interrupted her sleep last night. Please advise.

## 2024-01-12 ENCOUNTER — Telehealth: Payer: Self-pay | Admitting: Rheumatology

## 2024-01-12 ENCOUNTER — Ambulatory Visit: Payer: Self-pay | Admitting: Family Medicine

## 2024-01-12 ENCOUNTER — Inpatient Hospital Stay (HOSPITAL_BASED_OUTPATIENT_CLINIC_OR_DEPARTMENT_OTHER)
Admission: EM | Admit: 2024-01-12 | Discharge: 2024-01-16 | DRG: 552 | Disposition: A | Discharge status: Home / Self Care | Payer: Medicare Other | Attending: Family Medicine | Admitting: Family Medicine

## 2024-01-12 ENCOUNTER — Encounter (HOSPITAL_BASED_OUTPATIENT_CLINIC_OR_DEPARTMENT_OTHER): Payer: Self-pay

## 2024-01-12 ENCOUNTER — Ambulatory Visit
Admission: RE | Admit: 2024-01-12 | Discharge: 2024-01-12 | Disposition: A | Discharge status: Home / Self Care | Payer: Federal, State, Local not specified - PPO | Source: Ambulatory Visit | Attending: Family Medicine | Admitting: Family Medicine

## 2024-01-12 ENCOUNTER — Emergency Department (HOSPITAL_BASED_OUTPATIENT_CLINIC_OR_DEPARTMENT_OTHER): Payer: Medicare Other

## 2024-01-12 ENCOUNTER — Other Ambulatory Visit: Payer: Self-pay

## 2024-01-12 VITALS — BP 167/75 | HR 96 | Temp 98.6°F | Resp 18

## 2024-01-12 DIAGNOSIS — Z96612 Presence of left artificial shoulder joint: Secondary | ICD-10-CM | POA: Diagnosis present

## 2024-01-12 DIAGNOSIS — D61818 Other pancytopenia: Secondary | ICD-10-CM | POA: Diagnosis present

## 2024-01-12 DIAGNOSIS — M19042 Primary osteoarthritis, left hand: Secondary | ICD-10-CM | POA: Diagnosis present

## 2024-01-12 DIAGNOSIS — M353 Polymyalgia rheumatica: Secondary | ICD-10-CM | POA: Diagnosis present

## 2024-01-12 DIAGNOSIS — Z833 Family history of diabetes mellitus: Secondary | ICD-10-CM

## 2024-01-12 DIAGNOSIS — Z90710 Acquired absence of both cervix and uterus: Secondary | ICD-10-CM

## 2024-01-12 DIAGNOSIS — M4802 Spinal stenosis, cervical region: Principal | ICD-10-CM

## 2024-01-12 DIAGNOSIS — Z79899 Other long term (current) drug therapy: Secondary | ICD-10-CM

## 2024-01-12 DIAGNOSIS — Z823 Family history of stroke: Secondary | ICD-10-CM

## 2024-01-12 DIAGNOSIS — S161XXA Strain of muscle, fascia and tendon at neck level, initial encounter: Secondary | ICD-10-CM | POA: Diagnosis present

## 2024-01-12 DIAGNOSIS — Z8249 Family history of ischemic heart disease and other diseases of the circulatory system: Secondary | ICD-10-CM

## 2024-01-12 DIAGNOSIS — F32A Depression, unspecified: Secondary | ICD-10-CM | POA: Diagnosis present

## 2024-01-12 DIAGNOSIS — R202 Paresthesia of skin: Secondary | ICD-10-CM

## 2024-01-12 DIAGNOSIS — I6502 Occlusion and stenosis of left vertebral artery: Principal | ICD-10-CM

## 2024-01-12 DIAGNOSIS — Z800 Family history of malignant neoplasm of digestive organs: Secondary | ICD-10-CM

## 2024-01-12 DIAGNOSIS — E871 Hypo-osmolality and hyponatremia: Secondary | ICD-10-CM | POA: Diagnosis present

## 2024-01-12 DIAGNOSIS — M436 Torticollis: Secondary | ICD-10-CM

## 2024-01-12 DIAGNOSIS — F3181 Bipolar II disorder: Secondary | ICD-10-CM | POA: Diagnosis present

## 2024-01-12 DIAGNOSIS — M542 Cervicalgia: Secondary | ICD-10-CM

## 2024-01-12 DIAGNOSIS — I10 Essential (primary) hypertension: Secondary | ICD-10-CM | POA: Diagnosis present

## 2024-01-12 DIAGNOSIS — M19041 Primary osteoarthritis, right hand: Secondary | ICD-10-CM | POA: Diagnosis present

## 2024-01-12 LAB — CBC WITH DIFFERENTIAL/PLATELET
Abs Immature Granulocytes: 0.03 10*3/uL (ref 0.00–0.07)
Basophils Absolute: 0 10*3/uL (ref 0.0–0.1)
Basophils Relative: 0 %
Eosinophils Absolute: 0 10*3/uL (ref 0.0–0.5)
Eosinophils Relative: 1 %
HCT: 35.1 % — ABNORMAL LOW (ref 36.0–46.0)
Hemoglobin: 11.7 g/dL — ABNORMAL LOW (ref 12.0–15.0)
Immature Granulocytes: 1 %
Lymphocytes Relative: 11 %
Lymphs Abs: 0.6 10*3/uL — ABNORMAL LOW (ref 0.7–4.0)
MCH: 29.4 pg (ref 26.0–34.0)
MCHC: 33.3 g/dL (ref 30.0–36.0)
MCV: 88.2 fL (ref 80.0–100.0)
Monocytes Absolute: 0.7 10*3/uL (ref 0.1–1.0)
Monocytes Relative: 12 %
Neutro Abs: 4.4 10*3/uL (ref 1.7–7.7)
Neutrophils Relative %: 75 %
Platelets: 108 10*3/uL — ABNORMAL LOW (ref 150–400)
RBC: 3.98 MIL/uL (ref 3.87–5.11)
RDW: 14.4 % (ref 11.5–15.5)
WBC: 5.8 10*3/uL (ref 4.0–10.5)
nRBC: 0 % (ref 0.0–0.2)

## 2024-01-12 LAB — BASIC METABOLIC PANEL
Anion gap: 10 (ref 5–15)
BUN: 15 mg/dL (ref 8–23)
CO2: 26 mmol/L (ref 22–32)
Calcium: 9.2 mg/dL (ref 8.9–10.3)
Chloride: 98 mmol/L (ref 98–111)
Creatinine, Ser: 0.71 mg/dL (ref 0.44–1.00)
GFR, Estimated: >60 mL/min (ref >60)
Glucose, Bld: 106 mg/dL — ABNORMAL HIGH (ref 70–99)
Potassium: 4.1 mmol/L (ref 3.5–5.1)
Sodium: 134 mmol/L — ABNORMAL LOW (ref 135–145)

## 2024-01-12 MED ORDER — MORPHINE SULFATE (PF) 4 MG/ML IV SOLN
4.0000 mg | Freq: Once | INTRAVENOUS | Status: AC
  Administered 2024-01-12: 4 mg via INTRAVENOUS
  Filled 2024-01-12: qty 1

## 2024-01-12 MED ORDER — ACETAMINOPHEN 500 MG PO TABS
1000.0000 mg | ORAL_TABLET | Freq: Once | ORAL | Status: AC
  Administered 2024-01-12: 1000 mg via ORAL
  Filled 2024-01-12: qty 2

## 2024-01-12 MED ORDER — LORAZEPAM 1 MG PO TABS
1.0000 mg | ORAL_TABLET | Freq: Once | ORAL | Status: AC | PRN
  Administered 2024-01-12: 1 mg via ORAL
  Filled 2024-01-12: qty 1

## 2024-01-12 MED ORDER — LIDOCAINE 5 % EX PTCH
1.0000 | MEDICATED_PATCH | Freq: Once | CUTANEOUS | Status: AC
  Administered 2024-01-12: 1 via TRANSDERMAL
  Filled 2024-01-12: qty 1

## 2024-01-12 MED ORDER — IOHEXOL 350 MG/ML SOLN
75.0000 mL | Freq: Once | INTRAVENOUS | Status: AC | PRN
  Administered 2024-01-12: 75 mL via INTRAVENOUS

## 2024-01-12 MED ORDER — SODIUM CHLORIDE 0.9 % IV BOLUS
1000.0000 mL | Freq: Once | INTRAVENOUS | Status: AC
Start: 2024-01-12 — End: 2024-01-12
  Administered 2024-01-12: 1000 mL via INTRAVENOUS

## 2024-01-12 MED ORDER — OXYCODONE HCL 5 MG PO TABS
5.0000 mg | ORAL_TABLET | Freq: Once | ORAL | Status: AC
  Administered 2024-01-12: 5 mg via ORAL
  Filled 2024-01-12: qty 1

## 2024-01-12 MED ORDER — ONDANSETRON HCL 4 MG/2ML IJ SOLN
4.0000 mg | Freq: Once | INTRAMUSCULAR | Status: AC
  Administered 2024-01-12: 4 mg via INTRAVENOUS
  Filled 2024-01-12: qty 2

## 2024-01-12 MED ORDER — CYCLOBENZAPRINE HCL 5 MG PO TABS
5.0000 mg | ORAL_TABLET | Freq: Once | ORAL | Status: AC
  Administered 2024-01-12: 5 mg via ORAL
  Filled 2024-01-12: qty 1

## 2024-01-12 NOTE — Telephone Encounter (Signed)
Returned call to patient to advise we do not prescribe pain medication. Advised patient Dr. Corliss Skains does not feel prescribing a muscle relaxer is the best option. Patient advised she should reach out to her PCP or seek evaluation with urgent care. Patient expressed understanding.

## 2024-01-12 NOTE — ED Notes (Addendum)
Patient is being discharged from the Urgent Care and sent to the Emergency Department via POV . Per Reita May, FNP, patient is in need of higher level of care due to severe neck pain. Patient is aware and verbalizes understanding of plan of care.  Vitals:   01/12/24 1635  BP: (!) 167/75  Pulse: 96  Resp: 18  Temp: 98.6 F (37 C)  SpO2: 93%

## 2024-01-12 NOTE — ED Triage Notes (Signed)
Pt c/o neck pain that began Wednesday. Pain goes from upper neck to shoulder.She also c/o tingling in left arm  She had strep and a cold earlier this month.

## 2024-01-12 NOTE — Telephone Encounter (Signed)
Patient called stating she has a pinched nerve in her neck and is requesting a prescription to help with the pain.

## 2024-01-12 NOTE — Telephone Encounter (Signed)
  Chief Complaint: neck pain Symptoms: neck pain Frequency:  worse over last week Pertinent Negatives: Patient denies fever, cough, SOB, weakness  Disposition: [] ED /[x] Urgent Care (no appt availability in office) / [] Appointment(In office/virtual)/ []  [facility]/ [] Home Care/ [] Refused Recommended Disposition /[] [facility]/ []  Follow-up with PCP  Additional Notes: Pt states her rheumatologist feels she may have a pinched nerve and pt states the pain has been continuaoulsy increasing over the past week. Pt statest that the pain has become unbearable and it hurts touch chin to chest. No fever and no recent illness. Pt agreeable to seek UC treatment.  Copied from CRM [id number]. Topic: Clinical - Red Word Triage >> Jan 12, 2024  2:52 PM Deaijah H wrote: Red Word that prompted transfer to Nurse Triage: Patients significant other called in due extreme pain in neck (think it's pinched nerve) /moving head is difficult Reason for Disposition  [1] SEVERE neck pain (e.g., excruciating, unable to do any normal activities) AND [2] not improved after 2 hours of pain medicine  Answer Assessment - Initial Assessment Questions 1. ONSET: "When did the pain begin?"      About a week ago it started increasing in pain 2. LOCATION: "Where does it hurt?"      Back of my head down to my L shoulder 3. PATTERN "Does the pain come and go, or has it been constant since it started?"      constant 4. SEVERITY: "How bad is the pain?"  (Scale 1-10; or mild, moderate, severe)   - NO PAIN (0): no pain or only slight stiffness    - MILD (1-3): doesn't interfere with normal activities    - MODERATE (4-7): interferes with normal activities or awakens from sleep    - SEVERE (8-10):  excruciating pain, unable to do any normal activities      10 5. RADIATION: "Does the pain go anywhere else, shoot into your arms?"     Denies 6. CORD SYMPTOMS: "Any weakness or numbness of the arms or legs?"      denies 7. CAUSE: "What do you think is causing the neck pain?"     Pinched nerve dx potentially 8. NECK OVERUSE: "Any recent activities that involved turning or twisting the neck?"     Denies new injury 9. OTHER SYMPTOMS: "Do you have any other symptoms?" (e.g., headache, fever, chest pain, difficulty breathing, neck swelling)     denies  Protocols used: Neck Pain or Stiffness-A-AH

## 2024-01-12 NOTE — ED Provider Notes (Signed)
[place] EMERGENCY DEPARTMENT AT [facility] Provider Note  CSN: [id number] Arrival date & time: 01/12/24 1756  Chief Complaint(s) Neck Pain and abnormal labs  HPI MATLYN SCRUTON is a 83 y.o. female with past medical history as below, significant for hyper tension, osteoporosis, polymyalgia rheumatica anxiety/depression, DDD who presents to the ED with complaint of neck pain.  Patient reports over the past 2 to 3 days has been having gradually worsening neck pain.  Pain primarily to the posterior just left of midline.  Pain worse with head turning.  Occasional paresthesias going down her arms left greater than right.  Slight headache.  Had some initial relief with heat but pain has persisted.  She was seen by rheumatology on 1/14, she was complaining of neck discomfort at that time.  She has history of polymyalgia rheumatica previously on prednisone and hydroxychloroquine, methotrexate.    Per recent rheum visit seems pt was going to restart methotrexate but has not resumed yet. Is scheduled to see them again on Monday for rpt eval  Ongoing neck pain, seen urgent care earlier today advised come to the ER for evaluation.  Was noted to have leukopenia on recent CBC, advised to come in for evaluation.  She has no complaints otherwise Past Medical History Past Medical History:  Diagnosis Date   Abnormal Pap smear of cervix    --hx abnormal paps in her 30s and per patient this was reason for her Hysterectomy   Anxiety    Bursitis 12/26/2020   Depression    Dry eye    Dyspnea on exertion 10/07/2020   Encounter for herpes zoster vaccination 10/18/2021   History of depression 08/12/2017   HSV-1 infection    Hypertension    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 Trial of PT/injection. If doesn't do well good candidate for reverse shoulder arthoplasty. Continue with PT (12/19). No specific f/u was scheduled.    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 Authorizing Provider  acetaminophen (TYLENOL) 500 MG tablet Take 1,000 mg by mouth at bedtime.    [provider]  amLODipine (NORVASC) 10 MG tablet Take 1 tablet (10 mg total) by mouth daily. 10/10/23   [name], MD  B Complex-Biotin-FA (B-COMPLEX PO) Take 1 tablet by mouth daily. Super    [provider]  Calcium Carb-Cholecalciferol (CALCIUM CARBONATE-VITAMIN D3) 600-400 MG-UNIT TABS Take 1 tablet by mouth daily.  07/23/15   [provider]  Cholecalciferol (VITAMIN D3) 50 MCG (2000 UT) TABS Take 2,000 mg by mouth daily.    [provider]  conjugated estrogens (PREMARIN) vaginal cream Place 1/2 gram per vagina at bedtime two to three times weekly. 10/18/23   Patton Salles, MD  cycloSPORINE (RESTASIS) 0.05 % ophthalmic  emulsion SMARTSIG:In Eye(s) 10/06/23   [provider]  denosumab (PROLIA) 60 MG/ML SOSY injection Inject 60 mg into the skin every 6 (six) months.    [provider]  Emollient (AMLACTIN ULTRA SMOOTHING) 15 % CREA as directed Externally 08/02/22   [provider]  lamoTRIgine (LAMICTAL) 150 MG tablet TAKE 1 TABLET BY MOUTH DAILY 01/10/24   Cottle, Steva Ready., MD  losartan (COZAAR) 25  MG tablet Take 1 tablet (25 mg total) by mouth daily. 09/08/23 12/07/23  Alver Sorrow, NP  Multiple Vitamins-Minerals (MULTIVITAMIN PO) Take 1 tablet by mouth daily. Centrum silver    [provider]  Omega-3 Fatty Acids (FISH OIL) 1000 MG CAPS Take 1,000 mg by mouth daily.     [provider]  ondansetron (ZOFRAN) 4 MG tablet Take 4 mg by mouth every 8 (eight) hours as needed for nausea. 09/01/23   [provider]  Polyethyl Glycol-Propyl Glycol (SYSTANE ULTRA OP) Place 1 drop into both eyes in the morning and at bedtime.     [provider]  valACYclovir (VALTREX) 1000 MG tablet Take two tablets ( total 2000 mg) by mouth q12h x 1 day; Start: ASAP after symptom onset Patient not taking: Reported on 01/09/2024 10/18/23   Patton Salles, MD  White Petrolatum-Mineral Oil (SYSTANE NIGHTTIME) OINT Place 1 application into both eyes at bedtime as needed (Dry eye).     [provider]                                                                                                                                    Past Surgical History Past Surgical History:  Procedure Laterality Date   ABDOMINAL HYSTERECTOMY     BREAST SURGERY  2016   benign mass removed in [place], [place].   FACIAL COSMETIC SURGERY     REVERSE SHOULDER ARTHROPLASTY Left 04/16/2020   Procedure: REVERSE SHOULDER ARTHROPLASTY;  Surgeon: Francena Hanly, MD;  Location: [facility];  Service: Orthopedics;  Laterality: Left;    TOTAL VAGINAL HYSTERECTOMY  age 5   --due to abnormal pap smears per patient   Family History Family History  Problem Relation Age of Onset   Congestive Heart Failure Mother    Stroke Mother    Diabetes Father    Colon cancer Brother    Diabetes Brother    Diabetes Brother    Stroke Brother    Heart attack Brother    Colon cancer Daughter    BRCA 1/2 Neg Hx    Breast cancer Neg Hx     Social History Social History   Tobacco Use   Smoking  status: Never    Passive exposure: Never   Smokeless tobacco: Never  Vaping Use   Vaping status: Never Used  Substance Use Topics   Alcohol use: Yes    Alcohol/week: 2.0 standard drinks of alcohol    Types: 2  Glasses of wine per week    Comment: wine occ   Drug use: Never   Allergies Patient has no known allergies.  Review of Systems Review of Systems  Constitutional:  Negative for chills and fever.  Respiratory:  Negative for apnea and shortness of breath.   Cardiovascular:  Negative for chest pain and palpitations.  Gastrointestinal:  Negative for abdominal pain and nausea.  Genitourinary:  Negative for dysuria.  Musculoskeletal:  Positive for arthralgias and neck pain.  Neurological:  Positive for headaches. Negative for syncope, weakness and light-headedness.       Tingling fingertips   Psychiatric/Behavioral:  Negative for agitation and behavioral problems.   All other systems reviewed and are negative.   Physical Exam Vital Signs  I have reviewed the triage vital signs BP (!) 160/87 (BP Location: Left Arm)   Pulse 86   Temp 98.5 F (36.9 C) (Oral)   Resp 15   Ht 5' (1.524 m)   Wt 54.4 kg   LMP  (LMP Unknown)   SpO2 98%   BMI 23.44 kg/m  Physical Exam Vitals and nursing note reviewed.  Constitutional:      General: She is not in acute distress.    Appearance: Normal appearance. She is not ill-appearing.  HENT:     Head: Normocephalic and atraumatic.      Right Ear: External ear normal.     Left Ear: External ear normal.     Nose: Nose normal.     Mouth/Throat:     Mouth: Mucous membranes are moist.  Eyes:     General: No scleral icterus.       Right eye: No discharge.        Left eye: No discharge.  Cardiovascular:     Rate and Rhythm: Normal rate and regular rhythm.     Pulses: Normal pulses.     Heart sounds: Normal heart sounds.  Pulmonary:     Effort: Pulmonary effort is normal. No respiratory distress.     Breath sounds: Normal breath  sounds. No stridor.  Abdominal:     General: Abdomen is flat. There is no distension.     Palpations: Abdomen is soft.     Tenderness: There is no abdominal tenderness.  Musculoskeletal:     Cervical back: No rigidity.     Right lower leg: No edema.     Left lower leg: No edema.  Skin:    General: Skin is warm and dry.     Capillary Refill: Capillary refill takes less than 2 seconds.  Neurological:     Mental Status: She is alert and oriented to person, place, and time.     GCS: GCS eye subscore is 4. GCS verbal subscore is 5. GCS motor subscore is 6.     Cranial Nerves: Cranial nerves 2-12 are intact.     Sensory: Sensation is intact.     Motor: Motor function is intact.     Coordination: Coordination is intact.  Psychiatric:        Mood and Affect: Mood normal.        Behavior: Behavior normal. Behavior is cooperative.     ED Results and Treatments Labs (all labs ordered are listed, but only abnormal results are displayed) Labs Reviewed  CBC WITH DIFFERENTIAL/PLATELET - Abnormal; Notable for the following components:      Result Value   Hemoglobin 11.7 (*)    HCT 35.1 (*)    Platelets 108 (*)    Lymphs  Abs 0.6 (*)    All other components within normal limits  BASIC METABOLIC PANEL - Abnormal; Notable for the following components:   Sodium 134 (*)    Glucose, Bld 106 (*)    All other components within normal limits                                                                                                                          Radiology No results found.  Pertinent labs & imaging results that were available during my care of the patient were reviewed by me and considered in my medical decision making (see MDM for details).  Medications Ordered in ED Medications  lidocaine (LIDODERM) 5 % 1 patch (1 patch Transdermal Patch Applied 01/12/24 2120)  morphine (PF) 4 MG/ML injection 4 mg (4 mg Intravenous Given 01/12/24 1931)  ondansetron (ZOFRAN) injection 4 mg (4  mg Intravenous Given 01/12/24 1925)  sodium chloride 0.9 % bolus 1,000 mL (0 mLs Intravenous Stopped 01/12/24 2146)  acetaminophen (TYLENOL) tablet 1,000 mg (1,000 mg Oral Given 01/12/24 1924)  iohexol (OMNIPAQUE) 350 MG/ML injection 75 mL (75 mLs Intravenous Contrast Given 01/12/24 1957)  cyclobenzaprine (FLEXERIL) tablet 5 mg (5 mg Oral Given 01/12/24 2119)  LORazepam (ATIVAN) tablet 1 mg (1 mg Oral Given 01/12/24 2340)  oxyCODONE (Oxy IR/ROXICODONE) immediate release tablet 5 mg (5 mg Oral Given 01/12/24 2340)                                                                                                                                     Procedures Procedures  (including critical care time)  Medical Decision Making / ED Course    Medical Decision Making:    ALISSON DINEEN is a 83 y.o. female with past medical history as below, significant for hyper tension, osteoporosis, polymyalgia rheumatica anxiety/depression, DDD who presents to the ED with complaint of neck pain.. The complaint involves an extensive differential diagnosis and also carries with it a high risk of complications and morbidity.  Serious etiology was considered. Ddx includes but is not limited to: MSK etiology, exacerbation of her polymyalgia rheumatica vascular normality, etc.  Complete initial physical exam performed, notably the patient was in no acute distress, her neuroexam is nonfocal..    Reviewed and confirmed nursing documentation for past medical history, family history, social history.  Vital signs reviewed.      Clinical Course  as of 01/13/24 0226  Fri Jan 12, 2024  2022 WBC: 5.8 WBC improved from recent value [SG]  2022 Platelets(!): 108 Low but improving from prior  [SG]  2109 CTA w/ LEFT vert occlusoin [SG]  2137 She has no dizziness, no numbness. Does have paresthesias down left arm, some to left over last few days.  She has difficulty turning her neck secondary to pain but when she turns her neck she  does not feel lightheaded or like she is going to have LOC. [SG]  2211 "IMPRESSION: 1. 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 " [SG]  2241 On recheck she is feeling better, improved ROM to her neck. No numbness or weakness to extremities [SG]  2252 Spoke w/ neurology dr Derry Lory, recommends MRI brain/c-spine w/o contrast. If this is negative can f/u with neuro in the office for EMG [SG]    Clinical Course User Index [SG] Sloan Leiter, DO    Brief summary: 83 year old female history of polymyalgia rheumatica here with neck pain ongoing with past few days.  Some paresthesias going down her left arm.  Neuroexam is intact.  Will collect screening labs given recent abnormal CBC.  Get CT imaging head and neck, give analgesia.  CBC improved from prior, unclear etiology of transient leukopenia and thrombocytopenia. She has no fever, feeling well other than neck pain.   She is feeling better on recheck.  She has left vertebral artery occlusion.  Recommend send to [facility] for MRI head and C-spine per neuro recommendations (Dr Derry Lory).  If this is negative for CVA can likely be discharged home with follow-up with neurology in the office/EMG. If positive for CVA consult neuro. Recommend multimodal pain control for home.  Pt/family prefer to travel POV. D/w pt and family risks of POV transfer and they are understanding, will go directly to [facility] ED for eval. Will call EMS if symptoms deteriorate or concerned for any worsening or worrisome symptoms while en route to ED.                Additional history obtained: -Additional history obtained from family -External records from outside source obtained and reviewed including: Chart review including previous notes, labs, imaging, consultation notes including  Prior urgent care note Primary care documentation Prior labs/meds   Lab Tests: -I ordered, reviewed, and  interpreted labs.   The pertinent results include:   Labs Reviewed  CBC WITH DIFFERENTIAL/PLATELET - Abnormal; Notable for the following components:      Result Value   Hemoglobin 11.7 (*)    HCT 35.1 (*)    Platelets 108 (*)    Lymphs Abs 0.6 (*)    All other components within normal limits  BASIC METABOLIC PANEL - Abnormal; Notable for the following components:   Sodium 134 (*)    Glucose, Bld 106 (*)    All other components within normal limits    Notable for labs stable  EKG   EKG Interpretation Date/Time:    Ventricular Rate:    PR Interval:    QRS Duration:    QT Interval:    QTC Calculation:   R Axis:      Text Interpretation:           Imaging Studies ordered: I ordered imaging studies including CTA head neck I independently visualized the following imaging with scope of interpretation limited to determining acute life threatening conditions related to emergency care; findings  noted above I independently visualized and interpreted imaging. I agree with the radiologist interpretation   Medicines ordered and prescription drug management: Meds ordered this encounter  Medications   morphine (PF) 4 MG/ML injection 4 mg   ondansetron (ZOFRAN) injection 4 mg   sodium chloride 0.9 % bolus 1,000 mL   acetaminophen (TYLENOL) tablet 1,000 mg   iohexol (OMNIPAQUE) 350 MG/ML injection 75 mL   lidocaine (LIDODERM) 5 % 1 patch   cyclobenzaprine (FLEXERIL) tablet 5 mg   LORazepam (ATIVAN) tablet 1 mg   oxyCODONE (Oxy IR/ROXICODONE) immediate release tablet 5 mg    Refill:  0    -I have reviewed the patients home medicines and have made adjustments as needed   Consultations Obtained: I requested consultation with the neurology,  and discussed lab and imaging findings as well as pertinent plan - they recommend: MRI head / neck   Cardiac Monitoring: The patient was maintained on a cardiac monitor.  I personally viewed and interpreted the cardiac monitored which  showed an underlying rhythm of: NSR Continuous pulse oximetry interpreted by myself, 97% on RA.    Social Determinants of Health:  Diagnosis or treatment significantly limited by social determinants of health: na   Reevaluation: After the interventions noted above, I reevaluated the patient and found that they have improved  Co morbidities that complicate the patient evaluation  Past Medical History:  Diagnosis Date   Abnormal Pap smear of cervix    --hx abnormal paps in her 30s and per patient this was reason for her Hysterectomy   Anxiety    Bursitis 12/26/2020   Depression    Dry eye    Dyspnea on exertion 10/07/2020   Encounter for herpes zoster vaccination 10/18/2021   History of depression 08/12/2017   HSV-1 infection    Hypertension    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 Trial of PT/injection. If doesn't do well good candidate for reverse shoulder arthoplasty. Continue with PT (12/19). No specific f/u was scheduled.    Osteoarthritis    hands   Osteoporosis    Polymyalgia rheumatica (HCC)       Dispostion: Disposition decision including need for hospitalization was considered, and patient transferred.    Final Clinical Impression(s) / ED Diagnoses Final diagnoses:  Occlusion of left vertebral artery  Torticollis  Paresthesia        Sloan Leiter, DO 01/13/24 [id number]
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
## 2024-01-12 NOTE — ED Triage Notes (Signed)
Pt c/o neck pain and low WBC count. Pt was seen at [facility] today for neck pain and was told to come to ED. Pt had blood work done at PCP on Wednesday and was called because her WBC count was very low.

## 2024-01-12 NOTE — Discharge Instructions (Addendum)
Please report to [facility] emergency department for MRI [address] [place] [address]

## 2024-01-13 ENCOUNTER — Emergency Department (HOSPITAL_COMMUNITY): Payer: Medicare Other

## 2024-01-13 DIAGNOSIS — D61818 Other pancytopenia: Secondary | ICD-10-CM | POA: Diagnosis present

## 2024-01-13 DIAGNOSIS — Z800 Family history of malignant neoplasm of digestive organs: Secondary | ICD-10-CM | POA: Diagnosis not present

## 2024-01-13 DIAGNOSIS — I10 Essential (primary) hypertension: Secondary | ICD-10-CM | POA: Diagnosis present

## 2024-01-13 DIAGNOSIS — Z8249 Family history of ischemic heart disease and other diseases of the circulatory system: Secondary | ICD-10-CM | POA: Diagnosis not present

## 2024-01-13 DIAGNOSIS — M542 Cervicalgia: Secondary | ICD-10-CM | POA: Diagnosis present

## 2024-01-13 DIAGNOSIS — E871 Hypo-osmolality and hyponatremia: Secondary | ICD-10-CM | POA: Diagnosis present

## 2024-01-13 DIAGNOSIS — M4802 Spinal stenosis, cervical region: Secondary | ICD-10-CM | POA: Diagnosis present

## 2024-01-13 DIAGNOSIS — M19042 Primary osteoarthritis, left hand: Secondary | ICD-10-CM | POA: Diagnosis present

## 2024-01-13 DIAGNOSIS — M19041 Primary osteoarthritis, right hand: Secondary | ICD-10-CM | POA: Diagnosis present

## 2024-01-13 DIAGNOSIS — I6502 Occlusion and stenosis of left vertebral artery: Principal | ICD-10-CM | POA: Diagnosis present

## 2024-01-13 DIAGNOSIS — Z79899 Other long term (current) drug therapy: Secondary | ICD-10-CM | POA: Diagnosis not present

## 2024-01-13 DIAGNOSIS — S161XXA Strain of muscle, fascia and tendon at neck level, initial encounter: Secondary | ICD-10-CM | POA: Diagnosis present

## 2024-01-13 DIAGNOSIS — M353 Polymyalgia rheumatica: Secondary | ICD-10-CM

## 2024-01-13 DIAGNOSIS — F32A Depression, unspecified: Secondary | ICD-10-CM | POA: Diagnosis present

## 2024-01-13 DIAGNOSIS — Z96612 Presence of left artificial shoulder joint: Secondary | ICD-10-CM | POA: Diagnosis present

## 2024-01-13 DIAGNOSIS — Z90710 Acquired absence of both cervix and uterus: Secondary | ICD-10-CM | POA: Diagnosis not present

## 2024-01-13 DIAGNOSIS — F3181 Bipolar II disorder: Secondary | ICD-10-CM | POA: Diagnosis present

## 2024-01-13 DIAGNOSIS — Z823 Family history of stroke: Secondary | ICD-10-CM | POA: Diagnosis not present

## 2024-01-13 DIAGNOSIS — Z833 Family history of diabetes mellitus: Secondary | ICD-10-CM | POA: Diagnosis not present

## 2024-01-13 LAB — CBC
HCT: 34.4 % — ABNORMAL LOW (ref 36.0–46.0)
Hemoglobin: 11.2 g/dL — ABNORMAL LOW (ref 12.0–15.0)
MCH: 30 pg (ref 26.0–34.0)
MCHC: 32.6 g/dL (ref 30.0–36.0)
MCV: 92.2 fL (ref 80.0–100.0)
Platelets: 111 10*3/uL — ABNORMAL LOW (ref 150–400)
RBC: 3.73 MIL/uL — ABNORMAL LOW (ref 3.87–5.11)
RDW: 14.4 % (ref 11.5–15.5)
WBC: 5.2 10*3/uL (ref 4.0–10.5)
nRBC: 0 % (ref 0.0–0.2)

## 2024-01-13 LAB — COMPREHENSIVE METABOLIC PANEL
ALT: 34 U/L (ref 0–44)
AST: 39 U/L (ref 15–41)
Albumin: 3.1 g/dL — ABNORMAL LOW (ref 3.5–5.0)
Alkaline Phosphatase: 61 U/L (ref 38–126)
Anion gap: 10 (ref 5–15)
BUN: 9 mg/dL (ref 8–23)
CO2: 23 mmol/L (ref 22–32)
Calcium: 8.4 mg/dL — ABNORMAL LOW (ref 8.9–10.3)
Chloride: 102 mmol/L (ref 98–111)
Creatinine, Ser: 0.72 mg/dL (ref 0.44–1.00)
GFR, Estimated: >60 mL/min (ref >60)
Glucose, Bld: 111 mg/dL — ABNORMAL HIGH (ref 70–99)
Potassium: 4.1 mmol/L (ref 3.5–5.1)
Sodium: 135 mmol/L (ref 135–145)
Total Bilirubin: 0.6 mg/dL (ref 0.0–1.2)
Total Protein: 6.7 g/dL (ref 6.5–8.1)

## 2024-01-13 LAB — LIPID PANEL
Cholesterol: 143 mg/dL (ref 0–200)
HDL: 77 mg/dL (ref >40)
LDL Cholesterol: 54 mg/dL (ref 0–99)
Total CHOL/HDL Ratio: 1.9 {ratio}
Triglycerides: 58 mg/dL (ref <150)
VLDL: 12 mg/dL (ref 0–40)

## 2024-01-13 LAB — TROPONIN I (HIGH SENSITIVITY): Troponin I (High Sensitivity): 9 ng/L (ref <18)

## 2024-01-13 MED ORDER — LAMOTRIGINE 100 MG PO TABS
150.0000 mg | ORAL_TABLET | Freq: Every day | ORAL | Status: DC
  Administered 2024-01-13 – 2024-01-16 (×4): 150 mg via ORAL
  Filled 2024-01-13: qty 6
  Filled 2024-01-13 (×3): qty 2

## 2024-01-13 MED ORDER — ACETAMINOPHEN 325 MG PO TABS: 650.0000 mg | ORAL_TABLET | Freq: Four times a day (QID) | ORAL | Status: DC | PRN

## 2024-01-13 MED ORDER — ARTIFICIAL TEARS OPHTHALMIC OINT: 1.0000 | TOPICAL_OINTMENT | Freq: Every evening | OPHTHALMIC | Status: DC | PRN

## 2024-01-13 MED ORDER — ONDANSETRON HCL 4 MG/2ML IJ SOLN: 4.0000 mg | Freq: Four times a day (QID) | INTRAMUSCULAR | Status: DC | PRN

## 2024-01-13 MED ORDER — GABAPENTIN 100 MG PO CAPS
100.0000 mg | ORAL_CAPSULE | Freq: Three times a day (TID) | ORAL | Status: DC
  Administered 2024-01-13 – 2024-01-16 (×11): 100 mg via ORAL
  Filled 2024-01-13 (×11): qty 1

## 2024-01-13 MED ORDER — ACETAMINOPHEN 500 MG PO TABS
1000.0000 mg | ORAL_TABLET | Freq: Every day | ORAL | Status: DC
  Administered 2024-01-13 – 2024-01-15 (×3): 1000 mg via ORAL
  Filled 2024-01-13 (×3): qty 2

## 2024-01-13 MED ORDER — ENOXAPARIN SODIUM 40 MG/0.4ML IJ SOSY
40.0000 mg | PREFILLED_SYRINGE | INTRAMUSCULAR | Status: DC
  Administered 2024-01-13 – 2024-01-15 (×3): 40 mg via SUBCUTANEOUS
  Filled 2024-01-13 (×3): qty 0.4

## 2024-01-13 MED ORDER — DEXAMETHASONE SODIUM PHOSPHATE 10 MG/ML IJ SOLN
10.0000 mg | Freq: Once | INTRAMUSCULAR | Status: AC
  Administered 2024-01-13: 10 mg via INTRAVENOUS
  Filled 2024-01-13: qty 1

## 2024-01-13 MED ORDER — POLYETHYL GLYCOL-PROPYL GLYCOL 0.4-0.3 % OP SOLN: 1.0000 [drp] | Freq: Two times a day (BID) | OPHTHALMIC | Status: DC

## 2024-01-13 MED ORDER — ALBUTEROL SULFATE (2.5 MG/3ML) 0.083% IN NEBU
2.5000 mg | INHALATION_SOLUTION | Freq: Four times a day (QID) | RESPIRATORY_TRACT | Status: DC | PRN
Start: 2024-01-13 — End: 2024-01-16

## 2024-01-13 MED ORDER — HYDROMORPHONE HCL 1 MG/ML IJ SOLN: 0.5000 mg | INTRAMUSCULAR | Status: DC | PRN

## 2024-01-13 MED ORDER — POLYVINYL ALCOHOL 1.4 % OP SOLN
1.0000 [drp] | Freq: Two times a day (BID) | OPHTHALMIC | Status: DC
  Administered 2024-01-13 – 2024-01-16 (×5): 1 [drp] via OPHTHALMIC
  Filled 2024-01-13: qty 15

## 2024-01-13 MED ORDER — OXYCODONE-ACETAMINOPHEN 5-325 MG PO TABS
1.0000 | ORAL_TABLET | Freq: Four times a day (QID) | ORAL | Status: DC | PRN
  Administered 2024-01-13: 1 via ORAL
  Filled 2024-01-13: qty 1

## 2024-01-13 MED ORDER — LOSARTAN POTASSIUM 25 MG PO TABS
25.0000 mg | ORAL_TABLET | Freq: Every day | ORAL | Status: DC
Start: 2024-01-13 — End: 2024-01-16
  Administered 2024-01-13 – 2024-01-16 (×4): 25 mg via ORAL
  Filled 2024-01-13 (×4): qty 1

## 2024-01-13 MED ORDER — ONDANSETRON HCL 4 MG PO TABS: 4.0000 mg | ORAL_TABLET | Freq: Four times a day (QID) | ORAL | Status: DC | PRN

## 2024-01-13 MED ORDER — AMLODIPINE BESYLATE 10 MG PO TABS
10.0000 mg | ORAL_TABLET | Freq: Every day | ORAL | Status: DC
  Administered 2024-01-13 – 2024-01-16 (×4): 10 mg via ORAL
  Filled 2024-01-13 (×2): qty 1
  Filled 2024-01-13: qty 2
  Filled 2024-01-13: qty 1

## 2024-01-13 MED ORDER — DEXAMETHASONE SODIUM PHOSPHATE 4 MG/ML IJ SOLN
4.0000 mg | Freq: Four times a day (QID) | INTRAMUSCULAR | Status: DC
  Administered 2024-01-13 – 2024-01-16 (×12): 4 mg via INTRAVENOUS
  Filled 2024-01-13 (×13): qty 1

## 2024-01-13 MED ORDER — MORPHINE SULFATE (PF) 4 MG/ML IV SOLN
6.0000 mg | Freq: Once | INTRAVENOUS | Status: AC
  Administered 2024-01-13: 6 mg via INTRAVENOUS
  Filled 2024-01-13: qty 2

## 2024-01-13 MED ORDER — ACETAMINOPHEN 650 MG RE SUPP: 650.0000 mg | Freq: Four times a day (QID) | RECTAL | Status: DC | PRN

## 2024-01-13 MED ORDER — HYDRALAZINE HCL 20 MG/ML IJ SOLN: 10.0000 mg | INTRAMUSCULAR | Status: DC | PRN

## 2024-01-13 MED ORDER — SODIUM CHLORIDE 0.9% FLUSH
3.0000 mL | Freq: Two times a day (BID) | INTRAVENOUS | Status: DC
  Administered 2024-01-13 – 2024-01-16 (×7): 3 mL via INTRAVENOUS

## 2024-01-13 NOTE — Plan of Care (Signed)

## 2024-01-13 NOTE — Progress Notes (Signed)
New Admission Note:   Arrival Method: stretcher Mental Orientation: aa+ox4 Telemetry: box 3 Assessment: Completed Skin: C/D/I IV: right forearm SL Pain: denies Tubes: n/a Safety Measures: Safety Fall Prevention Plan has been given, discussed and signed Admission: Completed [facility] Orientation: Patient has been orientated to the room, unit and staff.  Family: friend present  Orders have been reviewed and implemented. Will continue to monitor the patient. Call light has been placed within reach and bed alarm has been activated.   Margarita Grizzle, RN
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
## 2024-01-13 NOTE — ED Notes (Signed)
Pt transported to MRI from lobby and will be transported to room from MRI.

## 2024-01-13 NOTE — ED Provider Notes (Signed)
Patient Christine Scott heard from [facility] the MRI to evaluate for severe left shoulder and upper arm pain.  Pain going on for few days.  Had multiple treatments over there without relief.  Also found to have a small area of vertebral artery occlusion. 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  This is likely the source of her discomfort.  She is received so much pain medicine and other medications will discuss with neurosurgery if there is any indication for admission.  She does not have any motor changes at this time.  D/w NSG, ok to admit for pain control to medicine, they will consult. Neuro intact at this time aside from effects of opiates.    Jorita Bohanon, Barbara Cower, MD 01/15/24 [phone]

## 2024-01-13 NOTE — Consult Note (Signed)
Reason for Consult:cervical stenosis  Referring Physician: EDP  Christine Scott is an 83 y.o. female.   HPI:  83 year old female presented to the ED last night with progressive neck pain. She was getting her hair done on Wednesday and since then has had neck pain. She denies any NT or pain in her arms. Denies any weakness. The pain has not been controlled at home. Has seen [facility] for her lower back before. 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 Trial of PT/injection. If doesn't do well good candidate for reverse shoulder arthoplasty. Continue with PT (12/19). No specific f/u was scheduled.    Osteoarthritis    hands   Osteoporosis    Polymyalgia rheumatica (HCC)     Past Surgical History:  Procedure Laterality Date   ABDOMINAL HYSTERECTOMY     BREAST SURGERY  2016   benign mass removed in [place], [place].   FACIAL COSMETIC SURGERY     REVERSE SHOULDER ARTHROPLASTY Left 04/16/2020   Procedure: REVERSE SHOULDER ARTHROPLASTY;  Surgeon: Francena Hanly, MD;  Location: [facility];  Service: Orthopedics;  Laterality: Left;    TOTAL VAGINAL HYSTERECTOMY  age 46   --due to abnormal pap smears per patient    No Known Allergies  Social History   Tobacco Use   Smoking status: Never    Passive exposure: Never   Smokeless tobacco: Never  Substance Use Topics   Alcohol use: Yes    Alcohol/week: 2.0 standard drinks of alcohol    Types: 2 Glasses of wine per week    Comment: wine occ    Family History  Problem Relation Age of Onset    Congestive Heart Failure Mother    Stroke Mother    Diabetes Father    Colon cancer Brother    Diabetes Brother    Diabetes Brother    Stroke Brother    Heart attack Brother    Colon cancer Daughter    BRCA 1/2 Neg Hx    Breast cancer Neg Hx      Review of Systems  Positive ROS: as above  All other systems have been reviewed and were otherwise negative with the exception of those mentioned in the HPI and as above.  Objective: Vital signs in last 24 hours: Temp:  [98 F (36.7 C)-99 F (37.2 C)] 98 F (36.7 C) (01/18 0337) Pulse Rate:  [79-99] 79 (01/18 0815) Resp:  [13-24] 17 (01/18 0815) BP: (141-176)/(64-88) 141/70 (01/18 0815) SpO2:  [90 %-98 %] 93 % (01/18 0815) Weight:  [54.4 kg] 54.4 kg (01/17 1825)  General Appearance: Alert, cooperative, no distress, appears stated age Head: Normocephalic, without obvious abnormality, atraumatic Neck: Supple, symmetrical, trachea midline, no adenopathy; thyroid: No enlargement/tenderness/nodules; no carotid bruit or JVD Back: Symmetric, no curvature, ROM normal, no CVA tenderness Lungs: respirations unlabored,  Heart: Regular rate and rhythm Extremities: Extremities normal, atraumatic, no cyanosis or edema Pulses: 2+ and symmetric all extremities Skin: Skin  color, texture, turgor normal, no rashes or lesions  NEUROLOGIC:   Mental status: A&O x4, no aphasia, good attention span, Memory and fund of knowledge Motor Exam - grossly normal, normal tone and bulk Sensory Exam - grossly normal Reflexes: symmetric, no pathologic reflexes, No Hoffman's, No clonus Coordination - grossly normal Gait - not tested Balance - not tested Cranial Nerves: I: smell Not tested  II: visual acuity  OS: na    OD: na  II: visual fields Full to confrontation  II: pupils Equal, round, reactive to light  III,VII: ptosis None  III,IV,VI: extraocular muscles  Full ROM  V: mastication   V: facial light touch sensation    V,VII: corneal reflex     VII: facial muscle function - upper    VII: facial muscle function - lower   VIII: hearing   IX: soft palate elevation    IX,X: gag reflex   XI: trapezius strength    XI: sternocleidomastoid strength   XI: neck flexion strength    XII: tongue strength      Data Review Lab Results  Component Value Date   WBC 5.2 01/13/2024   HGB 11.2 (L) 01/13/2024   HCT 34.4 (L) 01/13/2024   MCV 92.2 01/13/2024   PLT 111 (L) 01/13/2024   Lab Results  Component Value Date   NA 135 01/13/2024   K 4.1 01/13/2024   CL 102 01/13/2024   CO2 23 01/13/2024   BUN 9 01/13/2024   CREATININE 0.72 01/13/2024   GLUCOSE 111 (H) 01/13/2024   No results found for: "INR", "PROTIME"  Radiology: MR BRAIN WO CONTRAST Result Date: 01/13/2024 CLINICAL DATA:  Left arm paresthesia EXAM: MRI HEAD WITHOUT CONTRAST MRI CERVICAL SPINE WITHOUT CONTRAST TECHNIQUE: Multiplanar, multiecho pulse sequences of the brain and surrounding structures, and cervical spine, to include the craniocervical junction and cervicothoracic junction, were obtained without intravenous contrast. COMPARISON:  None Available. FINDINGS: MRI HEAD FINDINGS Brain: No acute infarct, mass effect or extra-axial collection. No acute or chronic hemorrhage. There is multifocal hyperintense T2-weighted signal within the white matter. Generalized volume loss. The midline structures are normal. Vascular: Normal flow voids. Skull and upper cervical spine: Normal calvarium and skull base. Visualized upper cervical spine and soft tissues are normal. Sinuses/Orbits:No paranasal sinus fluid levels or advanced mucosal thickening. No mastoid or middle ear effusion. Normal orbits. MRI CERVICAL SPINE FINDINGS Alignment: Grade 1 anterolisthesis at C3-4. Grade 2 anterolisthesis at C4-5 has worsened. Vertebrae: Modic type 1 endplate signal changes at C3-4. No acute abnormality. Cord: No signal change. Posterior Fossa, vertebral arteries, paraspinal tissues: Negative. Disc  levels: C1-2: Unremarkable. C2-3: Normal disc space and facet joints. There is no spinal canal stenosis. No neural foraminal stenosis. C3-4: Small disc osteophyte complex. Moderate left facet arthrosis. Worsened severe spinal canal stenosis. Worsened severe left neural foraminal stenosis. Moderate mass effect on the spinal cord C4-5: Mild facet arthrosis. Disc uncovering without focal herniation. Unchanged severe spinal canal stenosis. Unchanged bilateral neural foraminal stenosis. C5-6: Small disc bulge. There is no spinal canal stenosis. No neural foraminal stenosis. C6-7: Small disc bulge with endplate spurring. There is no spinal canal stenosis. Unchanged moderate bilateral neural foraminal stenosis. C7-T1: Normal disc space and facet joints. There is no spinal canal stenosis. No neural foraminal stenosis. IMPRESSION: 1. No acute intracranial abnormality. 2. Worsened severe spinal canal stenosis and severe left neural foraminal stenosis at C3-4. Moderate mass effect on the spinal cord. 3. Unchanged severe spinal canal stenosis and bilateral neural foraminal stenosis  at C4-5. 4. Unchanged moderate bilateral C6-7 neural foraminal stenosis. Electronically Signed   By: Deatra Robinson M.D.   On: 01/13/2024 03:03   MR Cervical Spine Wo Contrast Result Date: 01/13/2024 CLINICAL DATA:  Left arm paresthesia EXAM: MRI HEAD WITHOUT CONTRAST MRI CERVICAL SPINE WITHOUT CONTRAST TECHNIQUE: Multiplanar, multiecho pulse sequences of the brain and surrounding structures, and cervical spine, to include the craniocervical junction and cervicothoracic junction, were obtained without intravenous contrast. COMPARISON:  None Available. FINDINGS: MRI HEAD FINDINGS Brain: No acute infarct, mass effect or extra-axial collection. No acute or chronic hemorrhage. There is multifocal hyperintense T2-weighted signal within the white matter. Generalized volume loss. The midline structures are normal. Vascular: Normal flow voids. Skull and  upper cervical spine: Normal calvarium and skull base. Visualized upper cervical spine and soft tissues are normal. Sinuses/Orbits:No paranasal sinus fluid levels or advanced mucosal thickening. No mastoid or middle ear effusion. Normal orbits. MRI CERVICAL SPINE FINDINGS Alignment: Grade 1 anterolisthesis at C3-4. Grade 2 anterolisthesis at C4-5 has worsened. Vertebrae: Modic type 1 endplate signal changes at C3-4. No acute abnormality. Cord: No signal change. Posterior Fossa, vertebral arteries, paraspinal tissues: Negative. Disc levels: C1-2: Unremarkable. C2-3: Normal disc space and facet joints. There is no spinal canal stenosis. No neural foraminal stenosis. C3-4: Small disc osteophyte complex. Moderate left facet arthrosis. Worsened severe spinal canal stenosis. Worsened severe left neural foraminal stenosis. Moderate mass effect on the spinal cord C4-5: Mild facet arthrosis. Disc uncovering without focal herniation. Unchanged severe spinal canal stenosis. Unchanged bilateral neural foraminal stenosis. C5-6: Small disc bulge. There is no spinal canal stenosis. No neural foraminal stenosis. C6-7: Small disc bulge with endplate spurring. There is no spinal canal stenosis. Unchanged moderate bilateral neural foraminal stenosis. C7-T1: Normal disc space and facet joints. There is no spinal canal stenosis. No neural foraminal stenosis. IMPRESSION: 1. No acute intracranial abnormality. 2. Worsened severe spinal canal stenosis and severe left neural foraminal stenosis at C3-4. Moderate mass effect on the spinal cord. 3. Unchanged severe spinal canal stenosis and bilateral neural foraminal stenosis at C4-5. 4. Unchanged moderate bilateral C6-7 neural foraminal stenosis. Electronically Signed   By: Deatra Robinson M.D.   On: 01/13/2024 03:03   CT ANGIO HEAD NECK W WO CM Result Date: 01/12/2024 CLINICAL DATA:  Headache and neck pain EXAM: CT ANGIOGRAPHY HEAD AND NECK WITH AND WITHOUT CONTRAST TECHNIQUE: Multidetector  CT imaging of the head and neck was performed using the standard protocol during bolus administration of intravenous contrast. Multiplanar CT image reconstructions and MIPs were obtained to evaluate the vascular anatomy. Carotid stenosis measurements (when applicable) are obtained utilizing NASCET criteria, using the distal internal carotid diameter as the denominator. RADIATION DOSE REDUCTION: This exam was performed according to the departmental dose-optimization program which includes automated exposure control, adjustment of the mA and/or kV according to patient size and/or use of iterative reconstruction technique. CONTRAST:  75mL OMNIPAQUE IOHEXOL 350 MG/ML SOLN COMPARISON:  None Available. FINDINGS: CT HEAD FINDINGS Brain: There is no mass, hemorrhage or extra-axial collection. There is generalized atrophy without lobar predilection. Hypodensity of the white matter is most commonly associated with chronic microvascular disease. Vascular: No hyperdense vessel or unexpected vascular calcification. Skull: The visualized skull base, calvarium and extracranial soft tissues are normal. Sinuses/Orbits: No fluid levels or advanced mucosal thickening of the visualized paranasal sinuses. No mastoid or middle ear effusion. Normal orbits. CTA NECK FINDINGS Skeleton: No acute abnormality or high grade bony spinal canal stenosis. Other neck: Normal pharynx, larynx and  major salivary glands. No cervical lymphadenopathy. Unremarkable thyroid gland. Upper chest: No pneumothorax or pleural effusion. No nodules or masses. Aortic arch: There is calcific atherosclerosis of the aortic arch. Conventional 3 vessel aortic branching pattern. RIGHT carotid system: Normal without aneurysm, dissection or stenosis. LEFT carotid system: Normal without aneurysm, dissection or stenosis. Vertebral arteries: Right dominant configuration. There is a short segment occlusion of the left V2 segment at the level of the C2 transverse process. The V3  and V4 segments are normal. The right vertebral artery is normal. CTA HEAD FINDINGS POSTERIOR CIRCULATION: Vertebral arteries are normal. No proximal occlusion of the anterior or inferior cerebellar arteries. Basilar artery is normal. Superior cerebellar arteries are normal. Posterior cerebral arteries are normal. ANTERIOR CIRCULATION: Intracranial internal carotid arteries are normal. Anterior cerebral arteries are normal. Middle cerebral arteries are normal. Venous sinuses: As permitted by contrast timing, patent. Anatomic variants: Fetal origins of both posterior cerebral arteries. Review of the MIP images confirms the above findings. IMPRESSION: 1. 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 2. No intracranial large vessel occlusion or significant stenosis. Aortic Atherosclerosis (ICD10-I70.0). Electronically Signed   By: Deatra Robinson M.D.   On: 01/12/2024 21:04     Assessment/Plan: 83 year old female presented with progressive neck pain after a hair appt on Wednesday. 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 Unfortunately this is complicated by her severe osteoporosis with a T score of -3.4. She would likely need an anterior and posterior surgery which would be quite extensive but she does need decompression of her spinal cord. She is an established patient Dr. Jordan Likes and [facility] so we discussed giving her the option of who she would like to follow up with outpatient. Once pain is controlled and she can safely ambulate, ok to go home from our standpoint.   Tiana Loft Krishiv Sandler 01/13/2024 10:02 AM

## 2024-01-14 DIAGNOSIS — M4802 Spinal stenosis, cervical region: Secondary | ICD-10-CM | POA: Diagnosis not present

## 2024-01-14 DIAGNOSIS — F3181 Bipolar II disorder: Secondary | ICD-10-CM | POA: Diagnosis not present

## 2024-01-14 DIAGNOSIS — E871 Hypo-osmolality and hyponatremia: Secondary | ICD-10-CM | POA: Diagnosis not present

## 2024-01-14 DIAGNOSIS — M542 Cervicalgia: Secondary | ICD-10-CM | POA: Diagnosis not present

## 2024-01-14 LAB — BASIC METABOLIC PANEL
Anion gap: 11 (ref 5–15)
BUN: 20 mg/dL (ref 8–23)
CO2: 22 mmol/L (ref 22–32)
Calcium: 9 mg/dL (ref 8.9–10.3)
Chloride: 100 mmol/L (ref 98–111)
Creatinine, Ser: 0.67 mg/dL (ref 0.44–1.00)
GFR, Estimated: >60 mL/min (ref >60)
Glucose, Bld: 150 mg/dL — ABNORMAL HIGH (ref 70–99)
Potassium: 4.5 mmol/L (ref 3.5–5.1)
Sodium: 133 mmol/L — ABNORMAL LOW (ref 135–145)

## 2024-01-14 LAB — CBC
HCT: 35 % — ABNORMAL LOW (ref 36.0–46.0)
Hemoglobin: 11.7 g/dL — ABNORMAL LOW (ref 12.0–15.0)
MCH: 29.7 pg (ref 26.0–34.0)
MCHC: 33.4 g/dL (ref 30.0–36.0)
MCV: 88.8 fL (ref 80.0–100.0)
Platelets: 164 10*3/uL (ref 150–400)
RBC: 3.94 MIL/uL (ref 3.87–5.11)
RDW: 14.1 % (ref 11.5–15.5)
WBC: 5 10*3/uL (ref 4.0–10.5)
nRBC: 0 % (ref 0.0–0.2)

## 2024-01-14 MED ORDER — ORAL CARE MOUTH RINSE: 15.0000 mL | OROMUCOSAL | Status: DC | PRN

## 2024-01-14 NOTE — Evaluation (Signed)
Physical Therapy Evaluation Patient Details Name: Christine Scott MRN: [id number] DOB: 1941-04-08 Today's Date: 01/14/2024  History of Present Illness  83 y/o F presenting to ED on 1/17 with L neck pain, MRI C spine significant for worsenign severe stenosis and severe L neuroforaminal stenosis at C3-4 with mass effect on spinal cord. Neuro recommending following up outpatient for possibel ACDF. PMH includes HTN, osteoporosis, polymyalgia, rheumatica anxiety/depression, DDD  Clinical Impression   Pt admitted with above diagnosis. Lives at home alone, in a single-level home with a level entry; Prior to admission, pt was completely independent, including driving; Presents to PT with some neck pain and concerns re: getting in and OOB, but overall moving well; Anticipate dc soon; Rec possible Outpt PT -- The potential need for Outpatient PT can be addressed at Neurosurgery follow-up appointments;  Pt currently with functional limitations due to the deficits listed below (see PT Problem List). Pt will benefit from skilled PT to increase their independence and safety with mobility to allow discharge to the venue listed below.           If plan is discharge home, recommend the following: Assistance with cooking/housework   Can travel by private vehicle        Equipment Recommendations None recommended by PT  Recommendations for Other Services       Functional Status Assessment Patient has had a recent decline in their functional status and demonstrates the ability to make significant improvements in function in a reasonable and predictable amount of time.     Precautions / Restrictions Precautions Precaution Comments: utilized cervical prec for comfort Restrictions Weight Bearing Restrictions Per Provider Order: No      Mobility  Bed Mobility Overal bed mobility: Needs Assistance Bed Mobility: Sit to Supine, Rolling, Sidelying to Sit Rolling: Supervision Sidelying to sit: Supervision        General bed mobility comments: cues for log roll technqiue    Transfers Overall transfer level: Needs assistance Equipment used: None Transfers: Sit to/from Stand Sit to Stand: Supervision                Ambulation/Gait Ambulation/Gait assistance: Supervision Gait Distance (Feet): 300 Feet Assistive device: None Gait Pattern/deviations: Step-through pattern       General Gait Details: No overt difficutly noted  Stairs            Wheelchair Mobility     Tilt Bed    Modified Rankin (Stroke Patients Only)       Balance Overall balance assessment: No apparent balance deficits (not formally assessed)                                           Pertinent Vitals/Pain Pain Assessment Pain Assessment: 0-10 Pain Score: 5  Faces Pain Scale: Hurts little more Pain Location: L side of neck, with L rotation Pain Descriptors / Indicators: Discomfort Pain Intervention(s): Limited activity within patient's tolerance, Monitored during session    Home Living Family/patient expects to be discharged to:: Private residence Living Arrangements: Alone Available Help at Discharge: Family;Available PRN/intermittently;Friend(s) (daughter & friends) Type of Home: Other(Comment) (condo) Home Access: Elevator       Home Layout: One level Home Equipment: Shower seat - built in;Grab bars - tub/shower;Cane - single Librarian, academic (2 wheels)      Prior Function Prior Level of Function : Independent/Modified Independent;Driving  Mobility Comments: no AD use ADLs Comments: ind, manages own meds     Extremity/Trunk Assessment   Upper Extremity Assessment Upper Extremity Assessment: Defer to OT evaluation LUE Deficits / Details: mild limitations in shoulder flexion compared to RUE, still [facility]    Lower Extremity Assessment Lower Extremity Assessment: Overall WFL for tasks assessed;Generalized weakness    Cervical /  Trunk Assessment Cervical / Trunk Assessment: Other exceptions Cervical / Trunk Exceptions: No overt neck pain with neutral positioning; increases to 5/10 with L rotation  Communication   Communication Communication: Hearing impairment Cueing Techniques: Other (comments) (no hearing aids here at hospital)  Cognition Arousal: Alert Behavior During Therapy: [facility] for tasks assessed/performed Overall Cognitive Status: Within Functional Limits for tasks assessed                                          General Comments General comments (skin integrity, edema, etc.): NAD on room air; pt reports her biggest concern is neck pain with getting up to sitting EOB and laying back down    Exercises     Assessment/Plan    PT Assessment Patient needs continued PT services  PT Problem List Decreased strength;Decreased knowledge of precautions;Pain       PT Treatment Interventions DME instruction;Gait training;Stair training;Functional mobility training;Therapeutic activities;Therapeutic exercise;Patient/family education;Manual techniques;Modalities    PT Goals (Current goals can be found in the Care Plan section)  Acute Rehab PT Goals Patient Stated Goal: to be able to get in and OOB without extra pain PT Goal Formulation: With patient Time For Goal Achievement: 01/21/24 Potential to Achieve Goals: Good    Frequency Min 1X/week     Co-evaluation               AM-PAC PT "6 Clicks" Mobility  Outcome Measure Help needed turning from your back to your side while in a flat bed without using bedrails?: A Little Help needed moving from lying on your back to sitting on the side of a flat bed without using bedrails?: A Little Help needed moving to and from a bed to a chair (including a wheelchair)?: None Help needed standing up from a chair using your arms (e.g., wheelchair or bedside chair)?: None Help needed to walk in hospital room?: None Help needed climbing 3-5 steps  with a railing? : A Little 6 Click Score: 21    End of Session   Activity Tolerance: Patient tolerated treatment well Patient left: in chair;with call bell/phone within reach Nurse Communication: Mobility status PT Visit Diagnosis: Other abnormalities of gait and mobility (R26.89);Pain Pain - Right/Left: Left Pain - part of body:  (neck pain)    Time: 7829-5621 PT Time Calculation (min) (ACUTE ONLY): 24 min   Charges:   PT Evaluation $PT Eval Low Complexity: 1 Low PT Treatments $Gait Training: 8-22 mins PT General Charges $$ ACUTE PT VISIT: 1 Visit         Van Clines, PT  Acute Rehabilitation Services Office [phone] Secure Chat welcomed   Levi Aland 01/14/2024, 11:50 AM

## 2024-01-14 NOTE — Plan of Care (Signed)
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
## 2024-01-14 NOTE — Evaluation (Signed)
Occupational Therapy Evaluation Patient Details Name: Christine Scott MRN: [id number] DOB: [birth year] Today's Date: 01/14/2024   History of Present Illness 83 y/o F presenting to ED on 1/17 with L neck pain, MRI C spine significant for worsenign severe stenosis and severe L neuroforaminal stenosis at C3-4 with mass effect on spinal cord. Neuro recommending following up outpatient for possibel ACDF. PMH includes HTN, osteoporosis, polymyalgia, rheumatica anxiety/depression, DDD   Clinical Impression   Pt reports ind at baseline with ADL/functional mobility, lives alone but has PRN assist from family/friends. Pt needing supervision for ADLs, bed mobility and transfers. Pt reports incr neck pain with turning to L, educated pt on cervical prec for comfort and compensatory strategies for ADLs, educated on using shower seat at home for safety.  Pt presenting with impairments listed below, will follow acutely. Anticipate no OT follow up needs at d/c.      If plan is discharge home, recommend the following: A little help with bathing/dressing/bathroom;Assistance with cooking/housework;Assist for transportation;Help with stairs or ramp for entrance    Functional Status Assessment  Patient has had a recent decline in their functional status and demonstrates the ability to make significant improvements in function in a reasonable and predictable amount of time.  Equipment Recommendations  None recommended by OT    Recommendations for Other Services       Precautions / Restrictions Precautions Precaution Comments: utilized cervical prec for comfort Restrictions Weight Bearing Restrictions Per Provider Order: No      Mobility Bed Mobility Overal bed mobility: Needs Assistance Bed Mobility: Supine to Sit, Sit to Supine     Supine to sit: Supervision Sit to supine: Supervision   General bed mobility comments: pivots to EOB, educated on using pillows at home for support when sleeping if  uncomfortable laying flat    Transfers Overall transfer level: Needs assistance Equipment used: None Transfers: Sit to/from Stand Sit to Stand: Supervision                  Balance Overall balance assessment: No apparent balance deficits (not formally assessed)                                         ADL either performed or assessed with clinical judgement   ADL Overall ADL's : Needs assistance/impaired Eating/Feeding: Set up;Sitting   Grooming: Standing;Wash/dry hands;Supervision/safety   Upper Body Bathing: Supervision/ safety   Lower Body Bathing: Supervison/ safety Lower Body Bathing Details (indicate cue type and reason): demos figure 4 Upper Body Dressing : Supervision/safety   Lower Body Dressing: Supervision/safety   Toilet Transfer: Supervision/safety   Toileting- Clothing Manipulation and Hygiene: Supervision/safety       Functional mobility during ADLs: Supervision/safety       Vision   Vision Assessment?: No apparent visual deficits     Perception Perception: Not tested       Praxis Praxis: Not tested       Pertinent Vitals/Pain Pain Assessment Pain Assessment: Faces Pain Score: 4  Faces Pain Scale: Hurts little more Pain Location: L side of neck Pain Descriptors / Indicators: Discomfort Pain Intervention(s): Limited activity within patient's tolerance, Monitored during session, Repositioned     Extremity/Trunk Assessment Upper Extremity Assessment Upper Extremity Assessment: Generalized weakness;LUE deficits/detail LUE Deficits / Details: mild limitations in shoulder flexion compared to RUE, still [facility] FOR BADL   Lower Extremity Assessment Lower Extremity Assessment:  Defer to PT evaluation       Communication Communication Communication: Hearing impairment Cueing Techniques: Other (comments) (hearing aids not present at hospital)   Cognition Arousal: Alert Behavior During Therapy: [facility] for tasks  assessed/performed Overall Cognitive Status: Within Functional Limits for tasks assessed                                       General Comments  VSS    Exercises     Shoulder Instructions      Home Living Family/patient expects to be discharged to:: Private residence Living Arrangements: Alone Available Help at Discharge: Family;Available PRN/intermittently;Friend(s) (daughter & friends) Type of Home: Other(Comment) (condo) Home Access: Elevator     Home Layout: One level     Bathroom Shower/Tub: Walk-in shower         Home Equipment: Shower seat - built in;Grab bars - tub/shower;Cane - single Librarian, academic (2 wheels)          Prior Functioning/Environment Prior Level of Function : Independent/Modified Independent;Driving             Mobility Comments: no AD use ADLs Comments: ind, manages own meds        OT Problem List: Decreased range of motion;Pain      OT Treatment/Interventions: Self-care/ADL training;Therapeutic exercise;Energy conservation;DME and/or AE instruction;Therapeutic activities;Balance training;Patient/family education    OT Goals(Current goals can be found in the care plan section) Acute Rehab OT Goals Patient Stated Goal: none stated OT Goal Formulation: With patient Time For Goal Achievement: 01/28/24 Potential to Achieve Goals: Good ADL Goals Pt Will Perform Upper Body Dressing: Independently;sitting Pt Will Perform Lower Body Dressing: Independently;sit to/from stand;sitting/lateral leans Pt Will Transfer to Toilet: Independently;ambulating;regular height toilet Pt Will Perform Tub/Shower Transfer: Tub transfer;Shower transfer;Independently;ambulating  OT Frequency: Min 1X/week    Co-evaluation              AM-PAC OT "6 Clicks" Daily Activity     Outcome Measure Help from another person eating meals?: None Help from another person taking care of personal grooming?: None Help from another person  toileting, which includes using toliet, bedpan, or urinal?: None Help from another person bathing (including washing, rinsing, drying)?: A Little Help from another person to put on and taking off regular upper body clothing?: None Help from another person to put on and taking off regular lower body clothing?: A Little 6 Click Score: 22   End of Session Nurse Communication: Mobility status  Activity Tolerance: Patient tolerated treatment well Patient left: in bed;with call bell/phone within reach;with nursing/sitter in room  OT Visit Diagnosis: Unsteadiness on feet (R26.81);Other abnormalities of gait and mobility (R26.89);Muscle weakness (generalized) (M62.81)                Time: 5009-3818 OT Time Calculation (min): 23 min Charges:  OT General Charges $OT Visit: 1 Visit OT Evaluation $OT Eval Low Complexity: 1 Low OT Treatments $Self Care/Home Management : 8-22 mins  Carver Fila, OTD, OTR/L SecureChat Preferred Acute Rehab [phone]  Carver Fila Koonce 01/14/2024, 8:49 AM

## 2024-01-15 DIAGNOSIS — M4802 Spinal stenosis, cervical region: Secondary | ICD-10-CM | POA: Diagnosis not present

## 2024-01-15 NOTE — Plan of Care (Signed)
  Problem: Health Behavior/Discharge Planning: Goal: Ability to manage health-related needs will improve Outcome: Progressing   Problem: Clinical Measurements: Goal: Will remain free from infection Outcome: Progressing   Problem: Clinical Measurements: Goal: Diagnostic test results will improve Outcome: Progressing   Problem: Health Behavior/Discharge Planning: Goal: Ability to manage health-related needs will improve Outcome: Progressing   Problem: Clinical Measurements: Goal: Will remain free from infection Outcome: Progressing Goal: Diagnostic test results will improve Outcome: Progressing Goal: Respiratory complications will improve Outcome: Progressing Goal: Cardiovascular complication will be avoided Outcome: Progressing   Problem: Activity: Goal: Risk for activity intolerance will decrease Outcome: Progressing   Problem: Nutrition: Goal: Adequate nutrition will be maintained Outcome: Progressing   Problem: Coping: Goal: Level of anxiety will decrease Outcome: Progressing   Problem: Elimination: Goal: Will not experience complications related to bowel motility Outcome: Progressing Goal: Will not experience complications related to urinary retention Outcome: Progressing   Problem: Pain Managment: Goal: General experience of comfort will improve and/or be controlled Outcome: Progressing   Problem: Safety: Goal: Ability to remain free from injury will improve Outcome: Progressing   Problem: Skin Integrity: Goal: Risk for impaired skin integrity will decrease Outcome: Progressing

## 2024-01-15 NOTE — Progress Notes (Signed)
Patient admitted for treatment of neck pain.  Patient notes rather sudden severe neck pain with decreased range of motion beginning a few days ago.  Symptoms have now largely resolved.  Currently with minimal neck pain.  Denies radiating pain.  She has some chronic numbness and stiffness in both upper extremities but no frank weakness.  She has no bowel or bladder dysfunction.  She has no gait abnormalities.  Workup demonstrates evidence of severe multilevel cervical disc generation with associated severe cervical stenosis at C3-4 and C4-5.  Patient likely with symptoms secondary to resolving cervical strain.  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  Plan is for discharge home and follow-up as an outpatient.  We will discuss treatment of her stenosis at that time.  Patient will likely require anterior cervical decompression and fusion at C3-4 and C4-5.

## 2024-01-15 NOTE — Progress Notes (Signed)
Physical Therapy Treatment and Discharge Patient Details Name: Christine Scott MRN: [id number] DOB: March 06, 1941 Today's Date: 01/15/2024   History of Present Illness 83 y/o F presenting to ED on 1/17 with L neck pain, MRI C spine significant for worsenign severe stenosis and severe L neuroforaminal stenosis at C3-4 with mass effect on spinal cord. Neuro recommending following up outpatient for possibel ACDF. PMH includes HTN, osteoporosis, polymyalgia, rheumatica anxiety/depression, DDD    PT Comments  Continuing work on functional mobility and activity tolerance;  session focused on hallway ambulation and stairs, which Christine Scott performed well; slightly improved pain-free L  neck rotation today, compared to yesterday; Educated pt to refrain from pushing into pain at this point when turning her head, she verbalized understanding; Majority of acute PT goals met; will dc PT, with Mobility Specialist to follow up for daily ambulation; All further PT needs can be addressed at Outpt PT -- will defer to Dr. Jordan Likes and his team for Outpt PT referrals as appropriate; Acute PT signing off   If plan is discharge home, recommend the following: Assistance with cooking/housework   Can travel by private vehicle        Equipment Recommendations  None recommended by PT    Recommendations for Other Services       Precautions / Restrictions Precautions Precaution Comments: utilized cervical prec for comfort Restrictions Weight Bearing Restrictions Per Provider Order: No     Mobility  Bed Mobility                    Transfers Overall transfer level: Independent                      Ambulation/Gait Ambulation/Gait assistance: Supervision Gait Distance (Feet): 300 Feet Assistive device: None Gait Pattern/deviations: Step-through pattern       General Gait Details: No overt difficutly noted   Stairs Stairs: Yes Stairs assistance: Supervision Stair Management: Two rails,  Alternating pattern, Forwards Number of Stairs: 12 General stair comments: Took the steps slowly, no overt difficulty   Wheelchair Mobility     Tilt Bed    Modified Rankin (Stroke Patients Only)       Balance Overall balance assessment: No apparent balance deficits (not formally assessed)                                          Cognition Arousal: Alert Behavior During Therapy: WFL for tasks assessed/performed Overall Cognitive Status: Within Functional Limits for tasks assessed                                          Exercises      General Comments General comments (skin integrity, edema, etc.): HR 81, BP 129/97 standing after walking the hallways and stair negotiation      Pertinent Vitals/Pain Pain Assessment Pain Assessment: Faces Faces Pain Scale: Hurts a little bit Pain Location: L side of neck, with L rotation Pain Descriptors / Indicators: Discomfort Pain Intervention(s): Monitored during session    Home Living                          Prior Function            PT Goals (current  goals can now be found in the care plan section) Acute Rehab PT Goals Patient Stated Goal: to be able to get in and OOB without extra pain Progress towards PT goals: Goals met/education completed, patient discharged from PT (majority of goals met)    Frequency    Min 1X/week      PT Plan      Co-evaluation              AM-PAC PT "6 Clicks" Mobility   Outcome Measure  Help needed turning from your back to your side while in a flat bed without using bedrails?: A Little Help needed moving from lying on your back to sitting on the side of a flat bed without using bedrails?: A Little Help needed moving to and from a bed to a chair (including a wheelchair)?: None Help needed standing up from a chair using your arms (e.g., wheelchair or bedside chair)?: None Help needed to walk in hospital room?: None Help needed  climbing 3-5 steps with a railing? : A Little 6 Click Score: 21    End of Session   Activity Tolerance: Patient tolerated treatment well Patient left: in chair;with call bell/phone within reach Nurse Communication: Mobility status PT Visit Diagnosis: Other abnormalities of gait and mobility (R26.89);Pain Pain - Right/Left: Left Pain - part of body:  (neck pain)     Time: 2956-2130 PT Time Calculation (min) (ACUTE ONLY): 20 min  Charges:    $Gait Training: 8-22 mins PT General Charges $$ ACUTE PT VISIT: 1 Visit                     Van Clines, PT  Acute Rehabilitation Services Office [phone] Secure Chat welcomed'   Levi Aland 01/15/2024, 3:08 PM
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
## 2024-01-15 NOTE — TOC Initial Note (Addendum)
Transition of Care [facility]) - Initial/Assessment Note    Patient Details  Name: Christine Scott MRN: [id number] Date of Birth: February 08, 1941  Transition of Care [facility]) CM/SW Contact:    Kermit Balo, RN Phone Number: 01/15/2024, 2:24 PM  Clinical Narrative:                  Pt is from home alone but states her friend and daughter can check on her.  She will follow up after MD office visit for outpatient therapy. She denies issues with home medications.  Friend or daughter will provide needed transportation. TOC following.  Expected Discharge Plan: Home/Self Care Barriers to Discharge: Continued Medical Work up   Patient Goals and CMS Choice            Expected Discharge Plan and Services       Living arrangements for the past 2 months: Apartment                                      Prior Living Arrangements/Services Living arrangements for the past 2 months: Apartment Lives with:: Self Patient language and need for interpreter reviewed:: Yes Do you feel safe going back to the place where you live?: Yes          Current home services: DME (cane/ walker/ shower seat) Criminal Activity/Legal Involvement Pertinent to Current Situation/Hospitalization: No - Comment as needed  Activities of Daily Living   ADL Screening (condition at time of admission) Independently performs ADLs?: Yes (appropriate for developmental age) Is the patient deaf or have difficulty hearing?: No Does the patient have difficulty seeing, even when wearing glasses/contacts?: No Does the patient have difficulty concentrating, remembering, or making decisions?: No  Permission Sought/Granted                  Emotional Assessment Appearance:: Appears stated age Attitude/Demeanor/Rapport: Engaged Affect (typically observed): Accepting Orientation: : Oriented to Self, Oriented to Place, Oriented to  Time, Oriented to Situation   Psych Involvement: No (comment)  Admission diagnosis:   Neck pain [M54.2] Paresthesia [R20.2] Torticollis [M43.6] Foraminal stenosis of cervical region [M48.02] Occlusion of left vertebral artery [I65.02] Patient Active Problem List   Diagnosis Date Noted   Neck pain 01/13/2024   Spinal stenosis in cervical region 01/13/2024   Occlusion of left vertebral artery 01/13/2024   Pancytopenia (HCC) 01/13/2024   Depression 01/13/2024   Hyponatremia 01/13/2024   Alcohol dependence (HCC) 07/01/2022   S/P reverse total shoulder arthroplasty, 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 [place] [address] Phone: [phone] Fax: [phone]  [facility] - [place], [place] - [address] [place] [address] Phone: [phone] Fax: [phone]     Social Drivers of Health (SDOH) Social History: SDOH Screenings   Food Insecurity: No Food Insecurity (01/13/2024)  Housing: Low Risk  (01/13/2024)  Transportation Needs: No Transportation  Needs (01/13/2024)  Utilities: Not At Risk (01/13/2024)  Alcohol Screen: Medium Risk (05/11/2018)  Depression (PHQ2-9): Medium Risk (01/03/2024)  Financial Resource Strain: Low Risk  (10/13/2022)   Received from [facility]  Social Connections: Moderately Integrated (01/13/2024)  Tobacco Use: Low Risk  (01/12/2024)   SDOH Interventions:     Readmission Risk Interventions     No data to  display

## 2024-01-15 NOTE — Progress Notes (Signed)
Mobility Specialist Progress Note:    01/15/24 0900  Mobility  Activity Ambulated independently in hallway  Level of Assistance Standby assist, set-up cues, supervision of patient - no hands on  Assistive Device None  Distance Ambulated (ft) 300 ft  Activity Response Tolerated well  Mobility Referral Yes  Mobility visit 1 Mobility  Mobility Specialist Start Time (ACUTE ONLY) 0840  Mobility Specialist Stop Time (ACUTE ONLY) 0848  Mobility Specialist Time Calculation (min) (ACUTE ONLY) 8 min   Received pt in bed having no complaints and agreeable to mobility. Pt was asymptomatic throughout ambulation and returned to room w/o fault. Left in bed w/ call bell in reach and all needs met.   D'Vante Earlene Plater Mobility Specialist Please contact via Special educational needs teacher or Rehab office at [phone]

## 2024-01-16 ENCOUNTER — Encounter: Payer: Self-pay | Admitting: Family Medicine

## 2024-01-16 DIAGNOSIS — M4802 Spinal stenosis, cervical region: Secondary | ICD-10-CM | POA: Diagnosis not present

## 2024-01-16 MED ORDER — GABAPENTIN 100 MG PO CAPS: 100.0000 mg | ORAL_CAPSULE | Freq: Three times a day (TID) | ORAL | 0 refills | Status: DC

## 2024-01-16 NOTE — Progress Notes (Signed)
Occupational Therapy Treatment Patient Details Name: Christine Scott MRN: [id number] DOB: November 02, 1941 Today's Date: 01/16/2024   History of present illness 83 y/o F presenting to ED on 1/17 with L neck pain, MRI C spine significant for worsenign severe stenosis and severe L neuroforaminal stenosis at C3-4 with mass effect on spinal cord. Neuro recommending following up outpatient for possibel ACDF. PMH includes HTN, osteoporosis, polymyalgia, rheumatica anxiety/depression, DDD   OT comments  Pt seen for OT session, reiterated cervical precautions for comfort, pt with good recall from previous session. Pt able to perform LB ADL, toilet and shower transfers with supervision, pt ind with bed mobility and ambulating in room without assistance. Pt reports no concerns with ADLs/self care upon returning home. Pt presenting with impairments listed below, however has met OT goals and has no further acute OT needs. OT will s/o. Please reconsult if there is a change in pt status.       If plan is discharge home, recommend the following:  A little help with bathing/dressing/bathroom;Assistance with cooking/housework;Assist for transportation;Help with stairs or ramp for entrance   Equipment Recommendations  None recommended by OT    Recommendations for Other Services      Precautions / Restrictions Precautions Precaution Comments: utilized cervical prec for comfort Restrictions Weight Bearing Restrictions Per Provider Order: No       Mobility Bed Mobility Overal bed mobility: Independent                  Transfers   Equipment used: None Transfers: Sit to/from Stand Sit to Stand: Supervision                 Balance Overall balance assessment: No apparent balance deficits (not formally assessed)                                         ADL either performed or assessed with clinical judgement   ADL Overall ADL's : Needs assistance/impaired                      Lower Body Dressing: Sitting/lateral leans;Independent Lower Body Dressing Details (indicate cue type and reason): figure 4 Toilet Transfer: Ambulation;Regular Toilet;Independent Toilet Transfer Details (indicate cue type and reason): simulated via functional mobility     Tub/ Shower Transfer: Walk-in shower;Ambulation;Shower seat;Independent          Extremity/Trunk Assessment Upper Extremity Assessment LUE Deficits / Details: mild limitations in shoulder flexion compared to RUE, still [facility] FOR BADL   Lower Extremity Assessment Lower Extremity Assessment: Defer to PT evaluation        Vision   Vision Assessment?: No apparent visual deficits   Perception Perception Perception: Not tested   Praxis Praxis Praxis: Not tested    Cognition Arousal: Alert Behavior During Therapy: WFL for tasks assessed/performed Overall Cognitive Status: Within Functional Limits for tasks assessed                                          Exercises      Shoulder Instructions       General Comments VSS    Pertinent Vitals/ Pain       Pain Assessment Pain Assessment: Faces Pain Score: 2  Faces Pain Scale: Hurts a little bit Pain Location: L  side of neck, with L rotation Pain Descriptors / Indicators: Discomfort Pain Intervention(s): Limited activity within patient's tolerance, Monitored during session, Repositioned  Home Living                                          Prior Functioning/Environment              Frequency  Min 1X/week        Progress Toward Goals  OT Goals(current goals can now be found in the care plan section)  Progress towards OT goals: Goals met/education completed, patient discharged from OT  Acute Rehab OT Goals Patient Stated Goal: none stated OT Goal Formulation: With patient Time For Goal Achievement: 01/28/24 Potential to Achieve Goals: Good ADL Goals Pt Will Perform Upper Body Dressing:  Independently;sitting Pt Will Perform Lower Body Dressing: Independently;sit to/from stand;sitting/lateral leans Pt Will Transfer to Toilet: Independently;ambulating;regular height toilet Pt Will Perform Tub/Shower Transfer: Tub transfer;Shower transfer;Independently;ambulating  Plan      Co-evaluation                 AM-PAC OT "6 Clicks" Daily Activity     Outcome Measure   Help from another person eating meals?: None Help from another person taking care of personal grooming?: None Help from another person toileting, which includes using toliet, bedpan, or urinal?: None Help from another person bathing (including washing, rinsing, drying)?: None Help from another person to put on and taking off regular upper body clothing?: None Help from another person to put on and taking off regular lower body clothing?: None 6 Click Score: 24    End of Session    OT Visit Diagnosis: Unsteadiness on feet (R26.81);Other abnormalities of gait and mobility (R26.89);Muscle weakness (generalized) (M62.81)   Activity Tolerance Patient tolerated treatment well   Patient Left in bed;with call bell/phone within reach   Nurse Communication Mobility status        Time: 6578-4696 OT Time Calculation (min): 18 min  Charges: OT General Charges $OT Visit: 1 Visit OT Treatments $Self Care/Home Management : 8-22 mins  Carver Fila, OTD, OTR/L SecureChat Preferred Acute Rehab [phone]   Wafaa Deemer K Koonce 01/16/2024, 10:14 AM
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
## 2024-01-16 NOTE — Progress Notes (Signed)
Mobility Specialist Progress Note:    01/16/24 0900  Mobility  Activity Ambulated independently in hallway  Level of Assistance Standby assist, set-up cues, supervision of patient - no hands on  Assistive Device None  Distance Ambulated (ft) 300 ft  Activity Response Tolerated well  Mobility Referral Yes  Mobility visit 1 Mobility  Mobility Specialist Start Time (ACUTE ONLY) [id number]  Mobility Specialist Stop Time (ACUTE ONLY) [id number]  Mobility Specialist Time Calculation (min) (ACUTE ONLY) 10 min   Received pt in bed having no complaints and agreeable to mobility. Pt was asymptomatic throughout ambulation and returned to room w/o fault. Left in bed w/ call bell in reach and all needs met.   D'Vante Earlene Plater Mobility Specialist Please contact via Special educational needs teacher or Rehab office at [phone]

## 2024-01-16 NOTE — Care Management Important Message (Signed)
Important Message  Patient Details  Name: Christine Scott MRN: [id number] Date of Birth: [birth year]   Important Message Given:     Patient left prior to IM delivery will mail a copy to the patient home address.    Melea Prezioso 01/16/2024, 3:21 PM

## 2024-01-17 ENCOUNTER — Telehealth: Payer: Self-pay

## 2024-01-17 NOTE — Transitions of Care (Post Inpatient/ED Visit) (Signed)
01/17/2024  Name: Christine Scott MRN: [id number] DOB: [birth year]  Today's TOC FU Call Status: Today's TOC FU Call Status:: Successful TOC FU Call Completed TOC FU Call Complete Date: 01/17/24 Patient's Name and Date of Birth confirmed.  Transition Care Management Follow-up Telephone Call Date of Discharge: 01/16/24 Discharge Facility: [facility]) Type of Discharge: Inpatient Admission Primary Inpatient Discharge Diagnosis:: spinal stenosis How have you been since you were released from the hospital?: Same Any questions or concerns?: No  Items Reviewed: Did you receive and understand the discharge instructions provided?: Yes Medications obtained,verified, and reconciled?: Yes (Medications Reviewed) Any new allergies since your discharge?: No Dietary orders reviewed?: Yes Do you have support at home?: No  Medications Reviewed Today: Medications Reviewed Today     Reviewed by Karena Addison, LPN (Licensed Practical Nurse) on 01/17/24 at 0913  Med List Status: <None>   Medication Order Taking? Sig Documenting Provider Last Dose Status Informant  acetaminophen (TYLENOL) 500 MG tablet [id number] No Take 1,000 mg by mouth at bedtime. [provider] 01/12/2024 Noon Active Self  amLODipine (NORVASC) 10 MG tablet [id number] No Take 1 tablet (10 mg total) by mouth daily. [name], MD 01/12/2024 Morning Active Self  B Complex-Biotin-FA (B-COMPLEX PO) [id number] No Take 1 tablet by mouth daily. Super [provider] 01/11/2024 Active Self  Calcium Carb-Cholecalciferol (CALCIUM CARBONATE-VITAMIN D3) 600-400 MG-UNIT TABS [id number] No Take 1 tablet by mouth daily.  [provider] 01/11/2024 Active Self           Med Note Kimber Relic Mar 30, 2020  3:40 PM)    conjugated estrogens (PREMARIN) vaginal cream [id number] No Place 1/2 gram per vagina at bedtime two to three times weekly. Patton Salles, MD 01/11/2024 Active Self  cycloSPORINE  (RESTASIS) 0.05 % ophthalmic emulsion [id number] No SMARTSIG:In Eye(s) [provider] Past Week Active Self  denosumab (PROLIA) 60 MG/ML SOSY injection [id number] No Inject 60 mg into the skin every 6 (six) months. [provider] Taking Active Self  gabapentin (NEURONTIN) 100 MG capsule [id number]  Take 1 capsule (100 mg total) by mouth 3 (three) times daily. Willeen Niece, MD  Active   lamoTRIgine (LAMICTAL) 150 MG tablet [id number] No TAKE 1 TABLET BY MOUTH DAILY Cottle, Steva Ready., MD 01/12/2024 Morning Active Self  losartan (COZAAR) 25 MG tablet [id number] No Take 1 tablet (25 mg total) by mouth daily. Alver Sorrow, NP 01/12/2024 Morning Active Self  Multiple Vitamins-Minerals (MULTIVITAMIN PO) [id number] No Take 1 tablet by mouth daily. Centrum silver [provider] 01/11/2024 Active Self           Med Note Karoline Caldwell, AMY W   Tue Aug 15, 2017  8:23 AM)    Omega-3 Fatty Acids (FISH OIL) 1000 MG CAPS [id number] No Take 1,000 mg by mouth daily.  [provider] 01/11/2024 Active Self           Med Note Karoline Caldwell, AMY W   Tue Aug 15, 2017  8:23 AM)    ondansetron (ZOFRAN) 4 MG tablet [id number] No Take 4 mg by mouth every 8 (eight) hours as needed for nausea. [provider] 01/12/2024 Active Self  Polyethyl Glycol-Propyl Glycol (SYSTANE ULTRA OP) [id number] No Place 1 drop into both eyes in the morning and at bedtime.  [provider] 01/12/2024 Morning Active Self  White Petrolatum-Mineral Oil (SYSTANE NIGHTTIME) OINT [id number] No Place 1 application into both eyes at bedtime as needed (Dry eye).  [provider] 01/12/2024 Morning Active Self            Home Care and Equipment/Supplies: Were Home Health Services Ordered?: NA Any new equipment or medical supplies ordered?: NA  Functional Questionnaire: Do you need assistance with bathing/showering or dressing?: No Do you need assistance with meal preparation?: No Do you need  assistance with eating?: No Do you have difficulty maintaining continence: No Do you need assistance with getting out of bed/getting out of a chair/moving?: No Do you have difficulty managing or taking your medications?: No  Follow up appointments reviewed: PCP Follow-up appointment confirmed?: No (declined appt) MD Provider Line Number:[phone] Given: No Specialist Hospital Follow-up appointment confirmed?: No Reason Specialist Follow-Up Not Confirmed: Patient has Specialist Provider Number and will Call for Appointment Do you need transportation to your follow-up appointment?: No Do you understand care options if your condition(s) worsen?: Yes-patient verbalized understanding    SIGNATURE Karena Addison, LPN [facility] Nurse Health Advisor Direct Dial [phone]

## 2024-01-22 ENCOUNTER — Encounter: Payer: Self-pay | Admitting: *Deleted

## 2024-01-24 ENCOUNTER — Other Ambulatory Visit: Payer: Self-pay | Admitting: Neurosurgery

## 2024-01-26 ENCOUNTER — Encounter (HOSPITAL_COMMUNITY): Payer: Self-pay

## 2024-01-26 NOTE — Progress Notes (Signed)
Surgical Instructions    Your procedure is scheduled on Tuesday, 01/30/24.  Report to [facility] Main Entrance "A" at 10 A.M., then check in with the Admitting office.  Call this number if you have problems the morning of surgery:  [phone]   If you have any questions prior to your surgery date call [phone]: Open Monday-Friday 8am-4pm If you experience any cold or flu symptoms such as cough, fever, chills, shortness of breath, etc. between now and your scheduled surgery, please notify us at the above number     Remember:  Do not eat or drink after midnight the night before your surgery-Monday     Take these medicines the morning of surgery with A SIP OF WATER:  amLODipine (NORVASC)  cycloSPORINE (RESTASIS) eye drops gabapentin (NEURONTIN)  lamoTRIgine (LAMICTAL)    As of today, STOP taking any Aspirin (unless otherwise instructed by your surgeon) Aleve, Naproxen, Ibuprofen, Motrin, Advil, Goody's, BC's, all herbal medications, fish oil, and all vitamins.           Do not wear jewelry or makeup. Do not wear lotions, powders, perfumes or deodorant. Do not shave 48 hours prior to surgery.   Do not bring valuables to the hospital. Do not wear nail polish, gel polish, artificial nails, or any other type of covering on natural nails (fingers and toes) If you have artificial nails or gel coating that need to be removed by a nail salon, please have this removed prior to surgery. Artificial nails or gel coating may interfere with anesthesia's ability to adequately monitor your vital signs.  [facility] is not responsible for any belongings or valuables.     Contacts, glasses, hearing aids, dentures or partials may not be worn into surgery, please bring cases for these belongings   For patients admitted to the hospital, discharge time will be determined by your treatment team.    SURGICAL WAITING ROOM VISITATION Patients having surgery or a procedure may have no more than 2  support people in the waiting area - these visitors may rotate.   Children under the age of 14 must have an adult with them who is not the patient. If the patient needs to stay at the hospital during part of their recovery, the visitor guidelines for inpatient rooms apply. Pre-op nurse will coordinate an appropriate time for 1 support person to accompany patient in pre-op.  This support person may not rotate.   Please refer to [email or web address] for the visitor guidelines for Inpatients (after your surgery is over and you are in a regular room).    Special instructions:    Oral Hygiene is also important to reduce your risk of infection.  Remember - BRUSH YOUR TEETH THE MORNING OF SURGERY WITH YOUR REGULAR TOOTHPASTE   [facility]- Preparing For Surgery  Before surgery, you can play an important role. Because skin is not sterile, your skin needs to be as free of germs as possible. You can reduce the number of germs on your skin by washing with CHG (chlorahexidine gluconate) Soap before surgery.  CHG is an antiseptic cleaner which kills germs and bonds with the skin to continue killing germs even after washing.     Please do not use if you have an allergy to CHG or antibacterial soaps. If your skin becomes reddened/irritated stop using the CHG.  Do not shave (including legs and underarms) for at least 48 hours prior to first CHG shower. It is OK to shave your face.  Please follow  these instructions carefully.     Shower the [employer] SURG[facility]-Mon and the MORNING OF SURGERY-Tues with CHG Soap.   If you chose to wash your hair, wash your hair first as usual with your normal shampoo. After you shampoo, rinse your hair and body thoroughly to remove the shampoo.  Then [employer] and genitals (private parts) with your normal soap and rinse thoroughly to remove soap.  After that Use CHG Soap as you would any other liquid soap. You can apply CHG  directly to the skin and wash gently with a scrungie or a clean washcloth.   Apply the CHG Soap to your body ONLY FROM THE NECK DOWN.  Do not use on open wounds or open sores. Avoid contact with your eyes, ears, mouth and genitals (private parts). Wash Face and genitals (private parts)  with your normal soap.   Wash thoroughly, paying special attention to the area where your surgery will be performed.  Thoroughly rinse your body with warm water from the neck down.  DO NOT shower/wash with your normal soap after using and rinsing off the CHG Soap.  Pat yourself dry with a CLEAN TOWEL.  Wear CLEAN PAJAMAS to bed the night before surgery  Place CLEAN SHEETS on your bed the night before your surgery  DO NOT SLEEP WITH PETS.   Day of Surgery:  Take a shower with CHG soap. Wear Clean/Comfortable clothing the morning of surgery Do not apply any deodorants/lotions.   Remember to brush your teeth WITH YOUR REGULAR TOOTHPASTE.    If you received a COVID test during your pre-op visit, it is requested that you wear a mask when out in public, stay away from anyone that may not be feeling well, and notify your surgeon if you develop symptoms. If you have been in contact with anyone that has tested positive in the last 10 days, please notify your surgeon.    Please read over the following fact sheets that you were given.

## 2024-01-26 NOTE — Progress Notes (Incomplete)
PCP - Dr [name] Cardiologist - Dr Jodelle [facility] - Dr Meredith Staggers Rhe[facility]ology - Dr Pollyann Savoy  Chest x-ray - n/a EKG - 01/13/24 Stress Test - 09/29/21 ECHO - 12/09/20 Cardiac Cath - n/a  ICD Pacemaker/Loop - n/a  Sleep Study -  n/a  Diabetes - n/a  Aspirin and Blood Thinner Instructions:  n/a  NPO   Anesthesia review: no  STOP now taking any Aspirin (unless otherwise instructed by your surgeon), Aleve, Naproxen, Ibuprofen, Motrin, Advil, Goody's, BC's, all herbal medications, fish oil, and all vitamins.   Coronavirus Screening Do you have any of the following symptoms:  Cough yes/no: No Fever (>100.15F)  yes/no: No Runny nose yes/no: No Sore throat yes/no: No Difficulty breathing/shortness of breath  yes/no: No  Have you traveled in the last 14 days and where? yes/no: No  Patient verbalized understanding of instructions that were given to them at the PAT appointment. Patient was also instructed that they will need to review over the PAT instructions again at home before surgery.

## 2024-01-29 ENCOUNTER — Encounter (HOSPITAL_COMMUNITY)
Admission: RE | Admit: 2024-01-29 | Discharge: 2024-01-29 | Disposition: A | Discharge status: Home / Self Care | Payer: Federal, State, Local not specified - PPO | Source: Ambulatory Visit | Attending: Neurosurgery | Admitting: Neurosurgery

## 2024-01-29 ENCOUNTER — Encounter (HOSPITAL_COMMUNITY): Payer: Self-pay

## 2024-01-29 ENCOUNTER — Other Ambulatory Visit: Payer: Self-pay

## 2024-01-29 VITALS — BP 151/72 | HR 77 | Temp 98.3°F | Resp 18 | Ht 60.0 in | Wt 125.1 lb

## 2024-01-29 DIAGNOSIS — Z01812 Encounter for preprocedural laboratory examination: Secondary | ICD-10-CM | POA: Diagnosis not present

## 2024-01-29 DIAGNOSIS — Z01818 Encounter for other preprocedural examination: Secondary | ICD-10-CM

## 2024-01-29 HISTORY — DX: Unspecified hearing loss, unspecified ear: H91.90

## 2024-01-29 HISTORY — DX: Other psychoactive substance abuse, uncomplicated: F19.10

## 2024-01-29 HISTORY — DX: Dorsalgia, unspecified: M54.9

## 2024-01-29 HISTORY — DX: Bipolar disorder, unspecified: F31.9

## 2024-01-29 LAB — CBC
HCT: 35.6 % — ABNORMAL LOW (ref 36.0–46.0)
Hemoglobin: 11.3 g/dL — ABNORMAL LOW (ref 12.0–15.0)
MCH: 29.7 pg (ref 26.0–34.0)
MCHC: 31.7 g/dL (ref 30.0–36.0)
MCV: 93.4 fL (ref 80.0–100.0)
Platelets: 209 10*3/uL (ref 150–400)
RBC: 3.81 MIL/uL — ABNORMAL LOW (ref 3.87–5.11)
RDW: 14.5 % (ref 11.5–15.5)
WBC: 4.5 10*3/uL (ref 4.0–10.5)
nRBC: 0 % (ref 0.0–0.2)

## 2024-01-29 LAB — COMPREHENSIVE METABOLIC PANEL
ALT: 15 U/L (ref 0–44)
AST: 23 U/L (ref 15–41)
Albumin: 3.3 g/dL — ABNORMAL LOW (ref 3.5–5.0)
Alkaline Phosphatase: 55 U/L (ref 38–126)
Anion gap: 11 (ref 5–15)
BUN: 14 mg/dL (ref 8–23)
CO2: 27 mmol/L (ref 22–32)
Calcium: 9.2 mg/dL (ref 8.9–10.3)
Chloride: 101 mmol/L (ref 98–111)
Creatinine, Ser: 0.85 mg/dL (ref 0.44–1.00)
GFR, Estimated: >60 mL/min (ref >60)
Glucose, Bld: 97 mg/dL (ref 70–99)
Potassium: 4.1 mmol/L (ref 3.5–5.1)
Sodium: 139 mmol/L (ref 135–145)
Total Bilirubin: 0.6 mg/dL (ref 0.0–1.2)
Total Protein: 6.9 g/dL (ref 6.5–8.1)

## 2024-01-29 LAB — SURGICAL PCR SCREEN
MRSA, PCR: NEGATIVE
Staphylococcus aureus: NEGATIVE

## 2024-01-30 ENCOUNTER — Encounter (HOSPITAL_COMMUNITY)
Admission: RE | Disposition: A | Discharge status: Home / Self Care | Payer: Self-pay | Source: Home / Self Care | Attending: Neurosurgery

## 2024-01-30 ENCOUNTER — Ambulatory Visit (HOSPITAL_COMMUNITY): Payer: Federal, State, Local not specified - PPO

## 2024-01-30 ENCOUNTER — Other Ambulatory Visit: Payer: Self-pay

## 2024-01-30 ENCOUNTER — Ambulatory Visit (HOSPITAL_COMMUNITY): Payer: Federal, State, Local not specified - PPO | Admitting: Anesthesiology

## 2024-01-30 ENCOUNTER — Observation Stay (HOSPITAL_COMMUNITY)
Admission: RE | Admit: 2024-01-30 | Discharge: 2024-02-01 | Disposition: A | Discharge status: Home / Self Care | Payer: Federal, State, Local not specified - PPO | Attending: Neurosurgery | Admitting: Neurosurgery

## 2024-01-30 ENCOUNTER — Encounter (HOSPITAL_COMMUNITY): Payer: Self-pay | Admitting: Neurosurgery

## 2024-01-30 DIAGNOSIS — G992 Myelopathy in diseases classified elsewhere: Principal | ICD-10-CM | POA: Diagnosis present

## 2024-01-30 DIAGNOSIS — M5001 Cervical disc disorder with myelopathy,  high cervical region: Secondary | ICD-10-CM | POA: Diagnosis not present

## 2024-01-30 DIAGNOSIS — Z96612 Presence of left artificial shoulder joint: Secondary | ICD-10-CM | POA: Insufficient documentation

## 2024-01-30 DIAGNOSIS — M4802 Spinal stenosis, cervical region: Secondary | ICD-10-CM | POA: Diagnosis present

## 2024-01-30 DIAGNOSIS — I10 Essential (primary) hypertension: Secondary | ICD-10-CM | POA: Insufficient documentation

## 2024-01-30 DIAGNOSIS — Z79899 Other long term (current) drug therapy: Secondary | ICD-10-CM | POA: Insufficient documentation

## 2024-01-30 DIAGNOSIS — M50021 Cervical disc disorder at C4-C5 level with myelopathy: Secondary | ICD-10-CM | POA: Insufficient documentation

## 2024-01-30 HISTORY — PX: ANTERIOR CERVICAL DECOMP/DISCECTOMY FUSION: SHX1161

## 2024-01-30 SURGERY — ANTERIOR CERVICAL DECOMPRESSION/DISCECTOMY FUSION 2 LEVELS
Anesthesia: General

## 2024-01-30 MED ORDER — PROPOFOL 10 MG/ML IV BOLUS
INTRAVENOUS | Status: DC | PRN
  Administered 2024-01-30: 100 mg via INTRAVENOUS

## 2024-01-30 MED ORDER — ONDANSETRON HCL 4 MG/2ML IJ SOLN: 4.0000 mg | Freq: Once | INTRAMUSCULAR | Status: DC | PRN

## 2024-01-30 MED ORDER — FENTANYL CITRATE (PF) 100 MCG/2ML IJ SOLN
INTRAMUSCULAR | Status: AC
  Filled 2024-01-30: qty 2

## 2024-01-30 MED ORDER — ROCURONIUM BROMIDE 10 MG/ML (PF) SYRINGE
PREFILLED_SYRINGE | INTRAVENOUS | Status: DC | PRN
  Administered 2024-01-30: 30 mg via INTRAVENOUS
  Administered 2024-01-30: 50 mg via INTRAVENOUS

## 2024-01-30 MED ORDER — ONDANSETRON HCL 4 MG/2ML IJ SOLN: 4.0000 mg | Freq: Four times a day (QID) | INTRAMUSCULAR | Status: DC | PRN

## 2024-01-30 MED ORDER — SUGAMMADEX SODIUM 200 MG/2ML IV SOLN
INTRAVENOUS | Status: DC | PRN
  Administered 2024-01-30: 200 mg via INTRAVENOUS

## 2024-01-30 MED ORDER — DEXAMETHASONE SODIUM PHOSPHATE 10 MG/ML IJ SOLN
INTRAMUSCULAR | Status: DC | PRN
  Administered 2024-01-30: 10 mg via INTRAVENOUS

## 2024-01-30 MED ORDER — CYCLOBENZAPRINE HCL 10 MG PO TABS
10.0000 mg | ORAL_TABLET | Freq: Three times a day (TID) | ORAL | Status: DC | PRN
  Administered 2024-01-30 – 2024-02-01 (×4): 10 mg via ORAL
  Filled 2024-01-30 (×3): qty 1

## 2024-01-30 MED ORDER — LAMOTRIGINE 100 MG PO TABS
150.0000 mg | ORAL_TABLET | Freq: Every day | ORAL | Status: DC
Start: 2024-01-31 — End: 2024-02-01
  Administered 2024-01-31: 150 mg via ORAL
  Filled 2024-01-30: qty 2

## 2024-01-30 MED ORDER — HYDROCODONE-ACETAMINOPHEN 5-325 MG PO TABS
1.0000 | ORAL_TABLET | ORAL | Status: DC | PRN
  Administered 2024-01-30 – 2024-01-31 (×3): 1 via ORAL
  Administered 2024-01-31 (×2): 2 via ORAL
  Administered 2024-02-01: 1 via ORAL
  Filled 2024-01-30: qty 1
  Filled 2024-01-30 (×2): qty 2
  Filled 2024-01-30: qty 1
  Filled 2024-01-30: qty 2
  Filled 2024-01-30: qty 1

## 2024-01-30 MED ORDER — GABAPENTIN 100 MG PO CAPS
100.0000 mg | ORAL_CAPSULE | Freq: Three times a day (TID) | ORAL | Status: DC
  Administered 2024-01-30 – 2024-02-01 (×6): 100 mg via ORAL
  Filled 2024-01-30 (×6): qty 1

## 2024-01-30 MED ORDER — ADULT MULTIVITAMIN LIQUID CH
15.0000 mL | Freq: Every day | ORAL | Status: DC
Start: 2024-01-31 — End: 2024-02-01
  Administered 2024-01-31: 15 mL via ORAL
  Filled 2024-01-30 (×2): qty 15

## 2024-01-30 MED ORDER — HYDROMORPHONE HCL 1 MG/ML IJ SOLN: 1.0000 mg | INTRAMUSCULAR | Status: DC | PRN

## 2024-01-30 MED ORDER — LIDOCAINE 2% (20 MG/ML) 5 ML SYRINGE
INTRAMUSCULAR | Status: DC | PRN
  Administered 2024-01-30: 60 mg via INTRAVENOUS

## 2024-01-30 MED ORDER — CHLORHEXIDINE GLUCONATE CLOTH 2 % EX PADS: 6.0000 | MEDICATED_PAD | Freq: Once | CUTANEOUS | Status: DC

## 2024-01-30 MED ORDER — HYDROCODONE-ACETAMINOPHEN 10-325 MG PO TABS: 2.0000 | ORAL_TABLET | ORAL | Status: DC | PRN

## 2024-01-30 MED ORDER — ONDANSETRON HCL 4 MG/2ML IJ SOLN
INTRAMUSCULAR | Status: DC | PRN
  Administered 2024-01-30: 4 mg via INTRAVENOUS

## 2024-01-30 MED ORDER — PHENYLEPHRINE HCL-NACL 20-0.9 MG/250ML-% IV SOLN
INTRAVENOUS | Status: DC | PRN
  Administered 2024-01-30: 25 ug/min via INTRAVENOUS

## 2024-01-30 MED ORDER — AMISULPRIDE (ANTIEMETIC) 5 MG/2ML IV SOLN: 10.0000 mg | Freq: Once | INTRAVENOUS | Status: DC | PRN

## 2024-01-30 MED ORDER — DOCUSATE SODIUM 100 MG PO CAPS
100.0000 mg | ORAL_CAPSULE | Freq: Two times a day (BID) | ORAL | Status: DC | PRN
  Administered 2024-01-30: 100 mg via ORAL
  Filled 2024-01-30: qty 1

## 2024-01-30 MED ORDER — SENNA 8.6 MG PO TABS
1.0000 | ORAL_TABLET | Freq: Two times a day (BID) | ORAL | Status: DC | PRN
  Administered 2024-01-30: 8.6 mg via ORAL
  Filled 2024-01-30: qty 1

## 2024-01-30 MED ORDER — CYCLOSPORINE 0.05 % OP EMUL
1.0000 [drp] | Freq: Two times a day (BID) | OPHTHALMIC | Status: DC
  Administered 2024-01-30 – 2024-01-31 (×3): 1 [drp] via OPHTHALMIC
  Filled 2024-01-30 (×4): qty 30

## 2024-01-30 MED ORDER — ACETAMINOPHEN 325 MG PO TABS: 650.0000 mg | ORAL_TABLET | ORAL | Status: DC | PRN

## 2024-01-30 MED ORDER — CEFAZOLIN SODIUM-DEXTROSE 1-4 GM/50ML-% IV SOLN
1.0000 g | Freq: Three times a day (TID) | INTRAVENOUS | Status: AC
  Administered 2024-01-30 – 2024-01-31 (×2): 1 g via INTRAVENOUS
  Filled 2024-01-30 (×2): qty 50

## 2024-01-30 MED ORDER — AMLODIPINE BESYLATE 10 MG PO TABS
10.0000 mg | ORAL_TABLET | Freq: Every day | ORAL | Status: DC
  Administered 2024-01-31: 10 mg via ORAL
  Filled 2024-01-30: qty 1

## 2024-01-30 MED ORDER — ORAL CARE MOUTH RINSE: 15.0000 mL | Freq: Once | OROMUCOSAL | Status: AC

## 2024-01-30 MED ORDER — OMEGA-3-ACID ETHYL ESTERS 1 G PO CAPS
1.0000 g | ORAL_CAPSULE | Freq: Every day | ORAL | Status: DC
  Filled 2024-01-30: qty 1

## 2024-01-30 MED ORDER — PHENOL 1.4 % MT LIQD
1.0000 | OROMUCOSAL | Status: DC | PRN
  Filled 2024-01-30: qty 177

## 2024-01-30 MED ORDER — FENTANYL CITRATE (PF) 100 MCG/2ML IJ SOLN
25.0000 ug | INTRAMUSCULAR | Status: DC | PRN
  Administered 2024-01-30: 50 ug via INTRAVENOUS
  Administered 2024-01-30 (×4): 25 ug via INTRAVENOUS

## 2024-01-30 MED ORDER — ACETAMINOPHEN 500 MG PO TABS
1000.0000 mg | ORAL_TABLET | Freq: Once | ORAL | Status: AC
  Administered 2024-01-30: 1000 mg via ORAL
  Filled 2024-01-30: qty 2

## 2024-01-30 MED ORDER — MULTIVITAMIN PO LIQD: Freq: Every day | ORAL | Status: DC

## 2024-01-30 MED ORDER — HYDROMORPHONE HCL 1 MG/ML IJ SOLN
INTRAMUSCULAR | Status: DC | PRN
  Administered 2024-01-30 (×2): .25 mg via INTRAVENOUS

## 2024-01-30 MED ORDER — ACETAMINOPHEN 650 MG RE SUPP: 650.0000 mg | RECTAL | Status: DC | PRN

## 2024-01-30 MED ORDER — CHLORHEXIDINE GLUCONATE 0.12 % MT SOLN
15.0000 mL | Freq: Once | OROMUCOSAL | Status: AC
  Administered 2024-01-30: 15 mL via OROMUCOSAL
  Filled 2024-01-30: qty 15

## 2024-01-30 MED ORDER — THROMBIN 20000 UNITS EX SOLR
CUTANEOUS | Status: DC | PRN
  Administered 2024-01-30: 20 mL via TOPICAL

## 2024-01-30 MED ORDER — CALCIUM CARB-CHOLECALCIFEROL 600-5 MG-MCG PO TABS: ORAL_TABLET | Freq: Every day | ORAL | Status: DC

## 2024-01-30 MED ORDER — FENTANYL CITRATE (PF) 250 MCG/5ML IJ SOLN
INTRAMUSCULAR | Status: DC | PRN
  Administered 2024-01-30 (×3): 50 ug via INTRAVENOUS

## 2024-01-30 MED ORDER — LACTATED RINGERS IV SOLN: INTRAVENOUS | Status: DC

## 2024-01-30 MED ORDER — SODIUM CHLORIDE 0.9 % IV SOLN: 250.0000 mL | INTRAVENOUS | Status: AC

## 2024-01-30 MED ORDER — HYDROMORPHONE HCL 1 MG/ML IJ SOLN
INTRAMUSCULAR | Status: AC
  Filled 2024-01-30: qty 0.5

## 2024-01-30 MED ORDER — OXYCODONE HCL 5 MG/5ML PO SOLN: 5.0000 mg | Freq: Once | ORAL | Status: AC | PRN

## 2024-01-30 MED ORDER — FENTANYL CITRATE (PF) 250 MCG/5ML IJ SOLN
INTRAMUSCULAR | Status: AC
  Filled 2024-01-30: qty 5

## 2024-01-30 MED ORDER — ONDANSETRON HCL 4 MG PO TABS: 4.0000 mg | ORAL_TABLET | Freq: Four times a day (QID) | ORAL | Status: DC | PRN

## 2024-01-30 MED ORDER — CEFAZOLIN SODIUM-DEXTROSE 2-4 GM/100ML-% IV SOLN
2.0000 g | INTRAVENOUS | Status: AC
  Administered 2024-01-30: 2 g via INTRAVENOUS
  Filled 2024-01-30: qty 100

## 2024-01-30 MED ORDER — PROPOFOL 10 MG/ML IV BOLUS
INTRAVENOUS | Status: AC
  Filled 2024-01-30: qty 20

## 2024-01-30 MED ORDER — MENTHOL 3 MG MT LOZG
1.0000 | LOZENGE | OROMUCOSAL | Status: DC | PRN
  Administered 2024-02-01: 3 mg via ORAL
  Filled 2024-01-30: qty 9

## 2024-01-30 MED ORDER — OXYCODONE HCL 5 MG PO TABS
5.0000 mg | ORAL_TABLET | Freq: Once | ORAL | Status: AC | PRN
  Administered 2024-01-30: 5 mg via ORAL

## 2024-01-30 MED ORDER — LOSARTAN POTASSIUM 25 MG PO TABS
25.0000 mg | ORAL_TABLET | Freq: Every day | ORAL | Status: DC
Start: 2024-01-31 — End: 2024-02-01
  Administered 2024-01-31: 25 mg via ORAL
  Filled 2024-01-30: qty 1

## 2024-01-30 MED ORDER — SODIUM CHLORIDE 0.9% FLUSH
3.0000 mL | Freq: Two times a day (BID) | INTRAVENOUS | Status: DC
  Administered 2024-01-30: 3 mL via INTRAVENOUS

## 2024-01-30 MED ORDER — SODIUM CHLORIDE 0.9% FLUSH: 3.0000 mL | INTRAVENOUS | Status: DC | PRN

## 2024-01-30 MED ORDER — CYCLOBENZAPRINE HCL 10 MG PO TABS
ORAL_TABLET | ORAL | Status: AC
  Filled 2024-01-30: qty 1

## 2024-01-30 MED ORDER — OYSTER SHELL CALCIUM/D3 500-5 MG-MCG PO TABS
1.0000 | ORAL_TABLET | Freq: Every day | ORAL | Status: DC
Start: 2024-01-31 — End: 2024-02-01
  Administered 2024-01-31 – 2024-02-01 (×2): 1 via ORAL
  Filled 2024-01-30 (×2): qty 1

## 2024-01-30 MED ORDER — OXYCODONE HCL 5 MG PO TABS
ORAL_TABLET | ORAL | Status: AC
  Filled 2024-01-30: qty 1

## 2024-01-30 MED ORDER — HYDROCODONE-ACETAMINOPHEN 5-325 MG PO TABS
1.0000 | ORAL_TABLET | ORAL | Status: DC | PRN
  Filled 2024-01-30: qty 1

## 2024-01-30 MED ORDER — THROMBIN 20000 UNITS EX SOLR
CUTANEOUS | Status: AC
  Filled 2024-01-30: qty 20000

## 2024-01-30 MED ORDER — PHENYLEPHRINE 80 MCG/ML (10ML) SYRINGE FOR IV PUSH (FOR BLOOD PRESSURE SUPPORT)
PREFILLED_SYRINGE | INTRAVENOUS | Status: DC | PRN
  Administered 2024-01-30: 160 ug via INTRAVENOUS

## 2024-01-30 MED ORDER — ARTIFICIAL TEARS OPHTHALMIC OINT
TOPICAL_OINTMENT | OPHTHALMIC | Status: AC
  Filled 2024-01-30: qty 3.5

## 2024-01-30 MED ORDER — 0.9 % SODIUM CHLORIDE (POUR BTL) OPTIME
TOPICAL | Status: DC | PRN
  Administered 2024-01-30: 1000 mL

## 2024-01-30 SURGICAL SUPPLY — 45 items
BAG COUNTER SPONGE SURGICOUNT (BAG) ×2 IMPLANT
BAND RUBBER #18 3X1/16 STRL (MISCELLANEOUS) ×4 IMPLANT
BENZOIN TINCTURE PRP APPL 2/3 (GAUZE/BANDAGES/DRESSINGS) ×2 IMPLANT
BIT DRILL 13 (BIT) IMPLANT
BUR MATCHSTICK NEURO 3.0 LAGG (BURR) ×2 IMPLANT
CANISTER SUCT 3000ML PPV (MISCELLANEOUS) ×2 IMPLANT
DRAPE C-ARM 42X72 X-RAY (DRAPES) ×4 IMPLANT
DRAPE LAPAROTOMY 100X72 PEDS (DRAPES) ×2 IMPLANT
DRAPE MICROSCOPE SLANT 54X150 (MISCELLANEOUS) ×2 IMPLANT
DURAPREP 6ML APPLICATOR 50/CS (WOUND CARE) ×2 IMPLANT
ELECT COATED BLADE 2.86 ST (ELECTRODE) ×2 IMPLANT
ELECT REM PT RETURN 9FT ADLT (ELECTROSURGICAL) ×1
ELECTRODE REM PT RTRN 9FT ADLT (ELECTROSURGICAL) ×2 IMPLANT
GAUZE 4X4 16PLY ~~LOC~~+RFID DBL (SPONGE) IMPLANT
GAUZE SPONGE 4X4 12PLY STRL (GAUZE/BANDAGES/DRESSINGS) ×2 IMPLANT
GLOVE ECLIPSE 9.0 STRL (GLOVE) ×2 IMPLANT
GLOVE EXAM NITRILE XL STR (GLOVE) IMPLANT
GOWN STRL REUS W/ TWL LRG LVL3 (GOWN DISPOSABLE) IMPLANT
GOWN STRL REUS W/ TWL XL LVL3 (GOWN DISPOSABLE) ×2 IMPLANT
GOWN STRL REUS W/TWL 2XL LVL3 (GOWN DISPOSABLE) IMPLANT
HALTER HD/CHIN CERV TRACTION D (MISCELLANEOUS) ×2 IMPLANT
HEMOSTAT POWDER KIT SURGIFOAM (HEMOSTASIS) IMPLANT
HEMOSTAT SURGICEL 2X14 (HEMOSTASIS) IMPLANT
KIT BASIN OR (CUSTOM PROCEDURE TRAY) ×2 IMPLANT
KIT TURNOVER KIT B (KITS) ×2 IMPLANT
NDL SPNL 20GX3.5 QUINCKE YW (NEEDLE) ×2 IMPLANT
NEEDLE SPNL 20GX3.5 QUINCKE YW (NEEDLE) ×1
NS IRRIG 1000ML POUR BTL (IV SOLUTION) ×2 IMPLANT
PACK LAMINECTOMY NEURO (CUSTOM PROCEDURE TRAY) ×2 IMPLANT
PAD ARMBOARD 7.5X6 YLW CONV (MISCELLANEOUS) ×6 IMPLANT
PLATE VISION ELITE 40MM (Plate) IMPLANT
SCREW ST FIX 4 ATL 3120213 (Screw) IMPLANT
SPACER SPNL 11X14X6XPEEK CVD (Cage) IMPLANT
SPCR SPNL 11X14X6XPEEK CVD (Cage) ×2 IMPLANT
SPONGE INTESTINAL PEANUT (DISPOSABLE) ×2 IMPLANT
SPONGE SURGIFOAM ABS GEL SZ50 (HEMOSTASIS) ×2 IMPLANT
STRIP CLOSURE SKIN 1/2X4 (GAUZE/BANDAGES/DRESSINGS) ×2 IMPLANT
SUT VIC AB 3-0 SH 8-18 (SUTURE) ×2 IMPLANT
SUT VIC AB 4-0 RB1 18 (SUTURE) ×2 IMPLANT
TAPE CLOTH 4X10 WHT NS (GAUZE/BANDAGES/DRESSINGS) ×2 IMPLANT
TAPE CLOTH SURG 4X10 WHT LF (GAUZE/BANDAGES/DRESSINGS) IMPLANT
TOWEL GREEN STERILE (TOWEL DISPOSABLE) ×2 IMPLANT
TOWEL GREEN STERILE FF (TOWEL DISPOSABLE) ×2 IMPLANT
TRAP SPECIMEN MUCUS 40CC (MISCELLANEOUS) ×2 IMPLANT
WATER STERILE IRR 1000ML POUR (IV SOLUTION) ×2 IMPLANT

## 2024-01-30 NOTE — H&P (Signed)
 Christine Scott is an 83 y.o. female.   Chief Complaint: Neck pain HPI: 83 year old female with history of neck pain and bilateral upper extremity numbness paresthesias and progressive weakness with gait instability.  Workup demonstrates evidence of marked multilevel cervical disc generation with degenerative anterolisthesis and kyphotic angulation at C3-4 and C4-5 with severe spinal stenosis and ongoing cord compression.  Patient presents now for two-level anterior cervical decompression and fusion in hopes of improving her symptoms.  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 Trial of PT/injection. If doesn't do well good candidate for reverse shoulder arthoplasty. Continue with PT (12/19). No specific f/u was scheduled.    Osteoarthritis    hands   Osteoporosis    Polymyalgia rheumatica (HCC)     Past Surgical History:  Procedure Laterality Date   BREAST SURGERY  2016   benign mass removed in [place], [place].   COLONOSCOPY     FACIAL COSMETIC SURGERY     REVERSE SHOULDER ARTHROPLASTY Left 04/16/2020   Procedure: REVERSE SHOULDER ARTHROPLASTY;  Surgeon: Melita Drivers, MD;  Location: [facility];  Service: Orthopedics;  Laterality: Left;    TOTAL VAGINAL HYSTERECTOMY  age 51   --due to abnormal pap smears per patient    Family History  Problem Relation Age of Onset   Congestive Heart Failure Mother     Stroke Mother    Diabetes Father    Colon cancer Brother    Diabetes Brother    Diabetes Brother    Stroke Brother    Heart attack Brother    Colon cancer Daughter    BRCA 1/2 Neg Hx    Breast cancer Neg Hx    Social History:  reports that she has never smoked. She has never been exposed to tobacco smoke. She has never used smokeless tobacco. She reports current alcohol  use of about 2.0 standard drinks of alcohol  per week. She reports that she does not use drugs.  Allergies: No Known Allergies  Medications Prior to Admission  Medication Sig Dispense Refill   acetaminophen  (TYLENOL ) 500 MG tablet Take 1,000 mg by mouth at bedtime.     amLODipine  (NORVASC ) 10 MG tablet Take 1 tablet (10 mg total) by mouth daily. 90 tablet 1   B Complex-C (SUPER B COMPLEX PO) Take 1 tablet by mouth daily.     Calcium  Carb-Cholecalciferol  (CALCIUM  600 + D PO) Take 1 tablet by mouth daily.     conjugated estrogens  (PREMARIN ) vaginal cream Place 1/2 gram per vagina at bedtime two to three times weekly. 30 g 1   cycloSPORINE  (RESTASIS ) 0.05 % ophthalmic emulsion Place 1 drop into both eyes 2 (two) times daily.     gabapentin  (NEURONTIN ) 100 MG capsule Take 1 capsule (100 mg total) by  mouth 3 (three) times daily. 90 capsule 0   lamoTRIgine  (LAMICTAL ) 150 MG tablet TAKE 1 TABLET BY MOUTH DAILY 30 tablet 1   losartan  (COZAAR ) 25 MG tablet Take 1 tablet (25 mg total) by mouth daily. 90 tablet 3   Multiple Vitamins-Minerals (MULTIVITAMIN PO) Take 1 tablet by mouth daily. Centrum silver     Omega-3 Fatty Acids (FISH OIL) 1000 MG CAPS Take 1,000 mg by mouth daily.      denosumab  (PROLIA ) 60 MG/ML SOSY injection Inject 60 mg into the skin every 6 (six) months.      Results for orders placed or performed during the hospital encounter of 01/29/24 (from the past 48 hours)  Surgical pcr screen     Status: None   Collection Time: 01/29/24  1:36 PM   Specimen: Nasal Mucosa; Nasal Swab  Result Value Ref Range   MRSA,  PCR NEGATIVE NEGATIVE   Staphylococcus aureus NEGATIVE NEGATIVE    Comment: (NOTE) The Xpert SA Assay (FDA approved for NASAL specimens in patients 50 years of age and older), is one component of a comprehensive surveillance program. It is not intended to diagnose infection nor to guide or monitor treatment. Performed at [facility], [address] [place], [place] [address]   CBC     Status: Abnormal   Collection Time: 01/29/24  2:00 PM  Result Value Ref Range   WBC 4.5 4.0 - 10.5 K/uL   RBC 3.81 (L) 3.87 - 5.11 MIL/uL   Hemoglobin 11.3 (L) 12.0 - 15.0 g/dL   HCT 64.3 (L) 63.9 - 53.9 %   MCV 93.4 80.0 - 100.0 fL   MCH 29.7 26.0 - 34.0 pg   MCHC 31.7 30.0 - 36.0 g/dL   RDW 85.4 88.4 - 84.4 %   Platelets 209 150 - 400 K/uL   nRBC 0.0 0.0 - 0.2 %    Comment: Performed at [facility], [address] [place], [place] [address]  Comprehensive metabolic panel     Status: Abnormal   Collection Time: 01/29/24  2:00 PM  Result Value Ref Range   Sodium 139 135 - 145 mmol/L   Potassium 4.1 3.5 - 5.1 mmol/L   Chloride 101 98 - 111 mmol/L   CO2 27 22 - 32 mmol/L   Glucose, Bld 97 70 - 99 mg/dL    Comment: Glucose reference range applies only to samples taken after fasting for at least 8 hours.   BUN 14 8 - 23 mg/dL   Creatinine, Ser 9.14 0.44 - 1.00 mg/dL   Calcium  9.2 8.9 - 10.3 mg/dL   Total Protein 6.9 6.5 - 8.1 g/dL   Albumin 3.3 (L) 3.5 - 5.0 g/dL   AST 23 15 - 41 U/L   ALT 15 0 - 44 U/L   Alkaline Phosphatase 55 38 - 126 U/L   Total Bilirubin 0.6 0.0 - 1.2 mg/dL   GFR, Estimated >39 >39 mL/min    Comment: (NOTE) Calculated using the CKD-EPI Creatinine Equation (2021)    Anion gap 11 5 - 15    Comment: Performed at [facility], [address], [place], [place] [address]   No results found.  Pertinent items noted in HPI and remainder of comprehensive ROS otherwise negative.  Blood pressure (!) 144/67, pulse 75, temperature 97.7 F (36.5 C),  temperature source Oral, resp. rate 18, height 5' (1.524 m), weight 53.5 kg, SpO2 96%.  Patient is awake and alert.  She is oriented  and appropriate.  Speech is fluent.  Judgment insight are intact.  Cranial nerve function normal bilateral.  Motor examination reveals some mild weakness of grips and intrinsic strength of the wise motor strength intact.  Sensory examination with patchy distal sensory loss in both upper extremities.  Reflexes are brisk.  She has Hoffmann's responses in both hands.  Gait is somewhat unsteady.  Examination head ears eyes nose and throat is unremarkable.  Chest and abdomen are benign.  Extremities are free of major deformity. Assessment/Plan Cervical stenosis with myelopathy.  Plan a C3-4 C4-5 anterior cervical discectomy with interbody fusion utilizing interbody cages, THAT autograft, and anterior placed rotation.  Risks and benefits been explained.  Patient wishes proceed.  [facility] Nayab Aten 01/30/2024, 11:32 AM

## 2024-01-30 NOTE — Op Note (Signed)
 Date of procedure: 01/30/2024  Date of dictation: Same  Service: Neurosurgery  Preoperative diagnosis: Cervical stenosis with myelopathy  Postoperative diagnosis: Same  Procedure Name: C3-4, C4-5 anterior cervical discectomy with interbody fusion utilizing interbody cages, low curves and autograft, and anterior plate instrumentation  Surgeon:Myrene Bougher A.Mirza Fessel, M.D.  Asst. Surgeon: Colon, MD  Anesthesia: General  Indication: 83 year old female with neck pain and bilateral upper extremity numbness paresthesias and some weakness with gait instability.  Workup demonstrates evidence of marked cervical disc generation with degenerative anterolisthesis and severe stenosis with ongoing cord compression at C3-4 and C4-5.  Patient presents now for two-level anterior cervical discectomy and fusion in hopes improving her symptoms.  Operative note: After induction of anesthesia, patient positioned supine with neck slightly extended and held placeholder traction.  Patient's anterior cervical region prepped and draped sterilely.  Incision made overlying C4.  Dissection performed in the right.  Retractor placed.  Fluoroscopy used.  Levels confirmed.  Disc bases at C3-4 and C4-5 incised.  Discectomies performed at both levels utilizing various instruments to remove the disc down to level of the posterior annulus.  Microscope then brought to the field used throughout the remainder of the discectomy.  Remaining aspects of annulus and osteophytes were removed using the high-speed drill down to the level of the posterior logical ligament.  Posterior logical was then elevated and resected in a piecemeal fashion.  Underlying thecal sac was identified.  A wide central decompression of the performed undercutting the bodies of C3 and C4.  Decompression proceeded each neural foramina.  Wide anterior foraminotomies performed on the course exiting C4 nerve roots bilaterally.  At this point a very thorough decompression been  achieved.  Procedure was then repeated at C4-5 in a similar fashion again without complications.  Wound was then irrigated.  Gelfoam was placed topically for hemostasis then removed.  6 mm [employer] anatomic peek cages were packed with locally harvested autograft.  Each cage was then impacted in place and recessed slightly from the anterior cortical margins.  Atlantis anterior cervical plate was then placed over the C3, C4 and C5 levels.  This then attached under fluoroscopic guidance using 13 mm fixed angle screws to each at all 3 levels.  All screws given final tightening found to be solidly within the bones.  Locking screws engaged both levels.  Final images reveal good position of the cages and the hardware at the proper level with normal alignment of the spine.  Wound was then irrigated.  Hemostasis was assured.  Wound is then closed in layers with Vicryl sutures.  Steri-Strips and sterile dressing were applied.  No apparent complications.  Patient tolerated the procedure well and she returns to the recovery room postop.

## 2024-01-30 NOTE — Anesthesia Postprocedure Evaluation (Signed)
 Anesthesia Post Note  Patient: Christine Scott  Procedure(s) Performed: ANTERIOR CERVICAL DECOMPRESSION AND FUSION CERVICAL THREE-CERVICAL FOUR/CERVICAL FOUR-CERVICAL FIVE     Patient location during evaluation: PACU Anesthesia Type: General Level of consciousness: awake and alert, oriented and patient cooperative Pain management: pain level controlled Vital Signs Assessment: post-procedure vital signs reviewed and stable Respiratory status: spontaneous breathing, nonlabored ventilation and respiratory function stable Cardiovascular status: blood pressure returned to baseline and stable Postop Assessment: no apparent nausea or vomiting Anesthetic complications: no   No notable events documented.  Last Vitals:  Vitals:   01/30/24 1430 01/30/24 1445  BP: 122/61 132/61  Pulse: 77 74  Resp: 13 15  Temp:    SpO2: 94% 95%    Last Pain:  Vitals:   01/30/24 1445  TempSrc:   PainSc: 8     LLE Motor Response: Purposeful movement (01/30/24 1445) LLE Sensation: Full sensation (01/30/24 1445) RLE Motor Response: Purposeful movement (01/30/24 1445) RLE Sensation: Full sensation (01/30/24 1445)      Christine Scott

## 2024-01-30 NOTE — Anesthesia Preprocedure Evaluation (Addendum)
Anesthesia Evaluation  Patient identified by MRN, date of birth, ID band Patient awake    Reviewed: Allergy & Precautions, H&P , NPO status , Patient's Chart, lab work & pertinent test results  Airway Mallampati: II  TM Distance: >3 FB Neck ROM: Full    Dental  (+) Teeth Intact, Dental Advisory Given   Pulmonary neg pulmonary ROS   Pulmonary exam normal breath sounds clear to auscultation       Cardiovascular hypertension (151/72 preop, per pt normally 140s SBP), Pt. on medications Normal cardiovascular exam Rhythm:Regular Rate:Normal     Neuro/Psych  PSYCHIATRIC DISORDERS Anxiety Depression Bipolar Disorder   negative neurological ROS     GI/Hepatic negative GI ROS,,,(+)     substance abuse  alcohol use  Endo/Other  negative endocrine ROS    Renal/GU negative Renal ROS  negative genitourinary   Musculoskeletal  (+) Arthritis , Osteoarthritis and Rheumatoid disorders,    Abdominal   Peds negative pediatric ROS (+)  Hematology  (+) Blood dyscrasia, anemia Hb 11.3, plt 209   Anesthesia Other Findings   Reproductive/Obstetrics negative OB ROS                             Anesthesia Physical Anesthesia Plan  ASA: 2  Anesthesia Plan: General   Post-op Pain Management: Tylenol PO (pre-op)*   Induction: Intravenous  PONV Risk Score and Plan: 3 and Ondansetron, Dexamethasone and Treatment may vary due to age or medical condition  Airway Management Planned: Oral ETT and Video Laryngoscope Planned  Additional Equipment: None  Intra-op Plan:   Post-operative Plan: Extubation in OR  Informed Consent: I have reviewed the patients History and Physical, chart, labs and discussed the procedure including the risks, benefits and alternatives for the proposed anesthesia with the patient or authorized representative who has indicated his/her understanding and acceptance.     Dental advisory  given  Plan Discussed with: CRNA  Anesthesia Plan Comments:        Anesthesia Quick Evaluation

## 2024-01-30 NOTE — Transfer of Care (Signed)
 Immediate Anesthesia Transfer of Care Note  Patient: Christine Scott  Procedure(s) Performed: ANTERIOR CERVICAL DECOMPRESSION AND FUSION CERVICAL THREE-CERVICAL FOUR/CERVICAL FOUR-CERVICAL FIVE  Patient Location: PACU  Anesthesia Type:General  Level of Consciousness: awake, alert , and oriented  Airway & Oxygen Therapy: Patient Spontanous Breathing  Post-op Assessment: Report given to RN  Post vital signs: Reviewed and stable  Last Vitals:  Vitals Value Taken Time  BP 138/56 01/30/24 1416  Temp    Pulse 79 01/30/24 1421  Resp 15 01/30/24 1421  SpO2 96 % 01/30/24 1421  Vitals shown include unfiled device data.  Last Pain:  Vitals:   01/30/24 1047  TempSrc:   PainSc: 0-No pain         Complications: No notable events documented.

## 2024-01-30 NOTE — Brief Op Note (Signed)
 01/30/2024  2:01 PM  PATIENT:  Christine Scott  83 y.o. female  PRE-OPERATIVE DIAGNOSIS:  Stenosis  POST-OPERATIVE DIAGNOSIS:  Stenosis  PROCEDURE:  Procedure(s) with comments: ANTERIOR CERVICAL DECOMPRESSION AND FUSION CERVICAL THREE-CERVICAL FOUR/CERVICAL FOUR-CERVICAL FIVE (N/A) - 3C  SURGEON:  Surgeons and Role:    * Louis Shove, MD - Primary    DEWAINE Colon Shove, MD - Assisting  PHYSICIAN ASSISTANT:   ASSISTANTS:    ANESTHESIA:   general  EBL:  200 mL   BLOOD ADMINISTERED:none  DRAINS: none   LOCAL MEDICATIONS USED:  NONE  SPECIMEN:  No Specimen  DISPOSITION OF SPECIMEN:  N/A  COUNTS:  YES  TOURNIQUET:  * No tourniquets in log *  DICTATION: .Dragon Dictation  PLAN OF CARE: Admit for overnight observation  PATIENT DISPOSITION:  PACU - hemodynamically stable.   Delay start of Pharmacological VTE agent (>24hrs) due to surgical blood loss or risk of bleeding: yes

## 2024-01-30 NOTE — Progress Notes (Signed)
 Orthopedic Tech Progress Note Patient Details:  Christine Scott 09-24-1941 [id number]  Ortho Devices Type of Ortho Device: Soft collar Ortho Device/Splint Location: Neck Ortho Device/Splint Interventions: Ordered, Application   Post Interventions Patient Tolerated: Well  [name] A [name] 01/30/2024, 2:57 PM

## 2024-01-31 ENCOUNTER — Encounter (HOSPITAL_COMMUNITY): Payer: Self-pay | Admitting: Neurosurgery

## 2024-01-31 ENCOUNTER — Other Ambulatory Visit: Payer: Self-pay | Admitting: Rheumatology

## 2024-01-31 DIAGNOSIS — M4802 Spinal stenosis, cervical region: Secondary | ICD-10-CM | POA: Diagnosis not present

## 2024-01-31 NOTE — Progress Notes (Signed)
 Postop day 1.  Patient complains of anterior neck pain.  No radiating pain numbness or weakness.  Patient has been able to stand and ambulate without difficulty.  Upper and lower extremities feel better.  She is awake and alert.  Oriented and appropriate.  Motor and sensory function are intact.  Her wound is clean and dry.  Neck is soft.  Chest and abdomen benign.  Progressing well following two-level anterior cervical decompression and fusion surgery.  Plan for 1 more day of hospitalization for better mobilization and pain control.  Plan for discharge tomorrow.

## 2024-01-31 NOTE — Evaluation (Signed)
 Occupational Therapy Evaluation Patient Details Name: Christine Scott MRN: [id number] DOB: [birth year] Today's Date: 01/31/2024   History of Present Illness 83 year old female with neck pain and bilateral upper extremity numbness paresthesias and some weakness with gait instability.  Workup demonstrates evidence of marked cervical disc generation with degenerative anterolisthesis and severe stenosis with ongoing cord compression at C3-4 and C4-5. Pt underwent an anterior cervical decompression and fusion C3-C4/C4-C5 on 01/30/24. PMH: bipolar, depression, HTN, L reverse shoulder arthroplasty, ETOH.   Clinical Impression   Patient is s/p cervical surgery resulting in functional limitations due to the deficits listed below (see OT Problem List). Pt and friend educated on cervical precautions, brace wearing schedule and management, compensatory techniques during ADL completion, and pain management techniques. Handout provided for reference. All education completed and questions addressed during evaluation. Pt to follow up with MD at post op appointment. No further acute OT needs. Will sign off.        If plan is discharge home, recommend the following: Assistance with cooking/housework;Assist for transportation    Functional Status Assessment  Patient has had a recent decline in their functional status and demonstrates the ability to make significant improvements in function in a reasonable and predictable amount of time.  Equipment Recommendations  None recommended by OT       Precautions / Restrictions Precautions Precautions: Cervical Precaution Booklet Issued: Yes (comment) Precaution Comments: verbal education provided on cervical precautions, cervical collar wearing schedule, compensatory techniques for ADL tasks. Handout provided for reference. Required Braces or Orthoses: Cervical Brace Cervical Brace: Soft collar;At all times;Other (comment) (Ok to remove in bed, in shower, and if ambulating  to/from bathroom) Restrictions Weight Bearing Restrictions Per Provider Order: No      Mobility Bed Mobility Overal bed mobility: Needs Assistance Bed Mobility: Rolling, Sidelying to Sit, Sit to Sidelying Rolling: Contact guard assist Sidelying to sit: Contact guard assist     Sit to sidelying: Contact guard assist General bed mobility comments: Pt educated on log roll technique and following cervical precautions. Required VC and tactile cues to complete technique correctly. HOB slightly elevated due to pt's tolerance level. Discussed modifications to bed for home or use of recliner for sleep.    Transfers Overall transfer level: Needs assistance Equipment used: None Transfers: Sit to/from Stand, Bed to chair/wheelchair/BSC Sit to Stand: Modified independent (Device/Increase time)     Step pivot transfers: Modified independent (Device/Increase time)     General transfer comment: slow pace and lightly used wall railing for comfort. Pt reports fear of falling.      Balance Overall balance assessment: History of Falls, No apparent balance deficits (not formally assessed)      ADL either performed or assessed with clinical judgement   ADL      General ADL Comments: SBA/set-up needed for ADL completion due to recent neck surgery and cervical precautions. Pt able to demonstrate independence with cervical precautions and asked questions when appropriate.     Vision Baseline Vision/History: 0 No visual deficits Ability to See in Adequate Light: 0 Adequate Patient Visual Report: No change from baseline Vision Assessment?: No apparent visual deficits     Perception Perception: Not tested       Praxis Praxis: Not tested       Pertinent Vitals/Pain Pain Assessment Pain Assessment: Faces Faces Pain Scale: Hurts whole lot Pain Location: cervical sugrical site Pain Descriptors / Indicators: Discomfort, Grimacing Pain Intervention(s): Limited activity within patient's  tolerance, Monitored during session, Ice applied  Extremity/Trunk Assessment Upper Extremity Assessment Upper Extremity Assessment: Right hand dominant;Overall [facility] for tasks assessed   Lower Extremity Assessment Lower Extremity Assessment: Overall WFL for tasks assessed   Cervical / Trunk Assessment Cervical / Trunk Assessment: Neck Surgery   Communication Communication Communication: Hearing impairment   Cognition Arousal: Alert Behavior During Therapy: WFL for tasks assessed/performed Overall Cognitive Status: Within Functional Limits for tasks assessed      General Comments  VSS on RA. soft cervical collar on during session.            Home Living Family/patient expects to be discharged to:: Private residence Living Arrangements: Alone Available Help at Discharge: Family;Available PRN/intermittently;Friend(s) Type of Home: Other(Comment) (condo) Home Access: Elevator     Home Layout: One level     Bathroom Shower/Tub: Walk-in shower         Home Equipment: Shower seat - built in;Grab bars - tub/shower;Cane - single Librarian, Academic (2 wheels);Hand held shower head          Prior Functioning/Environment Prior Level of Function : Independent/Modified Independent;History of Falls (last six months)           OT Problem List: Pain;Decreased range of motion         OT Goals(Current goals can be found in the care plan section) Acute Rehab OT Goals Patient Stated Goal: to stay one more night OT Goal Formulation: All assessment and education complete, DC therapy  OT Frequency:  1X visit       AM-PAC OT 6 Clicks Daily Activity     Outcome Measure Help from another person eating meals?: None Help from another person taking care of personal grooming?: A Little Help from another person toileting, which includes using toliet, bedpan, or urinal?: A Little Help from another person bathing (including washing, rinsing, drying)?: A Little Help from  another person to put on and taking off regular upper body clothing?: A Little Help from another person to put on and taking off regular lower body clothing?: A Little 6 Click Score: 19   End of Session Equipment Utilized During Treatment: Cervical collar  Activity Tolerance: Patient tolerated treatment well Patient left: in bed;with call bell/phone within reach;with family/visitor present  OT Visit Diagnosis: Pain Pain - Right/Left:  (anterior) Pain - part of body:  (cervical region)                Time: 0821-0904 OT Time Calculation (min): 43 min Charges:  OT General Charges $OT Visit: 1 Visit OT Evaluation $OT Eval High Complexity: 1 High OT Treatments $Self Care/Home Management : 8-22 mins $Therapeutic Activity: 8-22 mins  Leita Howell, OTR/L,CBIS  Supplemental OT - [facility] and [facility] Secure Chat Preferred    Jasmarie Coppock, Leita BIRCH 01/31/2024, 9:14 AM

## 2024-02-01 DIAGNOSIS — M4802 Spinal stenosis, cervical region: Secondary | ICD-10-CM | POA: Diagnosis not present

## 2024-02-01 MED ORDER — CYCLOBENZAPRINE HCL 10 MG PO TABS: 10.0000 mg | ORAL_TABLET | Freq: Three times a day (TID) | ORAL | 0 refills | Status: DC | PRN

## 2024-02-01 MED ORDER — HYDROCODONE-ACETAMINOPHEN 5-325 MG PO TABS: 1.0000 | ORAL_TABLET | ORAL | 0 refills | Status: DC | PRN

## 2024-02-01 NOTE — Progress Notes (Signed)
 Patient alert and oriented, void and ambulate. Surgical site clean and dry no sign of infections. D/c instructions explain and given all questions answered. Patient d/c home per order.

## 2024-02-01 NOTE — Discharge Instructions (Addendum)
  Wound Care Keep incision covered and dry until post op day 3. You may remove the Honeycomb dressing on post op day 3. Leave steri-strips on neck.  They will fall off by themselves. Do not put any creams, lotions, or ointments on incision. You are fine to shower. Let water  run over incision and pat dry.  Activity Walk each and every day, increasing distance each day. No lifting greater than 8 lbs.  Avoid excessive neck motion. No driving, or riding a car unless coming back and forth to see the doctor  Diet Resume your normal diet.    Call Your Doctor If Any of These Occur Redness, drainage, or swelling at the wound.  Temperature greater than 101 degrees. Severe pain not relieved by pain medication. Incision starts to come apart.  Follow Up Appt Call [phone] if you have one or any problem.

## 2024-02-01 NOTE — Discharge Summary (Signed)
 Physician Discharge Summary  Patient ID: ISABELLY KOBLER MRN: [id number] DOB/AGE: [birth year] 83 y.o.  Admit date: 01/30/2024 Discharge date: 02/01/2024  Admission Diagnoses:  Discharge Diagnoses:  Principal Problem:   Stenosis of cervical spine with myelopathy [facility])   Discharged Condition: good  Hospital Course: Patient admitted to the hospital where she underwent uncomplicated two-level anterior cervical decompression for cervical myelopathy.  Postop doing well.  Minimal neck pain.  No upper extremity symptoms.  Standing ambulating and voiding well.  Ready for discharge home.  Consults:   Significant Diagnostic Studies:   Treatments:   Discharge Exam: Blood pressure (!) 125/59, pulse 91, temperature 97.6 F (36.4 C), resp. rate 16, height 5' (1.524 m), weight 53.5 kg, SpO2 96%. Awake and alert.  Oriented and appropriate.  Motor and sensory function intact.  Wound clean and dry.  Neck soft.  Voice strong.  Chest and abdomen benign.  Disposition: Discharge disposition: 01-Home or Self Care        Allergies as of 02/01/2024   No Known Allergies      Medication List     TAKE these medications    acetaminophen  500 MG tablet Commonly known as: TYLENOL  Take 1,000 mg by mouth at bedtime.   amLODipine  10 MG tablet Commonly known as: NORVASC  Take 1 tablet (10 mg total) by mouth daily.   CALCIUM  600 + D PO Take 1 tablet by mouth daily.   cyclobenzaprine  10 MG tablet Commonly known as: FLEXERIL  Take 1 tablet (10 mg total) by mouth 3 (three) times daily as needed for muscle spasms.   cycloSPORINE  0.05 % ophthalmic emulsion Commonly known as: RESTASIS  Place 1 drop into both eyes 2 (two) times daily.   denosumab  60 MG/ML Sosy injection Commonly known as: PROLIA  Inject 60 mg into the skin every 6 (six) months.   Fish Oil 1000 MG Caps Take 1,000 mg by mouth daily.   gabapentin  100 MG capsule Commonly known as: NEURONTIN  Take 1 capsule (100 mg total) by mouth 3  (three) times daily.   HYDROcodone -acetaminophen  5-325 MG tablet Commonly known as: NORCO/VICODIN Take 1-2 tablets by mouth every 4 (four) hours as needed for moderate pain (pain score 4-6) or severe pain (pain score 7-10) ((score 4 to 6)).   lamoTRIgine  150 MG tablet Commonly known as: LAMICTAL  TAKE 1 TABLET BY MOUTH DAILY   losartan  25 MG tablet Commonly known as: COZAAR  Take 1 tablet (25 mg total) by mouth daily.   MULTIVITAMIN PO Take 1 tablet by mouth daily. Centrum silver   Premarin  vaginal cream Generic drug: conjugated estrogens  Place 1/2 gram per vagina at bedtime two to three times weekly.   SUPER B COMPLEX PO Take 1 tablet by mouth daily.         Signed: [facility] [employer] Stevan Eberwein 02/01/2024, 8:20 AM

## 2024-02-06 ENCOUNTER — Telehealth: Payer: Self-pay

## 2024-02-06 DIAGNOSIS — Z79899 Other long term (current) drug therapy: Secondary | ICD-10-CM

## 2024-02-06 NOTE — Telephone Encounter (Signed)
-----   Message from [facility] sent at 02/06/2024  3:45 PM EST ----- Please have patient come in after 1 week for repeat CBC with differential.

## 2024-02-06 NOTE — Progress Notes (Signed)
Please have patient come in after 1 week for repeat CBC with differential.

## 2024-02-12 ENCOUNTER — Other Ambulatory Visit: Payer: Self-pay | Admitting: *Deleted

## 2024-02-12 DIAGNOSIS — Z79899 Other long term (current) drug therapy: Secondary | ICD-10-CM

## 2024-02-12 LAB — CBC WITH DIFFERENTIAL/PLATELET
Absolute Lymphocytes: 950 {cells}/uL (ref 850–3900)
Absolute Monocytes: 418 {cells}/uL (ref 200–950)
Basophils Absolute: 29 {cells}/uL (ref 0–200)
Basophils Relative: 0.6 %
Eosinophils Absolute: 355 {cells}/uL (ref 15–500)
Eosinophils Relative: 7.4 %
HCT: 30.1 % — ABNORMAL LOW (ref 35.0–45.0)
Hemoglobin: 9.9 g/dL — ABNORMAL LOW (ref 11.7–15.5)
MCH: 29.9 pg (ref 27.0–33.0)
MCHC: 32.9 g/dL (ref 32.0–36.0)
MCV: 90.9 fL (ref 80.0–100.0)
MPV: 10.2 fL (ref 7.5–12.5)
Monocytes Relative: 8.7 %
Neutro Abs: 3048 {cells}/uL (ref 1500–7800)
Neutrophils Relative %: 63.5 %
Platelets: 317 10*3/uL (ref 140–400)
RBC: 3.31 10*6/uL — ABNORMAL LOW (ref 3.80–5.10)
RDW: 12.6 % (ref 11.0–15.0)
Total Lymphocyte: 19.8 %
WBC: 4.8 10*3/uL (ref 3.8–10.8)

## 2024-02-13 ENCOUNTER — Telehealth: Payer: Self-pay | Admitting: *Deleted

## 2024-02-13 NOTE — Progress Notes (Signed)
Hemoglobin is low.  Please notify patient and forward results to her PCP for evaluation of anemia.

## 2024-02-13 NOTE — Telephone Encounter (Signed)
Patient contacted the office and left message requesting a prescription for her low hemoglobin.  Returned call to patient to advise she would need to reach out to her PCP for treatment of anemia. Patient advised we have faxed over a copy of her lab work. Patient expressed understanding.

## 2024-02-14 ENCOUNTER — Other Ambulatory Visit: Payer: Self-pay

## 2024-02-14 DIAGNOSIS — D61818 Other pancytopenia: Secondary | ICD-10-CM

## 2024-02-19 ENCOUNTER — Ambulatory Visit: Payer: Self-pay | Admitting: Family Medicine

## 2024-02-19 NOTE — Telephone Encounter (Signed)
 Chief Complaint: Dizziness, blurry vision Symptoms: see above Frequency: since recent hospital stay Pertinent Negatives: Patient denies weakness/numbness of arms or legs Disposition: [] ED /[] Urgent Care (no appt availability in office) / [x] Appointment(In office/virtual)/ []  [place]/ [] Home Care/ [] Refused Recommended Disposition /[] [facility]/ []  Follow-up with PCP Additional Notes: Patient called in stating she has been experiencing dizziness and blurry vision since her recent hospital stay. Patient states the dizziness is worsened when up trying to walk around, and when changing positions. Patient initially reported to answering agent that she wanted to have her hemoglobin checked because this has been an issue in the past. Patient was recently hospitalized after visiting ER with neck pain. Patient states she has intermittent tingling on her left arm from her elbow to her fingertips as well. Made patient soonest appt for evaluation with PCP. Advised patient to focus on hydration status, and to also change positions very slowly. Advised patient to call 911 if she experiences sudden numbness or weakness in arms, legs, or face. Patient verbalized understanding.   Copied from CRM [id number]. Topic: Clinical - Red Word Triage >> Feb 19, 2024  2:41 PM Elizebeth Brooking wrote: [facility] that prompted transfer to Nurse Triage: Patient called in stating that she wanted to get her hemoglobin check, because she has been feeling very dizzy and everything has been blurry Reason for Disposition  [1] MILD dizziness (e.g., walking normally) AND [2] has NOT been evaluated by doctor (or NP/PA) for this  (Exception: Dizziness caused by heat exposure, sudden standing, or poor fluid intake.)  Answer Assessment - Initial Assessment Questions 1. DESCRIPTION: "Describe your dizziness."     Dizziness when walking/standing up 2. LIGHTHEADED: "Do you feel lightheaded?" (e.g., somewhat faint, woozy, weak  upon standing)     Standing up and walking 3. VERTIGO: "Do you feel like either you or the room is spinning or tilting?" (i.e. vertigo)     N/a 4. SEVERITY: "How bad is it?"  "Do you feel like you are going to faint?" "Can you stand and walk?"   - MILD: Feels slightly dizzy, but walking normally.   - MODERATE: Feels unsteady when walking, but not falling; interferes with normal activities (e.g., school, work).   - SEVERE: Unable to walk without falling, or requires assistance to walk without falling; feels like passing out now.      Moderate - hold on to things when walking around 5. ONSET:  "When did the dizziness begin?"     Since patient left hospital 6. AGGRAVATING FACTORS: "Does anything make it worse?" (e.g., standing, change in head position)     Standing, walking 8. CAUSE: "What do you think is causing the dizziness?"     Not sure 9. RECURRENT SYMPTOM: "Have you had dizziness before?" If Yes, ask: "When was the last time?" "What happened that time?"     "I dont think so" 10. OTHER SYMPTOMS: "Do you have any other symptoms?" (e.g., fever, chest pain, vomiting, diarrhea, bleeding)       Blurry vision  Protocols used: Dizziness - Lightheadedness-A-AH

## 2024-02-20 ENCOUNTER — Telehealth: Payer: Self-pay | Admitting: Psychiatry

## 2024-02-20 ENCOUNTER — Other Ambulatory Visit: Payer: Self-pay

## 2024-02-20 DIAGNOSIS — F3181 Bipolar II disorder: Secondary | ICD-10-CM

## 2024-02-20 MED ORDER — LAMOTRIGINE 150 MG PO TABS: 150.0000 mg | ORAL_TABLET | Freq: Every day | ORAL | 0 refills | Status: DC

## 2024-02-20 NOTE — Telephone Encounter (Signed)
 Sent rx  Lamictal 90 day to [facility].

## 2024-02-20 NOTE — Telephone Encounter (Signed)
 Pt requested 90 day refill as she says she pays the same for 90 days as 30 days.

## 2024-02-20 NOTE — Telephone Encounter (Signed)
 Pt  called at 1:07p requesting refill of Lamotrigine to   [facility] [id number] [place], [place] - [address] [place], [place] [address] Phone: [phone]  Fax: [phone]   Next appt 3/25

## 2024-02-21 ENCOUNTER — Encounter: Payer: Self-pay | Admitting: Family Medicine

## 2024-02-21 ENCOUNTER — Ambulatory Visit: Payer: Federal, State, Local not specified - PPO | Admitting: Family Medicine

## 2024-02-21 VITALS — BP 130/70 | HR 80 | Temp 97.2°F | Resp 16 | Ht 60.0 in | Wt 121.4 lb

## 2024-02-21 DIAGNOSIS — R42 Dizziness and giddiness: Secondary | ICD-10-CM

## 2024-02-21 LAB — CBC WITH DIFFERENTIAL/PLATELET
Basophils Absolute: 0 10*3/uL (ref 0.0–0.1)
Basophils Relative: 0.6 % (ref 0.0–3.0)
Eosinophils Absolute: 0 10*3/uL (ref 0.0–0.7)
Eosinophils Relative: 0.6 % (ref 0.0–5.0)
HCT: 34.2 % — ABNORMAL LOW (ref 36.0–46.0)
Hemoglobin: 11.3 g/dL — ABNORMAL LOW (ref 12.0–15.0)
Lymphocytes Relative: 17.2 % (ref 12.0–46.0)
Lymphs Abs: 0.9 10*3/uL (ref 0.7–4.0)
MCHC: 33.1 g/dL (ref 30.0–36.0)
MCV: 91.1 fL (ref 78.0–100.0)
Monocytes Absolute: 0.4 10*3/uL (ref 0.1–1.0)
Monocytes Relative: 6.9 % (ref 3.0–12.0)
Neutro Abs: 4.1 10*3/uL (ref 1.4–7.7)
Neutrophils Relative %: 74.7 % (ref 43.0–77.0)
Platelets: 253 10*3/uL (ref 150.0–400.0)
RBC: 3.76 Mil/uL — ABNORMAL LOW (ref 3.87–5.11)
RDW: 14 % (ref 11.5–15.5)
WBC: 5.5 10*3/uL (ref 4.0–10.5)

## 2024-02-21 LAB — COMPREHENSIVE METABOLIC PANEL
ALT: 9 U/L (ref 0–35)
AST: 19 U/L (ref 0–37)
Albumin: 4.2 g/dL (ref 3.5–5.2)
Alkaline Phosphatase: 61 U/L (ref 39–117)
BUN: 16 mg/dL (ref 6–23)
CO2: 26 meq/L (ref 19–32)
Calcium: 9.6 mg/dL (ref 8.4–10.5)
Chloride: 99 meq/L (ref 96–112)
Creatinine, Ser: 0.87 mg/dL (ref 0.40–1.20)
GFR: 61.44 mL/min (ref >60.00)
Glucose, Bld: 100 mg/dL — ABNORMAL HIGH (ref 70–99)
Potassium: 3.9 meq/L (ref 3.5–5.1)
Sodium: 135 meq/L (ref 135–145)
Total Bilirubin: 0.5 mg/dL (ref 0.2–1.2)
Total Protein: 7.6 g/dL (ref 6.0–8.3)

## 2024-02-21 LAB — TSH: TSH: 1.69 u[IU]/mL (ref 0.35–5.50)

## 2024-02-21 NOTE — Progress Notes (Signed)
 Subjective:     Patient ID: Christine Scott, female    DOB: [birth year], 83 y.o.   MRN: [id number]  Chief Complaint  Patient presents with   Follow-up    Hospital Follow-up for dizziness on 01/30/24 after having neck surgery    HPI Dizziness  Last etoh dec 5  Discussed the use of AI scribe software for clinical note transcription with the patient, who gave verbal consent to proceed.  History of Present Illness   Christine Scott is an 83 year old female with rheumatoid arthritis and Sjogren's syndrome who presents with dizziness following neck surgery. She is accompanied by her friend, Jennye Boroughs.  She has been experiencing dizziness for about a week, starting about 2 wks following neck surgery on February 4th. The dizziness began after discontinuation of gabapentin, Flexeril, and hydrocodone. She describes the dizziness as a 'fuzzy' and lightheaded feeling, particularly when walking, but not when sitting or standing. No sensation of the room spinning, shortness of breath, chest tightness, or vomiting, although she has felt nauseous at times. Her appetite has decreased, and she has lost about four to five pounds since January. of note, she ran out of lamictal for about 5 days as well.   She has a history of rheumatoid arthritis and Sjogren's syndrome and was previously on methotrexate, which she stopped some time ago. She has been off lamotrigine for about five days due to a delay in refilling her prescription, previously taking 150 mg daily in the mornings. She has not been drinking alcohol since December 5th, despite previously consuming it.  She was recently checked for polymyalgia rheumatica and had blood tests showing low hemoglobin levels, leading to a referral to a hematologist. Her white blood cell count and platelets had previously dropped but returned to normal, while her hemoglobin has continued to decrease.  She reports increased confusion since her surgery, which her friend Jennye Boroughs  has also noticed. She has not been driving due to her dizziness and confusion.       There are no preventive care reminders to display for this patient.  Past Medical History:  Diagnosis Date   Abnormal Pap smear of cervix    --hx abnormal paps in her 30s and per patient this was reason for her Hysterectomy   alcohol    Long hx of alcohol abuse since husband passed in 2015.   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 Trial of PT/injection. If doesn't do well good candidate for reverse shoulder arthoplasty. Continue with PT (12/19). No specific f/u was scheduled.    Osteoarthritis    hands   Osteoporosis    Polymyalgia rheumatica (HCC)     Past Surgical History:  Procedure Laterality Date   ANTERIOR CERVICAL DECOMP/DISCECTOMY FUSION N/A 01/30/2024   Procedure: ANTERIOR CERVICAL DECOMPRESSION AND FUSION CERVICAL THREE-CERVICAL FOUR/CERVICAL FOUR-CERVICAL FIVE;  Surgeon: Julio Sicks, MD;  Location: [facility];  Service: Neurosurgery;  Laterality: N/A;  3C   BREAST SURGERY  2016   benign mass removed in [place], [place].  COLONOSCOPY     FACIAL COSMETIC SURGERY     REVERSE SHOULDER ARTHROPLASTY Left 04/16/2020   Procedure: REVERSE SHOULDER ARTHROPLASTY;  Surgeon: Francena Hanly, MD;  Location: [facility];  Service: Orthopedics;  Laterality: Left;    TOTAL VAGINAL HYSTERECTOMY  age 69   --due to abnormal pap smears per patient     Current Outpatient Medications:    acetaminophen (TYLENOL) 500 MG tablet, Take 1,000 mg by mouth at bedtime., Disp: , Rfl:    amLODipine (NORVASC) 10 MG tablet, Take 1 tablet (10 mg total) by mouth daily.,  Disp: 90 tablet, Rfl: 1   B Complex-C (SUPER B COMPLEX PO), Take 1 tablet by mouth daily., Disp: , Rfl:    Calcium Carb-Cholecalciferol (CALCIUM 600 + D PO), Take 1 tablet by mouth daily., Disp: , Rfl:    conjugated estrogens (PREMARIN) vaginal cream, Place 1/2 gram per vagina at bedtime two to three times weekly., Disp: 30 g, Rfl: 1   cycloSPORINE (RESTASIS) 0.05 % ophthalmic emulsion, Place 1 drop into both eyes 2 (two) times daily., Disp: , Rfl:    denosumab (PROLIA) 60 MG/ML SOSY injection, Inject 60 mg into the skin every 6 (six) months., Disp: , Rfl:    folic acid (FOLVITE) 1 MG tablet, Take 1 mg by mouth daily., Disp: , Rfl:    lamoTRIgine (LAMICTAL) 150 MG tablet, Take 1 tablet (150 mg total) by mouth daily., Disp: 90 tablet, Rfl: 0   losartan (COZAAR) 25 MG tablet, Take 1 tablet (25 mg total) by mouth daily., Disp: 90 tablet, Rfl: 3   Multiple Vitamins-Minerals (MULTIVITAMIN PO), Take 1 tablet by mouth daily. Centrum silver, Disp: , Rfl:    Omega-3 Fatty Acids (FISH OIL) 1000 MG CAPS, Take 1,000 mg by mouth daily.  (Patient not taking: Reported on 02/21/2024), Disp: , Rfl:   No Known Allergies ROS neg/noncontributory except as noted HPI/below      Objective:     BP 130/70 (Patient Position: Standing)   Pulse 80   Temp (!) 97.2 F (36.2 C) (Temporal)   Resp 16   Ht 5' (1.524 m)   Wt 121 lb 6 oz (55.1 kg)   LMP  (LMP Unknown)   SpO2 99%   BMI 23.70 kg/m  Wt Readings from Last 3 Encounters:  02/21/24 121 lb 6 oz (55.1 kg)  01/30/24 118 lb (53.5 kg)  01/29/24 125 lb 1.6 oz (56.7 kg)    Physical Exam   Gen: WDWN NAD HEENT: NCAT, conjunctiva not injected, sclera nonicteric NECK:  supple, no thyromegaly, no nodes, no carotid bruits CARDIAC: RRR, S1S2+, no murmur. +click LUNGS: CTAB. No wheezes ABDOMEN:  BS+, soft, NTND, No HSM, no masses EXT:  no edema MSK: no gross abnormalities.  NEURO: A&O x3.  CN II-XII intact.  PSYCH: normal mood. Good eye contact  Felt dizzy  upon standing but bp's not orthostatic  Reviewed labs in chart     Assessment & Plan:  Dizziness -     Comprehensive metabolic panel -     CBC with Differential/Platelet -     TSH  Assessment and Plan    Dizziness Dizziness began one week ago, about 2 wks after neck surgery, presenting as a fuzzy-headed and lightheaded sensation, especially when walking. There is no vertigo. Possible causes include abrupt discontinuation of Lamictal, recent cessation of gabapentin, Flexeril, and hydrocodone, and low hemoglobin levels. Differential diagnosis includes medication withdrawal effects and anemia. Order blood tests to check sodium, potassium, and other electrolytes.  Resume Lamictal 150 mg daily. Avoid driving until dizziness resolves. Refer to a hematologist for further evaluation of hemoglobin levels.  Anemia Hemoglobin level is 9.9 as of February 17th. There is a history of fluctuating CBCs with low white count and platelets, now normalized, but hemoglobin continues to drop. Possible iron deficiency anemia or other hematologic disorder. Further evaluation by a hematologist is needed. has appt tomorrow w/heme. . Order repeat CBC. Monitor for symptoms of acute confusion, slurred speech, or weakness and go to the hospital if these occur.  Medication Management Recent discontinuation of gabapentin, Flexeril, and hydrocodone, with Lamictal running out five days ago, may contribute to dizziness due to potential withdrawal effects. Lamictal should be tapered gradually over two weeks. Resume Lamictal 150 mg daily. Monitor for side effects and effectiveness of resumed medication.  Postoperative Status Following neck surgery on February 4th, there is no current pain in arms or shoulders. Some confusion noted post-surgery, possibly related to anesthesia and medication changes. Monitor for improvement in cognitive function. Encourage hydration and adequate nutrition, including protein drinks if appetite is  poor.  General Health Maintenance Rheumatoid arthritis and Sjogren's syndrome are present, with no current methotrexate use. Folic acid supplementation has been discussed. Discuss with a rheumatologist whether to continue folic acid supplementation. Encourage drinking plenty of water and maintaining adequate nutrition.  Follow-up Follow up with a hematologist for further evaluation of anemia and CBC abnormalities. Monitor for any acute changes in symptoms and seek immediate care if necessary.        Return if symptoms worsen or fail to improve.  Angelena Sole, MD

## 2024-02-21 NOTE — Patient Instructions (Signed)
 Consider protein drinks Drink more water.    Worse, new symptoms, ER

## 2024-02-21 NOTE — Progress Notes (Signed)
 Labs ok.  Hgb better.  Still keep appt w/heme as we were getting some strange readings

## 2024-02-22 ENCOUNTER — Inpatient Hospital Stay: Payer: Federal, State, Local not specified - PPO | Attending: Nurse Practitioner | Admitting: Nurse Practitioner

## 2024-02-22 ENCOUNTER — Inpatient Hospital Stay: Payer: Federal, State, Local not specified - PPO

## 2024-02-22 ENCOUNTER — Encounter: Payer: Self-pay | Admitting: Nurse Practitioner

## 2024-02-22 ENCOUNTER — Telehealth: Payer: Self-pay

## 2024-02-22 VITALS — BP 151/68 | HR 78 | Temp 97.2°F | Resp 18 | Wt 123.0 lb

## 2024-02-22 DIAGNOSIS — Z800 Family history of malignant neoplasm of digestive organs: Secondary | ICD-10-CM | POA: Insufficient documentation

## 2024-02-22 DIAGNOSIS — Z79899 Other long term (current) drug therapy: Secondary | ICD-10-CM | POA: Insufficient documentation

## 2024-02-22 DIAGNOSIS — D649 Anemia, unspecified: Secondary | ICD-10-CM | POA: Diagnosis not present

## 2024-02-22 DIAGNOSIS — D509 Iron deficiency anemia, unspecified: Secondary | ICD-10-CM | POA: Insufficient documentation

## 2024-02-22 DIAGNOSIS — D61818 Other pancytopenia: Secondary | ICD-10-CM

## 2024-02-22 LAB — CBC WITH DIFFERENTIAL (CANCER CENTER ONLY)
Abs Immature Granulocytes: 0.01 10*3/uL (ref 0.00–0.07)
Basophils Absolute: 0 10*3/uL (ref 0.0–0.1)
Basophils Relative: 1 %
Eosinophils Absolute: 0.1 10*3/uL (ref 0.0–0.5)
Eosinophils Relative: 2 %
HCT: 32.8 % — ABNORMAL LOW (ref 36.0–46.0)
Hemoglobin: 10.6 g/dL — ABNORMAL LOW (ref 12.0–15.0)
Immature Granulocytes: 0 %
Lymphocytes Relative: 23 %
Lymphs Abs: 1.1 10*3/uL (ref 0.7–4.0)
MCH: 29.5 pg (ref 26.0–34.0)
MCHC: 32.3 g/dL (ref 30.0–36.0)
MCV: 91.4 fL (ref 80.0–100.0)
Monocytes Absolute: 0.4 10*3/uL (ref 0.1–1.0)
Monocytes Relative: 9 %
Neutro Abs: 3 10*3/uL (ref 1.7–7.7)
Neutrophils Relative %: 65 %
Platelet Count: 237 10*3/uL (ref 150–400)
RBC: 3.59 MIL/uL — ABNORMAL LOW (ref 3.87–5.11)
RDW: 13.6 % (ref 11.5–15.5)
WBC Count: 4.6 10*3/uL (ref 4.0–10.5)
nRBC: 0 % (ref 0.0–0.2)

## 2024-02-22 LAB — FOLATE: Folate: 35.1 ng/mL (ref >5.9)

## 2024-02-22 LAB — SAVE SMEAR(SSMR), FOR PROVIDER SLIDE REVIEW

## 2024-02-22 LAB — FERRITIN: Ferritin: 73 ng/mL (ref 11–307)

## 2024-02-22 LAB — RETIC PANEL
Immature Retic Fract: 5.3 % (ref 2.3–15.9)
RBC.: 3.58 MIL/uL — ABNORMAL LOW (ref 3.87–5.11)
Retic Count, Absolute: 44 10*3/uL (ref 19.0–186.0)
Retic Ct Pct: 1.2 % (ref 0.4–3.1)
Reticulocyte Hemoglobin: 32.3 pg (ref >27.9)

## 2024-02-22 LAB — IRON AND TIBC
Iron: 53 ug/dL (ref 28–170)
Saturation Ratios: 13 % (ref 10.4–31.8)
TIBC: 396 ug/dL (ref 250–450)
UIBC: 343 ug/dL

## 2024-02-22 LAB — VITAMIN B12: Vitamin B-12: 380 pg/mL (ref 180–914)

## 2024-02-22 LAB — IMMATURE PLATELET FRACTION: Immature Platelet Fraction: 2.4 % (ref 1.2–8.6)

## 2024-02-22 NOTE — Progress Notes (Addendum)
 [facility]   [phone] Fax:([phone]) [phone]   Clinic New consult Note   Patient Care Team: [name], MD as PCP - General (Family Medicine) Jodelle Red, MD as PCP - Cardiology (Cardiology) Francena Hanly, MD as Consulting Physician (Orthopedic Surgery) Van Clines, MD as Consulting Physician (Neurology) Jodelle Red, MD as Consulting Physician (Cardiology) 02/22/2024  CHIEF COMPLAINTS/PURPOSE OF CONSULTATION:  Pancytopenia, referred by PCP  HISTORY OF PRESENTING ILLNESS:  Marina Gravel Stradley 83 y.o. female with PMH including Sjogren syndrome, polymyalgia rheumatica, arthritis, osteoporosis, HTN, bipolar, DDD, spinal stenosis, depression, and alcohol use disorder is here because of abnormal blood counts.  She has had a mild intermittent anemia Hgb 10.7-12.6 range since 02/25/2019.  She went to ED 1/14 for severe neck pain and was found to have WBC 1.7 and plt 71K, but normal hgb. She underwent planned cervical fusion 01/30/24, labs on 2/3 showed normal WBC and Plt, hgb 11.3 which dropped slightly after surgery to hgb 9.9. She was referred here for pancytopenia.   Socially she is widowed, here with her long-term companion.  She has 2 healthy daughters.  Moved here from [place] in 2015 after her husband passed.  She had problematic alcohol use intermittently since then, but has stopped in the past few months without alcohol since 12/27/2023.  Denies tobacco or other drug use.  She is independent with ADLs and up-to-date on cancer screenings, last colonoscopy per Dr. Dulce Sellar to be her last.  Her daughter survived colon cancer in her 34s, her brother also had colon cancer and is deceased for other reasons.   Ms. Totman presents today with her significant other.  She is feeling well in general, has recovered from recent cervical procedure.  Had some issues with dizziness from postop medication but that has resolved.  PMR is stable.  Takes NSAIDs  prophylactically most nights.  She ran out of Lamictal but is back on it and feeling better.  Denies obvious bleeding, fever, drenching night sweats, unintentional weight loss.   MEDICAL HISTORY:  Past Medical History:  Diagnosis Date   Abnormal Pap smear of cervix    --hx abnormal paps in her 30s and per patient this was reason for her Hysterectomy   alcohol    Long hx of alcohol abuse since husband passed in 2015.   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 Trial of PT/injection. If doesn't do well good candidate for reverse shoulder arthoplasty. Continue with PT (12/19). No specific f/u was scheduled.    Osteoarthritis    hands   Osteoporosis    Polymyalgia rheumatica (HCC)     SURGICAL HISTORY: Past Surgical History:  Procedure Laterality Date   ANTERIOR CERVICAL DECOMP/DISCECTOMY FUSION N/A 01/30/2024   Procedure: ANTERIOR CERVICAL DECOMPRESSION AND FUSION CERVICAL THREE-CERVICAL FOUR/CERVICAL FOUR-CERVICAL FIVE;  Surgeon: Julio Sicks, MD;  Location: [facility];  Service: Neurosurgery;  Laterality: N/A;  3C   BREAST SURGERY  2016   benign mass removed in [place], [place].   COLONOSCOPY     FACIAL COSMETIC SURGERY  REVERSE SHOULDER ARTHROPLASTY Left 04/16/2020   Procedure: REVERSE SHOULDER ARTHROPLASTY;  Surgeon: Francena Hanly, MD;  Location: [facility];  Service: Orthopedics;  Laterality: Left;    TOTAL VAGINAL HYSTERECTOMY  age 41   --due to abnormal pap smears per patient    SOCIAL HISTORY: Social History   Socioeconomic History   Marital status: Widowed    Spouse name: Not on file   Number of children: 2   Years of  education: Not on file   Highest education level: Not on file  Occupational History   Not on file  Tobacco Use   Smoking status: Never    Passive exposure: Never   Smokeless tobacco: Never  Vaping Use   Vaping status: Never Used  Substance and Sexual Activity   Alcohol use: Yes    Alcohol/week: 2.0 standard drinks of alcohol    Types: 2 Glasses of wine per week    Comment: wine occ   Drug use: Never   Sexual activity: Not Currently    Partners: Male    Birth control/protection: Surgical    Comment: vaginal hysterectomy  Other Topics Concern   Not on file  Social History Narrative   Right handed   Lives alone   Social Drivers of Health   Financial Resource Strain: Low Risk  (10/13/2022)   Received from [facility], [facility]   Overall Financial Resource Strain (CARDIA)    Difficulty of Paying Living Expenses: Not hard at all  Food Insecurity: No Food Insecurity (02/22/2024)   Hunger Vital Sign    Worried About Running Out of Food in the Last Year: Never true    Ran Out of Food in the Last Year: Never true  Transportation Needs: No Transportation Needs (02/22/2024)   PRAPARE - Administrator, Civil Service (Medical): No    Lack of Transportation (Non-Medical): No  Physical Activity: Not on file  Stress: Not on file  Social Connections: Moderately Integrated (01/13/2024)   Social Connection and Isolation Panel [NHANES]    Frequency of Communication with Friends and Family: Three times a week    Frequency of Social Gatherings with Friends and Family: Three times a week    Attends Religious Services: 1 to 4 times per year    Active Member of Clubs or Organizations: No    Attends Banker Meetings: 1 to 4 times per year    Marital Status: Widowed  Intimate Partner Violence: Not At Risk (01/13/2024)   Humiliation, Afraid, Rape, and Kick questionnaire    Fear of Current or Ex-Partner: No    Emotionally Abused: No     Physically Abused: No    Sexually Abused: No    FAMILY HISTORY: Family History  Problem Relation Age of Onset   Congestive Heart Failure Mother    Stroke Mother    Diabetes Father    Colon cancer Brother    Diabetes Brother    Diabetes Brother    Stroke Brother    Heart attack Brother    Colon cancer Daughter    BRCA 1/2 Neg Hx    Breast cancer Neg Hx     ALLERGIES:  has no known allergies.  MEDICATIONS:  Current Outpatient Medications  Medication Sig Dispense Refill   acetaminophen (TYLENOL) 500 MG tablet Take 1,000 mg by mouth at bedtime.     amLODipine (NORVASC) 10 MG tablet Take 1 tablet (10 mg total) by mouth daily. 90 tablet 1  B Complex-C (SUPER B COMPLEX PO) Take 1 tablet by mouth daily.     Calcium Carb-Cholecalciferol (CALCIUM 600 + D PO) Take 1 tablet by mouth daily.     cycloSPORINE (RESTASIS) 0.05 % ophthalmic emulsion Place 1 drop into both eyes 2 (two) times daily.     denosumab (PROLIA) 60 MG/ML SOSY injection Inject 60 mg into the skin every 6 (six) months.     folic acid (FOLVITE) 1 MG tablet Take 1 mg by mouth daily.     lamoTRIgine (LAMICTAL) 150 MG tablet Take 1 tablet (150 mg total) by mouth daily. 90 tablet 0   losartan (COZAAR) 25 MG tablet Take 1 tablet (25 mg total) by mouth daily. 90 tablet 3   Multiple Vitamins-Minerals (MULTIVITAMIN PO) Take 1 tablet by mouth daily. Centrum silver     conjugated estrogens (PREMARIN) vaginal cream Place 1/2 gram per vagina at bedtime two to three times weekly. (Patient not taking: Reported on 02/22/2024) 30 g 1   Omega-3 Fatty Acids (FISH OIL) 1000 MG CAPS Take 1,000 mg by mouth daily.  (Patient not taking: Reported on 02/21/2024)     No current facility-administered medications for this visit.    REVIEW OF SYSTEMS:   Constitutional: Denies fevers, chills, unintentional weight loss or abnormal night sweats Eyes: Denies blurriness of vision, double vision or watery eyes Ears, nose, mouth, throat, and face: Denies  mucositis or sore throat (+) Sjogren's Respiratory: Denies cough, dyspnea or wheezes Cardiovascular: Denies palpitation, chest discomfort or lower extremity swelling Gastrointestinal:  Denies nausea, heartburn, hematochezia, or change in bowel habits Skin: Denies abnormal skin rashes or bruising/bleeding Lymphatics: Denies new lymphadenopathy or easy bruising Neurological:Denies numbness, tingling or new weaknesses Behavioral/Psych: Mood is stable, no new changes  All other systems were reviewed with the patient and are negative.  PHYSICAL EXAMINATION: ECOG PERFORMANCE STATUS: 0 - Asymptomatic  Vitals:   02/22/24 0934 02/22/24 0947  BP: (!) 177/79 (!) 151/68  Pulse: 78   Resp: 18   Temp: (!) 97.2 F (36.2 C)   SpO2: 100%    Filed Weights   02/22/24 0934  Weight: 123 lb (55.8 kg)    GENERAL:alert, no distress and comfortable SKIN: no rash, ecchymoses, or spider telangiectasia EYES: sclera clear OROPHARYNX: small palpable submandibular gland, no oral lesions.  NECK: supple, thyroid normal size, non-tender, without nodularity LYMPH:  no palpable cervical or supraclavicular lymphadenopathy LUNGS: clear with normal breathing effort HEART: regular rate & rhythm, no lower extremity edema ABDOMEN:abdomen soft, non-tender and normal bowel sounds. No hepatosplenomegaly  Musculoskeletal:no cyanosis of digits and no clubbing  PSYCH: alert & oriented x 3 with fluent speech NEURO: no focal motor/sensory deficits  LABORATORY DATA:  I have reviewed the data as listed    Latest Ref Rng & Units 02/22/2024    8:48 AM 02/21/2024    1:49 PM 02/12/2024    2:26 PM  CBC  WBC 4.0 - 10.5 K/uL 4.6  5.5  4.8   Hemoglobin 12.0 - 15.0 g/dL 75.6  43.3  9.9   Hematocrit 36.0 - 46.0 % 32.8  34.2  30.1   Platelets 150 - 400 K/uL 237  253.0  317        Latest Ref Rng & Units 02/21/2024    1:49 PM 01/29/2024    2:00 PM 01/14/2024    7:38 AM  CMP  Glucose 70 - 99 mg/dL 295  97  188   BUN 6 - 23  mg/dL 16  14  20  Creatinine 0.40 - 1.20 mg/dL 7.82  9.56  2.13   Sodium 135 - 145 mEq/L 135  139  133   Potassium 3.5 - 5.1 mEq/L 3.9  4.1  4.5   Chloride 96 - 112 mEq/L 99  101  100   CO2 19 - 32 mEq/L 26  27  22    Calcium 8.4 - 10.5 mg/dL 9.6  9.2  9.0   Total Protein 6.0 - 8.3 g/dL 7.6  6.9    Total Bilirubin 0.2 - 1.2 mg/dL 0.5  0.6    Alkaline Phos 39 - 117 U/L 61  55    AST 0 - 37 U/L 19  23    ALT 0 - 35 U/L 9  15       RADIOGRAPHIC STUDIES: I have personally reviewed the radiological images as listed and agreed with the findings in the report. DG Cervical Spine 1 View Result Date: 01/30/2024 CLINICAL DATA:  Elective surgery. EXAM: DG CERVICAL SPINE - 1 VIEW COMPARISON:  None Available. FINDINGS: Three fluoroscopic spot views of the cervical spine submitted from the operating room. Anterior fusion hardware C3 through C5 with interbody spacers. Fluoroscopy time 4.9 seconds. Dose 0.34 mGy. IMPRESSION: Intraoperative fluoroscopy during cervical fusion. Electronically Signed   By: Narda Rutherford M.D.   On: 01/30/2024 15:11   DG C-Arm 1-60 Min-No Report Result Date: 01/30/2024 Fluoroscopy was utilized by the requesting physician.  No radiographic interpretation.   DG C-Arm 1-60 Min-No Report Result Date: 01/30/2024 Fluoroscopy was utilized by the requesting physician.  No radiographic interpretation.    ASSESSMENT & PLAN: 83 yo female   Microcytic anemia -We reviewed her medical rec in detail with the patient, she has had a mild intermittent anemia with Hgb 10.5-12 range since at least 2018, dropped slightly to 9.9 with recent cervical fusion surgery but has recovered to baseline -She had isolated leukopenia and thrombocytopenia in 12/2023 which has resolved -Iron/TIBC, ferritin, and retic from today are normal. No evidence of CKD. -Seroquel lists anemia as a potential SE, no other contributing meds -Peripheral smear 02/22/24: Platelets appear normal in number with a few small  clumps.  White cell morphology appears unremarkable.  RBCs show polychromasia is not increased, a few ovalocytes and teardrops.  There are microcytes.  No nucleated red cells. 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 -We will check multiple myeloma panel and light chains. -Iron deficiency anemia remains on the differential despite normal iron panel and ferritin due to the presence of microcytes.  We will check a soluble transferrin receptor  -We will follow-up on the results from today's labs -We plan to see her back with repeat lab visit in follow-up in 3 months, at which time we will consider further evaluation of liver disease or bone marrow biopsy if she has worsening anemia or new cytopenias -Patient seen with Dr. Truett Perna   PLAN: -Lab today, will call pt with results -Lab and office visit in 3 months  -Pt seen with Dr. Truett Perna   Orders Placed This Encounter  Procedures   CBC with Differential (Cancer Center Only)    Standing Status:   Future    Number of Occurrences:   1    Expiration Date:   02/21/2025   Ferritin    Standing Status:   Future    Number of Occurrences:   1  Expiration Date:   02/21/2025   Iron and TIBC ([facility] ONLY)    Standing Status:   Future    Number of Occurrences:   1    Expiration Date:   02/21/2025   Immature Platelet Fraction    Standing Status:   Future    Number of Occurrences:   1    Expiration Date:   02/21/2025   Retic Panel    Standing Status:   Future    Number of Occurrences:   1    Expiration Date:   02/21/2025   Save Smear for Provider Slide Review    Standing Status:   Future    Number of Occurrences:   1    Expiration Date:   02/21/2025   Vitamin B12    Standing Status:   Future    Number of Occurrences:   1    Expiration Date:   02/21/2025    Folate, Serum    Standing Status:   Future    Number of Occurrences:   1    Expiration Date:   02/21/2025   Draw extra clot specimen    Standing Status:   Future    Number of Occurrences:   1    Expiration Date:   02/21/2025   Kappa/lambda light chains    Standing Status:   Future    Number of Occurrences:   1    Expiration Date:   02/21/2025   Multiple Myeloma Panel (SPEP&IFE w/QIG)    Standing Status:   Future    Number of Occurrences:   1    Expiration Date:   02/21/2025   CBC with Differential (Cancer Center Only)    Standing Status:   Future    Expected Date:   05/21/2024    Expiration Date:   02/21/2025   Soluble transferrin receptor    Standing Status:   Future    Expected Date:   05/21/2024    Expiration Date:   02/21/2025      All questions were answered. The patient knows to call the clinic with any problems, questions or concerns.      Pollyann Samples, NP 02/22/24   This was a shared visit with Manus Gunning.  Ms. Vath was interviewed and examined.  I reviewed the peripheral blood smear. She is referred for hematology evaluation of normocytic anemia.  She had leukopenia and thrombocytopenia when she was admitted with neck pain in January.  The white count and platelets are normal today.  Review of the medical record reveals chronic mild anemia.  There is no obvious explanation for the anemia.  The differential diagnosis includes the anemia of chronic disease, myelodysplasia, iron deficiency, unrecognized liver disease, and an infiltrative bone marrow process.  Iron deficiency remains in the differential diagnosis despite the normal ferritin level today.  We obtained additional diagnostic evaluation including a soluble transferrin receptor and myeloma panel.  She will return for an office visit and repeat CBC in approximately 3 months.  We will consider a diagnostic bone marrow biopsy if she develops progressive anemia or another hematologic abnormality. I was present for  greater than 50% of today's visit.  I performed Medical Decision Making.  Mancel Bale, MD

## 2024-02-22 NOTE — Telephone Encounter (Signed)
 Contacted patient in regards to lab results after being seen with [facility] NP today. Made aware   only some lab work has come back, nothing emergent. Will call back when all labs are finalized, understood. No further questions.

## 2024-02-23 LAB — KAPPA/LAMBDA LIGHT CHAINS
Kappa free light chain: 24.6 mg/L — ABNORMAL HIGH (ref 3.3–19.4)
Kappa, lambda light chain ratio: 1.19 (ref 0.26–1.65)
Lambda free light chains: 20.7 mg/L (ref 5.7–26.3)

## 2024-02-26 ENCOUNTER — Telehealth: Payer: Self-pay | Admitting: *Deleted

## 2024-02-26 LAB — MULTIPLE MYELOMA PANEL, SERUM
Albumin SerPl Elph-Mcnc: 3.8 g/dL (ref 2.9–4.4)
Albumin/Glob SerPl: 1.3 (ref 0.7–1.7)
Alpha 1: 0.3 g/dL (ref 0.0–0.4)
Alpha2 Glob SerPl Elph-Mcnc: 0.8 g/dL (ref 0.4–1.0)
B-Globulin SerPl Elph-Mcnc: 0.9 g/dL (ref 0.7–1.3)
Gamma Glob SerPl Elph-Mcnc: 1.1 g/dL (ref 0.4–1.8)
Globulin, Total: 3.1 g/dL (ref 2.2–3.9)
IgA: 227 mg/dL (ref 64–422)
IgG (Immunoglobin G), Serum: 1071 mg/dL (ref 586–1602)
IgM (Immunoglobulin M), Srm: 211 mg/dL (ref 26–217)
Total Protein ELP: 6.9 g/dL (ref 6.0–8.5)

## 2024-02-26 NOTE — Telephone Encounter (Signed)
 Patient called requesting results of her 02/22/24 lab results. Informed her the last result came in this afternoon. Will print off labs for MD review and she will be called tomorrow.

## 2024-02-29 ENCOUNTER — Telehealth: Payer: Self-pay | Admitting: *Deleted

## 2024-02-29 NOTE — Telephone Encounter (Signed)
 Per Dr. Truett Perna: Labs showed no evidence of myeloma. Has mild kappa light chain elevation that is nonspecific (no know cause). Sometimes inflammatory responses can cause a benign elevation. Follow up as scheduled.

## 2024-03-08 ENCOUNTER — Telehealth: Payer: Self-pay | Admitting: Oncology

## 2024-03-08 ENCOUNTER — Encounter: Payer: Self-pay | Admitting: *Deleted

## 2024-03-08 NOTE — Progress Notes (Signed)
 Dr. Truett Perna attempted to call patient 3/13 afternoon to discuss her lab results with no answer. Per Dr. Truett Perna, schedule for telephone visit on 3/21 at 10:10. Scheduling message sent.

## 2024-03-08 NOTE — Telephone Encounter (Signed)
 Spoke with patient confirming upcoming appointment

## 2024-03-14 ENCOUNTER — Ambulatory Visit: Payer: Federal, State, Local not specified - PPO | Admitting: Physician Assistant

## 2024-03-14 ENCOUNTER — Ambulatory Visit: Admitting: Physician Assistant

## 2024-03-15 ENCOUNTER — Inpatient Hospital Stay: Attending: Nurse Practitioner | Admitting: Oncology

## 2024-03-15 DIAGNOSIS — D649 Anemia, unspecified: Secondary | ICD-10-CM | POA: Diagnosis not present

## 2024-03-15 NOTE — Progress Notes (Signed)
  [facility]   HEMATOLOGY- ONCOLOGY TeleHEALTH OFFICE VISIT PROGRESS NOTE  I connected with Christine Scott on 03/15/24 at 10:30 AM by phone and verified that I am speaking with the correct person using two identifiers.   I discussed the limitations, risks, security and privacy concerns of performing an evaluation and management service by telemedicine and the availability of in-person appointments. I also discussed with the patient that there may be a patient responsible charge related to this service. The patient expressed understanding and agreed to proceed.  Patient's location: Home Provider's location: Office   Diagnosis: Anemia  INTERVAL HISTORY:   We saw Christine Scott 02/22/2024 for evaluation of anemia.  She requested a telehealth visit to discuss our evaluation. She has intermittent dizziness when getting up to ambulate.  No other complaint.   Lab Results:  Lab Results  Component Value Date   WBC 4.6 02/22/2024   HGB 10.6 (L) 02/22/2024   HCT 32.8 (L) 02/22/2024   MCV 91.4 02/22/2024   PLT 237 02/22/2024   NEUTROABS 3.0 02/22/2024    Medications: I have reviewed the patient's current medications.  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  I reviewed results of the laboratory testing obtained 02/22/2024.  There is mild elevation of the serum free kappa light chains, but a myeloma panel was otherwise negative.  The kappa light chain elevation is likely benign nonspecific finding.  The ferritin, TSH, and vitamin B12 returned normal.  The differential diagnosis includes the anemia of chronic disease and myelodysplasia.  The plan is to follow the hemoglobin and consider a diagnostic bone marrow biopsy if she develops progressive anemia.  She will return for a follow-up office visit and CBC as  scheduled on 05/14/2024.   I discussed the assessment and treatment plan with the patient. The patient was provided an opportunity to ask questions and all were answered. The patient agreed with the plan and demonstrated an understanding of the instructions.   The patient was advised to call back or seek an in-person evaluation if the symptoms worsen or if the condition fails to improve as anticipated.  I provided 20 minutes of chart review, telephone, and documentation time during this encounter, and > 50% was spent counseling as documented under my assessment & plan.  Thornton Papas MD  03/15/2024 10:30 AM

## 2024-03-19 ENCOUNTER — Ambulatory Visit (INDEPENDENT_AMBULATORY_CARE_PROVIDER_SITE_OTHER): Payer: Medicare Other | Admitting: Psychiatry

## 2024-03-19 ENCOUNTER — Encounter: Payer: Self-pay | Admitting: Psychiatry

## 2024-03-19 DIAGNOSIS — F1011 Alcohol abuse, in remission: Secondary | ICD-10-CM | POA: Diagnosis not present

## 2024-03-19 DIAGNOSIS — F3181 Bipolar II disorder: Secondary | ICD-10-CM | POA: Diagnosis not present

## 2024-03-19 DIAGNOSIS — F411 Generalized anxiety disorder: Secondary | ICD-10-CM

## 2024-03-19 MED ORDER — LAMOTRIGINE 150 MG PO TABS: 150.0000 mg | ORAL_TABLET | Freq: Every day | ORAL | 3 refills | Status: AC
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
## 2024-04-11 ENCOUNTER — Encounter: Payer: Self-pay | Admitting: Family Medicine

## 2024-04-11 ENCOUNTER — Ambulatory Visit (INDEPENDENT_AMBULATORY_CARE_PROVIDER_SITE_OTHER): Payer: Federal, State, Local not specified - PPO | Admitting: Family Medicine

## 2024-04-11 ENCOUNTER — Telehealth: Payer: Self-pay

## 2024-04-11 VITALS — BP 140/70 | HR 77 | Temp 98.2°F | Ht 60.0 in | Wt 123.8 lb

## 2024-04-11 DIAGNOSIS — D61818 Other pancytopenia: Secondary | ICD-10-CM | POA: Diagnosis not present

## 2024-04-11 DIAGNOSIS — M810 Age-related osteoporosis without current pathological fracture: Secondary | ICD-10-CM

## 2024-04-11 DIAGNOSIS — F3181 Bipolar II disorder: Secondary | ICD-10-CM | POA: Diagnosis not present

## 2024-04-11 DIAGNOSIS — I10 Essential (primary) hypertension: Secondary | ICD-10-CM

## 2024-04-11 MED ORDER — DENOSUMAB 60 MG/ML ~~LOC~~ SOSY: 60.0000 mg | PREFILLED_SYRINGE | Freq: Once | SUBCUTANEOUS | Status: AC

## 2024-04-11 NOTE — Patient Instructions (Signed)
Stop fish oil

## 2024-04-11 NOTE — Telephone Encounter (Signed)
 Can we run a  PA for prolia for pt.  Thank you,  Christy Gentles

## 2024-04-11 NOTE — Progress Notes (Signed)
 Subjective:     Patient ID: Christine Scott, female    DOB: 08-04-1941, 83 y.o.   MRN: [id number]  Chief Complaint  Patient presents with   Osteoporosis   Hypertension    HPI Discussed the use of AI scribe software for clinical note transcription with the patient, who gave verbal consent to proceed.  History of Present Illness Christine Scott is an 83 year old female with hypertension and osteoporosis who presents for a follow-up visit.  She has been monitoring her blood pressure at home, with recent readings on April 12th of 150/79, April 15th of 149/75, April 16th of 137/72, and April 17th of 144/62 in the morning and 148/73 in the afternoon. Her blood pressure has been running a bit high. She is currently taking amlodipine 10 mg and losartan 25 mg for hypertension. When on 50mg  of Losartan, bp's were dropping too low  She is due for her Prolia shot, which she receives every six months, with the last one administered on October 23rd. She is unsure of the exact date of her last shot but believes it is due soon.  She is taking lamotrigine 150 mg once a day for mood stabilization and reports that her moods are stable. No bad headaches, dizziness, chest pain, shortness of breath, or thoughts of suicide.  She has a history of anemia and gastrointestinal bleeding, for which she was referred to hematologist on [facility]. She is scheduled to follow up in May. Her hemoglobin dropped slightly in February, but she has f/u in May.  Feeling much better.  She has stopped consuming alcohol and engages in physical activity, walking five to seven times a week for one and a half to two miles. She is currently taking Tylenol as needed, B12, and a B complex vitamin. She has been taking fish oil but has decided to stop it.    There are no preventive care reminders to display for this patient.  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 Trial of PT/injection. If doesn't do well good candidate for reverse shoulder arthoplasty. Continue with PT (12/19). No specific f/u was scheduled.    Osteoarthritis    hands   Osteoporosis    Polymyalgia rheumatica (HCC)     Past Surgical History:  Procedure Laterality Date   ANTERIOR CERVICAL DECOMP/DISCECTOMY FUSION N/A 01/30/2024   Procedure: ANTERIOR CERVICAL DECOMPRESSION AND FUSION CERVICAL THREE-CERVICAL FOUR/CERVICAL FOUR-CERVICAL FIVE;  Surgeon: Julio Sicks, MD;  Location: [facility];  Service: Neurosurgery;  Laterality: N/A;  3C   BREAST SURGERY  2016   benign mass removed in [place], [place].   COLONOSCOPY     FACIAL COSMETIC SURGERY     REVERSE SHOULDER ARTHROPLASTY Left 04/16/2020   Procedure: REVERSE SHOULDER ARTHROPLASTY;  Surgeon: Rennis Chris,  Caryn Bee, MD;  Location: [facility];  Service: Orthopedics;  Laterality: Left;    TOTAL VAGINAL HYSTERECTOMY  age 89   --due to abnormal pap smears per patient     Current Outpatient Medications:    acetaminophen (TYLENOL) 500 MG tablet, Take 1,000 mg by mouth at bedtime., Disp: , Rfl:    amLODipine (NORVASC) 10 MG tablet, Take 1 tablet (10 mg total) by mouth daily., Disp: 90 tablet, Rfl: 1   B Complex-C (SUPER B COMPLEX PO), Take 1 tablet by mouth daily., Disp: , Rfl:    Calcium Carb-Cholecalciferol (CALCIUM 600 + D PO), Take 1 tablet by mouth daily., Disp: , Rfl:    conjugated estrogens (PREMARIN) vaginal cream, Place 1/2 gram per  vagina at bedtime two to three times weekly., Disp: 30 g, Rfl: 1   cycloSPORINE (RESTASIS) 0.05 % ophthalmic emulsion, Place 1 drop into both eyes 2 (two) times daily., Disp: , Rfl:    denosumab (PROLIA) 60 MG/ML SOSY injection, Inject 60 mg into the skin every 6 (six) months., Disp: , Rfl:    folic acid (FOLVITE) 1 MG tablet, Take 1 mg by mouth daily., Disp: , Rfl:    lamoTRIgine (LAMICTAL) 150 MG tablet, Take 1 tablet (150 mg total) by mouth daily., Disp: 90 tablet, Rfl: 3   losartan (COZAAR) 25 MG tablet, Take 1 tablet (25 mg total) by mouth daily., Disp: 90 tablet, Rfl: 3   Multiple Vitamins-Minerals (MULTIVITAMIN PO), Take 1 tablet by mouth daily. Centrum silver, Disp: , Rfl:    Omega-3 Fatty Acids (FISH OIL) 1000 MG CAPS, Take 1,000 mg by mouth daily., Disp: , Rfl:   Current Facility-Administered Medications:    [START ON 04/25/2024] denosumab (PROLIA) injection 60 mg, 60 mg, Subcutaneous, Once, [name], MD  No Known Allergies ROS neg/noncontributory except as noted HPI/below      Objective:     BP (!) 140/70 (BP Location: Left Arm, Patient Position: Sitting, Cuff Size: Normal)   Pulse 77   Temp 98.2 F (36.8 C)   Ht 5' (1.524 m)   Wt 123 lb 12.8 oz (56.2 kg)   LMP  (LMP Unknown)   SpO2 96%   BMI 24.18 kg/m  Wt Readings from Last 3 Encounters:  04/11/24 123 lb 12.8 oz (56.2 kg)  02/22/24 123 lb (55.8 kg)  02/21/24 121 lb 6 oz (55.1 kg)    Physical Exam   Gen: WDWN NAD HEENT: NCAT, conjunctiva not injected, sclera nonicteric NECK:  supple, no thyromegaly, no nodes, no carotid bruits CARDIAC: RRR, S1S2+, no murmur but + click. DP 1+B LUNGS: CTAB. No wheezes ABDOMEN:  BS+, soft, NTND, No HSM, no masses EXT:  no edema MSK: no gross abnormalities.  NEURO: A&O x3.  CN II-XII intact.  PSYCH: normal mood. Good eye contact     Assessment & Plan:  Essential hypertension  Age-related osteoporosis without current pathological fracture -     Denosumab  Bipolar  II disorder (HCC)  Pancytopenia (HCC)  Assessment and Plan Assessment & Plan Hypertension   Blood pressure readings at home range from 137/72 to 150/79. She is on amlodipine 10 mg and losartan 25 mg. Increasing losartan to 50 mg was considered but not pursued due to her age and risk of hypotension and falls. A blood pressure of 140 is acceptable for her age, and lowering it excessively could lead to falls and fractures, especially with her osteoporosis. Continue amlodipine 10 mg and losartan 25 mg. Monitor blood pressure at home  regularly. Reassess blood pressure management in six months unless symptoms arise.  Osteoporosis   She is due for her Prolia injection, administered every six months. The last injection was in October, and the next is due in April. Order Prolia injection and schedule administration.  Anemia   Anemia -? From autoimmune, chronic dz, alcohol use, other.  The hematologist is seeing her and has f/u May for reevaluation. General Health Maintenance    She is taking vitamin B12 and vitamin D supplements. She has stopped taking fish oil as it was deemed unnecessary. She is physically active, walking 1.5 to 2 miles five to seven times a week, and has ceased alcohol consumption, which is a positive lifestyle change. Continue vitamin B12 and vitamin D supplementation. Encourage regular physical activity. Reinforce alcohol cessation.  Follow-up   She is doing well overall, with improvements since the last visit. She opted for a follow-up in six months unless new symptoms arise. Schedule follow-up appointment in six months unless new symptoms develop.    Return in about 6 months (around 10/11/2024) for chronic follow-up.  Ellsworth Haas, MD

## 2024-04-12 ENCOUNTER — Telehealth: Payer: Self-pay | Admitting: Pharmacy Technician

## 2024-04-12 ENCOUNTER — Other Ambulatory Visit (HOSPITAL_COMMUNITY): Payer: Self-pay

## 2024-04-12 NOTE — Telephone Encounter (Signed)
 Pharmacy Patient Advocate Encounter   Received notification from Pt Calls Messages that prior authorization for Prolia  60MG /ML syringes is required/requested.   Insurance verification completed.   The patient is insured through [facility] .   Per test claim: PA required; PA submitted to above mentioned insurance via CoverMyMeds Key/confirmation #/EOC [id number]  Status is pending

## 2024-04-12 NOTE — Telephone Encounter (Signed)
 PA request has been Submitted. New Encounter has been or will be created for follow up. For additional info see Pharmacy Prior Auth telephone encounter from 04/12/24.

## 2024-04-15 ENCOUNTER — Telehealth: Payer: Self-pay | Admitting: *Deleted

## 2024-04-15 ENCOUNTER — Telehealth: Payer: Self-pay

## 2024-04-15 ENCOUNTER — Other Ambulatory Visit (HOSPITAL_COMMUNITY): Payer: Self-pay

## 2024-04-15 DIAGNOSIS — M810 Age-related osteoporosis without current pathological fracture: Secondary | ICD-10-CM

## 2024-04-15 NOTE — Telephone Encounter (Signed)
 Benefit verification started. Will update referral once complete.

## 2024-04-15 NOTE — Telephone Encounter (Signed)
 Prolia VOB initiated via [email or web address]  Next Prolia inj DUE: NOW

## 2024-04-15 NOTE — Telephone Encounter (Signed)
 Copied from CRM [id number]. Topic: Appointments - Appointment Scheduling >> Apr 15, 2024  2:15 PM [place] wrote: Reason for CRM: Patient needing to schedule for a Prolia  shot

## 2024-04-15 NOTE — Telephone Encounter (Signed)
 Pharmacy Patient Advocate Encounter  Received notification from [facility] that Prior Authorization for PROLIA  has been APPROVED from 03/13/24 to 04/12/25   PA #/Case ID/Reference #: [id number]

## 2024-04-16 ENCOUNTER — Other Ambulatory Visit (HOSPITAL_COMMUNITY): Payer: Self-pay

## 2024-04-16 NOTE — Telephone Encounter (Signed)
 Christine Scott

## 2024-04-16 NOTE — Telephone Encounter (Signed)
 Approved for pharmacy benefit

## 2024-04-16 NOTE — Telephone Encounter (Deleted)
 Medical PA faxed to [phone]. Status pending.

## 2024-04-23 ENCOUNTER — Other Ambulatory Visit: Payer: Self-pay | Admitting: Family Medicine

## 2024-04-23 DIAGNOSIS — I10 Essential (primary) hypertension: Secondary | ICD-10-CM

## 2024-04-24 NOTE — Telephone Encounter (Signed)
 Called insurance at [phone] to initiate PA. Per representative, PA is not required for medical (Buy and Bill). If using pharmacy benefits, prescription must be sent to [facility].  Ref#: [id number]

## 2024-04-24 NOTE — Telephone Encounter (Signed)
 Pt ready for scheduling for PROLIA  on or after : 04/24/24  Option# 1: Buy/Bill (Office supplied medication)  Out-of-pocket cost due at time of clinic visit: $525  Number of injection/visits approved: ---  Primary: [employer]FE[employer] Prolia  co-insurance: 30% Admin fee co-insurance: 30%  Secondary: --- Prolia  co-insurance:  Admin fee co-insurance:   Medical Benefit Details: Date Benefits were checked: 04/15/24 Deductible: NO/ Coinsurance: 30%/ Admin Fee: 30%  Prior Auth: N/A PA# Expiration Date:   # of doses approved: ----------------------------------------------------------------------- Option# 2- Med Obtained from pharmacy:  Pharmacy benefit: Copay $--- MUST USE [facility] (Paid to pharmacy) Admin Fee: 30% (Pay at clinic)  Prior Auth: APPROVED PA# [id number] Expiration Date: 03/13/24-04/12/25  # of doses approved: 2   If patient wants fill through the pharmacy benefit please send prescription to: [facility], and include estimated need by date in rx notes. Pharmacy will ship medication directly to the office.  Patient NOT eligible for Prolia  Copay Card. Copay Card can make patient's cost as little as $25. Link to apply: [email or web address]://[email or web address].[email or web address]/copay  ** This summary of benefits is an estimation of the patient's out-of-pocket cost. Exact cost may very based on individual plan coverage.

## 2024-05-22 ENCOUNTER — Ambulatory Visit: Payer: Federal, State, Local not specified - PPO | Admitting: Nurse Practitioner

## 2024-05-22 ENCOUNTER — Inpatient Hospital Stay: Attending: Nurse Practitioner

## 2024-05-22 ENCOUNTER — Inpatient Hospital Stay (HOSPITAL_BASED_OUTPATIENT_CLINIC_OR_DEPARTMENT_OTHER): Admitting: Nurse Practitioner

## 2024-05-22 ENCOUNTER — Other Ambulatory Visit: Payer: Federal, State, Local not specified - PPO

## 2024-05-22 ENCOUNTER — Encounter: Payer: Self-pay | Admitting: Nurse Practitioner

## 2024-05-22 VITALS — BP 130/64 | HR 66 | Temp 98.1°F | Resp 18 | Ht 60.0 in | Wt 122.7 lb

## 2024-05-22 DIAGNOSIS — D649 Anemia, unspecified: Secondary | ICD-10-CM | POA: Insufficient documentation

## 2024-05-22 DIAGNOSIS — D61818 Other pancytopenia: Secondary | ICD-10-CM

## 2024-05-22 LAB — CBC WITH DIFFERENTIAL (CANCER CENTER ONLY)
Abs Immature Granulocytes: 0.02 10*3/uL (ref 0.00–0.07)
Basophils Absolute: 0 10*3/uL (ref 0.0–0.1)
Basophils Relative: 1 %
Eosinophils Absolute: 0.1 10*3/uL (ref 0.0–0.5)
Eosinophils Relative: 2 %
HCT: 35.6 % — ABNORMAL LOW (ref 36.0–46.0)
Hemoglobin: 11.6 g/dL — ABNORMAL LOW (ref 12.0–15.0)
Immature Granulocytes: 0 %
Lymphocytes Relative: 27 %
Lymphs Abs: 1.4 10*3/uL (ref 0.7–4.0)
MCH: 29.6 pg (ref 26.0–34.0)
MCHC: 32.6 g/dL (ref 30.0–36.0)
MCV: 90.8 fL (ref 80.0–100.0)
Monocytes Absolute: 0.5 10*3/uL (ref 0.1–1.0)
Monocytes Relative: 10 %
Neutro Abs: 3.1 10*3/uL (ref 1.7–7.7)
Neutrophils Relative %: 60 %
Platelet Count: 210 10*3/uL (ref 150–400)
RBC: 3.92 MIL/uL (ref 3.87–5.11)
RDW: 13.4 % (ref 11.5–15.5)
WBC Count: 5.1 10*3/uL (ref 4.0–10.5)
nRBC: 0 % (ref 0.0–0.2)

## 2024-05-22 NOTE — Progress Notes (Signed)
  Christine Scott   Diagnosis: Anemia  INTERVAL HISTORY:   Christine Scott returns for follow-up.  She feels well.  She has a good energy level and appetite.  No interim illnesses or infections.  She has intermittent neck and foot pain which she attributes to "arthritis".  She denies bleeding.  Objective:  Vital signs in last 24 hours:  Blood pressure 130/64, pulse 66, temperature 98.1 F (36.7 C), temperature source Temporal, resp. rate 18, height 5' (1.524 m), weight 122 lb 11.2 oz (55.7 kg), SpO2 98%.     Resp: Lungs clear bilaterally. Cardio: Regular rate and rhythm. GI: No hepatosplenomegaly. Vascular: No leg edema.   Lab Results:  Lab Results  Component Value Date   WBC 5.1 05/22/2024   HGB 11.6 (L) 05/22/2024   HCT 35.6 (L) 05/22/2024   MCV 90.8 05/22/2024   PLT 210 05/22/2024   NEUTROABS 3.1 05/22/2024    Imaging:  No results found.  Medications: I have reviewed the patient's current medications.  Assessment/Plan: Normocytic anemia Leukopenia/thrombocytopenia January 2025-normal 02/22/2024 Mild elevation of free kappa light chains 02/22/2024 PMR    Disposition: Christine Scott remains stable from a hematologic standpoint.  She has a mild normocytic anemia.  We reviewed the differential diagnosis of anemia of chronic disease and myelodysplasia.  We discussed the plan for continued monitoring, consider a diagnostic bone marrow biopsy if she develops progressive anemia.  She expressed good understanding.  She will return for a CBC and follow-up visit in 3 months.  We are available to see her sooner if needed.  Signs/symptoms suggestive of progressive anemia reviewed with her today.    Diana Forster ANP/GNP-BC   05/22/2024  9:54 AM

## 2024-05-23 LAB — SOLUBLE TRANSFERRIN RECEPTOR: Transferrin Receptor: 10.3 nmol/L — ABNORMAL LOW (ref 12.2–27.3)

## 2024-07-18 ENCOUNTER — Ambulatory Visit: Admitting: Family Medicine

## 2024-08-19 ENCOUNTER — Telehealth: Payer: Self-pay | Admitting: Oncology

## 2024-08-19 ENCOUNTER — Telehealth: Payer: Self-pay | Admitting: Nurse Practitioner

## 2024-08-19 ENCOUNTER — Telehealth: Payer: Self-pay | Admitting: *Deleted

## 2024-08-19 NOTE — Telephone Encounter (Signed)
 PT called to cancel appt. And pay balance owed. Asked Pt if she would like to reschedule, PT just wanted to cancel appt and didn't specify reason.

## 2024-08-19 NOTE — Telephone Encounter (Signed)
 Patient had inquired as to why she needs her 3 month f/u appointment when speaking with scheduler. Unable to reach her at home. Sent [employer].

## 2024-08-19 NOTE — Telephone Encounter (Signed)
 PT called to reschedule appt. Morning time was too early. Appointment rescheduled, and left message.

## 2024-08-23 ENCOUNTER — Ambulatory Visit: Admitting: Nurse Practitioner

## 2024-08-23 ENCOUNTER — Other Ambulatory Visit

## 2024-08-23 ENCOUNTER — Inpatient Hospital Stay

## 2024-08-23 ENCOUNTER — Inpatient Hospital Stay: Admitting: Nurse Practitioner

## 2024-08-23 ENCOUNTER — Ambulatory Visit: Admitting: Oncology

## 2024-09-02 ENCOUNTER — Encounter: Payer: Self-pay | Admitting: Family Medicine

## 2024-09-02 ENCOUNTER — Ambulatory Visit: Admitting: Family Medicine

## 2024-09-02 VITALS — BP 138/62 | HR 66 | Temp 97.0°F | Resp 16 | Ht 60.0 in | Wt 124.5 lb

## 2024-09-02 DIAGNOSIS — F3181 Bipolar II disorder: Secondary | ICD-10-CM | POA: Diagnosis not present

## 2024-09-02 DIAGNOSIS — F4321 Adjustment disorder with depressed mood: Secondary | ICD-10-CM | POA: Diagnosis not present

## 2024-09-02 NOTE — Progress Notes (Signed)
 Subjective:     Patient ID: Christine Scott, female    DOB: 1941/04/27, 83 y.o.   MRN: [id number]  Chief Complaint  Patient presents with   Referral    Discuss need for referral to psychology   Follow-up    Discuss need for Covid vaccine    HPI Discussed the use of AI scribe software for clinical note transcription with the patient, who gave verbal consent to proceed.  History of Present Illness Christine Scott is an 83 year old female who presents with grief and emotional distress following multiple family deaths.  She has been experiencing significant emotional distress over the past year due to the deaths of three brothers and a sister-in-law. She finds it particularly difficult to cope with the loss of her youngest brother, which has led her to reflect on her own mortality. She feels sad and wants counseling to help manage her grief. No suicidal thoughts.  Sees psych for meds, but needs counseling  She recently received an SI joint steroid injection for lumbar spondylosis at [facility] this morning. The injection site is currently sore, and she is unsure if there has been any improvement in her symptoms yet. She has a history of receiving Prolia  injections every six months, with the next dose due in October.  Her blood pressure was elevated earlier today, but it has since normalized. She mentions a history of high blood pressure readings in stressful situations.  She is currently under the care of Dr. Geoffry for medication management but is seeking counseling services for emotional support. She prefers to see a female counselor and is open to in-person visits.    Health Maintenance Due  Topic Date Due   Influenza Vaccine  07/26/2024   COVID-19 Vaccine (8 - Pfizer risk 2024-25 season) 08/26/2024    Past Medical History:  Diagnosis Date   Abnormal Pap smear of cervix    --hx abnormal paps in her 30s and per patient this was reason for her Hysterectomy   alcohol     Long hx of alcohol   abuse since husband passed in 2015.   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 Trial of PT/injection. If doesn't do well good candidate for reverse shoulder arthoplasty. Continue with PT (12/19). No specific f/u was scheduled.    Osteoarthritis    hands   Osteoporosis    Polymyalgia rheumatica (HCC)     Past Surgical History:  Procedure Laterality Date   ANTERIOR CERVICAL DECOMP/DISCECTOMY FUSION N/A 01/30/2024   Procedure: ANTERIOR CERVICAL DECOMPRESSION AND FUSION CERVICAL THREE-CERVICAL FOUR/CERVICAL FOUR-CERVICAL FIVE;  Surgeon: Louis Shove, MD;  Location: [facility];  Service: Neurosurgery;  Laterality: N/A;  3C   BREAST SURGERY  2016   benign mass removed in [place], [place].   COLONOSCOPY     FACIAL COSMETIC SURGERY     REVERSE SHOULDER ARTHROPLASTY Left 04/16/2020   Procedure: REVERSE SHOULDER ARTHROPLASTY;  Surgeon: Melita Drivers, MD;  Location: [facility];  Service: Orthopedics;  Laterality: Left;    TOTAL VAGINAL HYSTERECTOMY  age  35   --due to abnormal pap smears per patient     Current Outpatient Medications:    acetaminophen  (TYLENOL ) 500 MG tablet, Take 1,000 mg by mouth at bedtime., Disp: , Rfl:    amLODipine  (NORVASC ) 10 MG tablet, TAKE 1 TABLET BY MOUTH DAILY, Disp: 90 tablet, Rfl: 1   B Complex-C (SUPER B COMPLEX PO), Take 1 tablet by mouth daily., Disp: , Rfl:    Calcium  Carb-Cholecalciferol  (CALCIUM  600 + D PO), Take 1 tablet by mouth daily., Disp: , Rfl:    conjugated estrogens  (PREMARIN ) vaginal cream, Place 1/2 gram per vagina at bedtime two to three times weekly., Disp: 30 g, Rfl: 1   cycloSPORINE  (RESTASIS )  0.05 % ophthalmic emulsion, Place 1 drop into both eyes 2 (two) times daily., Disp: , Rfl:    denosumab  (PROLIA ) 60 MG/ML SOSY injection, Inject 60 mg into the skin every 6 (six) months., Disp: , Rfl:    folic acid  (FOLVITE ) 1 MG tablet, Take 1 mg by mouth daily., Disp: , Rfl:    lamoTRIgine  (LAMICTAL ) 150 MG tablet, Take 1 tablet (150 mg total) by mouth daily., Disp: 90 tablet, Rfl: 3   losartan  (COZAAR ) 25 MG tablet, Take 1 tablet (25 mg total) by mouth daily., Disp: 90 tablet, Rfl: 3   Multiple Vitamins-Minerals (MULTIVITAMIN PO), Take 1 tablet by mouth daily. Centrum silver, Disp: , Rfl:    Omega-3 Fatty Acids (FISH OIL) 1000 MG CAPS, Take 1,000 mg by mouth daily., Disp: , Rfl:   Current Facility-Administered Medications:    denosumab  (PROLIA ) injection 60 mg, 60 mg, Subcutaneous, Once, Wendolyn Jenkins Jansky, MD  No Known Allergies ROS neg/noncontributory except as noted HPI/below      Objective:     BP 138/62   Pulse 66   Temp (!) 97 F (36.1 C) (Temporal)   Resp 16   Ht 5' (1.524 m)   Wt 124 lb 8 oz (56.5 kg)   LMP  (LMP Unknown)   SpO2 98%   BMI 24.31 kg/m  Wt Readings from Last 3 Encounters:  09/02/24 124 lb 8 oz (56.5 kg)  05/22/24 122 lb 11.2 oz (55.7 kg)  04/11/24 123 lb 12.8 oz (56.2 kg)    Physical Exam   Gen: WDWN NAD HEENT: NCAT, conjunctiva not injected, sclera nonicteric EXT:  no edema MSK: no gross abnormalities. cane NEURO: A&O x3.  CN II-XII intact.  PSYCH: normal mood. 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 There is no suicidal ideation. She prefers a female Veterinary surgeon and in-person sessions. Refer to a family counselor for grief  counseling and ensure follow-up with Doctor Cottle for medication management.  Spondylosis of lumbar spine   She recently received an SI joint steroid injection and is experiencing expected post-injection soreness as the medication takes effect.  Hypertension   Her blood pressure was elevated earlier, likely due to anxiety related to medical procedures, but it normalized during the visit.  Osteoporosis on Prolia  therapy   She continues on Prolia  therapy every six months, with the next dose due in October.    Return for as sch 10/17.  Jenkins CHRISTELLA Wendolyn, MD

## 2024-09-02 NOTE — Patient Instructions (Signed)
 It was very nice to see you today!  Referral placed to counseling   PLEASE NOTE:  If you had any lab tests please let us  know if you have not heard back within a few days. You may see your results on MyChart before we have a chance to review them but we will give you a call once they are reviewed by us . If we ordered any referrals today, please let us  know if you have not heard from their office within the next week.   Please try these tips to maintain a healthy lifestyle:  Eat most of your calories during the day when you are active. Eliminate processed foods including packaged sweets (pies, cakes, cookies), reduce intake of potatoes, white bread, white pasta, and white rice. Look for whole grain options, oat flour or almond flour.  Each meal should contain half fruits/vegetables, one quarter protein, and one quarter carbs (no bigger than a computer mouse).  Cut down on sweet beverages. This includes juice, soda, and sweet tea. Also watch fruit intake, though this is a healthier sweet option, it still contains natural sugar! Limit to 3 servings daily.  Drink at least 1 glass of water  with each meal and aim for at least 8 glasses per day  Exercise at least 150 minutes every week.

## 2024-09-03 NOTE — Telephone Encounter (Signed)
 PCP sent this message through teams last night. Okay to order?  [id number]$  due next mo-has appt.  just need to make sure ordered

## 2024-09-12 ENCOUNTER — Other Ambulatory Visit: Payer: Self-pay

## 2024-09-12 ENCOUNTER — Encounter (HOSPITAL_COMMUNITY): Payer: Self-pay

## 2024-09-12 ENCOUNTER — Other Ambulatory Visit (HOSPITAL_COMMUNITY): Payer: Self-pay

## 2024-09-12 MED ORDER — DENOSUMAB-BBDZ 60 MG/ML ~~LOC~~ SOSY
1.0000 mL | PREFILLED_SYRINGE | Freq: Once | SUBCUTANEOUS | 1 refills | Status: DC
  Filled 2024-09-12: qty 1, fill #0

## 2024-09-12 NOTE — Telephone Encounter (Signed)
 Rx sent to [facility] for the new Formulary for Prolia .

## 2024-09-12 NOTE — Addendum Note (Signed)
 Addended by: Jenniferann Stuckert on: 09/12/2024 10:29 AM   Modules accepted: Orders

## 2024-09-13 ENCOUNTER — Telehealth: Payer: Self-pay | Admitting: *Deleted

## 2024-09-13 ENCOUNTER — Other Ambulatory Visit: Payer: Self-pay

## 2024-09-13 NOTE — Telephone Encounter (Signed)
 Left detailed message for Christine Scott to return call to office. Patient stated that she was in the program before that helped her pay for the Prolia , would like to sign up for the program again.

## 2024-09-13 NOTE — Progress Notes (Signed)
 Provider sent in prescription for Jubbonti  with note that says to not fill if copay is over $50, and to change to Prolia  if cheaper. Jubbonti  is not formulary. Prolia  is preferred but copay is $350. Waiting on call back from office.

## 2024-09-13 NOTE — Telephone Encounter (Signed)
 Copied from CRM #[id number]. Topic: Clinical - Medication Question >> Sep 13, 2024  2:55 PM [place] F wrote: Reason for CRM: Tiffany from [facility] called to let provider know that the denosumab -bbdz 60 MG/ML SOSY/Jubbonti  medication that was sent yesterday is not formulary for the patient and the Prolia  is formulary but patients copayment would be $350 for the Prolia  - she wants to know what the provider would like to do. Please call her at [phone]

## 2024-09-16 ENCOUNTER — Other Ambulatory Visit: Payer: Self-pay

## 2024-09-16 ENCOUNTER — Other Ambulatory Visit: Payer: Self-pay | Admitting: *Deleted

## 2024-09-16 DIAGNOSIS — M810 Age-related osteoporosis without current pathological fracture: Secondary | ICD-10-CM

## 2024-09-16 MED ORDER — DENOSUMAB 60 MG/ML ~~LOC~~ SOSY
60.0000 mg | PREFILLED_SYRINGE | Freq: Once | SUBCUTANEOUS | Status: AC
  Administered 2024-10-11: 60 mg via SUBCUTANEOUS

## 2024-09-16 NOTE — Telephone Encounter (Signed)
Left message for Christine Scott to return my call

## 2024-09-16 NOTE — Telephone Encounter (Signed)
 I received your message. I just talked to the patient and she informed me she has a copay card for Prolia . She was driving and asked me to call her back tomorrow evening.   Can you send a new prescription for Prolia  to [facility] pharmacy?   Thanks!

## 2024-09-17 ENCOUNTER — Telehealth: Payer: Self-pay

## 2024-09-17 ENCOUNTER — Other Ambulatory Visit (HOSPITAL_COMMUNITY): Payer: Self-pay

## 2024-09-17 NOTE — Telephone Encounter (Signed)
 SABRA

## 2024-09-17 NOTE — Telephone Encounter (Signed)
 Called insurance at [phone] to initiate PA. Per representative, PA is not required for medical (Buy and Bill). If using pharmacy benefits, prescription must be [facility].

## 2024-09-17 NOTE — Telephone Encounter (Signed)
 Pt ready for scheduling for PROLIA  on or after : 10/14/24  Option# 1: Buy/Bill (Office supplied medication)   Out-of-pocket cost due at time of clinic visit: $525   Number of injection/visits approved: ---   Primary: [id number] Prolia  co-insurance: 30% Admin fee co-insurance: 30%   Secondary: --- Prolia  co-insurance:  Admin fee co-insurance:    Medical Benefit Details: Date Benefits were checked: 04/15/24 Deductible: NO/ Coinsurance: 30%/ Admin Fee: 30%   Prior Auth: N/A PA# Expiration Date:   # of doses approved: ----------------------------------------------------------------------- Option# 2- Med Obtained from pharmacy:   Pharmacy benefit: Copay $350 Admin Fee: 30% (Pay at clinic)   Prior Auth: APPROVED PA# [id number] Expiration Date: 03/13/24-04/12/25  # of doses approved: 2     If patient wants fill through the pharmacy benefit please send prescription to: [facility], and include estimated need by date in rx notes. Pharmacy will ship medication directly to the office.   Patient NOT eligible for Prolia  Copay Card. Copay Card can make patient's cost as little as $25. Link to apply: [email or web address]://[email or web address]   ** This summary of benefits is an estimation of the patient's out-of-pocket cost. Exact cost may very based on individual plan coverage.

## 2024-09-17 NOTE — Telephone Encounter (Signed)
 Prolia  VOB initiated via [email or web address]  Next Prolia  inj DUE: 10/14/24

## 2024-09-18 ENCOUNTER — Other Ambulatory Visit: Payer: Self-pay | Admitting: Pharmacy Technician

## 2024-09-18 ENCOUNTER — Other Ambulatory Visit: Payer: Self-pay | Admitting: *Deleted

## 2024-09-18 ENCOUNTER — Other Ambulatory Visit: Payer: Self-pay

## 2024-09-18 DIAGNOSIS — M810 Age-related osteoporosis without current pathological fracture: Secondary | ICD-10-CM

## 2024-09-18 MED ORDER — DENOSUMAB 60 MG/ML ~~LOC~~ SOSY
60.0000 mg | PREFILLED_SYRINGE | Freq: Once | SUBCUTANEOUS | 0 refills | Status: AC
  Filled 2024-09-18: qty 1, 1d supply, fill #0
  Filled 2024-09-18: qty 1, 90d supply, fill #0

## 2024-09-18 NOTE — Telephone Encounter (Signed)
 We have not received prescription for Prolia . Are you able to send again?

## 2024-09-18 NOTE — Progress Notes (Signed)
 Specialty Pharmacy Initial Fill Coordination Note  Christine Scott is a 83 y.o. female contacted today regarding initial fill of specialty medication(s) Denosumab  (PROLIA )   Patient requested Courier to Provider Office   Delivery date: 09/24/24   Verified address: [facility] [phone]   Medication will be filled on 9/29.   Patient is aware of $0 copayment.

## 2024-09-19 ENCOUNTER — Other Ambulatory Visit: Payer: Self-pay | Admitting: Family Medicine

## 2024-09-19 DIAGNOSIS — Z1231 Encounter for screening mammogram for malignant neoplasm of breast: Secondary | ICD-10-CM

## 2024-09-23 ENCOUNTER — Other Ambulatory Visit (HOSPITAL_COMMUNITY): Payer: Self-pay

## 2024-09-23 ENCOUNTER — Other Ambulatory Visit: Payer: Self-pay

## 2024-09-26 ENCOUNTER — Telehealth: Payer: Self-pay

## 2024-09-26 NOTE — Telephone Encounter (Signed)
 Spoke with patient on how to get COVID and flu vaccines.   Copied from CRM [id number]. Topic: Clinical - Medication Question >> Sep 26, 2024 11:10 AM Willma SAUNDERS wrote: Reason for CRM:  Covid Vaccine Prescription Request Brand: Doesn't have a preference Pharmacy: [facility] [address]  [place], [place] [address] Phone: [phone]  *Patient should verify pharmacy had the vaccine in stock  Patient can be reached at [phone]

## 2024-10-01 NOTE — Telephone Encounter (Signed)
 Pt is scheduled for 10/11/24 to see provider & wants to do injection during that time.

## 2024-10-09 ENCOUNTER — Other Ambulatory Visit (HOSPITAL_BASED_OUTPATIENT_CLINIC_OR_DEPARTMENT_OTHER): Payer: Self-pay

## 2024-10-09 MED ORDER — COMIRNATY 30 MCG/0.3ML IM SUSY
0.3000 mL | PREFILLED_SYRINGE | Freq: Once | INTRAMUSCULAR | 0 refills | Status: AC
  Filled 2024-10-09: qty 0.3, 1d supply, fill #0

## 2024-10-09 MED ORDER — FLUZONE HIGH-DOSE 0.5 ML IM SUSY
0.5000 mL | PREFILLED_SYRINGE | Freq: Once | INTRAMUSCULAR | 0 refills | Status: AC
  Filled 2024-10-09: qty 0.5, 1d supply, fill #0

## 2024-10-11 ENCOUNTER — Ambulatory Visit: Admitting: Family Medicine

## 2024-10-11 ENCOUNTER — Encounter: Payer: Self-pay | Admitting: Family Medicine

## 2024-10-11 VITALS — BP 136/58 | HR 100 | Temp 97.9°F | Ht 60.0 in | Wt 123.0 lb

## 2024-10-11 DIAGNOSIS — E0849 Diabetes mellitus due to underlying condition with other diabetic neurological complication: Secondary | ICD-10-CM

## 2024-10-11 DIAGNOSIS — F3181 Bipolar II disorder: Secondary | ICD-10-CM

## 2024-10-11 DIAGNOSIS — M810 Age-related osteoporosis without current pathological fracture: Secondary | ICD-10-CM

## 2024-10-11 DIAGNOSIS — F4321 Adjustment disorder with depressed mood: Secondary | ICD-10-CM

## 2024-10-11 DIAGNOSIS — I10 Essential (primary) hypertension: Secondary | ICD-10-CM

## 2024-10-11 MED ORDER — AMLODIPINE BESYLATE 10 MG PO TABS: 10.0000 mg | ORAL_TABLET | Freq: Every day | ORAL | 1 refills | Status: DC

## 2024-10-11 NOTE — Patient Instructions (Addendum)
 It was very nice to see you today!  Referral placed to psych again  Call and see Dr. Louis about the spine   PLEASE NOTE:  If you had any lab tests please let us  know if you have not heard back within a few days. You may see your results on MyChart before we have a chance to review them but we will give you a call once they are reviewed by us . If we ordered any referrals today, please let us  know if you have not heard from their office within the next week.   Please try these tips to maintain a healthy lifestyle:  Eat most of your calories during the day when you are active. Eliminate processed foods including packaged sweets (pies, cakes, cookies), reduce intake of potatoes, white bread, white pasta, and white rice. Look for whole grain options, oat flour or almond flour.  Each meal should contain half fruits/vegetables, one quarter protein, and one quarter carbs (no bigger than a computer mouse).  Cut down on sweet beverages. This includes juice, soda, and sweet tea. Also watch fruit intake, though this is a healthier sweet option, it still contains natural sugar! Limit to 3 servings daily.  Drink at least 1 glass of water  with each meal and aim for at least 8 glasses per day  Exercise at least 150 minutes every week.

## 2024-10-11 NOTE — Progress Notes (Signed)
 Subjective:     Patient ID: Christine Scott, female    DOB: October 10, 1941, 83 y.o.   MRN: [id number]  Chief Complaint  Patient presents with   Medical Management of Chronic Issues    Follow up; Prolia  injection    Discussed the use of AI scribe software for clinical note transcription with the patient, who gave verbal consent to proceed.  History of Present Illness Christine Scott is an 83 year old female who presents for a follow-up visit.  She is managing osteoporosis with Prolia  injections every six months and is enrolled in a program to help manage the cost. She is enrolled in a program to help manage the cost and does not face high copays.  For hypertension, she takes amlodipine  10 mg and losartan  25 mg. Home blood pressure readings are around 120-122 mmHg. No major headaches, dizziness, syncope, chest pain, or dyspnea.  She is experiencing emotional distress following the deaths of her brothers, with three passing away last year. She feels sad and wants to speak with a psychologist. She denies suicidal thoughts and has stopped drinking alcohol , which she previously used to cope with grief.  She recently had an eye checkup where a small spot of blood was found on her left eye. The ophthalmologist suggested it might be related to her blood pressure or medication, but she notes her blood pressure is well-controlled. She is awaiting a new medication from a special pharmacy as an alternative to Restasis .  She reports pain between her shoulder blades, exacerbated by standing and walking but relieved when seated. She wonders if this is related to her PMR or arthritis. She recalls having injections at [facility] for back pain, which were helpful, but notes the current pain is in a different location.  Her past medical history includes PMR, for which she was previously on prednisone  and methotrexate . She stopped these medications about a year ago. She recalls being prescribed Plaquenil  but does not  remember taking it for long.  She has an upcoming mammogram scheduled for November 10th and has completed her flu vaccination.    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 Trial of PT/injection. If doesn't do well good candidate for reverse shoulder arthoplasty. Continue with PT (12/19). No specific f/u was scheduled.    Osteoarthritis    hands   Osteoporosis    Polymyalgia rheumatica     Past Surgical History:  Procedure Laterality Date   ANTERIOR CERVICAL DECOMP/DISCECTOMY FUSION N/A 01/30/2024   Procedure: ANTERIOR CERVICAL DECOMPRESSION AND FUSION CERVICAL THREE-CERVICAL FOUR/CERVICAL FOUR-CERVICAL FIVE;  Surgeon: Louis Shove, MD;  Location: [facility];  Service: Neurosurgery;  Laterality: N/A;  3C   BREAST  SURGERY  2016   benign mass removed in [place], [place].   COLONOSCOPY     FACIAL COSMETIC SURGERY     REVERSE SHOULDER ARTHROPLASTY Left 04/16/2020   Procedure: REVERSE SHOULDER ARTHROPLASTY;  Surgeon: Melita Drivers, MD;  Location: [facility];  Service: Orthopedics;  Laterality: Left;    TOTAL VAGINAL HYSTERECTOMY  age 32   --due to abnormal pap smears per patient     Current Outpatient Medications:    acetaminophen  (TYLENOL ) 500 MG tablet, Take 1,000 mg by mouth at bedtime., Disp: , Rfl:    B Complex-C (SUPER B COMPLEX PO), Take 1  tablet by mouth daily., Disp: , Rfl:    Calcium  Carb-Cholecalciferol  (CALCIUM  600 + D PO), Take 1 tablet by mouth daily., Disp: , Rfl:    conjugated estrogens  (PREMARIN ) vaginal cream, Place 1/2 gram per vagina at bedtime two to three times weekly., Disp: 30 g, Rfl: 1   cycloSPORINE  (RESTASIS ) 0.05 % ophthalmic emulsion, Place 1 drop into both eyes 2 (two) times daily., Disp: , Rfl:    denosumab  (PROLIA ) 60 MG/ML SOSY injection, Inject 60 mg into the skin every 6 (six) months., Disp: , Rfl:    folic acid  (FOLVITE ) 1 MG tablet, Take 1 mg by mouth daily., Disp: , Rfl:    lamoTRIgine  (LAMICTAL ) 150 MG tablet, Take 1 tablet (150 mg total) by mouth daily., Disp: 90 tablet, Rfl: 3   Multiple Vitamins-Minerals (MULTIVITAMIN PO), Take 1 tablet by mouth daily. Centrum silver, Disp: , Rfl:    Omega-3 Fatty Acids (FISH OIL) 1000 MG CAPS, Take 1,000 mg by mouth daily., Disp: , Rfl:    amLODipine  (NORVASC ) 10 MG tablet, Take 1 tablet (10 mg total) by mouth daily., Disp: 90 tablet, Rfl: 1   losartan  (COZAAR ) 25 MG tablet, Take 1 tablet (25 mg total) by mouth daily., Disp: 90 tablet, Rfl: 1  Current Facility-Administered Medications:    denosumab  (PROLIA ) injection 60 mg, 60 mg, Subcutaneous, Once, Wendolyn Jenkins Jansky, MD  No Known Allergies ROS neg/noncontributory except as noted HPI/below      Objective:     BP (!) 136/58   Pulse 100   Temp 97.9 F (36.6 C)   Ht 5' (1.524 m)   Wt 123 lb (55.8 kg)   LMP  (LMP Unknown)   SpO2 98%   BMI 24.02 kg/m  Wt Readings from Last 3 Encounters:  10/11/24 123 lb (55.8 kg)  09/02/24 124 lb 8 oz (56.5 kg)  05/22/24 122 lb 11.2 oz (55.7 kg)    Physical Exam GENERAL: Well developed, well nourished, no acute distress. HEAD EYES EARS NOSE THROAT: Normocephalic, atraumatic, conjunctiva not injected, sclera nonicteric. CARDIAC: Regular rate and rhythm, S1 S2 present, no murmur, dorsalis pedis 2 plus bilaterally. NECK: Supple, no thyromegaly, no nodes, no carotid  bruits. LUNGS: Clear to auscultation bilaterally, no wheezes. ABDOMEN: Bowel sounds present, stomach growling, soft, non-tender, non-distended, no hepatosplenomegaly, no masses. EXTREMITIES: No edema. MUSCULOSKELETAL: No gross abnormalities. NEUROLOGICAL: Alert and oriented x3, cranial nerves II through XII intact. PSYCHIATRIC: Normal mood, good eye contact.       Assessment & Plan:  Essential hypertension -     CBC with Differential/Platelet -     Comprehensive metabolic panel with GFR -     amLODIPine  Besylate; Take 1 tablet (10 mg total) by mouth daily.  Dispense: 90 tablet; Refill: 1 -     Losartan  Potassium; Take 1 tablet (25 mg total) by mouth daily.  Dispense: 90 tablet; Refill: 1  Age-related osteoporosis without current pathological fracture  Bipolar II disorder (HCC)  Grief -     Ambulatory referral to Psychology    Assessment and Plan Assessment & Plan Depression with recent bereavement   She is experiencing sadness and depressive symptoms after losing three brothers. She is not suicidal but seeks in-person psychological support. A previous referral was closed as she felt fine at the time. Refer her to a psychologist for in-person counseling and follow up if there is no contact from the psychologist in two weeks.  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 Previous injections at [facility] provided relief but did not target the current pain area. Refer her to Dr. Malcolm for spine evaluation and potential interventions.  Polymyalgia rheumatica   She has polymyalgia rheumatica, previously treated with prednisone , methotrexate , and a brief course of hydroxychloroquine . Currently, she is not on any immunosuppressive therapy. She reports pain between the shoulder blades, possibly related to arthritis.  Osteoporosis   Osteoporosis is managed with Prolia  injections.  She is on a Prolia  program that facilitates medication administration without a high copay. The next injection is due in six months. Continue Prolia  injections every six months.  Hypertension   Hypertension is well controlled with amlodipine  10 mg and losartan  25 mg. Home blood pressure readings are around 120/80 mmHg, within the target range. She reports no symptoms of headache, dizziness, or chest pain. Continue amlodipine  10 mg daily and losartan  25 mg daily. Renew the amlodipine  prescription at [facility] pharmacy.     No follow-ups on file.  Jenkins CHRISTELLA Carrel, MD

## 2024-10-12 LAB — COMPREHENSIVE METABOLIC PANEL WITH GFR
AG Ratio: 1.4 (calc) (ref 1.0–2.5)
ALT: 7 U/L (ref 6–29)
AST: 19 U/L (ref 10–35)
Albumin: 4.2 g/dL (ref 3.6–5.1)
Alkaline phosphatase (APISO): 82 U/L (ref 37–153)
BUN: 20 mg/dL (ref 7–25)
CO2: 22 mmol/L (ref 20–32)
Calcium: 10.4 mg/dL (ref 8.6–10.4)
Chloride: 101 mmol/L (ref 98–110)
Creat: 0.93 mg/dL (ref 0.60–0.95)
Globulin: 3 g/dL (ref 1.9–3.7)
Glucose, Bld: 89 mg/dL (ref 65–99)
Potassium: 4.6 mmol/L (ref 3.5–5.3)
Sodium: 137 mmol/L (ref 135–146)
Total Bilirubin: 0.3 mg/dL (ref 0.2–1.2)
Total Protein: 7.2 g/dL (ref 6.1–8.1)
eGFR: 61 mL/min/1.73m2 (ref >=60)

## 2024-10-13 DIAGNOSIS — E0849 Diabetes mellitus due to underlying condition with other diabetic neurological complication: Secondary | ICD-10-CM | POA: Insufficient documentation

## 2024-10-15 ENCOUNTER — Other Ambulatory Visit (HOSPITAL_COMMUNITY): Payer: Self-pay | Admitting: Internal Medicine

## 2024-10-15 ENCOUNTER — Other Ambulatory Visit (INDEPENDENT_AMBULATORY_CARE_PROVIDER_SITE_OTHER)

## 2024-10-15 DIAGNOSIS — I10 Essential (primary) hypertension: Secondary | ICD-10-CM

## 2024-10-15 NOTE — Addendum Note (Signed)
 Addended by: WENDOLYN JENKINS HERO on: 10/15/2024 12:56 PM   Modules accepted: Orders

## 2024-10-17 ENCOUNTER — Ambulatory Visit: Payer: Self-pay | Admitting: Family Medicine

## 2024-10-17 LAB — CBC WITH DIFFERENTIAL/PLATELET
Absolute Lymphocytes: 1240 {cells}/uL (ref 850–3900)
Absolute Monocytes: 465 {cells}/uL (ref 200–950)
Basophils Absolute: 30 {cells}/uL (ref 0–200)
Basophils Relative: 0.6 %
Eosinophils Absolute: 30 {cells}/uL (ref 15–500)
Eosinophils Relative: 0.6 %
HCT: 33.8 % — ABNORMAL LOW (ref 35.0–45.0)
Hemoglobin: 11 g/dL — ABNORMAL LOW (ref 11.7–15.5)
MCH: 30.2 pg (ref 27.0–33.0)
MCHC: 32.5 g/dL (ref 32.0–36.0)
MCV: 92.9 fL (ref 80.0–100.0)
MPV: 10.8 fL (ref 7.5–12.5)
Monocytes Relative: 9.3 %
Neutro Abs: 3235 {cells}/uL (ref 1500–7800)
Neutrophils Relative %: 64.7 %
Platelets: 231 Thousand/uL (ref 140–400)
RBC: 3.64 Million/uL — ABNORMAL LOW (ref 3.80–5.10)
RDW: 12.7 % (ref 11.0–15.0)
Total Lymphocyte: 24.8 %
WBC: 5 Thousand/uL (ref 3.8–10.8)

## 2024-10-17 LAB — HOUSE ACCOUNT TRACKING

## 2024-10-17 NOTE — Progress Notes (Signed)
 Kidneys/liver fine.  Cbc-still trying to track it down

## 2024-10-21 LAB — CBC WITH DIFFERENTIAL/PLATELET

## 2024-11-04 ENCOUNTER — Ambulatory Visit
Admission: RE | Admit: 2024-11-04 | Discharge: 2024-11-04 | Disposition: A | Source: Ambulatory Visit | Attending: Family Medicine | Admitting: Family Medicine

## 2024-11-04 DIAGNOSIS — Z1231 Encounter for screening mammogram for malignant neoplasm of breast: Secondary | ICD-10-CM

## 2024-11-05 ENCOUNTER — Ambulatory Visit: Payer: Self-pay | Admitting: Family Medicine

## 2024-11-06 ENCOUNTER — Ambulatory Visit: Payer: Self-pay | Admitting: Family Medicine

## 2024-11-11 ENCOUNTER — Encounter: Payer: Self-pay | Admitting: Obstetrics and Gynecology

## 2024-11-11 ENCOUNTER — Ambulatory Visit (INDEPENDENT_AMBULATORY_CARE_PROVIDER_SITE_OTHER): Admitting: Obstetrics and Gynecology

## 2024-11-11 VITALS — BP 126/80 | HR 88 | Ht 62.0 in | Wt 123.0 lb

## 2024-11-11 DIAGNOSIS — N952 Postmenopausal atrophic vaginitis: Secondary | ICD-10-CM

## 2024-11-11 DIAGNOSIS — Z01419 Encounter for gynecological examination (general) (routine) without abnormal findings: Secondary | ICD-10-CM

## 2024-11-11 DIAGNOSIS — Z5181 Encounter for therapeutic drug level monitoring: Secondary | ICD-10-CM | POA: Diagnosis not present

## 2024-11-11 DIAGNOSIS — Z9189 Other specified personal risk factors, not elsewhere classified: Secondary | ICD-10-CM | POA: Diagnosis not present

## 2024-11-11 DIAGNOSIS — B009 Herpesviral infection, unspecified: Secondary | ICD-10-CM

## 2024-11-11 MED ORDER — PREMARIN 0.625 MG/GM VA CREA: TOPICAL_CREAM | VAGINAL | 2 refills | Status: DC

## 2024-11-11 NOTE — Progress Notes (Unsigned)
 83 y.o. G54P2004 Widowed Caucasian female here for a breast and pelvic exam.    The patient is also followed for vaginal atrophy and use of vaginal estrogen cream. Using vaginal cream twice a week.    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    Will go to [facility] for injections for sacroiliitis.   Taking Prolia  for osteoporosis.   PCP: Wendolyn Jenkins Jansky, MD   No LMP recorded (lmp unknown). Patient has had a hysterectomy.           Sexually active: No.  The current method of family planning is status post hysterectomy and post menopausal status.    Menopausal hormone therapy:  Premarin   Exercising: Yes.    Walking  Smoker:  no  OB History     Gravida  2   Para  2   Term  2   Preterm      AB      Living  4      SAB      IAB      Ectopic      Multiple      Live Births  2           HEALTH MAINTENANCE: Last 2 paps: 04/27/18 neg, 04/13/16 neg  History of abnormal Pap or positive HPV:  yes, in her 30s  Mammogram:  11/04/24 Breast Density Cat B, BIRADS Cat 1 neg  Colonoscopy:  2022 per pt  Bone Density:  10/17/23  Result  osteoporotic    Immunization History  Administered Date(s) Administered   Fluad  Quad(high Dose 65+) 08/28/2019, 11/24/2020, 09/09/2021   Fluad  Trivalent(High Dose 65+) 09/22/2023   Fluzone Influenza virus vaccine,trivalent (IIV3), split virus 11/28/2011, 12/03/2012, 08/24/2016, 10/21/2017   INFLUENZA, HIGH DOSE SEASONAL PF 10/21/2017, 11/27/2018, 09/15/2022, 10/09/2024   Influenza-Unspecified 10/13/2016   PFIZER Comirnaty (Gray Top)Covid-19 Tri-Sucrose Vaccine 05/31/2021   PFIZER(Purple Top)SARS-COV-2 Vaccination 01/12/2020, 02/05/2020, 11/07/2020   Pfizer Covid-19 Vaccine Bivalent Booster 17yrs & up 11/24/2021   Pfizer(Comirnaty )Fall Seasonal Vaccine 12 years and older 10/07/2022, 09/22/2023, 10/09/2024   Pneumococcal Conjugate-13 04/30/2005, 08/17/2018   Pneumococcal Polysaccharide-23 02/17/2010, 01/19/2016    Respiratory Syncytial Virus Vaccine ,Recomb Aduvanted(Arexvy ) 09/13/2022   Td (Adult) 12/27/2003   Tdap 12/26/2009, 10/18/2021   Zoster Recombinant(Shingrix ) 10/18/2021, 09/09/2022   Zoster, Live 06/10/2008      reports that she has never smoked. She has never been exposed to tobacco smoke. She has never used smokeless tobacco. She reports current alcohol  use of about 2.0 standard drinks of alcohol  per week. She reports that she does not use drugs.  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 Trial of PT/injection. If doesn't do well good candidate for reverse shoulder arthoplasty. Continue with PT (12/19). No specific f/u  was scheduled.    Osteoarthritis    hands   Osteoporosis    Polymyalgia rheumatica     Past Surgical History:  Procedure Laterality Date   ANTERIOR CERVICAL DECOMP/DISCECTOMY FUSION N/A 01/30/2024   Procedure: ANTERIOR CERVICAL DECOMPRESSION AND FUSION CERVICAL THREE-CERVICAL FOUR/CERVICAL FOUR-CERVICAL FIVE;  Surgeon: Louis Shove, MD;  Location: [facility] OR;  Service: Neurosurgery;  Laterality: N/A;  3C   BREAST SURGERY  2016   benign mass removed in [place], [place].   COLONOSCOPY     FACIAL COSMETIC SURGERY     REVERSE SHOULDER ARTHROPLASTY Left 04/16/2020   Procedure: REVERSE SHOULDER ARTHROPLASTY;  Surgeon: Melita Drivers, MD;  Location: [facility];  Service: Orthopedics;  Laterality: Left;     TOTAL VAGINAL HYSTERECTOMY  age 16   --due to abnormal pap smears per patient    Current Outpatient Medications  Medication Sig Dispense Refill   acetaminophen  (TYLENOL ) 500 MG tablet Take 1,000 mg by mouth at bedtime.     amLODipine  (NORVASC ) 10 MG tablet Take 1 tablet (10 mg total) by mouth daily. 90 tablet 1   B Complex-C (SUPER B COMPLEX PO) Take 1 tablet by mouth daily.     Calcium  Carb-Cholecalciferol  (CALCIUM  600 + D PO) Take 1 tablet by mouth daily.     conjugated estrogens  (PREMARIN ) vaginal cream Place 1/2 gram per vagina at bedtime two to three times weekly. 30 g 1   cycloSPORINE  (RESTASIS ) 0.05 % ophthalmic emulsion Place 1 drop into both eyes 2 (two) times daily.     denosumab  (PROLIA ) 60 MG/ML SOSY injection Inject 60 mg into the skin every 6 (six) months.     folic acid  (FOLVITE ) 1 MG tablet Take 1 mg by mouth daily.     lamoTRIgine  (LAMICTAL ) 150 MG tablet Take 1 tablet (150 mg total) by mouth daily. 90 tablet 3   losartan  (COZAAR ) 25 MG tablet Take 1 tablet (25 mg total) by mouth daily. 90 tablet 1   Multiple Vitamins-Minerals (MULTIVITAMIN PO) Take 1 tablet by mouth daily. Centrum silver     Omega-3 Fatty Acids (FISH OIL) 1000 MG CAPS Take 1,000 mg by mouth daily.     Current Facility-Administered Medications  Medication Dose Route Frequency Provider Last Rate Last Admin   denosumab  (PROLIA ) injection 60 mg  60 mg Subcutaneous Once Kulik, Ann Marie, MD        ALLERGIES: Patient has no known allergies.  Family History  Problem Relation Age of Onset   Congestive Heart Failure Mother    Stroke Mother    Diabetes Father    Colon cancer Brother    Diabetes Brother    Diabetes Brother    Stroke Brother    Heart attack Brother    Colon cancer Daughter    BRCA 1/2 Neg Hx    Breast cancer Neg Hx     Review of Systems  All other systems reviewed and are negative.   PHYSICAL EXAM:  BP 126/80 (BP Location: Left Arm, Patient Position: Sitting)   Pulse 88   Ht 5'  2 (1.575 m)   Wt 123 lb (55.8 kg)   LMP  (LMP Unknown)   SpO2 97%   BMI 22.50 kg/m     General appearance: alert, cooperative and appears stated age Head: normocephalic, without obvious abnormality, atraumatic Neck: no adenopathy, supple, symmetrical, trachea midline and thyroid normal to inspection and palpation Lungs: clear to auscultation bilaterally Breasts: normal appearance, no masses or tenderness, No nipple retraction or dimpling, No nipple  discharge or bleeding, No axillary adenopathy Heart: regular rate and rhythm Abdomen: soft, non-tender; no masses, no organomegaly Extremities: extremities normal, atraumatic, no cyanosis or edema Skin: skin color, texture, turgor normal. No rashes or lesions Lymph nodes: cervical, supraclavicular, and axillary nodes normal. Neurologic: grossly normal  Pelvic: External genitalia:  no lesions              No abnormal inguinal nodes palpated.              Urethra:  normal appearing urethra with no masses, tenderness or lesions              Bartholins and Skenes: normal                 Vagina: normal appearing vagina with normal color and discharge, atrophy noted.               Cervix: absent              Pap taken: no Bimanual Exam:  Uterus:  absent              Adnexa: no mass, fullness, tenderness              Rectal exam: yes.  Confirms.              Anus:  normal sphincter tone, no lesions  Chaperone was present for exam:  Kari HERO, CMA  ASSESSMENT: Encounter for breast and pelvic exam.  Status post TVH for abnormal paps in her 30s.  Ovaries remain.  Vaginal atrophy.  Encounter for medication monitoring. Osteoporosis.  On Prolia  through PCP.  FH colon cancer in daughter and her brother. Hx HSV I.  PLAN: Mammogram screening discussed. Self breast awareness reviewed. Pap and HRV collected:  no. Not indicated.  Guidelines for Calcium , Vitamin D , regular exercise program including cardiovascular and weight bearing  exercise. Medication refills:  Premarin  vaginal cream, increase to 1 gram per vagina and place some on the introitus as well, twice week at bedtime.  I discussed potential effect on breast cancer.  Follow up:  yearly and prn.     Additional counseling given.  yes. ***  total time was spent for this patient encounter, including preparation, face-to-face counseling with the patient, coordination of care, and documentation of the encounter in addition to doing the breast and pelvic exam.

## 2024-11-11 NOTE — Patient Instructions (Signed)

## 2024-11-13 ENCOUNTER — Telehealth: Payer: Self-pay | Admitting: *Deleted

## 2024-11-13 MED ORDER — ESTRADIOL 0.01 % VA CREA: TOPICAL_CREAM | VAGINAL | 3 refills | Status: AC

## 2024-11-13 NOTE — Telephone Encounter (Signed)
 Patient left message on triage line, seen for B&P exam 11/11/24. Rx sent for Premarin  vaginal cream, would like alternative Rx due to [facility] cost of $200. Patient states alternative was discussed as possibility if medication cost was too much.   OV notes reviewed.   Dr. Nikki -please advise on alternative. Pharmacy updated.

## 2024-11-13 NOTE — Telephone Encounter (Signed)
 Prescription sent in for vaginal estradiol  cream, which is comparative.   If her insurance does not cover this, please provide list of alternative medications.

## 2024-11-14 NOTE — Telephone Encounter (Signed)
Spoke with patient, advised as seen below per Dr. Edward Jolly.  Patient verbalizes understanding and is agreeable.  Encounter closed.

## 2024-11-20 ENCOUNTER — Ambulatory Visit: Admitting: Behavioral Health

## 2024-11-20 DIAGNOSIS — F3181 Bipolar II disorder: Secondary | ICD-10-CM

## 2024-11-20 DIAGNOSIS — F411 Generalized anxiety disorder: Secondary | ICD-10-CM | POA: Diagnosis not present

## 2024-11-20 DIAGNOSIS — F1011 Alcohol abuse, in remission: Secondary | ICD-10-CM | POA: Diagnosis not present

## 2024-11-23 ENCOUNTER — Other Ambulatory Visit: Payer: Self-pay | Admitting: Family Medicine

## 2024-11-23 DIAGNOSIS — I10 Essential (primary) hypertension: Secondary | ICD-10-CM

## 2024-12-03 ENCOUNTER — Telehealth: Payer: Self-pay

## 2024-12-03 NOTE — Telephone Encounter (Signed)
 Please Advise   Copied from CRM #[id number]. Topic: Clinical - Medical Advice >> Dec 03, 2024 10:22 AM Burnard DEL wrote: Reason for CRM: Patient would like to know what she could get OTC for symptoms of,sore throat,cough,headache,runny nose,nausea and body aches. She stated that she think she may have covid but she doesn't want to come out in the cold. She have some home test however she thinks they are outdated? Please advise.

## 2024-12-11 ENCOUNTER — Ambulatory Visit: Admitting: Behavioral Health

## 2024-12-12 ENCOUNTER — Other Ambulatory Visit: Payer: Self-pay

## 2025-01-02 NOTE — Progress Notes (Signed)
 Photographer Health Counselor Initial Adult Exam  Name: Christine Scott Date: 11/20/2024 MRN: [id number] DOB: [birth year] PCP: Wendolyn Jenkins Jansky, MD  Time spent: 60 min In Person @ [facility] Time In: 1:00pm Time Out: 2:00pm  Guardian/Payee:  [employer]    Paperwork requested: No   Reason for Visit /Presenting Problem: Grief rxn due to death of 4 Bros & a SIL since 15-Jan-2008; Pt wants someone to talk w/and work through her Hx of grief & loss to gain acceptance & move forward.   Mental Status Exam: Appearance:   Casual and Neat     Behavior:  Appropriate and Sharing  Motor:  Normal  Speech/Language:   Clear and Coherent  Affect:  Appropriate  Mood:  normal  Thought process:  normal  Thought content:    WNL  Sensory/Perceptual disturbances:    WNL  Orientation:  oriented to person, place, time/date, and situation  Attention:  Good  Concentration:  Good  Memory:  WNL  Fund of knowledge:   Good  Insight:    Good  Judgment:   Good  Impulse Control:  Fair    Risk Assessment: Danger to Self:  No Self-injurious Behavior: No Danger to Others: No Duty to Warn:no Physical Aggression / Violence:No  Access to Firearms a concern: No  Gang Involvement:No  Patient / guardian was educated about steps to take if suicide or homicide risk level increases between visits: yes; appropriate to ICD process While future psychiatric events cannot be accurately predicted, the patient does not currently require acute inpatient psychiatric care and does not currently meet [facility]  involuntary commitment criteria.  Substance Abuse History: Current substance abuse: No     Past Psychiatric History:   Dx of Bipolar II d/o & Hx of Dep Outpatient Providers: Jenkins Wendolyn, MD & Rodgers Macintosh, MD History of Psych Hospitalization: Unk Psychological Testing: Unk   Abuse History:  Victim of: No., NA   Report needed: No. Victim of Neglect:No. Perpetrator of NA  Witness / Exposure to  Domestic Violence: No   Protective Services Involvement: No  Witness to [employer] Violence:  No   Family History:  Family History  Problem Relation Age of Onset   Congestive Heart Failure Mother    Stroke Mother    Diabetes Father    Colon cancer Brother    Diabetes Brother    Diabetes Brother    Stroke Brother    Heart attack Brother    Colon cancer Daughter    BRCA 1/2 Neg Hx    Breast cancer Neg Hx     Living situation: the patient lives alone  Sexual Orientation: Straight  Relationship Status: widowed from [place] of 85 yrs, Pt has current Sig Other named Don Name of spouse / other: Todd If a parent, number of children / ages:85yo Beth who lives in [place] & has 3 children. Dtr Rosaline who is 85yo & lives in [place] w/her 2 dogs.  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   Educational History: Education: college graduate  Religion/Sprituality/World View: Unk  Any cultural differences that may affect / interfere with treatment:  None noted today  Recreation/Hobbies: Loves movies & watching TV  Stressors: Health problems   Loss of multiple Siblings   Other: Pt wants to understand & accept the deaths of her Brothers   Pt is concerned for her consumption of  wine & realizes this is a problem  Strengths: Supportive Relationships, Family, Friends, Self Advocate, Able to Communicate Effectively, and Pt has taken steps for herself to reach out for help w/her PCP's guidance.   Barriers:  None noted today; Pt is receptive to psychotherapy & wants to talk   Legal History: Pending legal issue / charges: The patient has no significant history of legal issues. History of legal issue / charges: NA  Medical History/Surgical History: reviewed Past Medical History:  Diagnosis Date   Abnormal Pap smear of cervix    --hx abnormal paps in her 30s  and per patient this was reason for her Hysterectomy   alcohol     Long hx of alcohol  abuse since husband passed in 2015.   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 Trial of PT/injection. If doesn't do well good candidate for reverse shoulder arthoplasty. Continue with PT (12/19). No specific f/u was scheduled.    Osteoarthritis    hands   Osteoporosis    Polymyalgia rheumatica     Past Surgical History:  Procedure Laterality Date   ANTERIOR CERVICAL DECOMP/DISCECTOMY FUSION N/A 01/30/2024   Procedure: ANTERIOR CERVICAL DECOMPRESSION AND FUSION CERVICAL THREE-CERVICAL FOUR/CERVICAL FOUR-CERVICAL FIVE;  Surgeon: Louis Shove, MD;  Location: [facility] OR;  Service: Neurosurgery;  Laterality: N/A;  3C   BREAST SURGERY  2016   benign mass removed in [place], [place].   COLONOSCOPY     FACIAL COSMETIC SURGERY     REVERSE SHOULDER ARTHROPLASTY Left 04/16/2020   Procedure: REVERSE SHOULDER ARTHROPLASTY;  Surgeon: Melita Drivers, MD;  Location: [facility];  Service: Orthopedics;  Laterality: Left;    TOTAL VAGINAL HYSTERECTOMY  age 84   --due to abnormal pap smears per patient    Medications: Current Outpatient Medications  Medication Sig Dispense Refill   acetaminophen  (TYLENOL ) 500 MG tablet Take 1,000 mg by mouth at bedtime.     amLODipine  (NORVASC ) 10 MG tablet TAKE 1 TABLET BY MOUTH DAILY 90 tablet 1   B Complex-C (SUPER B COMPLEX PO) Take 1 tablet by mouth daily.     Calcium  Carb-Cholecalciferol  (CALCIUM  600 + D PO) Take 1 tablet by mouth daily.     cycloSPORINE  (RESTASIS ) 0.05 % ophthalmic emulsion Place 1 drop into both eyes 2 (two) times  daily.     denosumab  (PROLIA ) 60 MG/ML SOSY injection Inject 60 mg into the skin every 6 (six) months.     estradiol  (ESTRACE ) 0.01 % CREA vaginal cream Use 1/2 g vaginally every night for the first 2 weeks, then use 1/2 g vaginally two or three times per week as needed to maintain symptom relief. 42.5 g 3   folic acid  (FOLVITE ) 1 MG tablet Take 1 mg by mouth daily.     lamoTRIgine  (LAMICTAL ) 150 MG tablet Take 1 tablet (150 mg total) by mouth daily. 90 tablet 3   losartan  (COZAAR ) 25 MG tablet Take 1 tablet (25 mg total) by mouth daily. 90 tablet 1   Multiple Vitamins-Minerals (MULTIVITAMIN PO) Take 1 tablet by mouth daily.  Centrum silver     Omega-3 Fatty Acids (FISH OIL) 1000 MG CAPS Take 1,000 mg by mouth daily.     Current Facility-Administered Medications  Medication Dose Route Frequency Provider Last Rate Last Admin   denosumab  (PROLIA ) injection 60 mg  60 mg Subcutaneous Once Kulik, Ann Marie, MD        Allergies[1]  Diagnoses:  Bipolar II disorder (HCC)  Generalized anxiety disorder  Alcohol  use disorder, mild, in early remission  Plan of Care: Emmalou is a pleasant lady who is struggling to deal w/aging issues re: her health status changes & her grief over death of Brothers since 02-03-08 is predominant. She would like to work through these deaths & better undestand what happened so she can accept the circumstances & the impact of the losses on her mental health. Nyeemah also recognizes her need for support to gain control over her consumption of wine as one glass is never enough.   Target Date: 01/24/2025  Progress: 5  Frequency: Once every 2-3 wks  Modality; Kennis Richerd LITTIE Hollace, LMFT        [1] No Known Allergies  "

## 2025-02-05 ENCOUNTER — Ambulatory Visit: Admitting: Behavioral Health

## 2025-03-19 ENCOUNTER — Ambulatory Visit: Admitting: Psychiatry

## 2025-04-16 ENCOUNTER — Ambulatory Visit: Admitting: Family Medicine

## 2025-11-12 ENCOUNTER — Encounter: Admitting: Obstetrics and Gynecology
